# Patient Record
Sex: Female | Born: 1985 | Race: White | Hispanic: No | Marital: Single | Smoking: Current every day smoker
Health system: Southern US, Community
[De-identification: ages and names within clinical notes are randomized; demographics above are authoritative.]

## PROBLEM LIST (undated history)

## (undated) ENCOUNTER — Inpatient Hospital Stay (HOSPITAL_COMMUNITY): Payer: Self-pay

## (undated) DIAGNOSIS — M549 Dorsalgia, unspecified: Secondary | ICD-10-CM

## (undated) DIAGNOSIS — F419 Anxiety disorder, unspecified: Secondary | ICD-10-CM

## (undated) DIAGNOSIS — K2270 Barrett's esophagus without dysplasia: Secondary | ICD-10-CM

## (undated) DIAGNOSIS — E0500 Thyrotoxicosis with diffuse goiter without thyrotoxic crisis or storm: Secondary | ICD-10-CM

## (undated) DIAGNOSIS — F988 Other specified behavioral and emotional disorders with onset usually occurring in childhood and adolescence: Secondary | ICD-10-CM

## (undated) DIAGNOSIS — G43909 Migraine, unspecified, not intractable, without status migrainosus: Secondary | ICD-10-CM

## (undated) DIAGNOSIS — Z87442 Personal history of urinary calculi: Secondary | ICD-10-CM

## (undated) DIAGNOSIS — N12 Tubulo-interstitial nephritis, not specified as acute or chronic: Secondary | ICD-10-CM

## (undated) DIAGNOSIS — G8929 Other chronic pain: Secondary | ICD-10-CM

## (undated) DIAGNOSIS — D219 Benign neoplasm of connective and other soft tissue, unspecified: Secondary | ICD-10-CM

## (undated) DIAGNOSIS — F319 Bipolar disorder, unspecified: Secondary | ICD-10-CM

## (undated) DIAGNOSIS — K219 Gastro-esophageal reflux disease without esophagitis: Secondary | ICD-10-CM

## (undated) DIAGNOSIS — N3010 Interstitial cystitis (chronic) without hematuria: Secondary | ICD-10-CM

## (undated) HISTORY — PX: TONSILLECTOMY: SUR1361

## (undated) HISTORY — DX: Gastro-esophageal reflux disease without esophagitis: K21.9

## (undated) HISTORY — DX: Anxiety disorder, unspecified: F41.9

## (undated) HISTORY — DX: Personal history of urinary calculi: Z87.442

## (undated) HISTORY — PX: WISDOM TOOTH EXTRACTION: SHX21

## (undated) HISTORY — PX: TEAR DUCT PROBING: SHX793

## (undated) HISTORY — PX: ADENOIDECTOMY: SUR15

## (undated) HISTORY — DX: Thyrotoxicosis with diffuse goiter without thyrotoxic crisis or storm: E05.00

## (undated) HISTORY — DX: Bipolar disorder, unspecified: F31.9

## (undated) HISTORY — DX: Tubulo-interstitial nephritis, not specified as acute or chronic: N12

---
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
## 2005-04-13 ENCOUNTER — Other Ambulatory Visit: Admission: RE | Admit: 2005-04-13 | Discharge: 2005-04-13 | Payer: Self-pay | Admitting: Obstetrics and Gynecology

## 2005-07-09 ENCOUNTER — Ambulatory Visit (HOSPITAL_COMMUNITY): Admission: RE | Admit: 2005-07-09 | Discharge: 2005-07-09 | Payer: Self-pay | Admitting: *Deleted

## 2006-02-09 ENCOUNTER — Ambulatory Visit: Payer: Self-pay | Admitting: Family Medicine

## 2006-04-07 ENCOUNTER — Ambulatory Visit: Payer: Self-pay | Admitting: Family Medicine

## 2006-07-07 ENCOUNTER — Ambulatory Visit: Payer: Self-pay | Admitting: Family Medicine

## 2006-07-07 LAB — CONVERTED CEMR LAB
ALT: 17 units/L (ref 0–40)
AST: 18 units/L (ref 0–37)
Albumin: 3.7 g/dL (ref 3.5–5.2)
Alkaline Phosphatase: 40 units/L (ref 39–117)
BUN: 7 mg/dL (ref 6–23)
Basophils Absolute: 0 10*3/uL (ref 0.0–0.1)
Basophils Relative: 0.3 % (ref 0.0–1.0)
CO2: 29 meq/L (ref 19–32)
Calcium: 9.2 mg/dL (ref 8.4–10.5)
Chloride: 104 meq/L (ref 96–112)
Creatinine, Ser: 0.9 mg/dL (ref 0.4–1.2)
Eosinophils Absolute: 0.3 10*3/uL (ref 0.0–0.6)
Eosinophils Relative: 3.8 % (ref 0.0–5.0)
GFR calc Af Amer: 103 mL/min
GFR calc non Af Amer: 85 mL/min
Glucose, Bld: 87 mg/dL (ref 70–99)
HCT: 38.2 % (ref 36.0–46.0)
Hemoglobin: 12.8 g/dL (ref 12.0–15.0)
Lymphocytes Relative: 26.6 % (ref 12.0–46.0)
MCHC: 33.6 g/dL (ref 30.0–36.0)
MCV: 83.3 fL (ref 78.0–100.0)
Monocytes Absolute: 0.7 10*3/uL (ref 0.2–0.7)
Monocytes Relative: 8.3 % (ref 3.0–11.0)
Neutro Abs: 5 10*3/uL (ref 1.4–7.7)
Neutrophils Relative %: 61 % (ref 43.0–77.0)
Platelets: 182 10*3/uL (ref 150–400)
Potassium: 4.1 meq/L (ref 3.5–5.1)
RBC: 4.58 M/uL (ref 3.87–5.11)
RDW: 11.9 % (ref 11.5–14.6)
Sodium: 138 meq/L (ref 135–145)
Total Bilirubin: 0.5 mg/dL (ref 0.3–1.2)
Total Protein: 6.6 g/dL (ref 6.0–8.3)
WBC: 8.2 10*3/uL (ref 4.5–10.5)

## 2006-08-04 ENCOUNTER — Encounter: Admission: RE | Admit: 2006-08-04 | Discharge: 2006-08-04 | Payer: Self-pay | Admitting: Family Medicine

## 2006-11-30 ENCOUNTER — Emergency Department (HOSPITAL_COMMUNITY): Admission: EM | Admit: 2006-11-30 | Discharge: 2006-11-30 | Payer: Self-pay | Admitting: Emergency Medicine

## 2006-11-30 ENCOUNTER — Encounter: Payer: Self-pay | Admitting: Family Medicine

## 2006-12-06 ENCOUNTER — Ambulatory Visit: Payer: Self-pay | Admitting: Family Medicine

## 2006-12-06 DIAGNOSIS — H60509 Unspecified acute noninfective otitis externa, unspecified ear: Secondary | ICD-10-CM

## 2006-12-06 DIAGNOSIS — M549 Dorsalgia, unspecified: Secondary | ICD-10-CM | POA: Insufficient documentation

## 2006-12-06 DIAGNOSIS — K219 Gastro-esophageal reflux disease without esophagitis: Secondary | ICD-10-CM

## 2006-12-06 LAB — CONVERTED CEMR LAB
Blood in Urine, dipstick: NEGATIVE
Glucose, Urine, Semiquant: NEGATIVE
Ketones, urine, test strip: NEGATIVE
Nitrite: NEGATIVE
Protein, U semiquant: NEGATIVE
Specific Gravity, Urine: 1.02
WBC Urine, dipstick: NEGATIVE
pH: 8

## 2006-12-07 ENCOUNTER — Encounter: Payer: Self-pay | Admitting: Family Medicine

## 2006-12-13 ENCOUNTER — Encounter (INDEPENDENT_AMBULATORY_CARE_PROVIDER_SITE_OTHER): Payer: Self-pay | Admitting: *Deleted

## 2007-01-13 ENCOUNTER — Encounter: Payer: Self-pay | Admitting: Family Medicine

## 2007-02-11 ENCOUNTER — Ambulatory Visit: Payer: Self-pay | Admitting: Internal Medicine

## 2007-02-11 ENCOUNTER — Inpatient Hospital Stay (HOSPITAL_COMMUNITY): Admission: EM | Admit: 2007-02-11 | Discharge: 2007-02-12 | Payer: Self-pay | Admitting: Emergency Medicine

## 2007-02-14 ENCOUNTER — Telehealth (INDEPENDENT_AMBULATORY_CARE_PROVIDER_SITE_OTHER): Payer: Self-pay | Admitting: *Deleted

## 2007-02-17 ENCOUNTER — Ambulatory Visit: Payer: Self-pay | Admitting: Family Medicine

## 2007-02-17 DIAGNOSIS — R5383 Other fatigue: Secondary | ICD-10-CM

## 2007-02-17 DIAGNOSIS — N12 Tubulo-interstitial nephritis, not specified as acute or chronic: Secondary | ICD-10-CM | POA: Insufficient documentation

## 2007-02-17 DIAGNOSIS — R5381 Other malaise: Secondary | ICD-10-CM | POA: Insufficient documentation

## 2007-02-17 DIAGNOSIS — J069 Acute upper respiratory infection, unspecified: Secondary | ICD-10-CM | POA: Insufficient documentation

## 2007-02-21 LAB — CONVERTED CEMR LAB
Alkaline Phosphatase: 33 units/L — ABNORMAL LOW (ref 39–117)
Anti Nuclear Antibody(ANA): NEGATIVE
BUN: 6 mg/dL (ref 6–23)
Basophils Relative: 0.1 % (ref 0.0–1.0)
Bilirubin, Direct: 0.1 mg/dL (ref 0.0–0.3)
CO2: 28 meq/L (ref 19–32)
Creatinine, Ser: 0.8 mg/dL (ref 0.4–1.2)
GFR calc Af Amer: 118 mL/min
Glucose, Bld: 92 mg/dL (ref 70–99)
HCT: 38.4 % (ref 36.0–46.0)
Hemoglobin: 12.6 g/dL (ref 12.0–15.0)
Lymphocytes Relative: 26 % (ref 12.0–46.0)
Monocytes Absolute: 0.6 10*3/uL (ref 0.2–0.7)
Monocytes Relative: 7.8 % (ref 3.0–11.0)
Neutro Abs: 5 10*3/uL (ref 1.4–7.7)
Neutrophils Relative %: 62.3 % (ref 43.0–77.0)
Potassium: 3.7 meq/L (ref 3.5–5.1)
RDW: 11.7 % (ref 11.5–14.6)
Sodium: 137 meq/L (ref 135–145)
TSH: 0.27 microintl units/mL — ABNORMAL LOW (ref 0.35–5.50)
Total Bilirubin: 0.5 mg/dL (ref 0.3–1.2)
Total Protein: 6.7 g/dL (ref 6.0–8.3)

## 2007-02-23 ENCOUNTER — Ambulatory Visit: Payer: Self-pay | Admitting: Family Medicine

## 2007-02-24 ENCOUNTER — Encounter (INDEPENDENT_AMBULATORY_CARE_PROVIDER_SITE_OTHER): Payer: Self-pay | Admitting: *Deleted

## 2007-02-28 ENCOUNTER — Encounter: Payer: Self-pay | Admitting: Family Medicine

## 2007-03-06 ENCOUNTER — Ambulatory Visit: Payer: Self-pay | Admitting: Family Medicine

## 2007-03-06 DIAGNOSIS — E0500 Thyrotoxicosis with diffuse goiter without thyrotoxic crisis or storm: Secondary | ICD-10-CM | POA: Insufficient documentation

## 2007-03-07 ENCOUNTER — Encounter (INDEPENDENT_AMBULATORY_CARE_PROVIDER_SITE_OTHER): Payer: Self-pay | Admitting: *Deleted

## 2007-03-07 LAB — CONVERTED CEMR LAB: Free T4: 1.1 ng/dL (ref 0.6–1.6)

## 2007-03-08 ENCOUNTER — Ambulatory Visit: Payer: Self-pay | Admitting: Family Medicine

## 2007-03-08 ENCOUNTER — Ambulatory Visit (HOSPITAL_COMMUNITY): Admission: RE | Admit: 2007-03-08 | Discharge: 2007-03-08 | Payer: Self-pay | Admitting: Family Medicine

## 2007-03-08 ENCOUNTER — Telehealth (INDEPENDENT_AMBULATORY_CARE_PROVIDER_SITE_OTHER): Payer: Self-pay | Admitting: *Deleted

## 2007-03-08 DIAGNOSIS — R1011 Right upper quadrant pain: Secondary | ICD-10-CM | POA: Insufficient documentation

## 2007-03-08 DIAGNOSIS — R109 Unspecified abdominal pain: Secondary | ICD-10-CM | POA: Insufficient documentation

## 2007-03-08 LAB — CONVERTED CEMR LAB
Glucose, Urine, Semiquant: NEGATIVE
Protein, U semiquant: NEGATIVE
Urobilinogen, UA: NEGATIVE
pH: 6

## 2007-03-09 ENCOUNTER — Telehealth: Payer: Self-pay | Admitting: Family Medicine

## 2007-03-13 ENCOUNTER — Telehealth: Payer: Self-pay | Admitting: Family Medicine

## 2007-03-16 ENCOUNTER — Encounter: Payer: Self-pay | Admitting: Family Medicine

## 2007-03-21 ENCOUNTER — Ambulatory Visit (HOSPITAL_COMMUNITY): Admission: RE | Admit: 2007-03-21 | Discharge: 2007-03-21 | Payer: Self-pay | Admitting: Specialist

## 2007-03-27 ENCOUNTER — Telehealth (INDEPENDENT_AMBULATORY_CARE_PROVIDER_SITE_OTHER): Payer: Self-pay | Admitting: *Deleted

## 2007-03-29 ENCOUNTER — Encounter (INDEPENDENT_AMBULATORY_CARE_PROVIDER_SITE_OTHER): Payer: Self-pay | Admitting: Family Medicine

## 2007-04-04 ENCOUNTER — Encounter: Payer: Self-pay | Admitting: Family Medicine

## 2007-05-02 ENCOUNTER — Ambulatory Visit: Payer: Self-pay | Admitting: Family Medicine

## 2007-05-18 ENCOUNTER — Encounter
Admission: RE | Admit: 2007-05-18 | Discharge: 2007-05-18 | Payer: Self-pay | Admitting: Physical Medicine and Rehabilitation

## 2007-07-06 ENCOUNTER — Ambulatory Visit: Payer: Self-pay | Admitting: Internal Medicine

## 2007-07-07 ENCOUNTER — Ambulatory Visit: Payer: Self-pay | Admitting: Internal Medicine

## 2007-07-07 LAB — CONVERTED CEMR LAB
Basophils Absolute: 0 10*3/uL (ref 0.0–0.1)
Basophils Relative: 0.4 % (ref 0.0–1.0)
CO2: 29 meq/L (ref 19–32)
Creatinine, Ser: 1 mg/dL (ref 0.4–1.2)
GFR calc Af Amer: 90 mL/min
HCT: 40.2 % (ref 36.0–46.0)
Hemoglobin: 13.7 g/dL (ref 12.0–15.0)
Lymphocytes Relative: 37.9 % (ref 12.0–46.0)
MCHC: 34.1 g/dL (ref 30.0–36.0)
Monocytes Absolute: 1.1 10*3/uL — ABNORMAL HIGH (ref 0.2–0.7)
Neutro Abs: 4.5 10*3/uL (ref 1.4–7.7)
Neutrophils Relative %: 47.5 % (ref 43.0–77.0)
Potassium: 3.5 meq/L (ref 3.5–5.1)
RDW: 11.8 % (ref 11.5–14.6)
Sodium: 138 meq/L (ref 135–145)

## 2007-07-08 ENCOUNTER — Emergency Department (HOSPITAL_COMMUNITY): Admission: EM | Admit: 2007-07-08 | Discharge: 2007-07-09 | Payer: Self-pay | Admitting: Emergency Medicine

## 2007-07-10 ENCOUNTER — Encounter (INDEPENDENT_AMBULATORY_CARE_PROVIDER_SITE_OTHER): Payer: Self-pay | Admitting: *Deleted

## 2007-07-31 ENCOUNTER — Emergency Department (HOSPITAL_COMMUNITY): Admission: EM | Admit: 2007-07-31 | Discharge: 2007-07-31 | Payer: Self-pay | Admitting: Emergency Medicine

## 2007-08-18 ENCOUNTER — Emergency Department (HOSPITAL_COMMUNITY): Admission: EM | Admit: 2007-08-18 | Discharge: 2007-08-18 | Payer: Self-pay | Admitting: Family Medicine

## 2007-08-18 ENCOUNTER — Telehealth (INDEPENDENT_AMBULATORY_CARE_PROVIDER_SITE_OTHER): Payer: Self-pay | Admitting: *Deleted

## 2007-11-08 ENCOUNTER — Emergency Department (HOSPITAL_COMMUNITY): Admission: EM | Admit: 2007-11-08 | Discharge: 2007-11-09 | Payer: Self-pay | Admitting: Emergency Medicine

## 2007-11-09 ENCOUNTER — Telehealth (INDEPENDENT_AMBULATORY_CARE_PROVIDER_SITE_OTHER): Payer: Self-pay | Admitting: *Deleted

## 2007-12-26 ENCOUNTER — Encounter
Admission: RE | Admit: 2007-12-26 | Discharge: 2007-12-26 | Payer: Self-pay | Admitting: Physical Medicine and Rehabilitation

## 2008-01-07 ENCOUNTER — Emergency Department (HOSPITAL_COMMUNITY): Admission: EM | Admit: 2008-01-07 | Discharge: 2008-01-07 | Payer: Self-pay | Admitting: Emergency Medicine

## 2008-03-08 ENCOUNTER — Emergency Department (HOSPITAL_COMMUNITY): Admission: EM | Admit: 2008-03-08 | Discharge: 2008-03-08 | Payer: Self-pay | Admitting: Emergency Medicine

## 2008-03-19 ENCOUNTER — Encounter: Payer: Self-pay | Admitting: Gastroenterology

## 2008-03-24 ENCOUNTER — Emergency Department (HOSPITAL_COMMUNITY): Admission: EM | Admit: 2008-03-24 | Discharge: 2008-03-24 | Payer: Self-pay | Admitting: Emergency Medicine

## 2008-03-29 ENCOUNTER — Encounter: Payer: Self-pay | Admitting: Gastroenterology

## 2008-03-29 ENCOUNTER — Ambulatory Visit (HOSPITAL_COMMUNITY): Admission: RE | Admit: 2008-03-29 | Discharge: 2008-03-29 | Payer: Self-pay | Admitting: Gastroenterology

## 2008-03-29 ENCOUNTER — Encounter (INDEPENDENT_AMBULATORY_CARE_PROVIDER_SITE_OTHER): Payer: Self-pay | Admitting: Gastroenterology

## 2008-04-04 ENCOUNTER — Encounter: Payer: Self-pay | Admitting: Gastroenterology

## 2008-04-04 DIAGNOSIS — R933 Abnormal findings on diagnostic imaging of other parts of digestive tract: Secondary | ICD-10-CM | POA: Insufficient documentation

## 2008-04-05 ENCOUNTER — Ambulatory Visit (HOSPITAL_COMMUNITY): Admission: RE | Admit: 2008-04-05 | Discharge: 2008-04-05 | Payer: Self-pay | Admitting: Neurology

## 2008-05-02 ENCOUNTER — Ambulatory Visit: Payer: Self-pay | Admitting: Gastroenterology

## 2008-05-02 ENCOUNTER — Ambulatory Visit (HOSPITAL_COMMUNITY): Admission: RE | Admit: 2008-05-02 | Discharge: 2008-05-02 | Payer: Self-pay | Admitting: Gastroenterology

## 2008-05-28 ENCOUNTER — Ambulatory Visit: Payer: Self-pay | Admitting: Family Medicine

## 2008-05-28 ENCOUNTER — Telehealth (INDEPENDENT_AMBULATORY_CARE_PROVIDER_SITE_OTHER): Payer: Self-pay | Admitting: *Deleted

## 2008-05-28 ENCOUNTER — Ambulatory Visit (HOSPITAL_COMMUNITY): Admission: RE | Admit: 2008-05-28 | Discharge: 2008-05-28 | Payer: Self-pay | Admitting: Family Medicine

## 2008-05-28 DIAGNOSIS — Z87442 Personal history of urinary calculi: Secondary | ICD-10-CM | POA: Insufficient documentation

## 2008-05-28 DIAGNOSIS — R319 Hematuria, unspecified: Secondary | ICD-10-CM

## 2008-05-28 LAB — CONVERTED CEMR LAB: Bilirubin Urine: NEGATIVE

## 2008-05-31 ENCOUNTER — Telehealth (INDEPENDENT_AMBULATORY_CARE_PROVIDER_SITE_OTHER): Payer: Self-pay | Admitting: *Deleted

## 2008-05-31 ENCOUNTER — Ambulatory Visit: Payer: Self-pay | Admitting: Cardiology

## 2008-06-02 ENCOUNTER — Emergency Department (HOSPITAL_COMMUNITY): Admission: EM | Admit: 2008-06-02 | Discharge: 2008-06-02 | Payer: Self-pay | Admitting: Emergency Medicine

## 2008-06-02 ENCOUNTER — Telehealth: Payer: Self-pay | Admitting: Family Medicine

## 2008-06-03 ENCOUNTER — Telehealth: Payer: Self-pay | Admitting: Internal Medicine

## 2008-06-13 ENCOUNTER — Encounter: Payer: Self-pay | Admitting: Family Medicine

## 2008-06-13 ENCOUNTER — Encounter: Admission: RE | Admit: 2008-06-13 | Discharge: 2008-06-13 | Payer: Self-pay | Admitting: Gastroenterology

## 2008-06-17 ENCOUNTER — Emergency Department (HOSPITAL_COMMUNITY): Admission: EM | Admit: 2008-06-17 | Discharge: 2008-06-18 | Payer: Self-pay | Admitting: Emergency Medicine

## 2008-06-20 ENCOUNTER — Ambulatory Visit (HOSPITAL_COMMUNITY): Admission: RE | Admit: 2008-06-20 | Discharge: 2008-06-20 | Payer: Self-pay | Admitting: Gastroenterology

## 2008-06-30 ENCOUNTER — Emergency Department (HOSPITAL_COMMUNITY): Admission: EM | Admit: 2008-06-30 | Discharge: 2008-06-30 | Payer: Self-pay | Admitting: Emergency Medicine

## 2008-07-17 ENCOUNTER — Telehealth (INDEPENDENT_AMBULATORY_CARE_PROVIDER_SITE_OTHER): Payer: Self-pay | Admitting: *Deleted

## 2008-08-13 ENCOUNTER — Emergency Department (HOSPITAL_COMMUNITY): Admission: EM | Admit: 2008-08-13 | Discharge: 2008-08-13 | Payer: Self-pay | Admitting: Emergency Medicine

## 2008-08-19 ENCOUNTER — Emergency Department (HOSPITAL_COMMUNITY): Admission: EM | Admit: 2008-08-19 | Discharge: 2008-08-19 | Payer: Self-pay | Admitting: Emergency Medicine

## 2008-08-21 ENCOUNTER — Emergency Department (HOSPITAL_COMMUNITY): Admission: EM | Admit: 2008-08-21 | Discharge: 2008-08-21 | Payer: Self-pay | Admitting: Emergency Medicine

## 2008-09-21 ENCOUNTER — Emergency Department (HOSPITAL_COMMUNITY): Admission: EM | Admit: 2008-09-21 | Discharge: 2008-09-21 | Payer: Self-pay | Admitting: Emergency Medicine

## 2008-10-10 ENCOUNTER — Ambulatory Visit: Payer: Self-pay | Admitting: Family Medicine

## 2008-10-10 ENCOUNTER — Ambulatory Visit: Payer: Self-pay | Admitting: Internal Medicine

## 2008-10-10 ENCOUNTER — Telehealth (INDEPENDENT_AMBULATORY_CARE_PROVIDER_SITE_OTHER): Payer: Self-pay | Admitting: *Deleted

## 2008-10-10 DIAGNOSIS — R599 Enlarged lymph nodes, unspecified: Secondary | ICD-10-CM | POA: Insufficient documentation

## 2008-10-10 LAB — CONVERTED CEMR LAB
Basophils Relative: 0 % (ref 0.0–3.0)
Eosinophils Absolute: 0.6 10*3/uL (ref 0.0–0.7)
HCT: 37.8 % (ref 36.0–46.0)
Hemoglobin: 12.7 g/dL (ref 12.0–15.0)
Lymphs Abs: 2.5 10*3/uL (ref 0.7–4.0)
MCHC: 33.5 g/dL (ref 30.0–36.0)
MCV: 85.7 fL (ref 78.0–100.0)
Monocytes Absolute: 0.7 10*3/uL (ref 0.1–1.0)
Neutro Abs: 3.6 10*3/uL (ref 1.4–7.7)
RBC: 4.41 M/uL (ref 3.87–5.11)

## 2008-10-14 ENCOUNTER — Telehealth: Payer: Self-pay | Admitting: Family Medicine

## 2008-10-16 ENCOUNTER — Telehealth: Payer: Self-pay | Admitting: Family Medicine

## 2008-10-16 ENCOUNTER — Emergency Department (HOSPITAL_BASED_OUTPATIENT_CLINIC_OR_DEPARTMENT_OTHER): Admission: EM | Admit: 2008-10-16 | Discharge: 2008-10-16 | Payer: Self-pay | Admitting: Emergency Medicine

## 2008-10-16 ENCOUNTER — Ambulatory Visit: Payer: Self-pay | Admitting: Family Medicine

## 2008-10-16 DIAGNOSIS — R112 Nausea with vomiting, unspecified: Secondary | ICD-10-CM

## 2008-10-16 DIAGNOSIS — N390 Urinary tract infection, site not specified: Secondary | ICD-10-CM | POA: Insufficient documentation

## 2008-10-16 LAB — CONVERTED CEMR LAB
ALT: 12 units/L (ref 0–35)
Amylase: 88 units/L (ref 27–131)
BUN: 8 mg/dL (ref 6–23)
Basophils Relative: 0.1 % (ref 0.0–3.0)
Beta hcg, urine, semiquantitative: NEGATIVE
Bilirubin Urine: NEGATIVE
CO2: 31 meq/L (ref 19–32)
Chloride: 104 meq/L (ref 96–112)
Creatinine, Ser: 0.8 mg/dL (ref 0.4–1.2)
Eosinophils Absolute: 0.3 10*3/uL (ref 0.0–0.7)
Eosinophils Relative: 3.4 % (ref 0.0–5.0)
Glucose, Urine, Semiquant: NEGATIVE
HCT: 38.7 % (ref 36.0–46.0)
Ketones, urine, test strip: NEGATIVE
Lipase: 17 units/L (ref 11.0–59.0)
Lymphs Abs: 3 10*3/uL (ref 0.7–4.0)
MCHC: 33.5 g/dL (ref 30.0–36.0)
MCV: 85.6 fL (ref 78.0–100.0)
Monocytes Absolute: 0.6 10*3/uL (ref 0.1–1.0)
Nitrite: POSITIVE
Platelets: 239 10*3/uL (ref 150.0–400.0)
Potassium: 4.1 meq/L (ref 3.5–5.1)
Protein, U semiquant: 30
RBC: 4.52 M/uL (ref 3.87–5.11)
Specific Gravity, Urine: 1.02
TSH: 0.82 microintl units/mL (ref 0.35–5.50)
Total Protein: 7 g/dL (ref 6.0–8.3)
Urobilinogen, UA: NEGATIVE
WBC Urine, dipstick: NEGATIVE
WBC: 9.3 10*3/uL (ref 4.5–10.5)
pH: 6

## 2008-10-17 ENCOUNTER — Encounter: Payer: Self-pay | Admitting: Family Medicine

## 2008-10-29 ENCOUNTER — Ambulatory Visit: Payer: Self-pay | Admitting: Family Medicine

## 2008-10-29 LAB — CONVERTED CEMR LAB
Nitrite: NEGATIVE
Urobilinogen, UA: NEGATIVE
WBC Urine, dipstick: NEGATIVE

## 2008-10-31 ENCOUNTER — Encounter (INDEPENDENT_AMBULATORY_CARE_PROVIDER_SITE_OTHER): Payer: Self-pay | Admitting: *Deleted

## 2008-11-22 ENCOUNTER — Ambulatory Visit: Payer: Self-pay | Admitting: Family Medicine

## 2008-11-22 LAB — CONVERTED CEMR LAB
Bilirubin Urine: NEGATIVE
Nitrite: NEGATIVE
Urobilinogen, UA: NEGATIVE
WBC Urine, dipstick: NEGATIVE

## 2008-11-25 ENCOUNTER — Encounter: Payer: Self-pay | Admitting: Family Medicine

## 2008-11-28 ENCOUNTER — Encounter (INDEPENDENT_AMBULATORY_CARE_PROVIDER_SITE_OTHER): Payer: Self-pay | Admitting: *Deleted

## 2008-12-05 ENCOUNTER — Ambulatory Visit: Payer: Self-pay | Admitting: Family Medicine

## 2008-12-05 LAB — CONVERTED CEMR LAB
Glucose, Urine, Semiquant: NEGATIVE
Urobilinogen, UA: NEGATIVE
WBC Urine, dipstick: NEGATIVE
pH: 6.5

## 2008-12-20 ENCOUNTER — Emergency Department (HOSPITAL_COMMUNITY): Admission: EM | Admit: 2008-12-20 | Discharge: 2008-12-20 | Payer: Self-pay | Admitting: Emergency Medicine

## 2009-01-02 ENCOUNTER — Ambulatory Visit: Payer: Self-pay | Admitting: Family Medicine

## 2009-01-02 LAB — CONVERTED CEMR LAB
Ketones, urine, test strip: NEGATIVE
Nitrite: NEGATIVE
Specific Gravity, Urine: 1.02
pH: 6.5

## 2009-02-16 ENCOUNTER — Emergency Department (HOSPITAL_COMMUNITY): Admission: EM | Admit: 2009-02-16 | Discharge: 2009-02-16 | Payer: Self-pay | Admitting: Emergency Medicine

## 2009-02-21 ENCOUNTER — Ambulatory Visit: Payer: Self-pay | Admitting: Family Medicine

## 2009-02-21 DIAGNOSIS — M25569 Pain in unspecified knee: Secondary | ICD-10-CM

## 2009-02-21 DIAGNOSIS — M25579 Pain in unspecified ankle and joints of unspecified foot: Secondary | ICD-10-CM | POA: Insufficient documentation

## 2009-02-21 DIAGNOSIS — M79609 Pain in unspecified limb: Secondary | ICD-10-CM

## 2009-02-22 ENCOUNTER — Emergency Department (HOSPITAL_COMMUNITY): Admission: EM | Admit: 2009-02-22 | Discharge: 2009-02-22 | Payer: Self-pay | Admitting: Emergency Medicine

## 2009-02-25 ENCOUNTER — Telehealth: Payer: Self-pay | Admitting: Family Medicine

## 2009-03-11 ENCOUNTER — Telehealth (INDEPENDENT_AMBULATORY_CARE_PROVIDER_SITE_OTHER): Payer: Self-pay | Admitting: *Deleted

## 2009-03-24 ENCOUNTER — Ambulatory Visit: Payer: Self-pay | Admitting: Family Medicine

## 2009-03-24 LAB — CONVERTED CEMR LAB
Bilirubin Urine: NEGATIVE
Specific Gravity, Urine: 1.015
Urobilinogen, UA: 0.2

## 2009-03-25 LAB — CONVERTED CEMR LAB

## 2009-03-26 ENCOUNTER — Encounter: Payer: Self-pay | Admitting: Family Medicine

## 2009-03-26 LAB — CONVERTED CEMR LAB
Basophils Relative: 0 % (ref 0.0–3.0)
Calcium: 9.8 mg/dL (ref 8.4–10.5)
GFR calc non Af Amer: 94.4 mL/min (ref >60)
Hemoglobin: 13.7 g/dL (ref 12.0–15.0)
Lymphocytes Relative: 17.5 % (ref 12.0–46.0)
Monocytes Relative: 2.6 % — ABNORMAL LOW (ref 3.0–12.0)
Neutro Abs: 7 10*3/uL (ref 1.4–7.7)
RBC: 5.02 M/uL (ref 3.87–5.11)
Sodium: 140 meq/L (ref 135–145)

## 2009-06-17 ENCOUNTER — Emergency Department (HOSPITAL_COMMUNITY): Admission: EM | Admit: 2009-06-17 | Discharge: 2009-06-17 | Payer: Self-pay | Admitting: Emergency Medicine

## 2009-06-17 ENCOUNTER — Telehealth (INDEPENDENT_AMBULATORY_CARE_PROVIDER_SITE_OTHER): Payer: Self-pay | Admitting: *Deleted

## 2009-08-24 ENCOUNTER — Emergency Department (HOSPITAL_COMMUNITY): Admission: EM | Admit: 2009-08-24 | Discharge: 2009-08-24 | Payer: Self-pay | Admitting: Emergency Medicine

## 2009-09-23 ENCOUNTER — Ambulatory Visit: Payer: Self-pay | Admitting: Family Medicine

## 2009-09-23 DIAGNOSIS — F411 Generalized anxiety disorder: Secondary | ICD-10-CM | POA: Insufficient documentation

## 2009-09-29 ENCOUNTER — Telehealth: Payer: Self-pay | Admitting: Family Medicine

## 2009-10-02 ENCOUNTER — Ambulatory Visit: Payer: Self-pay | Admitting: Family Medicine

## 2009-10-02 DIAGNOSIS — M479 Spondylosis, unspecified: Secondary | ICD-10-CM | POA: Insufficient documentation

## 2009-10-07 ENCOUNTER — Ambulatory Visit (HOSPITAL_BASED_OUTPATIENT_CLINIC_OR_DEPARTMENT_OTHER): Admission: RE | Admit: 2009-10-07 | Discharge: 2009-10-07 | Payer: Self-pay | Admitting: Urology

## 2009-11-28 ENCOUNTER — Ambulatory Visit: Payer: Self-pay | Admitting: Family Medicine

## 2009-11-28 DIAGNOSIS — F39 Unspecified mood [affective] disorder: Secondary | ICD-10-CM

## 2009-12-04 ENCOUNTER — Encounter (INDEPENDENT_AMBULATORY_CARE_PROVIDER_SITE_OTHER): Payer: Self-pay | Admitting: *Deleted

## 2009-12-04 ENCOUNTER — Ambulatory Visit: Payer: Self-pay | Admitting: Family Medicine

## 2009-12-04 DIAGNOSIS — H5710 Ocular pain, unspecified eye: Secondary | ICD-10-CM | POA: Insufficient documentation

## 2009-12-04 LAB — CONVERTED CEMR LAB
Basophils Absolute: 0.1 10*3/uL (ref 0.0–0.1)
HCT: 37.3 % (ref 36.0–46.0)
Hemoglobin: 12.2 g/dL (ref 12.0–15.0)
Lymphs Abs: 2.8 10*3/uL (ref 0.7–4.0)
MCHC: 32.8 g/dL (ref 30.0–36.0)
Monocytes Relative: 6.4 % (ref 3.0–12.0)
Neutro Abs: 7 10*3/uL (ref 1.4–7.7)
RDW: 15.1 % — ABNORMAL HIGH (ref 11.5–14.6)

## 2010-01-05 ENCOUNTER — Ambulatory Visit: Payer: Self-pay | Admitting: Family Medicine

## 2010-02-10 ENCOUNTER — Ambulatory Visit: Payer: Self-pay | Admitting: Family Medicine

## 2010-02-10 ENCOUNTER — Telehealth (INDEPENDENT_AMBULATORY_CARE_PROVIDER_SITE_OTHER): Payer: Self-pay | Admitting: *Deleted

## 2010-02-10 DIAGNOSIS — S6720XA Crushing injury of unspecified hand, initial encounter: Secondary | ICD-10-CM | POA: Insufficient documentation

## 2010-02-11 ENCOUNTER — Encounter: Payer: Self-pay | Admitting: Family Medicine

## 2010-02-23 ENCOUNTER — Ambulatory Visit: Payer: Self-pay | Admitting: Family Medicine

## 2010-02-24 ENCOUNTER — Telehealth: Payer: Self-pay | Admitting: Family Medicine

## 2010-04-07 ENCOUNTER — Ambulatory Visit: Payer: Self-pay | Admitting: Diagnostic Radiology

## 2010-04-07 ENCOUNTER — Ambulatory Visit: Payer: Self-pay | Admitting: Family Medicine

## 2010-04-07 ENCOUNTER — Ambulatory Visit (HOSPITAL_BASED_OUTPATIENT_CLINIC_OR_DEPARTMENT_OTHER): Admission: RE | Admit: 2010-04-07 | Discharge: 2010-04-07 | Payer: Self-pay | Admitting: Family Medicine

## 2010-04-30 ENCOUNTER — Ambulatory Visit: Payer: Self-pay | Admitting: Family Medicine

## 2010-04-30 ENCOUNTER — Telehealth (INDEPENDENT_AMBULATORY_CARE_PROVIDER_SITE_OTHER): Payer: Self-pay | Admitting: *Deleted

## 2010-05-01 ENCOUNTER — Ambulatory Visit: Payer: Self-pay | Admitting: Family Medicine

## 2010-05-29 ENCOUNTER — Emergency Department (HOSPITAL_COMMUNITY)
Admission: EM | Admit: 2010-05-29 | Discharge: 2010-05-29 | Payer: Self-pay | Source: Home / Self Care | Admitting: Emergency Medicine

## 2010-05-30 ENCOUNTER — Emergency Department (HOSPITAL_COMMUNITY)
Admission: EM | Admit: 2010-05-30 | Discharge: 2010-05-30 | Payer: Self-pay | Source: Home / Self Care | Admitting: Emergency Medicine

## 2010-07-05 ENCOUNTER — Encounter: Payer: Self-pay | Admitting: Emergency Medicine

## 2010-07-05 ENCOUNTER — Encounter: Payer: Self-pay | Admitting: Physical Medicine and Rehabilitation

## 2010-07-05 ENCOUNTER — Encounter: Payer: Self-pay | Admitting: General Surgery

## 2010-07-14 NOTE — Assessment & Plan Note (Signed)
Summary: PINK EYE//KN   Vital Signs:  Patient profile:   25 year old female Weight:      124 pounds Pulse rate:   97 / minute BP sitting:   100 / 70  (left arm)  Vitals Entered By: Doristine Devoid (December 04, 2009 11:48 AM) CC: L eye redness, itchy, and burning    History of Present Illness: 25 yo woman here today for L eye issues.  painful to open eye and look at the light- 'burns and stings'.  reports eye was crusted shut.  + yellow drainage this AM.  had some redness last night, + swelling.  denies bite or sting.  denies working w/ wood or metal or other foreign body.  + pain w/ pressure on the globe.  also c/o increased fatigue.  Dr Laury Axon has increased meds (abilify) but pt hasn't started this.  reports difficulty waking up.  grandmother recently dx'd w/ lung cancer.  pt aware that this is not what she made the appt for but just thought she would 'mention it'.  Current Medications (verified): 1)  Lexapro 20 Mg Tabs (Escitalopram Oxalate) .Marland Kitchen.. 1 By Mouth Once Daily 2)  Alprazolam 0.25 Mg Tabs (Alprazolam) .Marland Kitchen.. 1-2  By Mouth Three Times A Day As Needed 3)  Klonopin 1 Mg Tabs (Clonazepam) .Marland Kitchen.. 1 By Mouth Three Times A Day 4)  Abilify 5 Mg Tabs (Aripiprazole) .Marland Kitchen.. 1 By Mouth Once Daily  Allergies (verified): No Known Drug Allergies  Past History:  Past Medical History: Last updated: 05/28/2008 Gastric Reflux anxiety basedow's disease pyelonephritis, nos Nephrolithiasis, hx of  Social History: works as a Hospital doctor for [employer]  Review of Systems      See HPI  Physical Exam  General:  pt very slow in speech.  seems altered. Head:  Normocephalic and atraumatic without obvious abnormalities. No apparent alopecia or balding. Eyes:  L eye swollen, + conjunctival inflammation and injxn, + pain w/ light pressure over the globe.  + PERRL.  + photophobia.  fluoroscein test (-) for abrasion.   Impression & Recommendations:  Problem # 1:  EYE PAIN, LEFT (ICD-379.91) Assessment  New appears consistent w/ conjunctivitis but given amount of pt's pain on exam will refer to ophtho for evaluation and tx.  will not give abx at this time to ensure that pt follows up w/ eye doctor.  pt in agreement w/ further evaluation. Orders: Ophthalmology Referral (Ophthalmology)  Problem # 2:  FATIGUE (ICD-780.79) Assessment: Unchanged suspect this is related to pt's ongoing depression and anxiety- especially w/ her grandmother's dx of lung cancer.  will check labs to r/o obvious causes of fatigue (anemia, thyroid) but if labs normal will need f/u w/ PCP.  Pt expresses understanding and is in agreement w/ this plan. Orders: Venipuncture ([address]) TLB-CBC Platelet - w/Differential (85025-CBCD) TLB-TSH (Thyroid Stimulating Hormone) (84443-TSH)  Complete Medication List: 1)  Lexapro 20 Mg Tabs (Escitalopram oxalate) .Marland Kitchen.. 1 by mouth once daily 2)  Alprazolam 0.25 Mg Tabs (Alprazolam) .Marland Kitchen.. 1-2  by mouth three times a day as needed 3)  Klonopin 1 Mg Tabs (Clonazepam) .Marland Kitchen.. 1 by mouth three times a day 4)  Abilify 5 Mg Tabs (Aripiprazole) .Marland Kitchen.. 1 by mouth once daily  Patient Instructions: 1)  Please go to the eye doctor today for your very painful eye- they will give you any medicine that you need 2)  We'll notify you of your lab results- if there's no obvious cause of fatigue you will need to f/u w/ Dr  Lowne 3)  Call with any questions or concerns 4)  Hang in there!

## 2010-07-14 NOTE — Assessment & Plan Note (Signed)
Summary: 1 month followup/kn   Vital Signs:  Patient profile:   25 year old female Height:      66 inches Weight:      124 pounds Temp:     98.1 degrees F oral Pulse rate:   82 / minute BP sitting:   100 / 82  (left arm)  Vitals Entered By: Jeremy Johann CMA (January 05, 2010 2:07 PM) CC: 1 month f/u med   History of Present Illness: Pt is here f/u anxiety and mood swings.  Pt is doing better but states she is still having mood swings.    Current Medications (verified): 1)  Lexapro 20 Mg Tabs (Escitalopram Oxalate) .Marland Kitchen.. 1 By Mouth Once Daily 2)  Klonopin 1 Mg Tabs (Clonazepam) .Marland Kitchen.. 1 By Mouth Three Times A Day 3)  Abilify 10 Mg Tabs (Aripiprazole) .Marland Kitchen.. 1 By Mouth Once Daily  Allergies (verified): No Known Drug Allergies  Past History:  Past medical, surgical, family and social histories (including risk factors) reviewed for relevance to current acute and chronic problems.  Past Medical History: Reviewed history from 05/28/2008 and no changes required. Gastric Reflux anxiety basedow's disease pyelonephritis, nos Nephrolithiasis, hx of  Past Surgical History: Reviewed history from 07/06/2007 and no changes required. Tonsillectomy Ear tubes irrigation to ears  Family History: Reviewed history from 12/01/2006 and no changes required. Family History Diabetes 1st degree relative Family History Hypertension Family History Lung cancer Family History of Cardiovascular disorder  Social History: Reviewed history from 12/04/2009 and no changes required. works as a Hospital doctor for [employer]  Review of Systems      See HPI  Physical Exam  General:  Well-developed,well-nourished,in no acute distress; alert,appropriate and cooperative throughout examination Psych:  Oriented X3, normally interactive, good eye contact, not anxious appearing, not depressed appearing, not agitated, and not suicidal.     Impression & Recommendations:  Problem # 1:  ANXIETY STATE,  UNSPECIFIED (ICD-300.00)  The following medications were removed from the medication list:    Alprazolam 0.25 Mg Tabs (Alprazolam) .Marland Kitchen... 1-2  by mouth three times a day as needed Her updated medication list for this problem includes:    Lexapro 20 Mg Tabs (Escitalopram oxalate) .Marland Kitchen... 1 by mouth once daily    Klonopin 1 Mg Tabs (Clonazepam) .Marland Kitchen... 1 by mouth three times a day  Discussed medication use and relaxation techniques.   Problem # 2:  MOOD SWINGS (ICD-296.99) abilify 10 mg once daily   Complete Medication List: 1)  Lexapro 20 Mg Tabs (Escitalopram oxalate) .Marland Kitchen.. 1 by mouth once daily 2)  Klonopin 1 Mg Tabs (Clonazepam) .Marland Kitchen.. 1 by mouth three times a day 3)  Abilify 10 Mg Tabs (Aripiprazole) .Marland Kitchen.. 1 by mouth once daily  Patient Instructions: 1)  keep appointment with Dr Helane Rima next month. 2)  rto as needed  Prescriptions: KLONOPIN 1 MG TABS (CLONAZEPAM) 1 by mouth three times a day  #90 x 0   Entered and Authorized by:   Loreen Freud DO   Signed by:   Loreen Freud DO on 01/05/2010   Method used:   Print then Give to Patient   RxID:   [id number] LEXAPRO 20 MG TABS (ESCITALOPRAM OXALATE) 1 by mouth once daily  #30 x 5   Entered and Authorized by:   Loreen Freud DO   Signed by:   Loreen Freud DO on 01/05/2010   Method used:   Print then Give to Patient   RxID:   [id number] ABILIFY 10 MG TABS (ARIPIPRAZOLE)  1 by mouth once daily  #30 x 2   Entered and Authorized by:   Loreen Freud DO   Signed by:   Loreen Freud DO on 01/05/2010   Method used:   [facility] to        [facility] [phone][facility] (retail)       [address]       [place], [place]  [address]       Ph: [phone]       Fax: [phone]   RxID:   [id number]

## 2010-07-14 NOTE — Assessment & Plan Note (Signed)
Summary: wants anti-inflamatory foot pain/cbs   Vital Signs:  Patient profile:   25 year old female Weight:      120 pounds Temp:     97.9 degrees F oral Pulse rate:   88 / minute Pulse rhythm:   regular BP sitting:   108 / 60  (right arm) Cuff size:   regular  Vitals Entered By: Almeta Monas CMA Duncan Dull) (May 01, 2010 11:48 AM) CC: c/o pain in the right foot   History of Present Illness: Pt here again f/u foot pain.  Pt states she flushed her pain meds last night because she got angry.  Ortho wanted her to only take antiinflammatory.   See ortho note---scanned in.  pt describes pain as burning pain and she can not sleep.  Current Medications (verified): 1)  Prozac 20 Mg Caps (Fluoxetine Hcl) .... By Mouth Once Daily 2)  Xanax .... Unsure of The Dose 3)  Abilify 10 Mg Tabs (Aripiprazole) .Marland Kitchen.. 1 By Mouth Once Daily 4)  Crutches .... As Directed 5)  Mobic 15 Mg Tabs (Meloxicam) .Marland Kitchen.. 1 By Mouth Once Daily 6)  Neurontin 300 Mg Caps (Gabapentin) .Marland Kitchen.. 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 Family History Diabetes 1st degree relative Family History Hypertension Family History Lung cancer Family History of Cardiovascular disorder  Social History: Reviewed history from 12/04/2009 and no changes required. works as a  Hospital doctor for [employer]  Review of Systems      See HPI  Physical Exam  General:  Well-developed,well-nourished,in no acute distress; alert,appropriate and cooperative throughout examination Skin:  R foot-- + 1st degree burn from heating pad  Psych:  Oriented X3 and normally interactive.     Impression & Recommendations:  Problem # 1:  FOOT PAIN, RIGHT (ICD-729.5) con't with boot per ortho neurontin 300mg  once daily -- three times a day  mobic daily as needed f/u ortho  Complete Medication List: 1)  Prozac 20 Mg Caps (Fluoxetine hcl) .... By mouth once daily 2)  Xanax  .... Unsure of the dose 3)  Abilify 10 Mg Tabs (Aripiprazole) .Marland Kitchen.. 1 by mouth once daily 4)  Crutches  .... As directed 5)  Mobic 15 Mg Tabs (Meloxicam) .Marland Kitchen.. 1 by mouth once daily 6)  Neurontin 300 Mg Caps (Gabapentin) .Marland Kitchen.. 1 by mouth at bedtime --may increase slowly to three times a day  Patient Instructions: 1)  take mobic as needed for pain----only 1 a day 2)  start neurontin 300 mg at bedtime ---can increase to two times a day after a few days if needed---- max three times a day  Prescriptions: NEURONTIN 300 MG CAPS (GABAPENTIN) 1 by mouth at bedtime --may increase slowly to three times a day  #60 x 0   Entered and Authorized by:   Loreen Freud DO   Signed by:   Loreen Freud DO on 05/01/2010   Method used:   [facility]ron[facility] to  [facility] [phone]* (retail)       [address]       [place], [place]  [address]       Ph: [phone]       Fax: [phone]   RxID:   [id number] MOBIC 15 MG TABS (MELOXICAM) 1 by mouth once daily  #30 x 0   Entered and Authorized by:   Loreen Freud DO   Signed by:   Loreen Freud DO on 05/01/2010   Method used:   Electronically to        [facility] [phone]* (retail)       [address]       [place], [place]  [address]       Ph: [phone]       Fax: [phone]   RxID:    [id number]    Orders Added: 1)  Est. Patient Level III [[address]]

## 2010-07-14 NOTE — Assessment & Plan Note (Signed)
Summary: FOOT PAIN/CBS   Vital Signs:  Patient profile:   25 year old female Weight:      120.4 pounds Temp:     98.3 degrees F oral Pulse rate:   80 / minute Pulse rhythm:   regular BP sitting:   112 / 72  (right arm) Cuff size:   regular  Vitals Entered By: Almeta Monas CMA Duncan Dull) (April 07, 2024 11:20 AM) CC: c/o right foot pain after injury playing football x1wk ago Pain Assessment Patient in pain? yes     Location: foot Intensity: 6 Type: aching Onset of pain  Chronic-- since injury   History of Present Illness:  Injury      This is a 25 year old woman who presents with An injury.  The symptoms began 1 week ago.  Pt was playing basketball and jumped the same time as her fiance and he landed on her foot.  She has been in pain since.  The patient also reports swelling and tenderness.  The patient denies redness, increased warmth deformity, blood loss, numbness, weakness, loss of sensation, coolness of extremity, and loss of consciousness.  The patient denies the following risk factors for significant bleeding: aspirin use, anticoagulant use, and history of bleeding disorder.  Screening for risk of abuse was negative.    Current Medications (verified): 1)  Prozac 20 Mg Caps (Fluoxetine Hcl) .... By Mouth Once Daily 2)  Xanax .... Unsure of The Dose 3)  Abilify 10 Mg Tabs (Aripiprazole) .Marland Kitchen.. 1 By Mouth Once Daily 4)  Vicodin Es 7.5-750 Mg Tabs (Hydrocodone-Acetaminophen) .Marland Kitchen.. 1 By Mouth Every 6 Hours As Needed  Allergies (verified): No Known Drug Allergies  Past History:  Past medical, surgical, family and social histories (including risk factors) reviewed for relevance to current acute and chronic problems.  Past Medical History: Reviewed history from 05/28/2008 and no changes required. Gastric Reflux anxiety basedow's disease pyelonephritis, nos Nephrolithiasis, hx of  Past Surgical History: Reviewed history from 07/06/2007 and no changes  required. Tonsillectomy Ear tubes irrigation to ears  Family History: Reviewed history from 12/01/2006 and no changes required. Family History Diabetes 1st degree relative Family History Hypertension Family History Lung cancer Family History of Cardiovascular disorder  Social History: Reviewed history from 12/04/2009 and no changes required. works as a Hospital doctor for [employer]  Review of Systems      See HPI  Physical Exam  General:  Well-developed,well-nourished,in no acute distress; alert,appropriate and cooperative throughout examination Msk:  + pain R foot laterally--with palpation and weight bearing ,  little swelling ,  no ecchymosis + pedal pulses,  + post tibial pulses Psych:  Oriented X3 and normally interactive.     Impression & Recommendations:  Problem # 1:  FOOT PAIN, RIGHT (ICD-729.5) ice , elevation, rest wear ace and post op boot,  use crutches vicodin for pain ortho if no better Orders: T-Foot Right (73630TC) EMR Misc Charge Code [facility])  Complete Medication List: 1)  Prozac 20 Mg Caps (Fluoxetine hcl) .... By mouth once daily 2)  Xanax  .... Unsure of the dose 3)  Abilify 10 Mg Tabs (Aripiprazole) .Marland Kitchen.. 1 by mouth once daily 4)  Vicodin Es 7.5-750 Mg Tabs (Hydrocodone-acetaminophen) .Marland Kitchen.. 1 by mouth every 6 hours as needed 5)  Crutches  .... As directed Prescriptions: CRUTCHES as directed  #1 x 0   Entered and Authorized by:   Loreen Freud DO   Signed by:   Loreen Freud DO on 04/07/2010   Method used:  Print then Give to Patient   RxID:   [id number] VICODIN ES 7.5-750 MG TABS (HYDROCODONE-ACETAMINOPHEN) 1 by mouth every 6 hours as needed  #30 x 0   Entered and Authorized by:   Loreen Freud DO   Signed by:   Loreen Freud DO on 04/07/2010   Method used:   Print then Give to Patient   RxID:   [id number] VICODIN ES 7.5-750 MG TABS (HYDROCODONE-ACETAMINOPHEN) 1 by mouth every 6 hours as needed  #30 x 0   Entered and Authorized by:    Loreen Freud DO   Signed by:   Loreen Freud DO on 04/07/2010   Method used:   Print then Give to Patient   RxID:   [id number]    Orders Added: 1)  T-Foot Right [73630TC] 2)  Est. Patient Level III [01027] 3)  EMR Misc Charge Code [EMRMisc]

## 2010-07-14 NOTE — Progress Notes (Signed)
Summary: Ortho Referral  Phone Note Outgoing Call Call back at [facility] Phone [phone]   Caller: Patient Call placed by: Harold Barban,  February 24, 2010 8:36 AM Summary of Call: Spoke with patient to inform her the hand xray was negative and she was still wondering why her knuckle still looked out of place and wanted to see about seeing an ortho doc. Please adivse.  Initial call taken by: Harold Barban,  February 24, 2010 8:36 AM  Follow-up for Phone Call        REFERRAL PUT IN Follow-up by: Loreen Freud DO,  February 24, 2010 9:55 AM

## 2010-07-14 NOTE — Letter (Signed)
Summary: Out of Work  [facility] [employer]  [address] [place], [place] [address]   Phone: [phone]  Fax: [phone]    October 02, 2009   Employee:  [name]    To Whom It May Concern:   For Medical reasons, please excuse the above named employee from work for the following dates:  Start:   October 02, 2009  End:   October 02, 2009  If you need additional information, please feel free to contact our office.         Sincerely,    Loreen Freud DO

## 2010-07-14 NOTE — Letter (Signed)
Summary: [facility]  [facility]   Imported By: Lanelle Bal 05/11/2010 12:31:57  _____________________________________________________________________  External Attachment:    Type:   Image     Comment:   External Document

## 2010-07-14 NOTE — Letter (Signed)
Summary: Out of Work  [facility]  [address] [place], [place] [address]   Phone: [phone]  Fax: [phone]    December 04, 2009   Employee:  [name]    To Whom It May Concern:   For Medical reasons, please excuse the above named employee from work for the following dates:  Start:   12/04/09  End:   12/04/09  If you need additional information, please feel free to contact our office.         Sincerely,    Doristine Devoid

## 2010-07-14 NOTE — Assessment & Plan Note (Signed)
Summary: pain foot no better//fd   Vital Signs:  Patient profile:   25 year old female Weight:      120 pounds Pulse rate:   88 / minute BP sitting:   112 / 66  (right arm)  Vitals Entered By: Doristine Devoid CMA (April 30, 2010 2:33 PM) CC: R foot pain no better hasn't been able to walk on it    History of Present Illness: pt here c/o foot pain no better.  see last visit  Current Medications (verified): 1)  Prozac 20 Mg Caps (Fluoxetine Hcl) .... By Mouth Once Daily 2)  Xanax .... Unsure of The Dose 3)  Abilify 10 Mg Tabs (Aripiprazole) .Marland Kitchen.. 1 By Mouth Once Daily 4)  Vicodin Es 7.5-750 Mg Tabs (Hydrocodone-Acetaminophen) .Marland Kitchen.. 1 By Mouth Every 6 Hours As Needed 5)  Crutches .... 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 Family History Diabetes 1st degree relative Family History Hypertension Family History Lung cancer Family History of Cardiovascular disorder  Social History: Reviewed history from 12/04/2009 and no changes required. works as a Hospital doctor for [employer]  Review of Systems      See HPI  Physical Exam  General:  Well-developed,well-nourished,in no acute distress; alert,appropriate and cooperative throughout examination Msk:  R foot---- red from heating pad no swelling  normal pulses pain with any weight bearing or movement Psych:  Cognition and  judgment appear intact. Alert and cooperative with normal attention span and concentration. No apparent delusions, illusions, hallucinations   Impression & Recommendations:  Problem # 1:  FOOT PAIN, RIGHT (ICD-729.5) pt to see ortho today  Complete Medication List: 1)  Prozac 20 Mg Caps (Fluoxetine hcl) .... By mouth once daily 2)  Xanax  .... Unsure of the dose 3)  Abilify 10 Mg Tabs (Aripiprazole) .Marland Kitchen.. 1 by mouth once daily 4)  Vicodin Es 7.5-750 Mg Tabs (Hydrocodone-acetaminophen) .Marland Kitchen.. 1 by mouth every 6 hours as needed 5)  Crutches  .... As directed   Orders Added: 1)  Est. Patient Level III [[address]]

## 2010-07-14 NOTE — Assessment & Plan Note (Signed)
Summary: panic attcks//lch   Vital Signs:  Patient profile:   25 year old female Weight:      126 pounds Pulse rate:   86 / minute Pulse rhythm:   regular BP sitting:   122 / 80  (left arm) Cuff size:   regular  Vitals Entered By: Army Fossa CMA (October 02, 2009 12:55 PM) CC: Pt here to discuss panic attacts. she states they are not getting any better. when increasing the dose of her medicaiton it kept her awake at night. She states her mom is setting up an appt for her with a psych.   History of Present Illness: Pt here c/o con't panic attacks ---She increased the lexapro to 20 mg and xanax to 0.5mg   with no relief.  Pt made appointment with her Psych but she is not sure if she is a MD/ DO  or PhD.    Allergies (verified): No Known Drug Allergies  Past History:  Past Medical History: Last updated: 05/28/2008 Gastric Reflux anxiety basedow's disease pyelonephritis, nos Nephrolithiasis, hx of  Past Surgical History: Last updated: 07/06/2007 Tonsillectomy Ear tubes irrigation to ears  Family History: Last updated: 12/01/2006 Family History Diabetes 1st degree relative Family History Hypertension Family History Lung cancer Family History of Cardiovascular disorder  Family History: Reviewed history from 12/01/2006 and no changes required. Family History Diabetes 1st degree relative Family History Hypertension Family History Lung cancer Family History of Cardiovascular disorder  Review of Systems      See HPI  Physical Exam  General:  Well-developed,well-nourished,in no acute distress; alert,appropriate and cooperative throughout examination Psych:  Oriented X3, normally interactive, and moderately anxious.     Impression & Recommendations:  Problem # 1:  ANXIETY STATE, UNSPECIFIED (ICD-300.00) Names and numbers of psych given to pt--- Her updated medication list for this problem includes:    Lexapro 20 Mg Tabs (Escitalopram oxalate) .Marland Kitchen... 1 by mouth  once daily    Alprazolam 0.25 Mg Tabs (Alprazolam) .Marland Kitchen... 1-2  by mouth three times a day as needed    Klonopin 0.5 Mg Tabs (Clonazepam) .Marland Kitchen... 1 by mouth two times a day  Discussed medication use and relaxation techniques.   Complete Medication List: 1)  Lexapro 20 Mg Tabs (Escitalopram oxalate) .Marland Kitchen.. 1 by mouth once daily 2)  Alprazolam 0.25 Mg Tabs (Alprazolam) .Marland Kitchen.. 1-2  by mouth three times a day as needed 3)  Klonopin 0.5 Mg Tabs (Clonazepam) .Marland Kitchen.. 1 by mouth two times a day 4)  Mobic 15 Mg Tabs (Meloxicam) .... 1/2 - 1 by mouth once daily as needed arthritis  Patient Instructions: 1)  RTO 1 month--unless she is able to be seen by psych  Prescriptions: MOBIC 15 MG TABS (MELOXICAM) 1/2 - 1 by mouth once daily as needed arthritis  #30 x 5   Entered and Authorized by:   Loreen Freud DO   Signed by:   Loreen Freud DO on 10/02/2009   Method used:   [facility] to        [facility] [phone]* (retail)       [address]       [place], [place]  [address]       Ph: [phone]       Fax: [phone]   RxID:   [id number] ALPRAZOLAM 0.25 MG TABS (ALPRAZOLAM) 1-2  by mouth three times a day as needed  #30 x 0   Entered and Authorized by:   Myrene Buddy  Lowne DO   Signed by:   Loreen Freud DO on 10/02/2009   Method used:   Print then Give to Patient   RxID:   [id number] KLONOPIN 0.5 MG TABS (CLONAZEPAM) 1 by mouth two times a day  #60 x 0   Entered and Authorized by:   Loreen Freud DO   Signed by:   Loreen Freud DO on 10/02/2009   Method used:   Print then Give to Patient   RxID:   [id number]

## 2010-07-14 NOTE — Assessment & Plan Note (Signed)
Summary: ANXIETY,PANIC ATTACKS/RH.....   Vital Signs:  Patient profile:   25 year old female Height:      66 inches Weight:      127 pounds BMI:     20.57 O2 Sat:      98 % on Room air Pulse rate:   82 / minute Pulse rhythm:   regular BP sitting:   122 / 80  (left arm) Cuff size:   regular  Vitals Entered By: Army Fossa CMA (September 23, 2009 1:20 PM)  O2 Flow:  Room air CC: panic attacks   History of Present Illness: Pt here c/o panic attacks for the past few months.  Pt states she gets them about 2x a week.   Pt states she feels chest pain --no radiation and she gets SOB--- episodes last  anywhere from several min- 1 hour.   Pt denies any increased stress.  --- but upon further questioning she admits to grandmother being dx with stage 3 lung Cancer.     Current Medications (verified): 1)  None  Allergies (verified): No Known Drug Allergies  Past History:  Past medical, surgical, family and social histories (including risk factors) reviewed for relevance to current acute and chronic problems.  Past Medical History: Reviewed history from 05/28/2008 and no changes required. Gastric Reflux anxiety basedow's disease pyelonephritis, nos Nephrolithiasis, hx of  Past Surgical History: Reviewed history from 07/06/2007 and no changes required. Tonsillectomy Ear tubes irrigation to ears  Family History: Reviewed history from 12/01/2006 and no changes required. Family History Diabetes 1st degree relative Family History Hypertension Family History Lung cancer Family History of Cardiovascular disorder  Social History: Reviewed history and no changes required.  Review of Systems      See HPI  Physical Exam  General:  Well-developed,well-nourished,in no acute distress; alert,appropriate and cooperative throughout examination Lungs:  Normal respiratory effort, chest expands symmetrically. Lungs are clear to auscultation, no crackles or wheezes. Heart:  normal rate  and no murmur.   Msk:  nodule R foot lat--NT  Neurologic:  alert & oriented X3.   Skin:  Intact without suspicious lesions or rashes Psych:  Oriented X3, normally interactive, and slightly anxious.     Impression & Recommendations:  Problem # 1:  ANXIETY STATE, UNSPECIFIED (ICD-300.00)  Her updated medication list for this problem includes:    Lexapro 10 Mg Tabs (Escitalopram oxalate) .Marland Kitchen... 1 by mouth once daily    Alprazolam 0.25 Mg Tabs (Alprazolam) .Marland Kitchen... 1 by mouth three times a day as needed  Discussed medication use and relaxation techniques.   Complete Medication List: 1)  Lexapro 10 Mg Tabs (Escitalopram oxalate) .Marland Kitchen.. 1 by mouth once daily 2)  Alprazolam 0.25 Mg Tabs (Alprazolam) .Marland Kitchen.. 1 by mouth three times a day as needed  Patient Instructions: 1)  rto 4-6 weeks or sooner as needed  Prescriptions: ALPRAZOLAM 0.25 MG TABS (ALPRAZOLAM) 1 by mouth three times a day as needed  #30 x 0   Entered and Authorized by:   Loreen Freud DO   Signed by:   Loreen Freud DO on 09/23/2009   Method used:   Print then Give to Patient   RxID:   [id number] LEXAPRO 10 MG TABS (ESCITALOPRAM OXALATE) 1 by mouth once daily  #30 x 2   Entered and Authorized by:   Loreen Freud DO   Signed by:   Loreen Freud DO on 09/23/2009   Method used:   Print then Give to Patient   RxID:   [id number]  EKG  Procedure date:  09/23/2009  Findings:      Normal sinus rhythm with rate of:  73

## 2010-07-14 NOTE — Assessment & Plan Note (Signed)
Summary: PANIC ATTACKS,TEARFUL/CDJ   Vital Signs:  Patient profile:   25 year old female Weight:      120 pounds Pulse rate:   94 / minute Pulse rhythm:   regular BP sitting:   126 / 80  (left arm) Cuff size:   regular  Vitals Entered By: Army Fossa CMA (November 28, 2009 9:59 AM) CC: Pt here for panic attacks, states they are getting worse. She has not called any psych's states she does not have time.   History of Present Illness: Pt is having more panic attacks and mood swings.  Pt has not made appointment with psych yet ---she said she lost the numbers again.  Pt is not suicidal.  Current Medications (verified): 1)  Lexapro 20 Mg Tabs (Escitalopram Oxalate) .Marland Kitchen.. 1 By Mouth Once Daily 2)  Alprazolam 0.25 Mg Tabs (Alprazolam) .Marland Kitchen.. 1-2  By Mouth Three Times A Day As Needed 3)  Klonopin 1 Mg Tabs (Clonazepam) .Marland Kitchen.. 1 By Mouth Three Times A Day 4)  Abilify 5 Mg Tabs (Aripiprazole) .Marland Kitchen.. 1 By Mouth Once Daily  Allergies (verified): No Known Drug Allergies  Past History:  Past medical, surgical, family and social histories (including risk factors) reviewed for relevance to current acute and chronic problems.  Past Medical History: Reviewed history from 05/28/2008 and no changes required. Gastric Reflux anxiety basedow's disease pyelonephritis, nos Nephrolithiasis, hx of  Past Surgical History: Reviewed history from 07/06/2007 and no changes required. Tonsillectomy Ear tubes irrigation to ears  Family History: Reviewed history from 12/01/2006 and no changes required. Family History Diabetes 1st degree relative Family History Hypertension Family History Lung cancer Family History of Cardiovascular disorder  Social History: Reviewed history and no changes required.  Review of Systems      See HPI  Physical Exam  General:  Well-developed,well-nourished,in no acute distress; alert,appropriate and cooperative throughout examination Psych:  Oriented X3, poor eye  contact, severely anxious, and easily distracted.     Impression & Recommendations:  Problem # 1:  ANXIETY STATE, UNSPECIFIED (ICD-300.00)  Her updated medication list for this problem includes:    Lexapro 20 Mg Tabs (Escitalopram oxalate) .Marland Kitchen... 1 by mouth once daily    Alprazolam 0.25 Mg Tabs (Alprazolam) .Marland Kitchen... 1-2  by mouth three times a day as needed    Klonopin 1 Mg Tabs (Clonazepam) .Marland Kitchen... 1 by mouth three times a day  Problem # 2:  ARTHRITIS, BACK (ICD-721.90) ? tx resistant depression vs bipolar abilify 2 mg for 1 week then 5 mg once daily   Complete Medication List: 1)  Lexapro 20 Mg Tabs (Escitalopram oxalate) .Marland Kitchen.. 1 by mouth once daily 2)  Alprazolam 0.25 Mg Tabs (Alprazolam) .Marland Kitchen.. 1-2  by mouth three times a day as needed 3)  Klonopin 1 Mg Tabs (Clonazepam) .Marland Kitchen.. 1 by mouth three times a day 4)  Abilify 5 Mg Tabs (Aripiprazole) .Marland Kitchen.. 1 by mouth once daily Prescriptions: LEXAPRO 20 MG TABS (ESCITALOPRAM OXALATE) 1 by mouth once daily  #30 x 2   Entered and Authorized by:   Loreen Freud DO   Signed by:   Loreen Freud DO on 11/28/2009   Method used:   Electronically to        [facility] [phone][facility] (retail)       [address]       [place], [place]  [address]       Ph: [phone]       Fax: [phone]  RxID:   [id number] ABILIFY 5 MG TABS (ARIPIPRAZOLE) 1 by mouth once daily  #30 x 2   Entered and Authorized by:   Loreen Freud DO   Signed by:   Loreen Freud DO on 11/28/2009   Method used:   Print then Give to Patient   RxID:   [id number] KLONOPIN 1 MG TABS (CLONAZEPAM) 1 by mouth three times a day  #90 x 0   Entered and Authorized by:   Loreen Freud DO   Signed by:   Loreen Freud DO on 11/28/2009   Method used:   Print then Give to Patient   RxID:   [id number]

## 2010-07-14 NOTE — Progress Notes (Signed)
Summary: --rt foot--still in lot of pain  Phone Note Call from Patient Call back at Home Phone [phone]   Caller: Patient Summary of Call: patient saw Dr Laury Axon for foot pain 04/07/2010---says right foot is really throbbing, still in a lot of pain, pain goes up leg--what is next step??  please call her at [phone] Initial call taken by: Jerolyn Shin,  April 30, 2010 9:59 AM  Follow-up for Phone Call        left message to call office. Per x-ray on 04-07-10 we will refer to ortho if no better............Marland KitchenFelecia Deloach CMA  April 30, 2010 11:03 AM   pt aware referral put in but requesting to be seen today. Pt has appt schedule for today............Marland KitchenFelecia Deloach CMA  April 30, 2010 11:17 AM

## 2010-07-14 NOTE — Assessment & Plan Note (Signed)
Summary: anxiety is worse/ok per nikki/kn   Vital Signs:  Patient profile:   25 year old female Weight:      128 pounds Temp:     98.3 degrees F oral Pulse rate:   80 / minute Pulse rhythm:   regular BP sitting:   108 / 64  (right arm)  Vitals Entered By: Almeta Monas CMA Duncan Dull) (February 10, 2010 12:46 PM) CC: ANXIETY WORST AND CHEST PAIN, ALSO JAMMED HER RIGHT HAND IN THE DOOR   CC:  ANXIETY WORST AND CHEST PAIN and ALSO JAMMED HER RIGHT HAND IN THE DOOR.  History of Present Illness: Pt here c/o increasing anxiety.  She had to reschedule psych appointment because work would not let her leave.   Pt also slammed R hand in car door and has a lot of pain in knuckles and numbness in hand, fingers and wrist.  Current Medications (verified): 1)  Lexapro 20 Mg Tabs (Escitalopram Oxalate) .Marland Kitchen.. 1 By Mouth Once Daily 2)  Klonopin 1 Mg Tabs (Clonazepam) .Marland Kitchen.. 1 By Mouth Three Times A Day 3)  Abilify 10 Mg Tabs (Aripiprazole) .Marland Kitchen.. 1 By Mouth Once Daily  Allergies (verified): No Known Drug Allergies  Past History:  Past medical, surgical, family and social histories (including risk factors) reviewed for relevance to current acute and chronic problems.  Past Medical History: Reviewed history from 05/28/2008 and no changes required. Gastric Reflux anxiety basedow's disease pyelonephritis, nos Nephrolithiasis, hx of  Past Surgical History: Reviewed history from 07/06/2007 and no changes required. Tonsillectomy Ear tubes irrigation to ears  Family History: Reviewed history from 12/01/2006 and no changes required. Family History Diabetes 1st degree relative Family History Hypertension Family History Lung cancer Family History of Cardiovascular disorder  Social History: Reviewed history from 12/04/2009 and no changes required. works as a Hospital doctor for [employer]  Review of Systems      See HPI  Physical Exam  General:  Well-developed,well-nourished,in no acute distress;  alert,appropriate and cooperative throughout examination Msk:  + swelling R 2,3 4 pip joints  tenderness with palpation --esp 3rd finger Psych:  Oriented X3, normally interactive, and moderately anxious.     Impression & Recommendations:  Problem # 1:  CRUSHING INJURY OF HAND (ICD-927.20)  Orders: T-Hand Right 3 views (73130TC) Ace Wraps 3-5 in/yard  (E4540)  Problem # 2:  ANXIETY STATE, UNSPECIFIED (ICD-300.00)  Her updated medication list for this problem includes:    Lexapro 20 Mg Tabs (Escitalopram oxalate) .Marland Kitchen... 1 1/2 tabs by mouth once daily    Klonopin 1 Mg Tabs (Clonazepam) .Marland Kitchen... 1 by mouth three times a day  Discussed medication use and relaxation techniques.   Complete Medication List: 1)  Lexapro 20 Mg Tabs (Escitalopram oxalate) .Marland Kitchen.. 1 1/2 tabs by mouth once daily 2)  Klonopin 1 Mg Tabs (Clonazepam) .Marland Kitchen.. 1 by mouth three times a day 3)  Abilify 10 Mg Tabs (Aripiprazole) .Marland Kitchen.. 1 by mouth once daily  Patient Instructions: 1)  increase lexapro to 20 mg  1 1/2 tab by mouth once daily  2)  keep appointment with psych Prescriptions: KLONOPIN 1 MG TABS (CLONAZEPAM) 1 by mouth three times a day  #90 x 0   Entered and Authorized by:   Loreen Freud DO   Signed by:   Loreen Freud DO on 02/10/2010   Method used:   Print then Give to Patient   RxID:   [id number] LEXAPRO 20 MG TABS (ESCITALOPRAM OXALATE) 1 1/2 tabs by mouth once daily  #45  x 2   Entered and Authorized by:   Loreen Freud DO   Signed by:   Loreen Freud DO on 02/10/2010   Method used:   [facility] to        [facility] [phone]* (retail)       [address]       [place], [place]  [address]       Ph: [phone]       Fax: [phone]   RxID:   [id number]

## 2010-07-14 NOTE — Progress Notes (Signed)
Summary: PRIOR AUTH approved lexapro [employer]  Phone Note Refill Request Call back at [phone] Message from:  Pharmacy on February 10, 2010 4:04 PM  Refills Requested: Medication #1:  LEXAPRO 20 MG TABS 1 1/2 tabs by mouth once daily   Dosage confirmed as above?Dosage Confirmed   Supply Requested: 1 month PRIOR AUTH : [phone]  Next Appointment Scheduled: NONE Initial call taken by: Lavell Islam,  February 10, 2010 4:05 PM Caller: Patient  Follow-up for Phone Call        awaiting fax case# [id number]................Marland KitchenFelecia Deloach CMA  February 11, 2010 8:27 AM  prior auth faxed back awaiting response.........Marland KitchenFelecia Deloach CMA  February 11, 2010 9:39 AM   Prior auth approved 01-20-10 until 02-11-11, pharmacy faxed, approval letter scan to chart................Marland KitchenFelecia Deloach CMA  February 12, 2010 11:07 AM

## 2010-07-14 NOTE — Progress Notes (Signed)
Summary: med not helping  Phone Note Call from Patient Call back at Home Phone [phone]   Caller: Patient Summary of Call: pt left VM that she would like for nurse to give her a call back. no further detail left. left message to call office..........Marland KitchenFelecia Deloach CMA  September 29, 2009 12:29 PM   Follow-up for Phone Call        pt still c/o pain attack even with med. Pt states that she has taken ALPRAZOLAM 0.25 MG 1 by mouth three times a day and it is still not helping. pt states that she started off with half tab then increase to 1 tab when it did not work. pls advise...................Marland KitchenFelecia Deloach CMA  September 29, 2009 3:45 PM   Additional Follow-up for Phone Call Additional follow up Details #1::        Increase Lexapro to 20 mg and can take 2 xanax at a time Additional Follow-up by: Loreen Freud DO,  September 29, 2009 4:17 PM    Additional Follow-up for Phone Call Additional follow up Details #2::    pt aware .................Marland KitchenFelecia Deloach CMA  September 29, 2009 4:31 PM   New/Updated Medications: LEXAPRO 20 MG TABS (ESCITALOPRAM OXALATE) 1 by mouth once daily ALPRAZOLAM 0.25 MG TABS (ALPRAZOLAM) 1-2  by mouth three times a day as needed

## 2010-07-14 NOTE — Progress Notes (Signed)
Summary:  pain left side, go to ED  Phone Note Call from Patient Call back at [phone]   Caller: Patient Summary of Call: pt c/o nausea and vomiting, pain and tenderness on left side, burning, frequency, and urgency with urination. pt also c/o a constant fullness but only able to expel small amounts. Pt denies any diarrhea, swelling or redness on side.Advise pt to ED, pt ok.....................Marland KitchenFelecia Deloach CMA  June 17, 2009 12:13 PM   Follow-up for Phone Call        please call later to see how she is.   Follow-up by: Loreen Freud DO,  June 17, 2009 1:05 PM  Additional Follow-up for Phone Call Additional follow up Details #1::        pt states that she is still in waiting room waiting to be seen. pt states that she is now unable to keep anything down....................Marland KitchenFelecia Deloach CMA  June 17, 2009 3:38 PM  left message to call  office...........Marland KitchenFelecia Deloach CMA  June 19, 2009 8:42 AM     Additional Follow-up for Phone Call Additional follow up Details #2::    check tomorrow to see if she was admitted.   Follow-up by: Loreen Freud DO,  June 17, 2009 4:22 PM  Additional Follow-up for Phone Call Additional follow up Details #3:: Details for Additional Follow-up Action Taken: left message to call  office............Marland KitchenFelecia Deloach CMA  June 18, 2009 8:32 AM  left message to call  office...................Marland KitchenFelecia Deloach CMA  June 18, 2009 5:01 PM   EChart notes printed and given to Dr.Lowne./[facility]  June 24, 2009 11:40 AM

## 2010-07-17 NOTE — Medication Information (Signed)
Summary: Prior Authorization & Approval for Lexapro/[employer]  Prior Authorization & Approval for Lexapro/[employer]   Imported By: Lanelle Bal 02/24/2010 10:06:21  _____________________________________________________________________  External Attachment:    Type:   Image     Comment:   External Document

## 2010-08-24 LAB — BASIC METABOLIC PANEL
BUN: 6 mg/dL (ref 6–23)
Calcium: 9.2 mg/dL (ref 8.4–10.5)
GFR calc non Af Amer: >60 mL/min (ref >60)
Potassium: 3.8 mEq/L (ref 3.5–5.1)
Sodium: 138 mEq/L (ref 135–145)

## 2010-08-24 LAB — DIFFERENTIAL
Basophils Absolute: 0 10*3/uL (ref 0.0–0.1)
Basophils Relative: 1 % (ref 0–1)
Lymphocytes Relative: 33 % (ref 12–46)
Monocytes Absolute: 0.6 10*3/uL (ref 0.1–1.0)
Neutro Abs: 3.8 10*3/uL (ref 1.7–7.7)
Neutrophils Relative %: 50 % (ref 43–77)

## 2010-08-24 LAB — WET PREP, GENITAL: Trich, Wet Prep: NONE SEEN

## 2010-08-24 LAB — URINALYSIS, ROUTINE W REFLEX MICROSCOPIC
Bilirubin Urine: NEGATIVE
Glucose, UA: NEGATIVE mg/dL
Ketones, ur: NEGATIVE mg/dL
Protein, ur: NEGATIVE mg/dL

## 2010-08-24 LAB — CBC
HCT: 39.4 % (ref 36.0–46.0)
MCHC: 33.5 g/dL (ref 30.0–36.0)
RDW: 13.6 % (ref 11.5–15.5)
WBC: 7.6 10*3/uL (ref 4.0–10.5)

## 2010-08-24 LAB — GC/CHLAMYDIA PROBE AMP, GENITAL: Chlamydia, DNA Probe: NEGATIVE

## 2010-08-25 ENCOUNTER — Ambulatory Visit
Admission: RE | Admit: 2010-08-25 | Discharge: 2010-08-25 | Disposition: A | Discharge status: Home / Self Care | Payer: Commercial Managed Care - PPO | Source: Ambulatory Visit | Attending: Urology | Admitting: Urology

## 2010-08-25 ENCOUNTER — Other Ambulatory Visit: Payer: Self-pay | Admitting: Urology

## 2010-08-25 DIAGNOSIS — N83201 Unspecified ovarian cyst, right side: Secondary | ICD-10-CM

## 2010-08-30 LAB — WET PREP, GENITAL
Trich, Wet Prep: NONE SEEN
WBC, Wet Prep HPF POC: NONE SEEN

## 2010-08-30 LAB — COMPREHENSIVE METABOLIC PANEL
ALT: 12 U/L (ref 0–35)
Alkaline Phosphatase: 53 U/L (ref 39–117)
BUN: 5 mg/dL — ABNORMAL LOW (ref 6–23)
CO2: 28 mEq/L (ref 19–32)
Chloride: 105 mEq/L (ref 96–112)
Glucose, Bld: 85 mg/dL (ref 70–99)
Potassium: 3.5 mEq/L (ref 3.5–5.1)
Sodium: 139 mEq/L (ref 135–145)
Total Bilirubin: 0.6 mg/dL (ref 0.3–1.2)
Total Protein: 6.9 g/dL (ref 6.0–8.3)

## 2010-08-30 LAB — URINALYSIS, ROUTINE W REFLEX MICROSCOPIC
Bilirubin Urine: NEGATIVE
Hgb urine dipstick: NEGATIVE
Nitrite: NEGATIVE
Protein, ur: NEGATIVE mg/dL
Specific Gravity, Urine: 1.01 (ref 1.005–1.030)
Urobilinogen, UA: 0.2 mg/dL (ref 0.0–1.0)

## 2010-08-30 LAB — DIFFERENTIAL
Basophils Absolute: 0 10*3/uL (ref 0.0–0.1)
Basophils Relative: 0 % (ref 0–1)
Eosinophils Absolute: 0.4 10*3/uL (ref 0.0–0.7)
Monocytes Relative: 6 % (ref 3–12)
Neutro Abs: 4.9 10*3/uL (ref 1.7–7.7)
Neutrophils Relative %: 57 % (ref 43–77)

## 2010-08-30 LAB — PREGNANCY, URINE: Preg Test, Ur: NEGATIVE

## 2010-08-30 LAB — URINE CULTURE

## 2010-08-30 LAB — CBC
HCT: 40.6 % (ref 36.0–46.0)
Hemoglobin: 13.1 g/dL (ref 12.0–15.0)
RBC: 4.65 MIL/uL (ref 3.87–5.11)
WBC: 8.5 10*3/uL (ref 4.0–10.5)

## 2010-09-06 LAB — URINE CULTURE: Colony Count: 90000

## 2010-09-06 LAB — POCT URINALYSIS DIP (DEVICE)
Bilirubin Urine: NEGATIVE
Ketones, ur: NEGATIVE mg/dL
Protein, ur: NEGATIVE mg/dL
pH: 5.5 (ref 5.0–8.0)

## 2010-09-16 ENCOUNTER — Emergency Department (HOSPITAL_COMMUNITY)
Admission: EM | Admit: 2010-09-16 | Discharge: 2010-09-16 | Disposition: A | Discharge status: Home / Self Care | Payer: 59 | Attending: Emergency Medicine | Admitting: Emergency Medicine

## 2010-09-16 ENCOUNTER — Emergency Department (HOSPITAL_COMMUNITY): Payer: 59

## 2010-09-16 ENCOUNTER — Emergency Department (HOSPITAL_COMMUNITY)
Admission: EM | Admit: 2010-09-16 | Discharge: 2010-09-17 | Disposition: A | Discharge status: Home / Self Care | Payer: 59 | Attending: Emergency Medicine | Admitting: Emergency Medicine

## 2010-09-16 DIAGNOSIS — S20229A Contusion of unspecified back wall of thorax, initial encounter: Secondary | ICD-10-CM | POA: Insufficient documentation

## 2010-09-16 DIAGNOSIS — S90129A Contusion of unspecified lesser toe(s) without damage to nail, initial encounter: Secondary | ICD-10-CM | POA: Insufficient documentation

## 2010-09-16 DIAGNOSIS — R110 Nausea: Secondary | ICD-10-CM | POA: Insufficient documentation

## 2010-09-16 DIAGNOSIS — S1093XA Contusion of unspecified part of neck, initial encounter: Secondary | ICD-10-CM | POA: Insufficient documentation

## 2010-09-16 DIAGNOSIS — M25569 Pain in unspecified knee: Secondary | ICD-10-CM | POA: Insufficient documentation

## 2010-09-16 DIAGNOSIS — M545 Low back pain, unspecified: Secondary | ICD-10-CM | POA: Insufficient documentation

## 2010-09-16 DIAGNOSIS — M546 Pain in thoracic spine: Secondary | ICD-10-CM | POA: Insufficient documentation

## 2010-09-16 DIAGNOSIS — Y9269 Other specified industrial and construction area as the place of occurrence of the external cause: Secondary | ICD-10-CM | POA: Insufficient documentation

## 2010-09-16 DIAGNOSIS — S0003XA Contusion of scalp, initial encounter: Secondary | ICD-10-CM | POA: Insufficient documentation

## 2010-09-16 DIAGNOSIS — S8000XA Contusion of unspecified knee, initial encounter: Secondary | ICD-10-CM | POA: Insufficient documentation

## 2010-09-16 DIAGNOSIS — W208XXA Other cause of strike by thrown, projected or falling object, initial encounter: Secondary | ICD-10-CM | POA: Insufficient documentation

## 2010-09-16 DIAGNOSIS — R51 Headache: Secondary | ICD-10-CM | POA: Insufficient documentation

## 2010-09-16 DIAGNOSIS — W108XXA Fall (on) (from) other stairs and steps, initial encounter: Secondary | ICD-10-CM | POA: Insufficient documentation

## 2010-09-17 ENCOUNTER — Emergency Department (HOSPITAL_COMMUNITY): Payer: 59

## 2010-09-17 ENCOUNTER — Emergency Department (HOSPITAL_COMMUNITY)
Admission: EM | Admit: 2010-09-17 | Discharge: 2010-09-17 | Disposition: A | Discharge status: Home / Self Care | Payer: 59 | Attending: Emergency Medicine | Admitting: Emergency Medicine

## 2010-09-17 DIAGNOSIS — M129 Arthropathy, unspecified: Secondary | ICD-10-CM | POA: Insufficient documentation

## 2010-09-17 DIAGNOSIS — R42 Dizziness and giddiness: Secondary | ICD-10-CM | POA: Insufficient documentation

## 2010-09-17 DIAGNOSIS — S0003XA Contusion of scalp, initial encounter: Secondary | ICD-10-CM | POA: Insufficient documentation

## 2010-09-17 DIAGNOSIS — S40029A Contusion of unspecified upper arm, initial encounter: Secondary | ICD-10-CM | POA: Insufficient documentation

## 2010-09-17 DIAGNOSIS — Y929 Unspecified place or not applicable: Secondary | ICD-10-CM | POA: Insufficient documentation

## 2010-09-17 DIAGNOSIS — R5383 Other fatigue: Secondary | ICD-10-CM | POA: Insufficient documentation

## 2010-09-17 DIAGNOSIS — F0781 Postconcussional syndrome: Secondary | ICD-10-CM | POA: Insufficient documentation

## 2010-09-17 DIAGNOSIS — S0510XA Contusion of eyeball and orbital tissues, unspecified eye, initial encounter: Secondary | ICD-10-CM | POA: Insufficient documentation

## 2010-09-17 DIAGNOSIS — S8010XA Contusion of unspecified lower leg, initial encounter: Secondary | ICD-10-CM | POA: Insufficient documentation

## 2010-09-17 DIAGNOSIS — R5381 Other malaise: Secondary | ICD-10-CM | POA: Insufficient documentation

## 2010-09-17 DIAGNOSIS — R51 Headache: Secondary | ICD-10-CM | POA: Insufficient documentation

## 2010-09-17 DIAGNOSIS — Z79899 Other long term (current) drug therapy: Secondary | ICD-10-CM | POA: Insufficient documentation

## 2010-09-17 DIAGNOSIS — W010XXA Fall on same level from slipping, tripping and stumbling without subsequent striking against object, initial encounter: Secondary | ICD-10-CM | POA: Insufficient documentation

## 2010-09-17 LAB — BASIC METABOLIC PANEL
BUN: 5 mg/dL — ABNORMAL LOW (ref 6–23)
Calcium: 9 mg/dL (ref 8.4–10.5)
Creatinine, Ser: 0.89 mg/dL (ref 0.4–1.2)
GFR calc non Af Amer: >60 mL/min (ref >60)
Glucose, Bld: 88 mg/dL (ref 70–99)
Potassium: 3.6 mEq/L (ref 3.5–5.1)

## 2010-09-17 LAB — ETHANOL: Alcohol, Ethyl (B): <5 mg/dL (ref 0–10)

## 2010-09-17 LAB — RAPID URINE DRUG SCREEN, HOSP PERFORMED: Tetrahydrocannabinol: POSITIVE — AB

## 2010-09-17 LAB — CBC
MCH: 28.4 pg (ref 26.0–34.0)
MCHC: 32.3 g/dL (ref 30.0–36.0)
MCV: 88 fL (ref 78.0–100.0)
Platelets: 203 10*3/uL (ref 150–400)
RBC: 4.82 MIL/uL (ref 3.87–5.11)
RDW: 13 % (ref 11.5–15.5)

## 2010-09-17 LAB — DIFFERENTIAL
Basophils Relative: 1 % (ref 0–1)
Eosinophils Absolute: 0.6 10*3/uL (ref 0.0–0.7)
Eosinophils Relative: 5 % (ref 0–5)
Monocytes Relative: 10 % (ref 3–12)
Neutrophils Relative %: 59 % (ref 43–77)

## 2010-09-17 LAB — POCT PREGNANCY, URINE: Preg Test, Ur: NEGATIVE

## 2010-09-20 LAB — CBC
HCT: 39.3 % (ref 36.0–46.0)
Hemoglobin: 12.6 g/dL (ref 12.0–15.0)
MCHC: 32 g/dL (ref 30.0–36.0)
Platelets: 142 10*3/uL — ABNORMAL LOW (ref 150–400)
RDW: 13.9 % (ref 11.5–15.5)

## 2010-09-20 LAB — URINE MICROSCOPIC-ADD ON

## 2010-09-20 LAB — BASIC METABOLIC PANEL
BUN: 4 mg/dL — ABNORMAL LOW (ref 6–23)
CO2: 28 mEq/L (ref 19–32)
Calcium: 9.1 mg/dL (ref 8.4–10.5)
GFR calc non Af Amer: >60 mL/min (ref >60)
Glucose, Bld: 95 mg/dL (ref 70–99)
Potassium: 3.9 mEq/L (ref 3.5–5.1)
Sodium: 137 mEq/L (ref 135–145)

## 2010-09-20 LAB — URINALYSIS, ROUTINE W REFLEX MICROSCOPIC
Glucose, UA: NEGATIVE mg/dL
Ketones, ur: 15 mg/dL — AB
Leukocytes, UA: NEGATIVE
Nitrite: NEGATIVE
Specific Gravity, Urine: 1.006 (ref 1.005–1.030)
pH: 7 (ref 5.0–8.0)

## 2010-09-20 LAB — DIFFERENTIAL
Basophils Absolute: 0 10*3/uL (ref 0.0–0.1)
Basophils Relative: 0 % (ref 0–1)
Eosinophils Relative: 0 % (ref 0–5)
Monocytes Absolute: 0.8 10*3/uL (ref 0.1–1.0)

## 2010-09-20 LAB — URINE CULTURE

## 2010-09-21 ENCOUNTER — Telehealth: Payer: Self-pay | Admitting: Family Medicine

## 2010-09-21 NOTE — Telephone Encounter (Signed)
Pt states that she fell from ladder and does not remember anything. Pt could not recall any of the treatment received at ED or give any details to the incident other then she fell off ladder and she hurts. Pt states that she is in so much pain. Pt advise if pain is that severe she needs to be seen in ED/UC. Pt  Ok will forward to physician for any further suggestions.

## 2010-09-21 NOTE — Telephone Encounter (Signed)
Er gave her pain meds---if she is in that much pain she probably needs to go to whatever ortho er recommended to her or go to er.  We can not order any pain meds without seeing her but all films in ER were negative.

## 2010-09-21 NOTE — Telephone Encounter (Signed)
Left message to call back     KP 

## 2010-09-21 NOTE — Telephone Encounter (Signed)
Patient says she fell 10 feet a few days ago, went to ER on Sat and Sun---stills feels nauseated and dizzy, still has a lot of pain--please call [phone]

## 2010-09-22 LAB — URINE CULTURE

## 2010-09-22 LAB — URINE MICROSCOPIC-ADD ON

## 2010-09-22 LAB — URINALYSIS, ROUTINE W REFLEX MICROSCOPIC
Glucose, UA: NEGATIVE mg/dL
Specific Gravity, Urine: 1.003 — ABNORMAL LOW (ref 1.005–1.030)
pH: 7.5 (ref 5.0–8.0)

## 2010-09-22 LAB — BASIC METABOLIC PANEL
BUN: 6 mg/dL (ref 6–23)
Calcium: 9.7 mg/dL (ref 8.4–10.5)
Chloride: 100 mEq/L (ref 96–112)
Creatinine, Ser: 0.8 mg/dL (ref 0.4–1.2)
GFR calc non Af Amer: >60 mL/min (ref >60)

## 2010-09-22 NOTE — Telephone Encounter (Signed)
Spoke with patient and she stated she would like to make an appt to F/U.     KP

## 2010-09-24 LAB — LIPASE, BLOOD: Lipase: 37 U/L (ref 11–59)

## 2010-09-24 LAB — CBC
MCV: 86 fL (ref 78.0–100.0)
RBC: 4.79 MIL/uL (ref 3.87–5.11)
WBC: 8.3 10*3/uL (ref 4.0–10.5)

## 2010-09-24 LAB — COMPREHENSIVE METABOLIC PANEL
ALT: 15 U/L (ref 0–35)
AST: 18 U/L (ref 0–37)
CO2: 31 mEq/L (ref 19–32)
Chloride: 104 mEq/L (ref 96–112)
GFR calc Af Amer: >60 mL/min (ref >60)
GFR calc non Af Amer: >60 mL/min (ref >60)
Sodium: 140 mEq/L (ref 135–145)
Total Bilirubin: 0.4 mg/dL (ref 0.3–1.2)

## 2010-09-24 LAB — URINALYSIS, ROUTINE W REFLEX MICROSCOPIC
Hgb urine dipstick: NEGATIVE
Nitrite: NEGATIVE
Specific Gravity, Urine: 1.013 (ref 1.005–1.030)
Urobilinogen, UA: 0.2 mg/dL (ref 0.0–1.0)
pH: 6.5 (ref 5.0–8.0)

## 2010-09-24 LAB — DIFFERENTIAL
Basophils Absolute: 0 10*3/uL (ref 0.0–0.1)
Basophils Relative: 1 % (ref 0–1)
Eosinophils Absolute: 0.3 10*3/uL (ref 0.0–0.7)
Eosinophils Relative: 4 % (ref 0–5)

## 2010-09-25 ENCOUNTER — Encounter: Payer: Self-pay | Admitting: Family Medicine

## 2010-09-28 ENCOUNTER — Encounter: Payer: Self-pay | Admitting: Family Medicine

## 2010-09-28 ENCOUNTER — Ambulatory Visit (INDEPENDENT_AMBULATORY_CARE_PROVIDER_SITE_OTHER): Payer: 59 | Admitting: Family Medicine

## 2010-09-28 DIAGNOSIS — S0990XA Unspecified injury of head, initial encounter: Secondary | ICD-10-CM

## 2010-09-28 LAB — COMPREHENSIVE METABOLIC PANEL
AST: 18 U/L (ref 0–37)
Albumin: 4.6 g/dL (ref 3.5–5.2)
BUN: 4 mg/dL — ABNORMAL LOW (ref 6–23)
CO2: 24 mEq/L (ref 19–32)
Calcium: 8.9 mg/dL (ref 8.4–10.5)
Calcium: 9.8 mg/dL (ref 8.4–10.5)
Chloride: 99 mEq/L (ref 96–112)
Creatinine, Ser: 0.68 mg/dL (ref 0.4–1.2)
GFR calc Af Amer: >60 mL/min (ref >60)
GFR calc non Af Amer: >60 mL/min (ref >60)
Glucose, Bld: 82 mg/dL (ref 70–99)
Total Protein: 6.6 g/dL (ref 6.0–8.3)
Total Protein: 7.2 g/dL (ref 6.0–8.3)

## 2010-09-28 LAB — CBC
HCT: 36.7 % (ref 36.0–46.0)
Hemoglobin: 11.9 g/dL — ABNORMAL LOW (ref 12.0–15.0)
MCHC: 32.3 g/dL (ref 30.0–36.0)
MCHC: 31.6 g/dL (ref 30.0–36.0)
MCV: 84.6 fL (ref 78.0–100.0)
MCV: 84.7 fL (ref 78.0–100.0)
Platelets: 151 10*3/uL (ref 150–400)
RBC: 4.34 MIL/uL (ref 3.87–5.11)
RDW: 13.1 % (ref 11.5–15.5)
RDW: 13.6 % (ref 11.5–15.5)
WBC: 9 10*3/uL (ref 4.0–10.5)

## 2010-09-28 LAB — DIFFERENTIAL
Basophils Absolute: 0.1 10*3/uL (ref 0.0–0.1)
Eosinophils Relative: 2 % (ref 0–5)
Eosinophils Relative: 6 % — ABNORMAL HIGH (ref 0–5)
Lymphocytes Relative: 31 % (ref 12–46)
Lymphocytes Relative: 44 % (ref 12–46)
Lymphs Abs: 4 10*3/uL (ref 0.7–4.0)
Monocytes Absolute: 0.5 10*3/uL (ref 0.1–1.0)
Monocytes Relative: 6 % (ref 3–12)
Neutro Abs: 6.3 10*3/uL (ref 1.7–7.7)

## 2010-09-28 LAB — URINALYSIS, ROUTINE W REFLEX MICROSCOPIC
Hgb urine dipstick: NEGATIVE
Hgb urine dipstick: NEGATIVE
Protein, ur: NEGATIVE mg/dL
Specific Gravity, Urine: 1.02 (ref 1.005–1.030)
Urobilinogen, UA: 0.2 mg/dL (ref 0.0–1.0)
pH: 7 (ref 5.0–8.0)

## 2010-09-28 LAB — LIPASE, BLOOD: Lipase: 21 U/L (ref 11–59)

## 2010-09-28 LAB — PREGNANCY, URINE: Preg Test, Ur: NEGATIVE

## 2010-09-28 NOTE — Progress Notes (Signed)
  Subjective:    Patient ID: [name], female    DOB: February 03, 1986, 25 y.o.   MRN: [id number]  HPI Pt here f/u 3 ER visits for fall / head injury.  Pt was on a roof with some friends and she snorted some substance she didn't know what it was and then got up got up a ladder and fell.  Pt states she had LOC for for few seconds but she can't remember what happened.  Her fiance called an ambulance and she went to [facility] ER.  The next day she went to [facility] ER ( though she denies that)  And 4/5 she went to [facility] ER because she was worried about the knot on her forehead.  She is still having L rib pain.  CT head done in ER --negative.     Review of Systems  All other systems reviewed and are negative.       Objective:   Physical Exam  Constitutional: She is oriented to person, place, and time. She appears well-developed and well-nourished.  HENT:  Head: Normocephalic.       + hematoma L side forehead Healing ecchymosis both eyes  Eyes: Conjunctivae and EOM are normal. Pupils are equal, round, and reactive to light.  Neck: Normal range of motion. Neck supple.  Cardiovascular: Normal rate and regular rhythm.   Pulmonary/Chest: Effort normal and breath sounds normal.  Abdominal: Soft. Bowel sounds are normal.  Musculoskeletal: Normal range of motion. She exhibits no edema and no tenderness.  Neurological: She is alert and oriented to person, place, and time. No cranial nerve deficit.  Psychiatric: She has a normal mood and affect.          Assessment & Plan:

## 2010-09-28 NOTE — Patient Instructions (Signed)
Head Injuries, Adult You have had a head injury which does not appear serious at this time. A concussion is a state of changed mental ability, usually from a blow to the head. You should take clear liquids for the rest of the day and then resume your regular diet.  After injuries such as yours, most problems occur within the first 24 hours.  THESE MINOR SYMPTOMS MAY BE SEEN AFTER DISCHARGE:  Memory difficulties  Dizziness   Headaches   Double vision  Hearing difficulties   Depression   Tiredness  Weakness   Difficulty with concentration   If you experience any of these problems, you should not be alarmed. A concussion requires a few days for recovery. Many patients with head injuries frequently experience such symptoms. Usually, these problems disappear without medical care. If symptoms last for more than one day, notify your caregiver. See your caregiver sooner if symptoms are becoming worse rather than better. HOME CARE INSTRUCTIONS  During the next 24 hours you must stay with someone who can watch you for the warning signs listed below.  Although it is unlikely that serious side effects will occur, you should be aware of signs and symptoms which may necessitate your return to this location. Side effects may occur up to 7 - 10 days following the injury. It is important for you to carefully monitor your condition and contact your caregiver or seek immediate medical attention if there is a change in your condition. 1. SEEK IMMEDIATE MEDICAL CARE IF:   There is confusion or drowsiness.   You can not awaken the injured person.   There is nausea (feeling sick to your stomach) or continued, forceful vomiting.   You notice dizziness or unsteadiness which is getting worse, or inability to walk.   You have convulsions or unconsciousness.   You experience severe, persistent headaches not relieved by over-the-counter or prescription medicines for pain. (Do not take aspirin as this impairs  clotting abilities). Take other pain medications only as directed.   You can not use arms or legs normally.   There is clear or bloody discharge from the nose or ears.  MAKE SURE YOU:   Understand these instructions.   Will watch your condition.   Will get help right away if you are not doing well or get worse.  Document Released: 05/31/2005 Document Re-Released: 05/19/2009 [facility] Patient Information 2011 [place], [place].

## 2010-09-28 NOTE — Assessment & Plan Note (Signed)
con't heat on hematoma Tylenol for pain rto prn  ER records---all 3 visits reviewed

## 2010-10-27 NOTE — Op Note (Signed)
NAMEJEZABELLA, [name]            ACCOUNT NO.:  [id number]   MEDICAL RECORD NO.:  [id number]          PATIENT TYPE:  AMB   LOCATION:  ENDO                         FACILITY[facility]  [facility]   PHYSICIAN:  Shirley Friar, MDDATE OF BIRTH:  09-Mar-1986   DATE OF PROCEDURE:  03/29/2008  DATE OF DISCHARGE:                               OPERATIVE REPORT   PROBLEM:  Persistent vomiting, abdominal pain and diarrhea.   PROCEDURE:  Colonoscopy.   MEDICATIONS:  Fentanyl 125 mcg IV, Versed 5.5 mg IV.   FINDINGS:  Rectal exam was normal.  A pediatric Pentax colonoscope was  inserted into a fair prepped colon and advanced to the cecum where  ileocecal valve and appendiceal orifice were identified.  The terminal  ileum was intubated and of a focal area of nodular mucosa in the distal  part of the terminal ileum.  Biopsies were taken of this area.  Biopsies  were also taken proximal to this area with normal appearing ileal  mucosa.  On careful withdrawal of the colonoscope no other mucosal  abnormalities were seen.  Retroflexion was unremarkable.   ASSESSMENT:  Focal area of nodularity in terminal ileum status post  biopsy.   PLAN:  1. Follow up on pathology.  2. Proceed with upper endoscopy.      Shirley Friar, MD  Electronically Signed     VCS/MEDQ  D:  03/29/2008  T:  03/29/2008  Job:  [id number]

## 2010-10-27 NOTE — Discharge Summary (Signed)
[name], [name]            ACCOUNT NO.:  [id number]   MEDICAL RECORD NO.:  [id number]          PATIENT TYPE:  INP   LOCATION:  1609                         FACILITY:  [facility]   PHYSICIAN:  Rosalyn Gess. Norins, MD  DATE OF BIRTH:  1986-01-06   DATE OF ADMISSION:  02/10/2007  DATE OF DISCHARGE:  02/12/2007                               DISCHARGE SUMMARY   ADMISSION DIAGNOSIS:  Refractory abdominal pain   DISCHARGE DIAGNOSES:  Refractory abdominal pain, with pain controlled.   CONSULTANTS:  None.   PROCEDURE:  CT of the abdomen and pelvis which were unremarkable except  for a 3.9 cm cystic lesion at the EG junction which is stable.   HISTORY OF PRESENT ILLNESS:  [name] is a 25 year old single white  female with history of back and flank pain since 2006 when she had an  episode of pyelonephritis.  She has intermittent episodes of pain that  comes in cycles.  She has had a full GYN, urology and gastroenterology  evaluation.  The patient had multiple imaging studies all negative.  At  admission she had pain which was uncontrollable.   HOSPITAL COURSE:  The patient was admitted to a regular bed.  She was  started on scheduled oxycodone 5 mg 1 or 2 tablets q.4.  On this regimen  she did well.  By her report in the left 12 hours, she has only had two  doses of oxycodone.  At this point with the patient's pain being  controlled, she is  taking a diet.  She is felt to be stable and ready  for discharge.   DISCHARGE EXAMINATION:  Temperature of 98.2, blood pressure 94/56, heart  rate 77, respirations were 18.  GENERAL APPEARANCE:  A pleasant young woman in no acute distress.  CARDIOVASCULAR EXAM revealed a regular rate and rhythm.  ABDOMEN: Positive bowel sounds were noted.  No guarding or rebound was  appreciated, no tenderness to deep palpation.   DISPOSITION:  The patient is discharged home.  She will continue with  oxycodone 5 mg every 6 hours as needed for pain, limited  prescription is  provided to carry her until she can see Dr. Laury Axon.  Dr. Laury Axon and the  patient will determine the pain management regimen as well as any need  for further diagnostic studies.   CONDITION AT TIME OF DISCHARGE:  Stable.      Rosalyn Gess Norins, MD  Electronically Signed     MEN/MEDQ  D:  02/12/2007  T:  02/12/2007  Job:  [id number]   cc:   Lelon Perla, DO  [address] [place], [place] [address]

## 2010-10-27 NOTE — Op Note (Signed)
NAMEARES, TEGTMEYER            ACCOUNT NO.:  [id number]   MEDICAL RECORD NO.:  [id number]          PATIENT TYPE:  AMB   LOCATION:  ENDO                         FACILITY:  [facility]   PHYSICIAN:  Shirley Friar, MDDATE OF BIRTH:  1986/05/03   DATE OF PROCEDURE:  DATE OF DISCHARGE:                               OPERATIVE REPORT   Upper endoscopy.   INDICATIONS:  Vomiting, abdominal pain, diarrhea.   MEDICINES:  Versed 1 mg IV, Cetacaine spray, additional medicine given  for preceding colonoscopy.   FINDINGS:  Endoscope was inserted into the oropharynx and esophagus was  intubated.  Upon insertion into the esophagus and passage to the distal  portion of the esophagus there was a area of fundic mucosa that was  prolapsing up into the distal esophagus.  The endoscope was advanced  down into the stomach and retroflexion was done which revealed a large  area of erythematous mucosa in the fundus consistent with irritation  from retching.  A small hiatal hernia was also noted.  Endoscope was  straightened and advanced down to the distal stomach which was normal in  appearance.  Endoscope was then advanced down to the duodenal bulb and  second portion of duodenum which was normal in appearance.  Biopsies  were taken of the second portion of the duodenum for histology.  Endoscope was withdrawn back into the stomach and retroflexion was again  done and this erythematous mucosa was again visualized.  A biopsy was  taken of this area for histology.  Endoscope was withdrawn to confirm  the above findings.   ASSESSMENT:  1. Moderate erythematous mucosa and fundus looked likely consistent      with gastritis (probably from excessive retching).  2. Small hiatal hernia.  3. Status post duodenal biopsies to check for celiac sprue.   PLAN:  1. Follow-up on path.  2. Continue antiemetics as needed.      Shirley Friar, MD  Electronically Signed     VCS/MEDQ  D:  03/29/2008  T:   03/29/2008  Job:  [id number]

## 2010-10-27 NOTE — H&P (Signed)
[name], [name]            ACCOUNT NO.:  [id number]   MEDICAL RECORD NO.:  [id number]          PATIENT TYPE:  INP   LOCATION:  1609                         FACILITY:  [facility]   PHYSICIAN:  Rosalyn Gess. Norins, MD  DATE OF BIRTH:  05-Apr-1986   DATE OF ADMISSION:  02/10/2007  DATE OF DISCHARGE:                              HISTORY & PHYSICAL   CHIEF COMPLAINT:  Flank pain.   HISTORY OF PRESENT ILLNESS:  [name] is a 25 year old single white  female who had a upper respiratory infection/pyelonephritis in 2006.  Since that time she has had intermittent episodes of severe flank pain  on the right. These episodes will come and be very painful for a week or  two and then dissipate and she will go four to six weeks without pain  and then she will have a recurrent bout of discomfort. The patient has  had extensive evaluation including GYN evaluation with Dr. Senaida Ores,  GU evaluation with Dr. __________ in [place], Dr. Vernie Ammons in  [place] and GI evaluation which is just getting underway. The patient  has also been followed by Dr. Laury Axon on a regular basis.   The patient has had multiple imaging studies including ultrasound of the  gallbladder in June of 08 which was negative, ultrasound of the pelvis  which was negative, transvaginal ultrasound that was negative, CT of the  abdomen and pelvis which was unremarkable except for a small cyst at the  GE junction. The patient in August had repeat ultrasounds and  transvaginal ultrasounds which were negative and she had a CT scan of  the abdomen and pelvis on this night of admission that was again  negative.   The patient evidently did have positive urine cultures or at least a  diagnosis of a chronic pyelonephritis because she has been taking  Levaquin 250 mg daily and was told she should take this for six weeks.   On talking with the patient, she gets very little relief with Toradol  with her pain. She reports that Percocet does  give her adequate relief.  She does not recall what other pain medication she may have tried.   PAST MEDICAL HISTORY:   SURGICAL:  1. Tonsillectomy, remote.  2. Tympanostomy tubes, remote.  3. Release of tear duct.   MEDICAL ILLNESSES:  1. Usual childhood diseases.  2. GERD.  3. History of pyelonephritis in 2006 with resulting chronic flank      pain.   CURRENT MEDICATIONS:  1. Yaz (OTC).  2. __________ 10 mg p.o. q.8h. p.r.n.  3. Levaquin 250 mg daily.   HABITS:  Tobacco, none. Alcohol, none.   ALLERGIES:  Patient has no known drug allergies.   FAMILY HISTORY:  Diabetes in first degree relative. Positive for  hypertension. Positive for lung cancer. Positive for cardiovascular  disease.   SOCIAL HISTORY:  The patient is a high Garment/textile technologist. Patient would  like to be in training in an EMT program. I believe she lives at home  with her parents.   PHYSICAL EXAMINATION:  VITAL SIGNS: Temperature was 97.6. Blood pressure  was 93/62. Pulse 54.  Respirations 16. O2 sat was 95 percent on room air.  GENERAL APPEARANCE: This is a well nourished, well groomed woman in no  acute distress but she is heavily sedated although able to speak with  the examiner.  HEENT: Grossly nonfocal.  NECK: Supple.  CHEST: With CVA tenderness.  LUNGS: Clear to auscultation and percussion.  CARDIOVASCULAR: 2+ radial pulses. Recording was monitored. She had a  regular rate and rhythm.  BREAST EXAM: Deferred.  ABDOMEN: The patient had positive bowel sounds. She had no guarding or  rebound. She had minimal tenderness to deep palpation at the right mid  gut area. No masses were appreciated.  PELVIC AND RECTAL: Deferred.  EXTREMITIES: Without clubbing, cyanosis, edema, deformity.  NEUROLOGIC: Patient is somnolent after having had pain medication.   DATA BASE:  CT scan of the abdomen and pelvis was unremarkable except  for a 3.9 cm cystic structure at the GE junction. The patient has had no  lab  work.   ASSESSMENT AND PLAN:  1. Flank pain. Patient with a long history over the last two years of      flank pain. It has not been diagnosed despite multiple consultants      and testing. Plan: The patient is to continue her outpatient      evaluation. She does have a follow up appointment with Dr. Kinnie Scales      and I would refer her to see Dr. Vernie Ammons.  2. Pain management. At this point the patient is admitted for pain      control. Will continue her Prilosec from home. Will also have her      take Percocet 5/325 one to two every six hours as needed for      discomfort and pain. Will continue IV fluids. Patient may benefit      if all the medical evaluations are negative from psychiatric      evaluation.      Rosalyn Gess Norins, MD  Electronically Signed     MEN/MEDQ  D:  02/11/2007  T:  02/12/2007  Job:  [id number]   cc:   Lelon Perla, DO  [address] [place], [place] [address]

## 2011-01-21 ENCOUNTER — Emergency Department (HOSPITAL_COMMUNITY)
Admission: EM | Admit: 2011-01-21 | Discharge: 2011-01-21 | Disposition: A | Discharge status: Home / Self Care | Payer: 59 | Attending: Emergency Medicine | Admitting: Emergency Medicine

## 2011-01-21 DIAGNOSIS — R300 Dysuria: Secondary | ICD-10-CM | POA: Insufficient documentation

## 2011-01-21 DIAGNOSIS — N3010 Interstitial cystitis (chronic) without hematuria: Secondary | ICD-10-CM | POA: Insufficient documentation

## 2011-01-21 DIAGNOSIS — F411 Generalized anxiety disorder: Secondary | ICD-10-CM | POA: Insufficient documentation

## 2011-01-21 DIAGNOSIS — IMO0002 Reserved for concepts with insufficient information to code with codable children: Secondary | ICD-10-CM | POA: Insufficient documentation

## 2011-01-21 LAB — COMPREHENSIVE METABOLIC PANEL
ALT: 9 U/L (ref 0–35)
AST: 14 U/L (ref 0–37)
CO2: 28 mEq/L (ref 19–32)
Calcium: 9.6 mg/dL (ref 8.4–10.5)
Chloride: 99 mEq/L (ref 96–112)
GFR calc Af Amer: >60 mL/min (ref >60)
GFR calc non Af Amer: >60 mL/min (ref >60)
Glucose, Bld: 89 mg/dL (ref 70–99)
Sodium: 136 mEq/L (ref 135–145)
Total Bilirubin: 0.3 mg/dL (ref 0.3–1.2)

## 2011-01-21 LAB — RAPID URINE DRUG SCREEN, HOSP PERFORMED
Barbiturates: NOT DETECTED
Benzodiazepines: POSITIVE — AB
Cocaine: POSITIVE — AB
Opiates: NOT DETECTED

## 2011-01-21 LAB — URINALYSIS, ROUTINE W REFLEX MICROSCOPIC
Glucose, UA: NEGATIVE mg/dL
Ketones, ur: NEGATIVE mg/dL
Protein, ur: NEGATIVE mg/dL
pH: 5.5 (ref 5.0–8.0)

## 2011-01-21 LAB — CBC
Hemoglobin: 13.2 g/dL (ref 12.0–15.0)
MCH: 28.4 pg (ref 26.0–34.0)
MCV: 88.6 fL (ref 78.0–100.0)
Platelets: 206 10*3/uL (ref 150–400)
RBC: 4.64 MIL/uL (ref 3.87–5.11)
WBC: 7.3 10*3/uL (ref 4.0–10.5)

## 2011-01-21 LAB — DIFFERENTIAL
Basophils Relative: 1 % (ref 0–1)
Lymphs Abs: 2.3 10*3/uL (ref 0.7–4.0)
Monocytes Relative: 9 % (ref 3–12)
Neutro Abs: 4.1 10*3/uL (ref 1.7–7.7)
Neutrophils Relative %: 55 % (ref 43–77)

## 2011-01-21 LAB — URINE MICROSCOPIC-ADD ON

## 2011-01-22 LAB — URINE MICROSCOPIC-ADD ON

## 2011-01-22 LAB — URINALYSIS, ROUTINE W REFLEX MICROSCOPIC
Glucose, UA: NEGATIVE mg/dL
Nitrite: NEGATIVE
Specific Gravity, Urine: 1.039 — ABNORMAL HIGH (ref 1.005–1.030)
pH: 5.5 (ref 5.0–8.0)

## 2011-02-14 ENCOUNTER — Emergency Department (HOSPITAL_COMMUNITY)
Admission: EM | Admit: 2011-02-14 | Discharge: 2011-02-15 | Disposition: A | Discharge status: Home / Self Care | Payer: 59 | Attending: Emergency Medicine | Admitting: Emergency Medicine

## 2011-02-14 ENCOUNTER — Emergency Department (HOSPITAL_COMMUNITY): Payer: 59

## 2011-02-14 DIAGNOSIS — M129 Arthropathy, unspecified: Secondary | ICD-10-CM | POA: Insufficient documentation

## 2011-02-14 DIAGNOSIS — Z79899 Other long term (current) drug therapy: Secondary | ICD-10-CM | POA: Insufficient documentation

## 2011-02-14 DIAGNOSIS — R350 Frequency of micturition: Secondary | ICD-10-CM | POA: Insufficient documentation

## 2011-02-14 DIAGNOSIS — R112 Nausea with vomiting, unspecified: Secondary | ICD-10-CM | POA: Insufficient documentation

## 2011-02-14 DIAGNOSIS — R109 Unspecified abdominal pain: Secondary | ICD-10-CM | POA: Insufficient documentation

## 2011-02-14 DIAGNOSIS — R300 Dysuria: Secondary | ICD-10-CM | POA: Insufficient documentation

## 2011-02-14 DIAGNOSIS — N949 Unspecified condition associated with female genital organs and menstrual cycle: Secondary | ICD-10-CM | POA: Insufficient documentation

## 2011-02-14 LAB — URINALYSIS, ROUTINE W REFLEX MICROSCOPIC
Bilirubin Urine: NEGATIVE
Glucose, UA: NEGATIVE mg/dL
Ketones, ur: NEGATIVE mg/dL
Nitrite: NEGATIVE
Protein, ur: NEGATIVE mg/dL
pH: 7.5 (ref 5.0–8.0)

## 2011-02-14 LAB — POCT PREGNANCY, URINE: Preg Test, Ur: NEGATIVE

## 2011-02-15 LAB — COMPREHENSIVE METABOLIC PANEL
ALT: 11 U/L (ref 0–35)
AST: 14 U/L (ref 0–37)
Albumin: 3.4 g/dL — ABNORMAL LOW (ref 3.5–5.2)
Alkaline Phosphatase: 46 U/L (ref 39–117)
GFR calc Af Amer: >60 mL/min (ref >60)
Glucose, Bld: 97 mg/dL (ref 70–99)
Potassium: 3.3 mEq/L — ABNORMAL LOW (ref 3.5–5.1)
Sodium: 138 mEq/L (ref 135–145)
Total Protein: 5.8 g/dL — ABNORMAL LOW (ref 6.0–8.3)

## 2011-02-15 LAB — DIFFERENTIAL
Basophils Absolute: 0 10*3/uL (ref 0.0–0.1)
Basophils Relative: 0 % (ref 0–1)
Eosinophils Absolute: 0.4 10*3/uL (ref 0.0–0.7)
Monocytes Absolute: 0.7 10*3/uL (ref 0.1–1.0)
Neutro Abs: 5.7 10*3/uL (ref 1.7–7.7)
Neutrophils Relative %: 60 % (ref 43–77)

## 2011-02-15 LAB — CBC
Hemoglobin: 11.9 g/dL — ABNORMAL LOW (ref 12.0–15.0)
MCHC: 32.8 g/dL (ref 30.0–36.0)
Platelets: 158 10*3/uL (ref 150–400)

## 2011-02-16 ENCOUNTER — Emergency Department (HOSPITAL_COMMUNITY): Payer: 59

## 2011-02-16 ENCOUNTER — Emergency Department (HOSPITAL_COMMUNITY)
Admission: EM | Admit: 2011-02-16 | Discharge: 2011-02-16 | Disposition: A | Discharge status: Home / Self Care | Payer: 59 | Attending: Emergency Medicine | Admitting: Emergency Medicine

## 2011-02-16 ENCOUNTER — Encounter (HOSPITAL_COMMUNITY): Payer: Self-pay | Admitting: Emergency Medicine

## 2011-02-16 DIAGNOSIS — W108XXA Fall (on) (from) other stairs and steps, initial encounter: Secondary | ICD-10-CM | POA: Insufficient documentation

## 2011-02-16 DIAGNOSIS — R51 Headache: Secondary | ICD-10-CM | POA: Insufficient documentation

## 2011-02-16 DIAGNOSIS — M545 Low back pain, unspecified: Secondary | ICD-10-CM | POA: Insufficient documentation

## 2011-02-16 DIAGNOSIS — S0990XA Unspecified injury of head, initial encounter: Secondary | ICD-10-CM | POA: Insufficient documentation

## 2011-02-16 DIAGNOSIS — Z87442 Personal history of urinary calculi: Secondary | ICD-10-CM | POA: Insufficient documentation

## 2011-02-16 DIAGNOSIS — F1010 Alcohol abuse, uncomplicated: Secondary | ICD-10-CM | POA: Insufficient documentation

## 2011-02-16 DIAGNOSIS — M542 Cervicalgia: Secondary | ICD-10-CM | POA: Insufficient documentation

## 2011-02-16 DIAGNOSIS — F10929 Alcohol use, unspecified with intoxication, unspecified: Secondary | ICD-10-CM

## 2011-02-16 DIAGNOSIS — F411 Generalized anxiety disorder: Secondary | ICD-10-CM | POA: Insufficient documentation

## 2011-02-16 DIAGNOSIS — Z9181 History of falling: Secondary | ICD-10-CM

## 2011-02-16 DIAGNOSIS — R5381 Other malaise: Secondary | ICD-10-CM | POA: Insufficient documentation

## 2011-02-16 DIAGNOSIS — R55 Syncope and collapse: Secondary | ICD-10-CM | POA: Insufficient documentation

## 2011-02-16 DIAGNOSIS — F17200 Nicotine dependence, unspecified, uncomplicated: Secondary | ICD-10-CM | POA: Insufficient documentation

## 2011-02-16 HISTORY — DX: Anxiety disorder, unspecified: F41.9

## 2011-02-16 LAB — DIFFERENTIAL
Basophils Absolute: 0 10*3/uL (ref 0.0–0.1)
Basophils Relative: 1 % (ref 0–1)
Eosinophils Relative: 3 % (ref 0–5)
Monocytes Absolute: 0.4 10*3/uL (ref 0.1–1.0)
Monocytes Relative: 6 % (ref 3–12)

## 2011-02-16 LAB — BASIC METABOLIC PANEL
BUN: 3 mg/dL — ABNORMAL LOW (ref 6–23)
Calcium: 8.7 mg/dL (ref 8.4–10.5)
Creatinine, Ser: 0.64 mg/dL (ref 0.50–1.10)
GFR calc Af Amer: >60 mL/min (ref >60)

## 2011-02-16 LAB — CBC
HCT: 40 % (ref 36.0–46.0)
Hemoglobin: 13.1 g/dL (ref 12.0–15.0)
MCH: 28.5 pg (ref 26.0–34.0)
MCHC: 32.8 g/dL (ref 30.0–36.0)
MCV: 87.1 fL (ref 78.0–100.0)
RDW: 13.1 % (ref 11.5–15.5)

## 2011-02-16 MED ORDER — OXYCODONE-ACETAMINOPHEN 5-325 MG PO TABS
1.0000 | ORAL_TABLET | Freq: Once | ORAL | Status: AC
  Administered 2011-02-16: 1 via ORAL
  Filled 2011-02-16: qty 1

## 2011-02-16 MED ORDER — SODIUM CHLORIDE 0.9 % IV BOLUS (SEPSIS)
2000.0000 mL | Freq: Once | INTRAVENOUS | Status: AC
Start: 2011-02-16 — End: 2011-02-17
  Administered 2011-02-16: 2000 mL via INTRAVENOUS

## 2011-02-16 NOTE — ED Notes (Signed)
Pt getting belligerent stating " my mom is your boss she is the head of [employer], you need to listen to me", pt reminded she cant get pain medications until the doctor orders it.  Charge nurse notified of pt's belligerence.

## 2011-02-16 NOTE — ED Notes (Signed)
Pt again reminded to ly flat in bed pt sitting up in bed walking around the room. Pt screaming she had to peee and refusing a bed pan, bed side commode brought in. Pt repeating " trust me my mom is a nurse i know".

## 2011-02-16 NOTE — ED Notes (Signed)
Pt continues to be verbally abusive to staff and her mother, pt reminded multiple time that she needs to lower her voice security called to bedside. Pt threatening to pull out her iv. RN removed iv.

## 2011-02-16 NOTE — ED Provider Notes (Signed)
History  Scribed for Dr. Patria Mane, the patient was seen in room 3. The chart was scribed by Gilman Schmidt. The patients care was started at 2124.  CSN: [id number] Arrival date & time: 02/16/2011  8:12 PM  No chief complaint on file.  The history is provided by the patient.   [name] is a 25 y.o. female who presents to the Emergency Department complaining of head and back pain after falling down a flight of stairs while drinking. States she may have lost consciousness. Additionally, patient reports that she feels weak on left side. Denies any nausea, vomiting or pain in legs. No abdominal pain. Pt drinking ETOH tonight. Reports she tripped. Denies numbness or tingling  HPI ELEMENTS:  Location: neck and back Duration: consistent since onset Timing: constant  Context: as above  Associated symptoms: Denies any nausea, vomiting of pain in legs.    PAST MEDICAL HISTORY:  Past Medical History  Diagnosis Date  . Gastric reflux   . Anxiety   . Basedow's disease   . Pyelonephritis   . History of nephrolithiasis   . ADD (attention deficit hyperactivity disorder, inattentive type)   . Anxiety disorder      PAST SURGICAL HISTORY:  Past Surgical History  Procedure Date  . Tonsillectomy      MEDICATIONS:  Previous Medications   ALPRAZOLAM (XANAX PO)    Take by mouth.    ALPRAZOLAM (XANAX) 1 MG TABLET    Take 1 mg by mouth 4 (four) times daily as needed. For anxiety    ARIPIPRAZOLE (ABILIFY) 10 MG TABLET    Take 10 mg by mouth daily.     FLUOXETINE (PROZAC) 20 MG CAPSULE    Take 20 mg by mouth daily.     GABAPENTIN (NEURONTIN) 300 MG CAPSULE    Take 300 mg by mouth. 1 at bedtime may increase slowly to three times a day    HYDROCODONE-ACETAMINOPHEN PO    Take 1 tablet by mouth every 4 (four) hours as needed. Every 4 to 6 hours as needed.    MELOXICAM (MOBIC) 15 MG TABLET    Take 15 mg by mouth daily.     MISC. DEVICES (CRUTCH) MISC    as directed.     TROSPIUM CHLORIDE (SANCTURA XR)  60 MG CP24    Take 1 capsule by mouth daily.       ALLERGIES:  Allergies as of 02/16/2011  . (No Known Allergies)     FAMILY HISTORY:  Family History  Problem Relation Age of Onset  . Diabetes    . Hypertension    . Lung cancer       SOCIAL HISTORY: History  Substance Use Topics  . Smoking status: Current Everyday Smoker -- 1.0 packs/day  . Smokeless tobacco: Never Used  . Alcohol Use: Yes      Review of Systems  Gastrointestinal: Negative for nausea and vomiting.  Musculoskeletal: Positive for back pain.  Neurological: Positive for syncope, weakness and headaches.  All other systems reviewed and are negative.    Physical Exam  BP 104/81  Pulse 108  Temp(Src) 98.1 F (36.7 C) (Oral)  Resp 16  Ht 5\' 6"  (1.676 m)  Wt 120 lb (54.432 kg)  BMI 19.37 kg/m2  SpO2 100%  Physical Exam  Nursing note and vitals reviewed. Constitutional: She is oriented to person, place, and time. She appears well-developed and well-nourished.       Slurred speech  HENT:  Head: Normocephalic and atraumatic.  Right  Ear: External ear normal.  Left Ear: External ear normal.  Eyes: Conjunctivae and EOM are normal. Pupils are equal, round, and reactive to light.  Neck: Phonation normal. Neck supple.       imobolized in C collar. C spine and paraspinal cervical tenderness  Cardiovascular: Normal rate, regular rhythm, normal heart sounds and intact distal pulses.   Pulmonary/Chest: Effort normal and breath sounds normal. She exhibits no bony tenderness.  Abdominal: Soft. Normal appearance. There is no tenderness. There is no rebound and no guarding.  Musculoskeletal: She exhibits no tenderness.       Cervical back: She exhibits tenderness and bony tenderness. She exhibits no deformity and no spasm.       Thoracic back: She exhibits no tenderness.       Lumbar back: Tenderness: upper lumbar.       Mild C-spine Tenderness No Step Off ROM full in lower extremities  Neurological: She is  alert and oriented to person, place, and time. She has normal strength. No sensory deficit.  Skin: Skin is warm, dry and intact.  Psychiatric: She has a normal mood and affect.   OTHER DATA REVIEWED: Nursing notes, vital signs, and past medical records reviewed.   DIAGNOSTIC STUDIES: Oxygen Saturation is 100% on room air, normal by my interpretation.    LABS:  Results for orders placed during the hospital encounter of 02/16/11  CBC      Component Value Range   WBC 7.8  4.0 - 10.5 (K/uL)   RBC 4.59  3.87 - 5.11 (MIL/uL)   Hemoglobin 13.1  12.0 - 15.0 (g/dL)   HCT 10.9  60.4 - 54.0 (%)   MCV 87.1  78.0 - 100.0 (fL)   MCH 28.5  26.0 - 34.0 (pg)   MCHC 32.8  30.0 - 36.0 (g/dL)   RDW 98.1  19.1 - 47.8 (%)   Platelets 222  150 - 400 (K/uL)  DIFFERENTIAL      Component Value Range   Neutrophils Relative 47  43 - 77 (%)   Neutro Abs 3.7  1.7 - 7.7 (K/uL)   Lymphocytes Relative 44  12 - 46 (%)   Lymphs Abs 3.4  0.7 - 4.0 (K/uL)   Monocytes Relative 6  3 - 12 (%)   Monocytes Absolute 0.4  0.1 - 1.0 (K/uL)   Eosinophils Relative 3  0 - 5 (%)   Eosinophils Absolute 0.2  0.0 - 0.7 (K/uL)   Basophils Relative 1  0 - 1 (%)   Basophils Absolute 0.0  0.0 - 0.1 (K/uL)  BASIC METABOLIC PANEL      Component Value Range   Sodium 139  135 - 145 (mEq/L)   Potassium 4.0  3.5 - 5.1 (mEq/L)   Chloride 102  96 - 112 (mEq/L)   CO2 29  19 - 32 (mEq/L)   Glucose, Bld 97  70 - 99 (mg/dL)   BUN 3 (*) 6 - 23 (mg/dL)   Creatinine, Ser 2.95  0.50 - 1.10 (mg/dL)   Calcium 8.7  8.4 - 62.1 (mg/dL)   GFR calc non Af Amer >60  >60 (mL/min)   GFR calc Af Amer >60  >60 (mL/min)      RADIOLOGY:  Dg Lumbar Spine Complete  02/16/2011  *RADIOLOGY REPORT*  Clinical Data: Low back pain after falling down stairs  LUMBAR SPINE - COMPLETE 4+ VIEW  Comparison: 09/17/2010  Findings: Five lumbar type vertebra.  Mild lumbar scoliosis convex towards the left.  Otherwise normal alignment  of the lumbar spine. No abnormal  anterior subluxation.  No vertebral compression deformities.  Intervertebral disc space heights are preserved. Normal alignment of the facet joints.  No significant change since previous study.  IMPRESSION: No displaced fractures identified.  Original Report Authenticated By: Marlon Pel, M.D.   Ct Head Wo Contrast  02/16/2011  *RADIOLOGY REPORT*  Clinical Data:  Low back pain and posterior headache after fall.  CT HEAD WITHOUT CONTRAST CT CERVICAL SPINE WITHOUT CONTRAST  Technique:  Multidetector CT imaging of the head and cervical spine was performed following the standard protocol without intravenous contrast.  Multiplanar CT image reconstructions of the cervical spine were also generated.  Comparison:  CT head 09/17/2010, CT head and neck 10/10/2008  CT HEAD  Findings: Ventricles and sulci appear symmetrical.  No mass effect or midline shift.  No abnormal extra-axial fluid collections.  Wallace Cullens- white matter junctions are distinct.  Basal cisterns are not effaced.  No evidence of acute intracranial hemorrhage.  Ventricles are not dilated.  Visualized paranasal sinuses are not opacified. No depressed skull fractures.  IMPRESSION: No acute intracranial abnormalities demonstrated.  CT CERVICAL SPINE  Findings: There is straightening of the usual cervical lordosis which probably relates to patient positioning but ligamentous injury or muscle spasm can have this appearance.  No abnormal anterior subluxation.  No prevertebral soft tissue swelling.  No vertebral compression deformities.  Intervertebral disc space heights are preserved.  Posterior elements and facet joints demonstrate normal alignment.  Lateral masses of C1 are symmetrical.  The odontoid process appears intact.  No significant infiltration into the paraspinal soft tissues. Bone cortex and trabecular architecture appear intact.  IMPRESSION: Straightening of the usual cervical lordosis which might be due to patient positioning although ligamentous  injury or muscle spasm can have this appearance.  No displaced fractures identified.  Original Report Authenticated By: Marlon Pel, M.D.   Ct Cervical Spine Wo Contrast  02/16/2011  *RADIOLOGY REPORT*  Clinical Data:  Low back pain and posterior headache after fall.  CT HEAD WITHOUT CONTRAST CT CERVICAL SPINE WITHOUT CONTRAST  Technique:  Multidetector CT imaging of the head and cervical spine was performed following the standard protocol without intravenous contrast.  Multiplanar CT image reconstructions of the cervical spine were also generated.  Comparison:  CT head 09/17/2010, CT head and neck 10/10/2008  CT HEAD  Findings: Ventricles and sulci appear symmetrical.  No mass effect or midline shift.  No abnormal extra-axial fluid collections.  Wallace Cullens- white matter junctions are distinct.  Basal cisterns are not effaced.  No evidence of acute intracranial hemorrhage.  Ventricles are not dilated.  Visualized paranasal sinuses are not opacified. No depressed skull fractures.  IMPRESSION: No acute intracranial abnormalities demonstrated.  CT CERVICAL SPINE  Findings: There is straightening of the usual cervical lordosis which probably relates to patient positioning but ligamentous injury or muscle spasm can have this appearance.  No abnormal anterior subluxation.  No prevertebral soft tissue swelling.  No vertebral compression deformities.  Intervertebral disc space heights are preserved.  Posterior elements and facet joints demonstrate normal alignment.  Lateral masses of C1 are symmetrical.  The odontoid process appears intact.  No significant infiltration into the paraspinal soft tissues. Bone cortex and trabecular architecture appear intact.  IMPRESSION: Straightening of the usual cervical lordosis which might be due to patient positioning although ligamentous injury or muscle spasm can have this appearance.  No displaced fractures identified.  Original Report Authenticated By: Marlon Pel, M.D.  MDM:  Mechanical fall, likely from intoxication. Pain treated in ER. CT and plain films negative. Will dc home with antiinflammatory meds  IMPRESSION: Diagnoses that have been ruled out:  Diagnoses that are still under consideration:  Final diagnoses:  History of fall  Neck pain  Low back pain  Alcohol intoxication  Minor head injury   ED Course: 2124: Patient evaluated by ED physician, UA, Drug Screen, labs, Percocet ordered 2249: Recheck by ED physician. Treatment discussed with patient and mothers. Pt request pain meds   PLAN:  Home Nonsteroidals The patient is to return the emergency department if there is any worsening of symptoms. I have reviewed the discharge instructions with the patient and parent  CONDITION ON DISCHARGE: Good  MEDICATIONS GIVEN IN THE E.D.  Medications  sodium chloride 0.9 % bolus 2,000 mL (2000 mL Intravenous Given 02/16/11 2241)  ALPRAZolam (XANAX) 1 MG tablet (not administered)  HYDROCODONE-ACETAMINOPHEN PO (not administered)  Trospium Chloride (SANCTURA XR) 60 MG CP24 (not administered)  oxyCODONE-acetaminophen (PERCOCET) 5-325 MG per tablet 1 tablet (1 tablet Oral Given 02/16/11 2122)    DISCHARGE MEDICATIONS: New Prescriptions   No medications on file    SCRIBE ATTESTATION: I personally performed the services described in this documentation, which was scribed in my presence. The recorded information has been reviewed and considered. Lyanne Co, MD    Procedures        Lyanne Co, MD 02/16/11 [phone]

## 2011-02-16 NOTE — ED Notes (Signed)
Pt agreed to leave and go home with her mother. Pt self ambulated out wih=th a steady gait

## 2011-02-16 NOTE — ED Notes (Signed)
Assist md with taking pt off the spine board no neurologic deficits noted prior to or after movement.

## 2011-02-16 NOTE — ED Notes (Signed)
Pt very verbal stating she wants her mom. Pt advocate did contact her mother and she is coming in. Pt refusing to lay flat despite multiple statements to her about c-spine percautions.

## 2011-03-04 LAB — URINE MICROSCOPIC-ADD ON

## 2011-03-04 LAB — URINE CULTURE: Culture: NO GROWTH

## 2011-03-04 LAB — URINALYSIS, ROUTINE W REFLEX MICROSCOPIC
Bilirubin Urine: NEGATIVE
Glucose, UA: NEGATIVE
Ketones, ur: NEGATIVE
Nitrite: NEGATIVE
Protein, ur: NEGATIVE
pH: 6.5

## 2011-03-04 LAB — POCT PREGNANCY, URINE
Operator id: 29459
Preg Test, Ur: NEGATIVE

## 2011-03-08 LAB — COMPREHENSIVE METABOLIC PANEL
Albumin: 3.8
Alkaline Phosphatase: 39
BUN: 4 — ABNORMAL LOW
CO2: 25
Chloride: 101
Creatinine, Ser: 0.79
GFR calc non Af Amer: >60
Glucose, Bld: 83
Potassium: 3.7
Total Bilirubin: 0.7

## 2011-03-08 LAB — URINALYSIS, ROUTINE W REFLEX MICROSCOPIC
Nitrite: NEGATIVE
Protein, ur: NEGATIVE
Urobilinogen, UA: 1

## 2011-03-08 LAB — WET PREP, GENITAL
Trich, Wet Prep: NONE SEEN
Yeast Wet Prep HPF POC: NONE SEEN

## 2011-03-08 LAB — POCT URINALYSIS DIP (DEVICE)
Ketones, ur: 40 — AB
Operator id: 116391
Specific Gravity, Urine: >=1.03

## 2011-03-08 LAB — LIPASE, BLOOD: Lipase: 17

## 2011-03-08 LAB — DIFFERENTIAL
Basophils Absolute: 0.1
Basophils Relative: 1
Monocytes Absolute: 1.3 — ABNORMAL HIGH
Neutro Abs: 9 — ABNORMAL HIGH
Neutrophils Relative %: 71

## 2011-03-08 LAB — CBC
HCT: 37.9
Hemoglobin: 12.7
MCV: 84.3
RBC: 4.49
WBC: 12.6 — ABNORMAL HIGH

## 2011-03-08 LAB — POCT PREGNANCY, URINE: Operator id: 116391

## 2011-03-08 LAB — URINE CULTURE

## 2011-03-08 LAB — URINE MICROSCOPIC-ADD ON

## 2011-03-10 LAB — CBC
RBC: 4.33
WBC: 11.8 — ABNORMAL HIGH

## 2011-03-10 LAB — BASIC METABOLIC PANEL
Calcium: 8.9
Creatinine, Ser: 0.77
GFR calc Af Amer: >60

## 2011-03-10 LAB — OCCULT BLOOD X 1 CARD TO LAB, STOOL: Fecal Occult Bld: NEGATIVE

## 2011-03-10 LAB — DIFFERENTIAL
Basophils Relative: 1
Lymphs Abs: 2.3
Monocytes Relative: 10
Neutro Abs: 8.1 — ABNORMAL HIGH
Neutrophils Relative %: 68

## 2011-03-12 LAB — URINALYSIS, ROUTINE W REFLEX MICROSCOPIC
Glucose, UA: NEGATIVE
Nitrite: NEGATIVE
Protein, ur: NEGATIVE
pH: 7

## 2011-03-12 LAB — URINE MICROSCOPIC-ADD ON

## 2011-03-12 LAB — POCT PREGNANCY, URINE: Operator id: 29452

## 2011-03-12 LAB — URINE CULTURE

## 2011-03-15 LAB — URINALYSIS, ROUTINE W REFLEX MICROSCOPIC
Glucose, UA: NEGATIVE
Leukocytes, UA: NEGATIVE
Protein, ur: NEGATIVE
Specific Gravity, Urine: 1.017

## 2011-03-15 LAB — BASIC METABOLIC PANEL
CO2: 24
Calcium: 9.4
Creatinine, Ser: 0.79
GFR calc Af Amer: >60
GFR calc non Af Amer: >60
Sodium: 143

## 2011-03-15 LAB — URINE MICROSCOPIC-ADD ON: RBC / HPF: NONE SEEN

## 2011-03-15 LAB — DIFFERENTIAL
Lymphocytes Relative: 20
Lymphs Abs: 1.9
Monocytes Absolute: 0.6
Monocytes Relative: 7
Neutro Abs: 6.7
Neutrophils Relative %: 71

## 2011-03-15 LAB — CBC
Hemoglobin: 12.8
RBC: 4.71
WBC: 9.4

## 2011-03-17 ENCOUNTER — Emergency Department (HOSPITAL_COMMUNITY)
Admission: EM | Admit: 2011-03-17 | Discharge: 2011-03-18 | Disposition: A | Discharge status: Other Acute Inpatient Hospital | Payer: 59 | Attending: Emergency Medicine | Admitting: Emergency Medicine

## 2011-03-17 DIAGNOSIS — F3289 Other specified depressive episodes: Secondary | ICD-10-CM | POA: Insufficient documentation

## 2011-03-17 DIAGNOSIS — IMO0002 Reserved for concepts with insufficient information to code with codable children: Secondary | ICD-10-CM | POA: Insufficient documentation

## 2011-03-17 DIAGNOSIS — R109 Unspecified abdominal pain: Secondary | ICD-10-CM | POA: Insufficient documentation

## 2011-03-17 DIAGNOSIS — N390 Urinary tract infection, site not specified: Secondary | ICD-10-CM | POA: Insufficient documentation

## 2011-03-17 DIAGNOSIS — F329 Major depressive disorder, single episode, unspecified: Secondary | ICD-10-CM | POA: Insufficient documentation

## 2011-03-17 LAB — COMPREHENSIVE METABOLIC PANEL
Alkaline Phosphatase: 46 U/L (ref 39–117)
BUN: 9 mg/dL (ref 6–23)
Chloride: 101 mEq/L (ref 96–112)
GFR calc Af Amer: >90 mL/min (ref >90)
Glucose, Bld: 83 mg/dL (ref 70–99)
Potassium: 3.3 mEq/L — ABNORMAL LOW (ref 3.5–5.1)
Total Bilirubin: 0.4 mg/dL (ref 0.3–1.2)
Total Protein: 7 g/dL (ref 6.0–8.3)

## 2011-03-17 LAB — URINALYSIS, ROUTINE W REFLEX MICROSCOPIC
Glucose, UA: NEGATIVE mg/dL
Ketones, ur: 15 mg/dL — AB
Protein, ur: 100 mg/dL — AB
Urobilinogen, UA: 0.2 mg/dL (ref 0.0–1.0)

## 2011-03-17 LAB — DIFFERENTIAL
Basophils Relative: 0 % (ref 0–1)
Eosinophils Absolute: 0.6 10*3/uL (ref 0.0–0.7)
Eosinophils Relative: 7 % — ABNORMAL HIGH (ref 0–5)
Lymphs Abs: 2.8 10*3/uL (ref 0.7–4.0)
Monocytes Relative: 10 % (ref 3–12)
Neutrophils Relative %: 49 % (ref 43–77)

## 2011-03-17 LAB — CBC
MCH: 28.7 pg (ref 26.0–34.0)
MCHC: 32.7 g/dL (ref 30.0–36.0)
MCV: 87.7 fL (ref 78.0–100.0)
Platelets: 205 10*3/uL (ref 150–400)
RDW: 13.3 % (ref 11.5–15.5)

## 2011-03-17 LAB — URINE MICROSCOPIC-ADD ON

## 2011-03-17 LAB — RAPID URINE DRUG SCREEN, HOSP PERFORMED
Amphetamines: POSITIVE — AB
Benzodiazepines: POSITIVE — AB
Cocaine: NOT DETECTED

## 2011-03-17 LAB — ETHANOL: Alcohol, Ethyl (B): <11 mg/dL (ref 0–11)

## 2011-03-18 ENCOUNTER — Inpatient Hospital Stay (HOSPITAL_COMMUNITY)
Admission: AD | Admit: 2011-03-18 | Discharge: 2011-03-22 | DRG: 885 | Disposition: A | Discharge status: Home / Self Care | Payer: 59 | Attending: Psychiatry | Admitting: Psychiatry

## 2011-03-18 ENCOUNTER — Emergency Department (HOSPITAL_COMMUNITY): Payer: 59

## 2011-03-18 DIAGNOSIS — R45851 Suicidal ideations: Secondary | ICD-10-CM

## 2011-03-18 DIAGNOSIS — Z560 Unemployment, unspecified: Secondary | ICD-10-CM

## 2011-03-18 DIAGNOSIS — N3010 Interstitial cystitis (chronic) without hematuria: Secondary | ICD-10-CM

## 2011-03-18 DIAGNOSIS — F339 Major depressive disorder, recurrent, unspecified: Principal | ICD-10-CM

## 2011-03-18 DIAGNOSIS — Z79899 Other long term (current) drug therapy: Secondary | ICD-10-CM

## 2011-03-18 DIAGNOSIS — Z880 Allergy status to penicillin: Secondary | ICD-10-CM

## 2011-03-18 DIAGNOSIS — IMO0002 Reserved for concepts with insufficient information to code with codable children: Secondary | ICD-10-CM

## 2011-03-19 DIAGNOSIS — F329 Major depressive disorder, single episode, unspecified: Secondary | ICD-10-CM

## 2011-03-19 LAB — URINALYSIS, ROUTINE W REFLEX MICROSCOPIC
Ketones, ur: NEGATIVE mg/dL
Nitrite: NEGATIVE
Protein, ur: NEGATIVE mg/dL
Urobilinogen, UA: 0.2 mg/dL (ref 0.0–1.0)
pH: 6.5 (ref 5.0–8.0)

## 2011-03-19 LAB — COMPREHENSIVE METABOLIC PANEL
Alkaline Phosphatase: 31 U/L — ABNORMAL LOW (ref 39–117)
BUN: 2 mg/dL — ABNORMAL LOW (ref 6–23)
Chloride: 105 mEq/L (ref 96–112)
Glucose, Bld: 86 mg/dL (ref 70–99)
Potassium: 3.7 mEq/L (ref 3.5–5.1)
Total Bilirubin: 0.5 mg/dL (ref 0.3–1.2)

## 2011-03-19 LAB — DIFFERENTIAL
Basophils Absolute: 0 10*3/uL (ref 0.0–0.1)
Basophils Relative: 1 % (ref 0–1)
Eosinophils Absolute: 0.4 10*3/uL (ref 0.0–0.7)
Monocytes Absolute: 0.5 10*3/uL (ref 0.1–1.0)
Neutro Abs: 3 10*3/uL (ref 1.7–7.7)
Neutrophils Relative %: 45 % (ref 43–77)

## 2011-03-19 LAB — POCT I-STAT, CHEM 8
Calcium, Ion: 1.16 mmol/L (ref 1.12–1.32)
Chloride: 104 mEq/L (ref 96–112)
HCT: 40 % (ref 36.0–46.0)
Hemoglobin: 13.6 g/dL (ref 12.0–15.0)
Potassium: 3.7 mEq/L (ref 3.5–5.1)

## 2011-03-19 LAB — CBC
HCT: 38.7 % (ref 36.0–46.0)
Hemoglobin: 12.4 g/dL (ref 12.0–15.0)
RBC: 4.54 MIL/uL (ref 3.87–5.11)
WBC: 6.8 10*3/uL (ref 4.0–10.5)

## 2011-03-19 LAB — LIPASE, BLOOD: Lipase: 22 U/L (ref 11–59)

## 2011-03-21 LAB — RPR: RPR Ser Ql: NONREACTIVE

## 2011-03-23 ENCOUNTER — Ambulatory Visit (INDEPENDENT_AMBULATORY_CARE_PROVIDER_SITE_OTHER): Payer: 59 | Admitting: Physician Assistant

## 2011-03-23 DIAGNOSIS — F1910 Other psychoactive substance abuse, uncomplicated: Secondary | ICD-10-CM
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
## 2011-03-26 LAB — URINALYSIS, ROUTINE W REFLEX MICROSCOPIC
Bilirubin Urine: NEGATIVE
Glucose, UA: NEGATIVE
Ketones, ur: 15 — AB
Leukocytes, UA: NEGATIVE
Nitrite: NEGATIVE
Protein, ur: NEGATIVE
Specific Gravity, Urine: 1.022
Urobilinogen, UA: 0.2
pH: 6.5

## 2011-03-26 LAB — BASIC METABOLIC PANEL
BUN: 4 — ABNORMAL LOW
CO2: 22
Calcium: 7.9 — ABNORMAL LOW
GFR calc non Af Amer: >60
Glucose, Bld: 95

## 2011-03-26 LAB — URINE MICROSCOPIC-ADD ON

## 2011-03-26 LAB — PREGNANCY, URINE: Preg Test, Ur: NEGATIVE

## 2011-03-28 ENCOUNTER — Inpatient Hospital Stay (INDEPENDENT_AMBULATORY_CARE_PROVIDER_SITE_OTHER)
Admission: RE | Admit: 2011-03-28 | Discharge: 2011-03-28 | Disposition: A | Discharge status: Home / Self Care | Payer: 59 | Source: Ambulatory Visit | Attending: Family Medicine | Admitting: Family Medicine

## 2011-03-28 ENCOUNTER — Encounter: Payer: Self-pay | Admitting: Family Medicine

## 2011-03-28 DIAGNOSIS — J029 Acute pharyngitis, unspecified: Secondary | ICD-10-CM

## 2011-03-28 DIAGNOSIS — N3000 Acute cystitis without hematuria: Secondary | ICD-10-CM

## 2011-03-28 DIAGNOSIS — H60339 Swimmer's ear, unspecified ear: Secondary | ICD-10-CM

## 2011-03-28 DIAGNOSIS — R300 Dysuria: Secondary | ICD-10-CM

## 2011-03-28 DIAGNOSIS — M26629 Arthralgia of temporomandibular joint, unspecified side: Secondary | ICD-10-CM

## 2011-03-28 LAB — CONVERTED CEMR LAB
Glucose, Urine, Semiquant: NEGATIVE
Ketones, urine, test strip: NEGATIVE
Nitrite: POSITIVE
Rapid Strep: NEGATIVE
Urobilinogen, UA: 1
pH: 5.5
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
## 2011-03-31 LAB — COMPREHENSIVE METABOLIC PANEL
AST: 13
BUN: 3 — ABNORMAL LOW
CO2: 27
Calcium: 9.5
Chloride: 103
Creatinine, Ser: 0.68
GFR calc Af Amer: >60
GFR calc non Af Amer: >60
Total Bilirubin: 0.4

## 2011-03-31 LAB — URINALYSIS, ROUTINE W REFLEX MICROSCOPIC
Bilirubin Urine: NEGATIVE
Glucose, UA: NEGATIVE
Hgb urine dipstick: NEGATIVE
Protein, ur: NEGATIVE
Urobilinogen, UA: 0.2

## 2011-03-31 LAB — DIFFERENTIAL
Basophils Absolute: 0
Lymphocytes Relative: 24
Lymphs Abs: 2.6
Neutro Abs: 7.5

## 2011-03-31 LAB — CBC
HCT: 40.2
MCHC: 32.7
MCV: 83.3
Platelets: 194
RBC: 4.83
WBC: 10.9 — ABNORMAL HIGH

## 2011-03-31 LAB — LIPASE, BLOOD: Lipase: 19

## 2011-04-05 ENCOUNTER — Telehealth (INDEPENDENT_AMBULATORY_CARE_PROVIDER_SITE_OTHER): Payer: Self-pay | Admitting: Emergency Medicine

## 2011-04-08 ENCOUNTER — Ambulatory Visit (HOSPITAL_COMMUNITY): Payer: 59 | Admitting: Psychology

## 2011-04-12 ENCOUNTER — Encounter (HOSPITAL_COMMUNITY): Payer: 59 | Admitting: Physician Assistant

## 2011-04-19 ENCOUNTER — Encounter (HOSPITAL_COMMUNITY): Payer: 59 | Admitting: Physician Assistant

## 2011-05-17 NOTE — Progress Notes (Signed)
Summary: EARACHE/?UTI (room 5)   Vital Signs:  Patient Profile:   25 Years Old Female CC:      left ear pain and dysuria Height:     66 inches Weight:      120 pounds O2 Sat:      99 % O2 treatment:    Room Air Temp:     98 degrees F oral Pulse rate:   88 / minute Resp:     16 per minute BP sitting:   103 / 67  (left arm) Cuff size:   regular  Pt. in pain?   yes    Location:   left ear  Vitals Entered By: Lavell Islam RN (March 28, 2011 1:13 PM)                   Updated Prior Medication List: PROZAC 20 MG CAPS (FLUOXETINE HCL) by mouth once daily ABILIFY 10 MG TABS (ARIPIPRAZOLE) 1 by mouth once daily * CLONAZEPAM 1 MG daily  Current Allergies: No known allergies History of Present Illness Chief Complaint: left ear pain and dysuria History of Present Illness:  Subjective: Patient complains of sore throat and left earache that started yesterday.  She also complains of dysuria, frequency, and hesitancy; she just finished Levaquin 4 days ago for UTI. No cough No pleuritic pain No wheezing No nasal congestion No post-nasal drainage No sinus pain/pressure No itchy/red eyes No hemoptysis No SOB No fever/chills No nausea No vomiting No abdominal pain No diarrhea No skin rashes + fatigue No myalgias No headache Used OTC meds without relief   REVIEW OF SYSTEMS Constitutional Symptoms      Denies fever, chills, night sweats, weight loss, weight gain, and fatigue.  Eyes       Denies change in vision, eye pain, eye discharge, glasses, contact lenses, and eye surgery. Ear/Nose/Throat/Mouth       Complains of ear pain and sore throat.      Denies hearing loss/aids, change in hearing, ear discharge, dizziness, frequent runny nose, frequent nose bleeds, sinus problems, hoarseness, and tooth pain or bleeding.      Comments: left (hx tubes) Respiratory       Denies dry cough, productive cough, wheezing, shortness of breath, asthma, bronchitis, and  emphysema/COPD.  Cardiovascular       Denies murmurs, chest pain, and tires easily with exhertion.    Gastrointestinal       Denies stomach pain, nausea/vomiting, diarrhea, constipation, blood in bowel movements, and indigestion. Genitourniary       Complains of painful urination.      Denies kidney stones and loss of urinary control. Neurological       Denies paralysis, seizures, and fainting/blackouts. Musculoskeletal       Denies muscle pain, joint pain, joint stiffness, decreased range of motion, redness, swelling, muscle weakness, and gout.  Skin       Denies bruising, unusual mles/lumps or sores, and hair/skin or nail changes.  Psych       Denies mood changes, temper/anger issues, anxiety/stress, speech problems, depression, and sleep problems. Other Comments: left ear pain, dysuria, sore throat   Past History:  Past Medical History: Reviewed history from 05/28/2008 and no changes required. Gastric Reflux anxiety basedow's disease pyelonephritis, nos Nephrolithiasis, hx of  Past Surgical History: Reviewed history from 07/06/2007 and no changes required. Tonsillectomy Ear tubes irrigation to ears  Family History: Reviewed history from 12/01/2006 and no changes required. Family History Diabetes 1st degree relative Family History Hypertension Family  History Lung cancer Family History of Cardiovascular disorder  Social History: Reviewed history from 12/04/2009 and no changes required. works as a Hospital doctor for [employer]   Objective:  Appearance:  Patient appears healthy, stated age, and in no acute distress  Eyes:  Pupils are equal, round, and reactive to light and accomodation.  Extraocular movement is intact.  Conjunctivae are not inflamed.  Ears:  Left canal slightly erythematous.  Tympanic membranes normal.  Distinct tenderness over left temporomandibular joint  Nose:  Mildly congested turbinates.  No sinus tenderness  Pharynx:  Minimal erythema Neck:   Supple.  Slightly tender shotty posterior nodes are palpated bilaterally.  Lungs:  Clear to auscultation.  Breath sounds are equal.  Heart:  Regular rate and rhythm without murmurs, rubs, or gallops.  Abdomen:  Nontender without masses or hepatosplenomegaly.  Bowel sounds are present.  No CVA or flank tenderness.  urinalysis (dipstick):  trace leuks; + nit, trace blood Rapid strep test negative  Assessment New Problems: ACUTE CYSTITIS (ICD-595.0) ACUTE PHARYNGITIS (ICD-462) OTITIS EXTERNA, ACUTE, LEFT (ICD-380.12) TMJ PAIN (ICD-524.62) DYSURIA (ICD-788.1)  ? OTITIS EXTERNA LEFT, VS LEFT TMJ PAIN  Plan New Medications/Changes: PYRIDIUM 200 MG TABS (PHENAZOPYRIDINE HCL) 1 by mouth three times a day pc  #6 x 0, 03/28/2011, Donna Christen MD LEVAQUIN 500 MG TABS (LEVOFLOXACIN) One by mouth once every 24 hours for 7 days  #7 x 0, 03/28/2011, Donna Christen MD CORTISPORIN 3.5-10000-1 SOLN [facility]) Place 4 gtts in affected ear three times a day to qid  #10cc x 0, 03/28/2011, Donna Christen MD  New Orders: Rapid Strep [16109] T-Culture, Throat [[id number]] Urinalysis [CPT-81003] T-Culture, Urine [[id number]] New Patient Level IV [99204] Services provided After hours-Weekends-Holidays [99051] Planning Comments:   Urine culture and throat culture pending. Begin Levaquin and Pyridium.   Increase fluid intake.  Begin Cortisporin otic susp. Follow-up with PCP if not improving.    The patient and/or caregiver has been counseled thoroughly with regard to medications prescribed including dosage, schedule, interactions, rationale for use, and possible side effects and they verbalize understanding.  Diagnoses and expected course of recovery discussed and will return if not improved as expected or if the condition worsens. Patient and/or caregiver verbalized understanding.  Prescriptions: PYRIDIUM 200 MG TABS (PHENAZOPYRIDINE HCL) 1 by mouth three times a day pc  #6 x 0   Entered  and Authorized by:   Donna Christen MD   Signed by:   Donna Christen MD on 03/28/2011   Method used:   Print then Give to Patient   RxID:   [id number] LEVAQUIN 500 MG TABS (LEVOFLOXACIN) One by mouth once every 24 hours for 7 days  #7 x 0   Entered and Authorized by:   Donna Christen MD   Signed by:   Donna Christen MD on 03/28/2011   Method used:   Print then Give to Patient   RxID:   [id number] CORTISPORIN 3.5-10000-1 SOLN [place]) Place 4 gtts in affected ear three times a day to qid  #10cc x 0   Entered and Authorized by:   Donna Christen MD   Signed by:   Donna Christen MD on 03/28/2011   Method used:   Print then Give to Patient   RxID:   [id number]   Orders Added: 1)  Rapid Strep [56433] 2)  T-Culture, Throat [[id number]] 3)  Urinalysis [CPT-81003] 4)  T-Culture, Urine [[id number]] 5)  New Patient Level IV [32355] 6)  Services provided After hours-Weekends-Holidays [99051]    Laboratory Results  Urine Tests  Date/Time Received: March 28, 2011 4:36 PM  Date/Time Reported: March 28, 2011 4:36 PM   Routine Urinalysis   Color: yellow Appearance: Cloudy Glucose: negative   (Normal Range: Negative) Bilirubin: negative   (Normal Range: Negative) Ketone: negative   (Normal Range: Negative) Spec. Gravity: 1.020   (Normal Range: 1.003-1.035) Blood: trace-intact   (Normal Range: Negative) pH: 5.5   (Normal Range: 5.0-8.0) Protein: negative   (Normal Range: Negative) Urobilinogen: 1.0   (Normal Range: 0-1) Nitrite: positive   (Normal Range: Negative) Leukocyte Esterace: trace   (Normal Range: Negative)    Date/Time Received: March 28, 2011 4:40 PM  Date/Time Reported: March 28, 2011 4:40 PM   Other Tests  Rapid Strep: negative  Kit Test Internal QC: Negative   (Normal Range: Negative)

## 2011-05-17 NOTE — Telephone Encounter (Signed)
  Phone Note Outgoing Call Call back at [facility] Phone [phone]   Call placed by: Emilio Math,  April 05, 2011 5:52 PM Call placed to: Patient Summary of Call: Left msg hope she is feeling better advised her to finish all meds. Call with any questions or concerns

## 2011-05-25 ENCOUNTER — Ambulatory Visit (INDEPENDENT_AMBULATORY_CARE_PROVIDER_SITE_OTHER): Payer: 59 | Admitting: Psychology

## 2011-05-25 DIAGNOSIS — F411 Generalized anxiety disorder: Secondary | ICD-10-CM

## 2011-05-25 DIAGNOSIS — F419 Anxiety disorder, unspecified: Secondary | ICD-10-CM

## 2011-05-25 NOTE — Progress Notes (Signed)
   THERAPIST PROGRESS NOTE  Session Time: 0930 - 1030  Participation Level: Active  Behavioral Response: Well GroomedAlertAnxious  Type of Therapy: Individual Therapy  Treatment Goals addressed: Coping and Diagnosis: Anxiety and it's treatment  Interventions: Supportive, Reframing and Other: Meridian Tapping  Summary: Monica BARLOWE is a 25 y.o. female who presents with "having a good day", but bothered by constant fine tremor and wonders if it is the medication or her underlying anxiety. We reviewed and updated her medication list.  She has stopped all the medications with potential for addiction as well as stopping use of alcohol and has "mostly" given up partying.  She lives with her grandmother, a great aunt and uncle and "I love living there".  She is working full-time, at a [employer] in the [employer] and "love(s) the people I work with. It is fun."  One of her goals is to be able to keep a full time job, so she is one month toward that goal.  Brady has varied affect, fine tremors of her hands that she tries to hide, and good eye contact.  She is also concerned about ADD, something she had been treated for in the past.  She finds that she has to concentrate very hard to remain attentive to our conversation and she has a hard time sitting still.  She responded well to the initial rounds of Meridian tapping, with her anxiety level moving from 5 down to 3 and she stated she felt it was very effective.  Suicidal/Homicidal: Nowithout intent/plan  Therapist Response:  Updated medication list with her and verified dosage changes.  She reports she is medication compliant.  I reviewed her history, assessed her current needs and decided to teach her how to use Meridian Tapping for her anxiety.  She accepted the teaching, followed me through several rounds of tapping with good results and reported she was pleased to learn something she could do other than have another pill.  I gave her  written instructions as well as web site information and cautioned her not to pay for anything, as the method information is all available free of charge.  I suggested she try to practice this 5-10 min once or twice a day and we talked about a variety of problems including inattention that she might use this for.    She is asking for a psychiatrist in our office as her mother would prefer she stay within the [facility] system.  There is a considerable waiting period for new patient appointments, but we will go ahead and get her one.  In the mean time, I will request that her PCP continue her medications as she is taking them now.  Plan: Return again in 4-6 weeks.  Continue with Meridian tapping for her treatment  Diagnosis: Axis I: Anxiety Disorder NOS    Axis II: Deferred    Pinchos Topel, RN 05/25/2011

## 2011-06-01 ENCOUNTER — Emergency Department (HOSPITAL_COMMUNITY)
Admission: EM | Admit: 2011-06-01 | Discharge: 2011-06-01 | Disposition: A | Discharge status: Home / Self Care | Payer: 59 | Attending: Emergency Medicine | Admitting: Emergency Medicine

## 2011-06-01 ENCOUNTER — Emergency Department (HOSPITAL_COMMUNITY): Payer: 59

## 2011-06-01 ENCOUNTER — Encounter (HOSPITAL_COMMUNITY): Payer: Self-pay | Admitting: Emergency Medicine

## 2011-06-01 DIAGNOSIS — K219 Gastro-esophageal reflux disease without esophagitis: Secondary | ICD-10-CM | POA: Insufficient documentation

## 2011-06-01 DIAGNOSIS — F17200 Nicotine dependence, unspecified, uncomplicated: Secondary | ICD-10-CM | POA: Insufficient documentation

## 2011-06-01 DIAGNOSIS — F988 Other specified behavioral and emotional disorders with onset usually occurring in childhood and adolescence: Secondary | ICD-10-CM | POA: Insufficient documentation

## 2011-06-01 DIAGNOSIS — B349 Viral infection, unspecified: Secondary | ICD-10-CM

## 2011-06-01 DIAGNOSIS — R109 Unspecified abdominal pain: Secondary | ICD-10-CM | POA: Insufficient documentation

## 2011-06-01 DIAGNOSIS — R112 Nausea with vomiting, unspecified: Secondary | ICD-10-CM | POA: Insufficient documentation

## 2011-06-01 DIAGNOSIS — Z79899 Other long term (current) drug therapy: Secondary | ICD-10-CM | POA: Insufficient documentation

## 2011-06-01 DIAGNOSIS — B9789 Other viral agents as the cause of diseases classified elsewhere: Secondary | ICD-10-CM | POA: Insufficient documentation

## 2011-06-01 LAB — URINALYSIS, ROUTINE W REFLEX MICROSCOPIC
Glucose, UA: NEGATIVE mg/dL
Ketones, ur: NEGATIVE mg/dL
Protein, ur: NEGATIVE mg/dL
Urobilinogen, UA: 0.2 mg/dL (ref 0.0–1.0)

## 2011-06-01 LAB — URINE MICROSCOPIC-ADD ON

## 2011-06-01 MED ORDER — SODIUM CHLORIDE 0.9 % IV BOLUS (SEPSIS)
1000.0000 mL | Freq: Once | INTRAVENOUS | Status: AC
  Administered 2011-06-01: 1000 mL via INTRAVENOUS

## 2011-06-01 MED ORDER — ONDANSETRON HCL 4 MG/2ML IJ SOLN
4.0000 mg | Freq: Once | INTRAMUSCULAR | Status: AC
  Administered 2011-06-01: 4 mg via INTRAVENOUS
  Filled 2011-06-01: qty 2

## 2011-06-01 MED ORDER — PROMETHAZINE HCL 25 MG PO TABS: 25.0000 mg | ORAL_TABLET | Freq: Four times a day (QID) | ORAL | Status: DC | PRN

## 2011-06-01 MED ORDER — KETOROLAC TROMETHAMINE 30 MG/ML IJ SOLN
30.0000 mg | Freq: Once | INTRAMUSCULAR | Status: AC
  Administered 2011-06-01: 30 mg via INTRAVENOUS
  Filled 2011-06-01 (×2): qty 1

## 2011-06-01 NOTE — ED Notes (Signed)
Patient complaining of severe right abdominal pain starting yesterday. States she is vomiting and unable to keep anything down today.

## 2011-06-01 NOTE — ED Provider Notes (Signed)
History   Scribed for [employer]. Colon Branch, MD, the patient was seen in room APA04/APA04 . This chart was scribed by Lewanda Rife.   CSN: [id number] Arrival date & time: 06/01/2011  8:42 PM   First MD Initiated Contact with Patient 06/01/11 2047      Chief Complaint  Patient presents with  . Abdominal Pain  . Emesis    (Consider location/radiation/quality/duration/timing/severity/associated sxs/prior treatment) HPI [name] is a 25 y.o. female who presents to the Emergency Department complaining of constant upper abdominal pain since yesterday. Pt reports nausea, vomiting and has been unable to keep anything down today. Pt describes the abdominal pain as if she feels like her stomach is  "exploding," but denies associated diarrhea. Has taken no medicines. Reports smoking   PCP  Dr. Laury Axon    Past Medical History  Diagnosis Date  . Gastric reflux   . Anxiety   . Basedow's disease   . Pyelonephritis   . History of nephrolithiasis   . ADD (attention deficit hyperactivity disorder, inattentive type)   . Anxiety disorder     Past Surgical History  Procedure Date  . Tonsillectomy     Family History  Problem Relation Age of Onset  . Diabetes    . Hypertension    . Lung cancer      History  Substance Use Topics  . Smoking status: Current Everyday Smoker -- 1.0 packs/day  . Smokeless tobacco: Never Used  . Alcohol Use: Yes    OB History    Grav Para Term Preterm Abortions TAB SAB Ect Mult Living                  Review of Systems 10 Systems reviewed and are negative for acute change except as noted in the HPI. Allergies  Penicillins  Home Medications   Current Outpatient Rx  Name Route Sig Dispense Refill  . ARIPIPRAZOLE 5 MG PO TABS Oral Take 5 mg by mouth daily. HS     . FLUOXETINE HCL 40 MG PO CAPS Oral Take 40 mg by mouth daily.      . IBUPROFEN 600 MG PO TABS Oral Take 600 mg by mouth every 6 (six) hours as needed.      Marland Kitchen PHENAZOPYRIDINE  HCL 95 MG PO TABS Oral Take 95 mg by mouth 3 (three) times daily as needed.      . TROSPIUM CHLORIDE ER 60 MG PO CP24 Oral Take 1 capsule by mouth daily.       Triage VS BP 112/66  Pulse 98  Temp(Src) 98.3 F (36.8 C) (Oral)  Resp 16  Ht 5\' 6"  (1.676 m)  Wt 125 lb (56.7 kg)  BMI 20.18 kg/m2  SpO2 100%  LMP 05/11/2011  Physical Exam  Constitutional: She is oriented to person, place, and time. She appears well-developed and well-nourished.  HENT:  Head: Normocephalic and atraumatic.  Mouth/Throat: Oropharynx is clear and moist.  Eyes: EOM are normal.  Cardiovascular: Exam reveals no gallop and no friction rub.   No murmur heard. Pulmonary/Chest: Effort normal and breath sounds normal.       Lungs are clear  Abdominal: Soft. Bowel sounds are normal. There is tenderness (epigastric tenderness).  Neurological: She is alert and oriented to person, place, and time.  Skin: Skin is warm and dry.    ED Course  Procedures (including critical care time) Oxygen saturation is 98% on room air, normal by my interpretation.   Results for orders placed during the  hospital encounter of 06/01/11  URINALYSIS, ROUTINE W REFLEX MICROSCOPIC      Component Value Range   Color, Urine STRAW (*) YELLOW    APPearance CLEAR  CLEAR    Specific Gravity, Urine <1.005 (*) 1.005 - 1.030    pH 6.0  5.0 - 8.0    Glucose, UA NEGATIVE  NEGATIVE (mg/dL)   Hgb urine dipstick SMALL (*) NEGATIVE    Bilirubin Urine NEGATIVE  NEGATIVE    Ketones, ur NEGATIVE  NEGATIVE (mg/dL)   Protein, ur NEGATIVE  NEGATIVE (mg/dL)   Urobilinogen, UA 0.2  0.0 - 1.0 (mg/dL)   Nitrite NEGATIVE  NEGATIVE    Leukocytes, UA NEGATIVE  NEGATIVE   POCT PREGNANCY, URINE      Component Value Range   Preg Test, Ur NEGATIVE    URINE MICROSCOPIC-ADD ON      Component Value Range   WBC, UA 0-2  <3 (WBC/hpf)   RBC / HPF 0-2  <3 (RBC/hpf)   Bacteria, UA RARE  RARE    No results found.     MDM  Patient with nausea and vomiting x  2 days. Urine unremarkable. Given IVF, antiemetic and antiinflammatory with improvement.  She has taken PO fluids. Pt feels improved after observation and/or treatment in ED.Pt stable in ED with no significant deterioration in condition.The patient appears reasonably screened and/or stabilized for discharge and I doubt any other medical condition or other [facility] requiring further screening, evaluation, or treatment in the ED at this time prior to discharge.  I personally performed the services described in this documentation, which was scribed in my presence. The recorded information has been reviewed and considered.   MDM Interpretation: labs     Nicoletta Dress. Colon Branch, MD 06/01/11 2302

## 2011-06-16 ENCOUNTER — Emergency Department (HOSPITAL_BASED_OUTPATIENT_CLINIC_OR_DEPARTMENT_OTHER)
Admission: EM | Admit: 2011-06-16 | Discharge: 2011-06-17 | Disposition: A | Discharge status: Home / Self Care | Payer: 59 | Attending: Emergency Medicine | Admitting: Emergency Medicine

## 2011-06-16 ENCOUNTER — Encounter (HOSPITAL_BASED_OUTPATIENT_CLINIC_OR_DEPARTMENT_OTHER): Payer: Self-pay | Admitting: Emergency Medicine

## 2011-06-16 ENCOUNTER — Emergency Department (INDEPENDENT_AMBULATORY_CARE_PROVIDER_SITE_OTHER): Payer: 59

## 2011-06-16 DIAGNOSIS — F3289 Other specified depressive episodes: Secondary | ICD-10-CM | POA: Insufficient documentation

## 2011-06-16 DIAGNOSIS — F17200 Nicotine dependence, unspecified, uncomplicated: Secondary | ICD-10-CM | POA: Insufficient documentation

## 2011-06-16 DIAGNOSIS — M79609 Pain in unspecified limb: Secondary | ICD-10-CM

## 2011-06-16 DIAGNOSIS — R1011 Right upper quadrant pain: Secondary | ICD-10-CM | POA: Insufficient documentation

## 2011-06-16 DIAGNOSIS — F10929 Alcohol use, unspecified with intoxication, unspecified: Secondary | ICD-10-CM

## 2011-06-16 DIAGNOSIS — X58XXXA Exposure to other specified factors, initial encounter: Secondary | ICD-10-CM

## 2011-06-16 DIAGNOSIS — F411 Generalized anxiety disorder: Secondary | ICD-10-CM | POA: Insufficient documentation

## 2011-06-16 DIAGNOSIS — F329 Major depressive disorder, single episode, unspecified: Secondary | ICD-10-CM | POA: Insufficient documentation

## 2011-06-16 DIAGNOSIS — E0500 Thyrotoxicosis with diffuse goiter without thyrotoxic crisis or storm: Secondary | ICD-10-CM | POA: Insufficient documentation

## 2011-06-16 DIAGNOSIS — K219 Gastro-esophageal reflux disease without esophagitis: Secondary | ICD-10-CM | POA: Insufficient documentation

## 2011-06-16 DIAGNOSIS — F1010 Alcohol abuse, uncomplicated: Secondary | ICD-10-CM | POA: Insufficient documentation

## 2011-06-16 DIAGNOSIS — S6990XA Unspecified injury of unspecified wrist, hand and finger(s), initial encounter: Secondary | ICD-10-CM

## 2011-06-16 DIAGNOSIS — S60229A Contusion of unspecified hand, initial encounter: Secondary | ICD-10-CM | POA: Insufficient documentation

## 2011-06-16 DIAGNOSIS — S60222A Contusion of left hand, initial encounter: Secondary | ICD-10-CM

## 2011-06-16 LAB — DIFFERENTIAL
Eosinophils Relative: 3 % (ref 0–5)
Lymphocytes Relative: 35 % (ref 12–46)
Lymphs Abs: 2.9 10*3/uL (ref 0.7–4.0)
Neutrophils Relative %: 51 % (ref 43–77)

## 2011-06-16 LAB — URINALYSIS, ROUTINE W REFLEX MICROSCOPIC
Glucose, UA: NEGATIVE mg/dL
Specific Gravity, Urine: 1.01 (ref 1.005–1.030)
pH: 6.5 (ref 5.0–8.0)

## 2011-06-16 LAB — RAPID URINE DRUG SCREEN, HOSP PERFORMED
Amphetamines: NOT DETECTED
Benzodiazepines: NOT DETECTED
Opiates: POSITIVE — AB

## 2011-06-16 LAB — BASIC METABOLIC PANEL
CO2: 29 mEq/L (ref 19–32)
Glucose, Bld: 93 mg/dL (ref 70–99)
Potassium: 3.7 mEq/L (ref 3.5–5.1)
Sodium: 145 mEq/L (ref 135–145)

## 2011-06-16 LAB — CBC
MCV: 85.7 fL (ref 78.0–100.0)
Platelets: 190 10*3/uL (ref 150–400)
RBC: 4.48 MIL/uL (ref 3.87–5.11)
WBC: 8.2 10*3/uL (ref 4.0–10.5)

## 2011-06-16 MED ORDER — ZOLPIDEM TARTRATE 5 MG PO TABS
5.0000 mg | ORAL_TABLET | Freq: Every evening | ORAL | Status: DC | PRN
  Filled 2011-06-16: qty 1

## 2011-06-16 MED ORDER — ONDANSETRON HCL 4 MG PO TABS
4.0000 mg | ORAL_TABLET | Freq: Three times a day (TID) | ORAL | Status: DC | PRN
  Administered 2011-06-17: 4 mg via ORAL
  Filled 2011-06-16: qty 1

## 2011-06-16 MED ORDER — LORAZEPAM 1 MG PO TABS
1.0000 mg | ORAL_TABLET | Freq: Three times a day (TID) | ORAL | Status: DC | PRN
  Administered 2011-06-17 (×2): 1 mg via ORAL
  Filled 2011-06-16 (×2): qty 1

## 2011-06-16 MED ORDER — NICOTINE 21 MG/24HR TD PT24
21.0000 mg | MEDICATED_PATCH | Freq: Every day | TRANSDERMAL | Status: DC
  Filled 2011-06-16: qty 1

## 2011-06-16 MED ORDER — ALUM & MAG HYDROXIDE-SIMETH 200-200-20 MG/5ML PO SUSP: 30.0000 mL | ORAL | Status: DC | PRN

## 2011-06-16 MED ORDER — ACETAMINOPHEN 325 MG PO TABS: 650.0000 mg | ORAL_TABLET | ORAL | Status: DC | PRN

## 2011-06-16 MED ORDER — IBUPROFEN 600 MG PO TABS: 600.0000 mg | ORAL_TABLET | Freq: Three times a day (TID) | ORAL | Status: DC | PRN

## 2011-06-16 NOTE — ED Notes (Signed)
Pt being driven to [facility] ED by mother.
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
## 2011-06-17 ENCOUNTER — Other Ambulatory Visit: Payer: Self-pay

## 2011-06-17 MED ORDER — ARIPIPRAZOLE 5 MG PO TABS: 5.0000 mg | ORAL_TABLET | Freq: Every day | ORAL | Status: DC

## 2011-06-17 MED ORDER — CLONAZEPAM 1 MG PO TABS: 1.0000 mg | ORAL_TABLET | Freq: Two times a day (BID) | ORAL | Status: DC | PRN

## 2011-06-17 MED ORDER — FLUOXETINE HCL 40 MG PO CAPS: 40.0000 mg | ORAL_CAPSULE | Freq: Every day | ORAL | Status: DC

## 2011-06-17 NOTE — ED Notes (Addendum)
Pt BIB mother from [facility] ED requesting "help", pt states she has been off her meds x 1 mo and does not have an appointment to see another MD. Pt reports anger issues and was dropped by her psychiatrist for that reason. Pt drank 1 pt canadian mist liquor tonight with some friends. Pt reports feeling anxious and not wanting to be alone. Pt also reports feeling depressed however vague about why she is feeling depressed, just states I have a lot of things going on in my head. Pt denies SI/HI/AHVH just states "I want help with how I am feeling and to get back on my meds".
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
## 2011-06-17 NOTE — ED Notes (Signed)
Patient up at nurses station calling her employer. Asked patient if she would like a shower. Patient stated "I want one but I want to lay back down and sleep some more." Told patient to let me know when she would like to take her shower.

## 2011-06-17 NOTE — ED Notes (Signed)
Telespsych consult requested and ordered

## 2011-06-17 NOTE — ED Notes (Signed)
Telepsych recommends discharge from the ED.  EDP notified

## 2011-06-17 NOTE — ED Notes (Signed)
Act in to speak with pt

## 2011-06-17 NOTE — ED Provider Notes (Signed)
Medical screening examination/treatment/procedure(s) were performed by non-physician practitioner and as supervising physician I was immediately available for consultation/collaboration.  Lavelle Berland K Mulan Adan-Rasch, MD 06/17/11 0227 

## 2011-06-17 NOTE — ED Provider Notes (Signed)
She was seen by the psychiatrist via telemetry. I felt that she could go home on the same medicines that she is on plus Klonopin to use when necessary anxiety. [facility]. 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  Flint Melter, MD 06/17/11 1901

## 2011-06-17 NOTE — ED Provider Notes (Signed)
Pt received in transfer from [facility], with request for voluntary admission to [facility]. Please see Dr. Reynolds Bowl note. I was asked to see the pt and medically screen as she was briefly upset and belligerent toward the staff in [facility] triage. States she has had depression for some time, and has not been taking medication for a month. Denies SI, vague HI.  She is cooperative and calm, stating "I just want to go to sleep now."  She was given a wrist splint at [facility] which is helping with her pain. Otherwise denies pain or other acute medical issues. She is generally healthy.  Physical Exam  BP 112/68  Pulse 94  Temp(Src) 97.9 F (36.6 C) (Oral)  Resp 18  SpO2 100%  LMP 06/01/2011  Physical Exam  Nursing note and vitals reviewed. Constitutional: She is oriented to person, place, and time. She appears well-developed and well-nourished. No distress.  HENT:  Head: Normocephalic and atraumatic.  Eyes: Conjunctivae and EOM are normal. Pupils are equal, round, and reactive to light.  Cardiovascular: Normal rate and regular rhythm.   Pulmonary/Chest: Effort normal and breath sounds normal.  Abdominal: Soft. There is no tenderness.  Musculoskeletal:       Cockup wrist splint in place to L wrist; neurovasc intact distally with sensory intact to lt touch; cap refill <3.  Neurological: She is alert and oriented to person, place, and time.  Skin: Skin is warm and dry. She is not diaphoretic.  Psychiatric: Her speech is normal and behavior is normal. She exhibits a depressed mood. She expresses no homicidal and no suicidal ideation.       Is cooperative with my exam, mentating appropriately.    ED Course  Procedures  Pt is cooperative and calm at this time; medically clear to move to [facility] Psych ED. Discussed with ACT, will consult.      Grant Fontana, [place] 06/17/11 [phone]

## 2011-06-17 NOTE — ED Notes (Signed)
Pt complains of pain in mid chest x nine hrs.  Pain is described as sharp, non radiating, continuous.  Complained of nausea earlier that was relieved by by zofran.  Did not complain of pain earlier in the day.  Pt denies any cough.  Ate 100% of lunch.  Rates pain 7/10.

## 2011-06-17 NOTE — ED Notes (Signed)
Belongings moved to locker 43 in [place].

## 2011-07-01 ENCOUNTER — Ambulatory Visit (HOSPITAL_COMMUNITY): Payer: 59 | Admitting: Psychology

## 2011-07-01 DIAGNOSIS — F411 Generalized anxiety disorder: Secondary | ICD-10-CM

## 2011-07-06 ENCOUNTER — Ambulatory Visit (INDEPENDENT_AMBULATORY_CARE_PROVIDER_SITE_OTHER): Payer: 59 | Admitting: Psychology

## 2011-07-06 DIAGNOSIS — F411 Generalized anxiety disorder: Secondary | ICD-10-CM
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
## 2011-07-12 ENCOUNTER — Other Ambulatory Visit (HOSPITAL_COMMUNITY): Payer: Self-pay

## 2011-07-13 ENCOUNTER — Other Ambulatory Visit (HOSPITAL_COMMUNITY): Payer: Self-pay

## 2011-07-13 ENCOUNTER — Other Ambulatory Visit (HOSPITAL_COMMUNITY): Payer: Self-pay | Admitting: Psychology

## 2011-07-13 DIAGNOSIS — F411 Generalized anxiety disorder: Secondary | ICD-10-CM

## 2011-07-13 MED ORDER — CLONAZEPAM 1 MG PO TABS: 1.0000 mg | ORAL_TABLET | Freq: Two times a day (BID) | ORAL | Status: DC | PRN

## 2011-07-13 MED ORDER — FLUOXETINE HCL 40 MG PO CAPS: 40.0000 mg | ORAL_CAPSULE | Freq: Every day | ORAL | Status: DC

## 2011-07-13 MED ORDER — ARIPIPRAZOLE 5 MG PO TABS: 5.0000 mg | ORAL_TABLET | Freq: Every day | ORAL | Status: DC

## 2011-07-14 ENCOUNTER — Emergency Department (HOSPITAL_COMMUNITY): Payer: 59

## 2011-07-14 ENCOUNTER — Emergency Department (HOSPITAL_COMMUNITY)
Admission: EM | Admit: 2011-07-14 | Discharge: 2011-07-15 | Disposition: A | Discharge status: Home / Self Care | Payer: 59 | Attending: Emergency Medicine | Admitting: Emergency Medicine

## 2011-07-14 ENCOUNTER — Encounter (HOSPITAL_COMMUNITY): Payer: Self-pay | Admitting: Emergency Medicine

## 2011-07-14 DIAGNOSIS — R109 Unspecified abdominal pain: Secondary | ICD-10-CM | POA: Insufficient documentation

## 2011-07-14 DIAGNOSIS — N390 Urinary tract infection, site not specified: Secondary | ICD-10-CM | POA: Insufficient documentation

## 2011-07-14 DIAGNOSIS — F988 Other specified behavioral and emotional disorders with onset usually occurring in childhood and adolescence: Secondary | ICD-10-CM | POA: Insufficient documentation

## 2011-07-14 DIAGNOSIS — F411 Generalized anxiety disorder: Secondary | ICD-10-CM | POA: Insufficient documentation

## 2011-07-14 DIAGNOSIS — F17200 Nicotine dependence, unspecified, uncomplicated: Secondary | ICD-10-CM | POA: Insufficient documentation

## 2011-07-14 DIAGNOSIS — R10813 Right lower quadrant abdominal tenderness: Secondary | ICD-10-CM | POA: Insufficient documentation

## 2011-07-14 DIAGNOSIS — Z79899 Other long term (current) drug therapy: Secondary | ICD-10-CM | POA: Insufficient documentation

## 2011-07-14 DIAGNOSIS — K219 Gastro-esophageal reflux disease without esophagitis: Secondary | ICD-10-CM | POA: Insufficient documentation

## 2011-07-14 LAB — DIFFERENTIAL
Basophils Absolute: 0 10*3/uL (ref 0.0–0.1)
Lymphocytes Relative: 26 % (ref 12–46)
Monocytes Absolute: 0.8 10*3/uL (ref 0.1–1.0)
Monocytes Relative: 8 % (ref 3–12)
Neutro Abs: 7.1 10*3/uL (ref 1.7–7.7)

## 2011-07-14 LAB — URINALYSIS, ROUTINE W REFLEX MICROSCOPIC
Glucose, UA: NEGATIVE mg/dL
Specific Gravity, Urine: 1.012 (ref 1.005–1.030)
pH: 5.5 (ref 5.0–8.0)

## 2011-07-14 LAB — CBC
HCT: 41.4 % (ref 36.0–46.0)
Hemoglobin: 13.8 g/dL (ref 12.0–15.0)
MCH: 28.5 pg (ref 26.0–34.0)
MCHC: 33.3 g/dL (ref 30.0–36.0)
MCV: 85.4 fL (ref 78.0–100.0)
Platelets: 211 10*3/uL (ref 150–400)
RBC: 4.85 MIL/uL (ref 3.87–5.11)
RDW: 13.2 % (ref 11.5–15.5)
WBC: 11 10*3/uL — ABNORMAL HIGH (ref 4.0–10.5)

## 2011-07-14 LAB — URINE MICROSCOPIC-ADD ON

## 2011-07-14 LAB — BASIC METABOLIC PANEL
BUN: 7 mg/dL (ref 6–23)
CO2: 24 mEq/L (ref 19–32)
Chloride: 99 mEq/L (ref 96–112)
Creatinine, Ser: 0.85 mg/dL (ref 0.50–1.10)

## 2011-07-14 LAB — PREGNANCY, URINE: Preg Test, Ur: NEGATIVE

## 2011-07-14 MED ORDER — PROMETHAZINE HCL 25 MG/ML IJ SOLN
12.5000 mg | Freq: Once | INTRAMUSCULAR | Status: AC
  Administered 2011-07-14: 12.5 mg via INTRAVENOUS
  Filled 2011-07-14: qty 1

## 2011-07-14 MED ORDER — SODIUM CHLORIDE 0.9 % IV SOLN
Freq: Once | INTRAVENOUS | Status: AC
  Administered 2011-07-14: 23:00:00 via INTRAVENOUS

## 2011-07-14 MED ORDER — MORPHINE SULFATE 4 MG/ML IJ SOLN
4.0000 mg | Freq: Once | INTRAMUSCULAR | Status: AC
  Administered 2011-07-14: 4 mg via INTRAVENOUS
  Filled 2011-07-14: qty 1

## 2011-07-14 MED ORDER — ONDANSETRON HCL 4 MG/2ML IJ SOLN
4.0000 mg | Freq: Once | INTRAMUSCULAR | Status: AC
  Administered 2011-07-14: 4 mg via INTRAVENOUS
  Filled 2011-07-14: qty 2

## 2011-07-14 MED ORDER — KETOROLAC TROMETHAMINE 30 MG/ML IJ SOLN
30.0000 mg | Freq: Once | INTRAMUSCULAR | Status: AC
  Administered 2011-07-14: 30 mg via INTRAVENOUS
  Filled 2011-07-14: qty 1

## 2011-07-14 NOTE — ED Notes (Signed)
Pt alert, nad, c/o rlq abd pain, onset one day ago, denies bowel or bladder habits, c/o nausea, no emesis, skin pwd, resp even unlabored

## 2011-07-14 NOTE — ED Provider Notes (Signed)
History     CSN: [id number]  Arrival date & time 07/14/11  2121   First MD Initiated Contact with Patient 07/14/11 2223      Chief Complaint  Patient presents with  . Abdominal Pain    (Consider location/radiation/quality/duration/timing/severity/associated sxs/prior treatment) Patient is a 26 y.o. female presenting with abdominal pain. The history is provided by the patient.  Abdominal Pain The primary symptoms of the illness include abdominal pain. The primary symptoms of the illness do not include fever, fatigue, nausea, vomiting, diarrhea, dysuria or vaginal discharge. The current episode started yesterday. The problem has been gradually worsening.  The patient states that she believes she is currently not pregnant. The patient has not had a change in bowel habit. Symptoms associated with the illness do not include chills, anorexia or hematuria.    Past Medical History  Diagnosis Date  . Gastric reflux   . Anxiety   . Basedow's disease   . Pyelonephritis   . History of nephrolithiasis   . ADD (attention deficit hyperactivity disorder, inattentive type)   . Anxiety disorder     Past Surgical History  Procedure Date  . Tonsillectomy     Family History  Problem Relation Age of Onset  . Diabetes    . Hypertension    . Lung cancer      History  Substance Use Topics  . Smoking status: Current Everyday Smoker -- 1.0 packs/day  . Smokeless tobacco: Never Used  . Alcohol Use: Yes    OB History    Grav Para Term Preterm Abortions TAB SAB Ect Mult Living                  Review of Systems  Constitutional: Negative for fever, chills and fatigue.  Gastrointestinal: Positive for abdominal pain. Negative for nausea, vomiting, diarrhea and anorexia.  Genitourinary: Negative for dysuria, hematuria and vaginal discharge.  All other systems reviewed and are negative.    Allergies  Darvocet; Neurontin; and Penicillins  Home Medications   Current Outpatient Rx    Name Route Sig Dispense Refill  . ARIPIPRAZOLE 5 MG PO TABS Oral Take 1 tablet (5 mg total) by mouth daily. 30 tablet 0  . CLONAZEPAM 1 MG PO TABS Oral Take 1 tablet (1 mg total) by mouth 2 (two) times daily as needed for anxiety. 60 tablet 0  . FLUOXETINE HCL 40 MG PO CAPS Oral Take 1 capsule (40 mg total) by mouth daily. 30 capsule 0  . PHENAZOPYRIDINE HCL 95 MG PO TABS Oral Take 95 mg by mouth 3 (three) times daily as needed. Urinary tract relief      BP 112/75  Pulse 115  Temp(Src) 98.1 F (36.7 C) (Oral)  Resp 20  Wt 130 lb (58.968 kg)  SpO2 95%  LMP 06/16/2011  Physical Exam  Nursing note and vitals reviewed. Constitutional: She is oriented to person, place, and time. She appears well-developed and well-nourished. No distress.  HENT:  Head: Normocephalic and atraumatic.  Neck: Normal range of motion. Neck supple.  Cardiovascular: Normal rate and regular rhythm.  Exam reveals no gallop and no friction rub.   No murmur heard. Pulmonary/Chest: Effort normal and breath sounds normal. No respiratory distress. She has no wheezes.  Abdominal: Soft. Bowel sounds are normal. She exhibits no distension.       Ttp in the rlq radiating in to the right flank.  Musculoskeletal: Normal range of motion.  Neurological: She is alert and oriented to person, place, and time.  Skin: Skin is warm and dry. She is not diaphoretic.    ED Course  Procedures (including critical care time)   Labs Reviewed  PREGNANCY, URINE  URINALYSIS, ROUTINE W REFLEX MICROSCOPIC   No results found.   No diagnosis found.    MDM  The Ct looks okay.  Urine shows uti.  Will discharge with antibiotics and pain meds.  To return prn.        Geoffery Lyons, MD 07/15/11 0002

## 2011-07-15 ENCOUNTER — Encounter (HOSPITAL_COMMUNITY): Payer: Self-pay | Admitting: General Practice

## 2011-07-15 ENCOUNTER — Emergency Department (HOSPITAL_COMMUNITY)
Admission: EM | Admit: 2011-07-15 | Discharge: 2011-07-15 | Disposition: A | Discharge status: Home / Self Care | Payer: 59 | Attending: Emergency Medicine | Admitting: Emergency Medicine

## 2011-07-15 DIAGNOSIS — F17200 Nicotine dependence, unspecified, uncomplicated: Secondary | ICD-10-CM | POA: Insufficient documentation

## 2011-07-15 DIAGNOSIS — R454 Irritability and anger: Secondary | ICD-10-CM

## 2011-07-15 DIAGNOSIS — F411 Generalized anxiety disorder: Secondary | ICD-10-CM | POA: Insufficient documentation

## 2011-07-15 DIAGNOSIS — Z79899 Other long term (current) drug therapy: Secondary | ICD-10-CM | POA: Insufficient documentation

## 2011-07-15 DIAGNOSIS — F911 Conduct disorder, childhood-onset type: Secondary | ICD-10-CM | POA: Insufficient documentation

## 2011-07-15 LAB — COMPREHENSIVE METABOLIC PANEL
ALT: 11 U/L (ref 0–35)
AST: 18 U/L (ref 0–37)
Albumin: 4.2 g/dL (ref 3.5–5.2)
Alkaline Phosphatase: 58 U/L (ref 39–117)
Glucose, Bld: 71 mg/dL (ref 70–99)
Potassium: 4.6 mEq/L (ref 3.5–5.1)
Sodium: 138 mEq/L (ref 135–145)
Total Protein: 7.5 g/dL (ref 6.0–8.3)

## 2011-07-15 LAB — RAPID URINE DRUG SCREEN, HOSP PERFORMED
Amphetamines: NOT DETECTED
Barbiturates: NOT DETECTED
Benzodiazepines: POSITIVE — AB
Cocaine: NOT DETECTED
Tetrahydrocannabinol: NOT DETECTED

## 2011-07-15 LAB — CBC
Hemoglobin: 13.1 g/dL (ref 12.0–15.0)
MCHC: 32.8 g/dL (ref 30.0–36.0)
Platelets: 213 10*3/uL (ref 150–400)
RDW: 13.3 % (ref 11.5–15.5)

## 2011-07-15 LAB — POCT PREGNANCY, URINE: Preg Test, Ur: NEGATIVE

## 2011-07-15 MED ORDER — HYDROCODONE-ACETAMINOPHEN 5-500 MG PO TABS: 1.0000 | ORAL_TABLET | Freq: Four times a day (QID) | ORAL | Status: AC | PRN

## 2011-07-15 MED ORDER — SULFAMETHOXAZOLE-TRIMETHOPRIM 800-160 MG PO TABS: 1.0000 | ORAL_TABLET | Freq: Two times a day (BID) | ORAL | Status: AC

## 2011-07-15 MED ORDER — SULFAMETHOXAZOLE-TRIMETHOPRIM 800-160 MG PO TABS: 1.0000 | ORAL_TABLET | Freq: Two times a day (BID) | ORAL | Status: DC

## 2011-07-15 MED ORDER — PROMETHAZINE HCL 25 MG PO TABS: 25.0000 mg | ORAL_TABLET | Freq: Four times a day (QID) | ORAL | Status: DC | PRN

## 2011-07-15 MED ORDER — LORAZEPAM 1 MG PO TABS
1.0000 mg | ORAL_TABLET | Freq: Once | ORAL | Status: AC
  Administered 2011-07-15: 1 mg via ORAL
  Filled 2011-07-15: qty 1

## 2011-07-15 MED ORDER — ARIPIPRAZOLE 10 MG PO TABS: 10.0000 mg | ORAL_TABLET | Freq: Every day | ORAL | Status: DC

## 2011-07-15 MED ORDER — NICOTINE 21 MG/24HR TD PT24
21.0000 mg | MEDICATED_PATCH | Freq: Once | TRANSDERMAL | Status: DC
  Administered 2011-07-15: 21 mg via TRANSDERMAL
  Filled 2011-07-15: qty 1

## 2011-07-15 NOTE — ED Notes (Signed)
Pt. Asleep and breathing normal. 

## 2011-07-15 NOTE — ED Notes (Signed)
Pt changed into paper scrubs. Belongings placed in 2 pt belongings bags. One shirt, one pair of pants, bra, underwear, tennis shoes, pack of cigarettes, pair of socks. Security has searched belongings and wanded pt. Pt belongings behind triage nursing station.

## 2011-07-15 NOTE — Discharge Planning (Signed)
Patient seen by telepsych who recommends discharge. Discussed with ED PA who agrees with disposition. Patient drowsy due to Ativan intake and will call family member for ride home.  Patient's nurse notified.  Ileene Hutchinson , MSW, LCSWA 07/15/2011 2:50 PM [phone]

## 2011-07-15 NOTE — ED Notes (Signed)
Pt. Asleep, breathing WNL 

## 2011-07-15 NOTE — ED Notes (Signed)
Pt stated she felt like she was about to "lost her temper". When asked why sts "just everything. I want to be in a room with a bed." Explained process of medical clearance to pt and that she will be moved after EDP sees her. Verbalizes understanding. Triage chair laid flat so pt could lie down. Lights turned off to provide calmer environment.

## 2011-07-15 NOTE — ED Notes (Signed)
Pt.'s mother called, states that she will be here in 10 min. To pick up her daughter.

## 2011-07-15 NOTE — ED Notes (Signed)
Per pt. Request, given sandwich/drink.

## 2011-07-15 NOTE — ED Notes (Signed)
Report called to Kelby Aline ED RN. Awaiting holding orders to be placed in computer and then will move pt to psych ed.

## 2011-07-15 NOTE — ED Provider Notes (Signed)
Medical screening examination/treatment/procedure(s) were performed by non-physician practitioner and as supervising physician I was immediately available for consultation/collaboration. Devoria Albe, MD, [facility]   Ward Givens, MD 07/15/11 1515

## 2011-07-15 NOTE — ED Notes (Signed)
Oriented pt. To psych ed.  2 bags of belongings placed in psych ed locker 43.

## 2011-07-15 NOTE — BH Assessment (Signed)
Assessment Note   [name] is an 26 y.o. female. Patient presented to the Emergency Department complaining of increasing anger. Patient reports being in an argument with mother and wanting to throw something at her, but it not being done. CSW discussed with patient following up with outpatient psych for behavioral complaints, and patient reported that she feels she is in need of inpatient psych. Patient also reported she needs help for her alcohol dependence, however, was in consistent when discussing last use.  Patient was very drowsy during the assessment. CSW requests telepsych to determine appropriate disposition of the patient.  Axis I: Mood Disorder NOS Axis II: Deferred Axis III:  Past Medical History  Diagnosis Date  . Gastric reflux   . Anxiety   . Basedow's disease   . Pyelonephritis   . History of nephrolithiasis   . ADD (attention deficit hyperactivity disorder, inattentive type)   . Anxiety disorder    Axis IV: other psychosocial or environmental problems and problems with primary support group Axis V: 51-60 moderate symptoms  Past Medical History:  Past Medical History  Diagnosis Date  . Gastric reflux   . Anxiety   . Basedow's disease   . Pyelonephritis   . History of nephrolithiasis   . ADD (attention deficit hyperactivity disorder, inattentive type)   . Anxiety disorder     Past Surgical History  Procedure Date  . Tonsillectomy     Family History:  Family History  Problem Relation Age of Onset  . Diabetes    . Hypertension    . Lung cancer      Social History:  reports that she has been smoking.  She has never used smokeless tobacco. She reports that she drinks alcohol. She reports that she does not use illicit drugs.  Additional Social History:    Allergies:  Allergies  Allergen Reactions  . Darvocet (Propoxyphene N-Acetaminophen)   . Neurontin (Gabapentin)   . Penicillins Hives    Home Medications:  Medications Prior to Admission    Medication Dose Route Frequency Provider Last Rate Last Dose  . 0.9 %  sodium chloride infusion   Intravenous Once Geoffery Lyons, MD 1,000 mL/hr at 07/14/11 2238    . ketorolac (TORADOL) 30 MG/ML injection 30 mg  30 mg Intravenous Once Geoffery Lyons, MD   30 mg at 07/14/11 2240  . LORazepam (ATIVAN) tablet 1 mg  1 mg Oral Once Rodena Medin, PA-C   1 mg at 07/15/11 1034  . morphine 4 MG/ML injection 4 mg  4 mg Intravenous Once Geoffery Lyons, MD   4 mg at 07/14/11 2331  . nicotine (NICODERM CQ - dosed in mg/24 hours) patch 21 mg  21 mg Transdermal Once Rodena Medin, PA-C   21 mg at 07/15/11 1035  . ondansetron (ZOFRAN) injection 4 mg  4 mg Intravenous Once Geoffery Lyons, MD   4 mg at 07/14/11 2239  . promethazine (PHENERGAN) injection 12.5 mg  12.5 mg Intravenous Once Geoffery Lyons, MD   12.5 mg at 07/14/11 2333   Medications Prior to Admission  Medication Sig Dispense Refill  . ARIPiprazole (ABILIFY) 5 MG tablet Take 1 tablet (5 mg total) by mouth daily.  30 tablet  0  . clonazePAM (KLONOPIN) 1 MG tablet Take 1 tablet (1 mg total) by mouth 2 (two) times daily as needed for anxiety.  60 tablet  0  . FLUoxetine (PROZAC) 40 MG capsule Take 1 capsule (40 mg total) by mouth daily.  30  capsule  0  . phenazopyridine (PYRIDIUM) 95 MG tablet Take 95 mg by mouth 3 (three) times daily as needed. Urinary tract relief      . promethazine (PHENERGAN) 25 MG tablet Take 1 tablet (25 mg total) by mouth every 6 (six) hours as needed for nausea.  10 tablet  0  . HYDROcodone-acetaminophen (VICODIN) 5-500 MG per tablet Take 1-2 tablets by mouth every 6 (six) hours as needed for pain.  12 tablet  0  . sulfamethoxazole-trimethoprim (SEPTRA DS) 800-160 MG per tablet Take 1 tablet by mouth every 12 (twelve) hours.  10 tablet  0    OB/GYN Status:  Patient's last menstrual period was 06/16/2011.  General Assessment Data Location of Assessment: [facility] ED ACT Assessment: Yes Living Arrangements: Parent Can pt return to  current living arrangement?: Yes Admission Status: Voluntary Is patient capable of signing voluntary admission?: Yes Transfer from: Home Referral Source: Self/Family/Friend     Risk to self Suicidal Ideation: No Suicidal Intent: No Is patient at risk for suicide?: No Suicidal Plan?: No Access to Means: No Previous Attempts/Gestures: Yes How many times?: 1  Other Self Harm Risks: None Triggers for Past Attempts: Unknown Intentional Self Injurious Behavior: None Family Suicide History: No Recent stressful life event(s): Conflict (Comment) (Arguing with mother) Persecutory voices/beliefs?: No Depression: Yes Depression Symptoms: Feeling angry/irritable Substance abuse history and/or treatment for substance abuse?: No Suicide prevention information given to non-admitted patients: Not applicable  Risk to Others Homicidal Ideation: No Thoughts of Harm to Others: No Current Homicidal Intent: No Current Homicidal Plan: No Access to Homicidal Means: No History of harm to others?: Yes (Reports throwing items at mother when mad) Assessment of Violence: In past 6-12 months Violent Behavior Description: Throwing things at mother when angry Does patient have access to weapons?: No Criminal Charges Pending?: No Does patient have a court date: No  Psychosis Hallucinations: None noted Delusions: None noted  Mental Status Report Appear/Hygiene: Disheveled Eye Contact: Poor Motor Activity: Freedom of movement Speech: Slurred;Logical/coherent Level of Consciousness: Drowsy Mood: Depressed Affect: Anxious;Appropriate to circumstance Anxiety Level: None Thought Processes: Coherent;Relevant Judgement: Unimpaired Orientation: Person;Place;Time;Situation Obsessive Compulsive Thoughts/Behaviors: None  Cognitive Functioning Concentration: Normal Memory: Remote Intact;Recent Intact IQ: Average Insight: Fair Impulse Control: Fair Appetite: Good Sleep: No Change Vegetative  Symptoms: None  Prior Inpatient Therapy Prior Inpatient Therapy: Yes Prior Therapy Dates: 10/12 Prior Therapy Facilty/Provider([facility]): [facility] Reason for Treatment: anxiety/depression   Prior Outpatient Therapy Prior Outpatient Therapy: No Prior Therapy Dates: na Prior Therapy Facilty/Provider(s): na Reason for Treatment: na            Values / Beliefs Cultural Requests During Hospitalization: None Spiritual Requests During Hospitalization: None        Additional Information 1:1 In Past 12 Months?: No CIRT Risk: No Elopement Risk: No Does patient have medical clearance?: Yes     Disposition:  Disposition Disposition of Patient: Referred to  On Site Evaluation by:   Reviewed with Physician:     Ileene Hutchinson 07/15/2011 2:21 PM

## 2011-07-15 NOTE — ED Notes (Signed)
Melvenia Beam, EDPA at bedside.

## 2011-07-15 NOTE — ED Notes (Signed)
Lab at bedside

## 2011-07-15 NOTE — ED Notes (Signed)
Patient has two bags of belongings in locker 43.

## 2011-07-15 NOTE — ED Notes (Signed)
Patient states that when she awoke this morning she felt very angry. Patient lives with her mother and had an argument with her this morning. Patient reports she was throwing things and felt like she wanted to hurt someone so she called the police to have her admitted "for help."

## 2011-07-15 NOTE — ED Provider Notes (Signed)
History     CSN: [id number]  Arrival date & time 07/15/11  0915   First MD Initiated Contact with Patient 07/15/11 1027      Chief Complaint  Patient presents with  . Medical Clearance    (Consider location/radiation/quality/duration/timing/severity/associated sxs/prior treatment) Patient is a 26 y.o. female presenting with mental health disorder. The history is provided by the patient.  Mental Health Problem  The degree of incapacity that she is experiencing as a consequence of her illness is mild. Associated symptoms comments: She woke this morning, after being evaluated yesterday, feeling aggressive and angry. There was an argument with her mother and she admits to throwing things and felt like hitting someone, but didn't. She feels her anger is out of control and requests admission to Behavior Health. She denies SI/HI, hallucinations or substance abuse issues.. She does not contemplate harming herself.    Past Medical History  Diagnosis Date  . Gastric reflux   . Anxiety   . Basedow's disease   . Pyelonephritis   . History of nephrolithiasis   . ADD (attention deficit hyperactivity disorder, inattentive type)   . Anxiety disorder     Past Surgical History  Procedure Date  . Tonsillectomy     Family History  Problem Relation Age of Onset  . Diabetes    . Hypertension    . Lung cancer      History  Substance Use Topics  . Smoking status: Current Everyday Smoker -- 1.0 packs/day  . Smokeless tobacco: Never Used  . Alcohol Use: Yes    OB History    Grav Para Term Preterm Abortions TAB SAB Ect Mult Living                  Review of Systems  Constitutional: Negative for fever and chills.  HENT: Negative.   Respiratory: Negative.   Cardiovascular: Negative.   Gastrointestinal: Negative.   Musculoskeletal: Negative.   Skin: Negative.   Neurological: Negative.     Allergies  Darvocet; Neurontin; and Penicillins  Home Medications   Current Outpatient  Rx  Name Route Sig Dispense Refill  . ARIPIPRAZOLE 5 MG PO TABS Oral Take 1 tablet (5 mg total) by mouth daily. 30 tablet 0  . CLONAZEPAM 1 MG PO TABS Oral Take 1 tablet (1 mg total) by mouth 2 (two) times daily as needed for anxiety. 60 tablet 0  . FLUOXETINE HCL 40 MG PO CAPS Oral Take 1 capsule (40 mg total) by mouth daily. 30 capsule 0  . PHENAZOPYRIDINE HCL 95 MG PO TABS Oral Take 95 mg by mouth 3 (three) times daily as needed. Urinary tract relief    . PROMETHAZINE HCL 25 MG PO TABS Oral Take 1 tablet (25 mg total) by mouth every 6 (six) hours as needed for nausea. 10 tablet 0  . HYDROCODONE-ACETAMINOPHEN 5-500 MG PO TABS Oral Take 1-2 tablets by mouth every 6 (six) hours as needed for pain. 12 tablet 0  . SULFAMETHOXAZOLE-TRIMETHOPRIM 800-160 MG PO TABS Oral Take 1 tablet by mouth every 12 (twelve) hours. 10 tablet 0    BP 105/75  Pulse 82  Temp(Src) 97.7 F (36.5 C) (Oral)  Resp 16  SpO2 100%  LMP 06/16/2011  Physical Exam  Constitutional: She appears well-developed and well-nourished.  HENT:  Head: Normocephalic.  Neck: Normal range of motion. Neck supple.  Cardiovascular: Normal rate and regular rhythm.   Pulmonary/Chest: Effort normal and breath sounds normal.  Abdominal: Soft. Bowel sounds are normal. There is  no tenderness. There is no rebound and no guarding.  Musculoskeletal: Normal range of motion.  Neurological: She is alert. No cranial nerve deficit.  Skin: Skin is warm and dry. No rash noted.  Psychiatric: She has a normal mood and affect.    ED Course  Procedures (including critical care time)  Labs Reviewed  URINE RAPID DRUG SCREEN (HOSP PERFORMED) - Abnormal; Notable for the following:    Opiates POSITIVE (*)    Benzodiazepines POSITIVE (*)    All other components within normal limits  CBC  COMPREHENSIVE METABOLIC PANEL  ETHANOL  POCT PREGNANCY, URINE   Ct Abdomen Pelvis Wo Contrast  07/14/2011  *RADIOLOGY REPORT*  Clinical Data:  Left lower  quadrant abdominal pain, question kidney stone  CT ABDOMEN AND PELVIS WITHOUT CONTRAST  Technique:  Multidetector CT imaging of the abdomen and pelvis was performed following the standard protocol without intravenous contrast.  Sagittal and coronal MPR images reconstructed from axial data set.  Comparison: 02/14/2011  Findings: Minimal dependent atelectasis right lower lobe. No urinary tract calcification, hydronephrosis, or ureteral dilatation. Within limits of a nonenhanced exam, no focal abnormalities of the liver, spleen, pancreas, kidneys, or adrenal glands. Bladder and ureters unremarkable. Normal appearance of uterus and adnexae.  Normal appendix. Stomach and bowel loops grossly unremarkable for technique. No mass, adenopathy, free fluid, or inflammatory process. Scoliosis. No hernia or acute osseous findings.  IMPRESSION: No acute intra-abdominal or intrapelvic abnormalities.  Original Report Authenticated By: Lollie Marrow, M.D.     No diagnosis found.    MDM          Rodena Medin, PA-C 07/15/11 1157

## 2011-07-16 ENCOUNTER — Other Ambulatory Visit (HOSPITAL_COMMUNITY): Payer: Self-pay | Admitting: Psychiatry

## 2011-07-22 ENCOUNTER — Ambulatory Visit (INDEPENDENT_AMBULATORY_CARE_PROVIDER_SITE_OTHER): Payer: 59 | Admitting: Psychology

## 2011-07-22 DIAGNOSIS — F411 Generalized anxiety disorder: Secondary | ICD-10-CM

## 2011-07-22 NOTE — Progress Notes (Signed)
   THERAPIST PROGRESS NOTE  Session Time: 0930 - 1020  Participation Level: Active  Behavioral Response: NeatConfusedAnxious  Hard to attend due to racing thoughts.  Type of Therapy: Family Therapy  Treatment Goals addressed: Coping  Interventions: Supportive and Other: problem solving regarding long-term treatment options  Summary: [name] is a 26 y.o. female who presents with "racing thoughts" and daily decompensation over past several weeks with a number of visits to ED.  She states her goal was to be readmitted to [facility] inpatient unit, but this did not happen.  Instead, her mother has found several faith based programs for young women who have been using substances.    I asked her mother to join [place], and together, we talked about the options and whether [name] would be able to remain on medications while attending the programs.  I also brought her a brochure about [facility] as another organization to check into for long term treatment with a goal of successful recovery.  [name] states that her Abilify was increased to 10 mg. Daily by the recent ED Dr., but that she is seeing no difference.  In addition to racing thoughts, she cites primary insomnia and a relapse to alcohol with subsequest blackouts.  Mother reports she has been having daily anxiety attacks with tears, anger, asking for someone to help her.  She is interested in any option that may help.  Together, we made a plan for her to take [name] to [facility], a crisis center in [place], [place] on Sunday, Feb. 10.  From there, they will work on finding long term placement.  She will keep her appointment here at this office for medication monitoring.  Suicidal/Homicidal: No,without intent/plan  Therapist Response: see above Plan: Return again in  PRN, depending on how things play out over the next few weeks.  Diagnosis: Axis I: Generalized Anxiety Disorder    Axis II: Deferred    Monica Rodda,  RN 07/22/2011

## 2011-07-30 ENCOUNTER — Ambulatory Visit (INDEPENDENT_AMBULATORY_CARE_PROVIDER_SITE_OTHER): Payer: 59 | Admitting: Psychiatry

## 2011-07-30 ENCOUNTER — Encounter (HOSPITAL_COMMUNITY): Payer: Self-pay | Admitting: Psychiatry

## 2011-07-30 VITALS — BP 110/70 | Wt 151.0 lb

## 2011-07-30 DIAGNOSIS — F319 Bipolar disorder, unspecified: Secondary | ICD-10-CM

## 2011-07-30 MED ORDER — ARIPIPRAZOLE 10 MG PO TABS: 10.0000 mg | ORAL_TABLET | Freq: Every day | ORAL | Status: DC

## 2011-07-30 NOTE — Progress Notes (Signed)
Chief complaint I have a temper problem and any help  History of presenting illness Patient is 26 year old Caucasian unemployed single female who came for her appointment. Patient has significant history of mood swings anger, agitation and depression. She has seen by PA and therapist in this office since October 2012. [facility] was [facility] after she tried to jump out from the vehicle due to suicidal thinking. Patient mother talk her down and patient was admitted to psychiatry unit. At that time patient was also intoxicated with alcohol. Patient told she was very upset and having a lot of things going on in her life. Patient told she is started to notice more depressed and having anger issue since her parents get divorced and her grandmother found cancer in her lung. Patient is very close to with her aunt and grandmother. Patient told recently her mother put her in religion-based program which she do not like it. Patient endorse the program is very structure with a strict rules and regulation. She told she is not happy and wants to come home. Patient also told she is not taking Prozac and Klonopin which is not authorized by program. She is taking Abilify which was recently increased to 10 mg when she had a emergency room visit on January 31. Patient told she went to the ER as she having a lot of anger issues. At that time she was drinking and using marijuana. Her alcohol level was 229. She was also positive for opiates however she told she has not used pain medication or drinking alcohol since that. Patient told since she is taking Abilify her behavior is much control and her anger is less intense. She continues to have insomnia poor attention poor concentration and racing thoughts he she admitted her violent episode has been less intense and less frequent. She reported no side effects of medication. She is seeing therapist in this office on a regular basis.  Past psychiatric  history Patient admitted history of depression, mood swings, anger and poor attention and concentration very early in her life. She remembered taking Paxil at fourth grade given by psychiatrist at [facility]. In her life she has taken Xanax Prozac Adderall Lexapro given by her psychiatrist, primary care physician and Dr. Evelene Croon. She admitted multiple ER visits for her temper problem. She also admitted history of overdose on Xanax in the past. She has at least one psychiatric admission which was October 2012. She admitted history of abusing Xanax and pain medication.  Medical history Patient has history of GERD, history of nephrolithiasis and UTI. Her primary care physician is Dr Laury Axon  Alcohol and substance use history Patient admitted history of drinking alcohol, marijuana use, abusing pain medication and at least one time cocaine. Patient admitted binge drinking with intoxication. Patient told her last use of alcohol and marijuana was 4 weeks ago.  Psychosocial history Patient was born and raised in [place]. Her parent divorced 2 years ago. Patient was living with her grandmother, aunt and uncle however recently she is living with church based program. Her mother is trying to rent apartment close to her grandmother house. Patient mother worked in a hospital as an Charity fundraiser.  Education and work history Patient has some college education. She wants to become nervous however recently she dropped out from school. Patient wants to go back into school. She is not working.  Family history Patient has one sister who has been treated for alcohol use at [facility].  Mental  status examination Patient is casually dressed and fairly groomed. She appears anxious and tired. At times she is guarded and minimizes her alcohol intake and substance use. Overall she is pleasant. She denies any auditory or visual hallucination. He denies any active or passive suicidal thoughts or homicidal thoughts. Her  attention and concentration is fair. Her speech is monotonous. She described her mood as depressed and her affect is labile. She is not happy way she is living currently. There were no tremors or shakes present at this time. Her thought process is slow but logical. She's alert and oriented x3. Her insight and judgment is fair. Her impulse control is okay.  Assessment Axis I mood disorder NOS rule out bipolar disorder type I, polysubstance dependence, alcohol abuse Axis II deferred Axis III see medical history Axis IV but moderate Axis V 60-65  Plan I talked to the patient about her symptom and illness. Patient admitted since taking Abilify her mood and anger has been much control. I also talk in length about continued use of pain medication, benzodiazepine and drinking that may cause interaction with her psychotropic medication and worsening of the symptoms. Patient agreed and reported she is trying to remain sober from drinking and illegal substances. I also explained risks and benefits of medication. I encourage her to see counselor regularly for increase coping and social skills. I also thought that she does not need Klonopin at this time. We will consider restarting small dose of Prozac if patient is started to notice more depressed. I will continue Abilify 10 mg daily. I will see her again in 2 weeks. I recommended to call us if she has any question or concern about the medication. Or if she feels worsening of her symptoms or any time if she having suicidal thinking and homicidal thinking then she need to call 911 or go to local ER.     manic like symptoms when she feels burst of energy with decreased sleep, increased talking, flight of ideas, and racing thoughts. Patient also reported she uses drinking and and smoking marijuana to calm her down.

## 2011-08-03 ENCOUNTER — Other Ambulatory Visit (HOSPITAL_COMMUNITY): Payer: Self-pay | Admitting: Psychiatry

## 2011-08-10 ENCOUNTER — Ambulatory Visit (INDEPENDENT_AMBULATORY_CARE_PROVIDER_SITE_OTHER): Payer: 59 | Admitting: Psychology

## 2011-08-10 DIAGNOSIS — F3161 Bipolar disorder, current episode mixed, mild: Secondary | ICD-10-CM

## 2011-08-10 NOTE — Progress Notes (Signed)
   THERAPIST PROGRESS NOTE  Session Time: 1400 - 1445  Participation Level: Active  Behavioral Response: Casual and NeatAlertEuthymic  Type of Therapy: Individual Therapy  Treatment Goals addressed: Coping  Interventions: CBT, Supportive and Reframing  Summary: [name] is a 26 y.o. female who presents with 3 weeks of sobriety after spending 5 days in crisis center.  She is back at home, living with her grandmother and her cats and dog, but finding herself with too much time on her hands.  She is applying for work and has reinstated her financial aid for school.  Her repeated complaint is that "my mind is going all the time and it drives me crazy".  She reports that the Abilify she is taking is helping a lot with her tendency to get angry, and probably helping her get sleep, but has not slowed down her thinking at all.  She does find she can enjoy watching hockey (she used to love playing both ice and roller hockey), relaxing in a bath, or listening to loud music, but other wise, she finds herself distractible, unfocused and irritable.  Despite these symptoms, she said she feels good, is not depressed and has no suicidal or homicidal thoughts.  She does complain about the continued tremors in her hands, even now that she is off Prozac that she was thinking was the cause.  We talked about her stressors that started in 2008 with the divorce of her parents, death of her grandfather, and diagnosis of her grandmother with lung cancer (this is the same grandmother she lives with).  About the same time, she and her younger sister had conflict over boyfriends and they have never regained a good relationship since that time.  She is worried about being successful in the future, afraid that she will have trouble working and going to school.  But for now, she is focused on working to pay her own bills with plans to start school again in the Fall.  Suicidal/Homicidal: Nowithout  intent/plan  Therapist Response: Gave positive feedback for sobriety and the goals she has set for herself.  Offered other possibilities for helping with the mind racing: headphones for music and guided relaxation CDs, returning to some form of physical activity, and seeking work in Vet clinics since she once aspired to be a International aid/development worker.  Plan: Return again in 3 weeks.  Will work on Printmaker, self-confidence, making healthy choices with CBT and solution focused approaches.  Diagnosis: Axis I: Bipolar,  Vs. ADHD    Axis II: Deferred    Ryoma Nofziger, RN 08/10/2011

## 2011-08-18 ENCOUNTER — Encounter (HOSPITAL_COMMUNITY): Payer: Self-pay | Admitting: Psychiatry

## 2011-08-18 ENCOUNTER — Ambulatory Visit (INDEPENDENT_AMBULATORY_CARE_PROVIDER_SITE_OTHER): Payer: 59 | Admitting: Psychiatry

## 2011-08-18 VITALS — BP 123/77 | HR 109 | Wt 151.2 lb

## 2011-08-18 DIAGNOSIS — F319 Bipolar disorder, unspecified: Secondary | ICD-10-CM

## 2011-08-18 MED ORDER — ARIPIPRAZOLE 10 MG PO TABS: 10.0000 mg | ORAL_TABLET | Freq: Every day | ORAL | Status: DC

## 2011-08-18 MED ORDER — BENZTROPINE MESYLATE 1 MG PO TABS: 1.0000 mg | ORAL_TABLET | Freq: Every day | ORAL | Status: DC

## 2011-08-18 NOTE — Progress Notes (Signed)
Chief complaint I am doing better   History of presenting illness Patient is 26 year old Caucasian unemployed single female who came for her followup appointment. Patient now taking Abilify 10 mg daily. She reported compliant with the medication and feeling much better to control her anger and mood swings. She is sleeping 6-7 hours without any problem. She hadn't stop using Martorana however she continued to endorse drinking on occasion. Patient had cut down her drinking a lot from the past. She is trying very hard to stop drinking. She admitted Abilify has make a huge improvement in her life. She complained some shakes and tremors however she reported these symptoms even before starting the Abilify. She's excited as she back home. Her relationship with her mother is much improved. She denies any agitation anger or mood swings in past 2 weeks. She denies any crying spells or feeling of hopelessness or helplessness. Sometimes she feel that her mind races a lot but also helping it may get better over time.  She is seeing therapist in this office on a regular basis.  Current psychiatric medication Abilify 10 mg daily  Past psychiatric history Patient admitted history of depression, mood swings, anger and poor attention and concentration very early in her life. She remembered taking Paxil at fourth grade given by psychiatrist at [facility]. In her life she has taken Xanax Prozac Adderall Lexapro given by her psychiatrist, primary care physician and Dr. Evelene Croon. She admitted multiple ER visits for her temper problem. She also admitted history of overdose on Xanax in the past. She has at least one psychiatric admission which was October 2012. She admitted history of abusing Xanax and pain medication.  Medical history Patient has history of GERD, history of nephrolithiasis and UTI. Her primary care physician is Dr Laury Axon She is taking Levaquin for her kidney infection.  Alcohol and substance use  history Patient admitted history of drinking alcohol, marijuana use, abusing pain medication and at least one time cocaine. Patient admitted binge drinking with intoxication.   Psychosocial history Patient was born and raised in [place]. Her parent divorced 2 years ago. Patient was living with her grandmother, aunt and uncle however recently she is living with church based program. Her mother is trying to rent apartment close to her grandmother house. Patient mother worked in a hospital as an Charity fundraiser.  Education and work history Patient has some college education. She wants to become nervous however recently she dropped out from school. Patient wants to go back into school. She is not working.  Family history Patient has one sister who has been treated for alcohol use at [facility].  Mental status examination Patient is casually dressed and fairly groomed. She appears anxious but pleasant. She is more cooperative and maintained good eye contact. Her speech is slow but clear and coherent. Her thought process is also slow but logical. She denies any active or passive suicidal thinking and homicidal thinking. She denies any auditory or visual hallucination. Her attention and concentration is is still impaired. She has some tremors. Her speech is monotonous. She described her mood fine and her affect is mood congruent. She's alert and oriented x3. Her insight judgment and impulse control is fair.   Assessment Axis I mood disorder NOS rule out bipolar disorder type I, polysubstance dependence, alcohol abuse Axis II deferred Axis III see medical history Axis IV but moderate Axis V 60-65  Plan I reviewed her medication, last progress note and history. Patient has significant history of alcohol  and behavior problem. At this time I will like to continue Abilify 10 mg which is helping her. I will add Cogentin 1 mg at bedtime to target those tremors if it is causing by Abilify. She will  continue to see therapist regularly. I also talk in length about continued drinking that may cause interaction with her psychotropic medication and worsening of the symptoms. Patient agreed and reported she is trying to remain sober from drinking and illegal substances. I also explained risks and benefits of medication.  Safety plan discuss that if she feels worsening of her symptoms or any time if she having suicidal thinking and homicidal thinking then she need to call 911 or go to local ER. I will see her again in 4 weeks. Time spent 30 minutes

## 2011-09-03 ENCOUNTER — Emergency Department (HOSPITAL_COMMUNITY)
Admission: EM | Admit: 2011-09-03 | Discharge: 2011-09-03 | Disposition: A | Discharge status: Home / Self Care | Payer: 59 | Attending: Emergency Medicine | Admitting: Emergency Medicine

## 2011-09-03 ENCOUNTER — Emergency Department (HOSPITAL_COMMUNITY): Payer: 59

## 2011-09-03 ENCOUNTER — Encounter (HOSPITAL_COMMUNITY): Payer: Self-pay | Admitting: Emergency Medicine

## 2011-09-03 ENCOUNTER — Other Ambulatory Visit: Payer: Self-pay

## 2011-09-03 DIAGNOSIS — R079 Chest pain, unspecified: Secondary | ICD-10-CM | POA: Insufficient documentation

## 2011-09-03 DIAGNOSIS — F17200 Nicotine dependence, unspecified, uncomplicated: Secondary | ICD-10-CM | POA: Insufficient documentation

## 2011-09-03 DIAGNOSIS — K219 Gastro-esophageal reflux disease without esophagitis: Secondary | ICD-10-CM | POA: Insufficient documentation

## 2011-09-03 DIAGNOSIS — R0602 Shortness of breath: Secondary | ICD-10-CM | POA: Insufficient documentation

## 2011-09-03 DIAGNOSIS — F411 Generalized anxiety disorder: Secondary | ICD-10-CM | POA: Insufficient documentation

## 2011-09-03 DIAGNOSIS — F988 Other specified behavioral and emotional disorders with onset usually occurring in childhood and adolescence: Secondary | ICD-10-CM | POA: Insufficient documentation

## 2011-09-03 DIAGNOSIS — M940 Chondrocostal junction syndrome [Tietze]: Secondary | ICD-10-CM | POA: Insufficient documentation

## 2011-09-03 LAB — POCT I-STAT TROPONIN I: Troponin i, poc: 0 ng/mL (ref 0.00–0.08)

## 2011-09-03 LAB — BASIC METABOLIC PANEL
Calcium: 9.4 mg/dL (ref 8.4–10.5)
GFR calc Af Amer: >90 mL/min (ref >90)
GFR calc non Af Amer: >90 mL/min (ref >90)
Potassium: 3.9 mEq/L (ref 3.5–5.1)
Sodium: 137 mEq/L (ref 135–145)

## 2011-09-03 LAB — CBC
MCH: 27.3 pg (ref 26.0–34.0)
MCHC: 32 g/dL (ref 30.0–36.0)
Platelets: 271 10*3/uL (ref 150–400)

## 2011-09-03 LAB — POCT PREGNANCY, URINE: Preg Test, Ur: NEGATIVE

## 2011-09-03 MED ORDER — NAPROXEN 500 MG PO TABS
500.0000 mg | ORAL_TABLET | Freq: Once | ORAL | Status: AC
  Administered 2011-09-03: 500 mg via ORAL
  Filled 2011-09-03: qty 1

## 2011-09-03 MED ORDER — ASPIRIN 325 MG PO TABS
325.0000 mg | ORAL_TABLET | ORAL | Status: AC
  Administered 2011-09-03: 325 mg via ORAL
  Filled 2011-09-03: qty 1

## 2011-09-03 MED ORDER — NAPROXEN 500 MG PO TABS: 500.0000 mg | ORAL_TABLET | Freq: Two times a day (BID) | ORAL | Status: DC

## 2011-09-03 NOTE — ED Provider Notes (Signed)
History     CSN: [id number]  Arrival date & time 09/03/11  1552   First MD Initiated Contact with Patient 09/03/11 1813      Chief Complaint  Patient presents with  . Chest Pain    (Consider location/radiation/quality/duration/timing/severity/associated sxs/prior treatment) HPI Comments: Patient presents with sharp, intermittent, midsternal chest pain that started yesterday. Patient states that the pain is worse with palpation and deep breathing. She denies shortness of breath, radiation of pain, fever, nausea or vomiting. Patient states she was lifting boxes yesterday however she does not note injury during this. Patient states she has taken ibuprofen with some relief. She admits that she is worried about this pain and wanted it "checked out". No history of heart problems or lung problems. Patient denies risk factors for pulmonary embolism including history of blood clot, history of clotting disorder, use of estrogens, recent immobilization, recent surgery, swelling in lower extremities.  Patient is a 26 y.o. female presenting with chest pain. The history is provided by the patient.  Chest Pain The chest pain began yesterday. Chest pain occurs constantly. The chest pain is unchanged. The quality of the pain is described as aching. The pain does not radiate. Chest pain is worsened by certain positions and deep breathing. Pertinent negatives for primary symptoms include no fever, no syncope, no shortness of breath, no cough, no palpitations, no abdominal pain, no nausea and no vomiting.  Pertinent negatives for associated symptoms include no diaphoresis.     Past Medical History  Diagnosis Date  . Gastric reflux   . Anxiety   . Basedow's disease   . Pyelonephritis   . History of nephrolithiasis   . ADD (attention deficit hyperactivity disorder, inattentive type)   . Anxiety disorder     Past Surgical History  Procedure Date  . Tonsillectomy     Family History  Problem Relation  Age of Onset  . Diabetes    . Hypertension    . Lung cancer    . Bipolar disorder Sister     History  Substance Use Topics  . Smoking status: Current Everyday Smoker -- 1.0 packs/day for 8 years    Types: Cigarettes  . Smokeless tobacco: Never Used  . Alcohol Use: Yes    OB History    Grav Para Term Preterm Abortions TAB SAB Ect Mult Living                  Review of Systems  Constitutional: Negative for fever and diaphoresis.  HENT: Negative for neck pain.   Eyes: Negative for redness.  Respiratory: Negative for cough and shortness of breath.   Cardiovascular: Positive for chest pain. Negative for palpitations, leg swelling and syncope.  Gastrointestinal: Negative for nausea, vomiting and abdominal pain.  Genitourinary: Negative for dysuria.  Musculoskeletal: Negative for back pain.  Skin: Negative for rash.  Neurological: Negative for syncope and light-headedness.    Allergies  Darvocet and Neurontin  Home Medications   Current Outpatient Rx  Name Route Sig Dispense Refill  . ARIPIPRAZOLE 10 MG PO TABS Oral Take 1 tablet (10 mg total) by mouth daily. 30 tablet 0  . BENZTROPINE MESYLATE 1 MG PO TABS Oral Take 1 tablet (1 mg total) by mouth daily. 30 tablet 0  . IBUPROFEN 800 MG PO TABS Oral Take 800 mg by mouth every 8 (eight) hours as needed. Pain.      BP 114/79  Pulse 93  Temp(Src) 98.7 F (37.1 C) (Oral)  Resp 14  SpO2 100%  LMP 08/19/2011  Physical Exam  Nursing note and vitals reviewed. Constitutional: She is oriented to person, place, and time. She appears well-developed and well-nourished.  HENT:  Head: Normocephalic and atraumatic.  Mouth/Throat: Mucous membranes are normal. Mucous membranes are not dry.  Eyes: Conjunctivae are normal.  Neck: Trachea normal and normal range of motion. Neck supple. Normal carotid pulses and no JVD present. No muscular tenderness present. Carotid bruit is not present. No tracheal deviation present.    Cardiovascular: Normal rate, regular rhythm, S1 normal, S2 normal, normal heart sounds and intact distal pulses.  Exam reveals no decreased pulses.   No murmur heard.      Rate 95  Pulmonary/Chest: Effort normal and breath sounds normal. No respiratory distress. She has no wheezes. She exhibits tenderness.       Tenderness to palpation over mid sternum.  Abdominal: Soft. Normal aorta and bowel sounds are normal. There is no tenderness. There is no rebound and no guarding.  Musculoskeletal: Normal range of motion.  Neurological: She is alert and oriented to person, place, and time.  Skin: Skin is warm and dry. She is not diaphoretic. No cyanosis. No pallor.  Psychiatric: She has a normal mood and affect.    ED Course  Procedures (including critical care time)   Labs Reviewed  CBC  BASIC METABOLIC PANEL  POCT I-STAT TROPONIN I  POCT PREGNANCY, URINE  D-DIMER, QUANTITATIVE   Dg Chest 2 View  09/03/2011  *RADIOLOGY REPORT*  Clinical Data: Sharp chest pain.  Shortness of breath.  CHEST - 2 VIEW  Comparison:  09/17/2010  Findings:  The heart size and mediastinal contours are within normal limits.  Both lungs are clear.  The visualized skeletal structures are unremarkable.  IMPRESSION: No active cardiopulmonary disease.  Original Report Authenticated By: Danae Orleans, M.D.     1. Costochondritis     6:35 PM Patient seen and examined. Work-up reviewed with patient. D-dimer and CXR ordered. Medications ordered.   Vital signs reviewed and are as follows: Filed Vitals:   09/03/11 1742  BP: 114/79  Pulse: 93  Temp:   Resp:    7:36 PM Work-up complete. No concerning findings. Will treat for chest wall pain/costochonditritis. Pt informed. She verbalizes understanding and agrees with plan.   MDM  Patient with chest pain. Work-up neg. D-dimer neg. CXR nml. No signs of pericarditis on EKG. Suspect musculoskeletal pain.        [place], [place] 09/03/11 [phone]

## 2011-09-03 NOTE — ED Notes (Signed)
Per PT: sharp pain in center of chest that is worse with deep breathing started yesterday with no known precipitating factors; DENIES: sweats, shortness of breath, nausea, radiating pain, dizziness. Appears calm and comfortable, respiration regular. 7/10 pain currently.

## 2011-09-03 NOTE — Discharge Instructions (Signed)
Please read and follow all provided instructions.  Your diagnoses today include:  1. Costochondritis     Tests performed today include:  An EKG of your heart  A chest x-ray which was normal  Cardiac enzymes - a blood test for heart muscle damage  Blood counts and electrolytes  Vital signs. See below for your results today.   Medications prescribed:   Naproxen - anti-inflammatory pain medication  Take any prescribed medications only as directed.  Follow-up instructions: Please follow-up with your primary care provider as soon as you can for further evaluation of your symptoms. If you do not have a primary care doctor -- see below for referral information.   Return instructions:  SEEK IMMEDIATE MEDICAL ATTENTION IF:  You have severe chest pain, especially if the pain is crushing or pressure-like and spreads to the arms, back, neck, or jaw, or if you have sweating, nausea (feeling sick to your stomach), or shortness of breath. THIS IS AN EMERGENCY. Don't wait to see if the pain will go away. Get medical help at once. Call 911 or 0 (operator). DO NOT drive yourself to the hospital.   Your chest pain gets worse and does not go away with rest.   You have an attack of chest pain lasting longer than usual, despite rest and treatment with the medications your caregiver has prescribed.   You wake from sleep with chest pain or shortness of breath.  You feel dizzy or faint.  You have chest pain not typical of your usual pain for which you originally saw your caregiver.   You have any other emergent concerns regarding your health.  Additional Information: Chest pain comes from many different causes. Your caregiver has diagnosed you as having chest pain that is not specific for one problem, but does not require admission.  You are at low risk for an acute heart condition or other serious illness.   Your vital signs today were: BP 109/72  Pulse 85  Temp(Src) 98.7 F (37.1 C)  (Oral)  Resp 18  SpO2 98%  LMP 08/19/2011 If your blood pressure (BP) was elevated above 135/85 this visit, please have this repeated by your doctor within one month. -------------- No Primary Care Doctor Call [facility]  [phone] Other agencies that provide inexpensive medical care    [facility]  [phone]    [facility]  [phone]    [facility]  [phone]    [facility]  [phone]    [facility]  [phone]    [facility]  [phone] -------------- RESOURCE GUIDE:  Dental Problems  Patients with Medicaid: [facility] Dental [phone]                                            [phone] [address] Phone:  [phone]                                                   Phone:  [phone]  If unable to pay or uninsured, contact:  [facility] or [facility].  to become qualified for the adult dental clinic.  Chronic Pain Problems Contact [facility]  [phone] Patients need to be referred by their primary care doctor.  Insufficient Money for Medicine Contact [employer]:  call "211" or [facility] [phone].  [facility]  [phone] [facility]  [phone] [facility]   [phone] (emergency services [phone])  Substance Abuse Resources [facility]  [phone] [facility] [phone] [facility] [phone] Floydene Flock [phone] Residential & Outpatient Substance Abuse Program  [phone]  Abuse/Neglect [facility] Child Abuse Hotline [phone] [facility] Child Abuse Hotline [phone] (After Hours)  Emergency Shelter [facility] [phone]  Maternity Homes [facility] at the [place] [facility] [phone] [facility] [phone]  [facility]  Resources  [facility]     [employer]                          [facility] Dept. [address] [place]                       [address]      [address]  Blondell Reveal Phone:  [phone]                                   Phone:  [phone]                 Phone:  [phone]  [facility] Mental Health Phone:  [phone]  [facility] Child Abuse Hotline (743) [phone] (After Hours)

## 2011-09-04 NOTE — ED Provider Notes (Signed)
Medical screening examination/treatment/procedure(s) were performed by non-physician practitioner and as supervising physician I was immediately available for consultation/collaboration.  Hurman Horn, MD 09/04/11 1240

## 2011-09-07 ENCOUNTER — Other Ambulatory Visit (HOSPITAL_COMMUNITY): Payer: Self-pay | Admitting: Psychiatry

## 2011-09-13 ENCOUNTER — Emergency Department
Admission: EM | Admit: 2011-09-13 | Discharge: 2011-09-13 | Disposition: A | Discharge status: Home / Self Care | Payer: 59 | Source: Home / Self Care

## 2011-09-13 ENCOUNTER — Other Ambulatory Visit: Payer: Self-pay | Admitting: Emergency Medicine

## 2011-09-13 ENCOUNTER — Encounter: Payer: Self-pay | Admitting: *Deleted

## 2011-09-13 ENCOUNTER — Other Ambulatory Visit (HOSPITAL_COMMUNITY): Payer: Self-pay | Admitting: Psychiatry

## 2011-09-13 DIAGNOSIS — F319 Bipolar disorder, unspecified: Secondary | ICD-10-CM

## 2011-09-13 DIAGNOSIS — N739 Female pelvic inflammatory disease, unspecified: Secondary | ICD-10-CM

## 2011-09-13 MED ORDER — DOXYCYCLINE HYCLATE 100 MG PO CAPS: 100.0000 mg | ORAL_CAPSULE | Freq: Two times a day (BID) | ORAL | Status: AC

## 2011-09-13 NOTE — ED Provider Notes (Signed)
Agree with exam, assessment, and plan.   Lattie Haw, MD 09/13/11 2018

## 2011-09-13 NOTE — ED Notes (Signed)
Pt complains of a boil in her groin area x 6 days. Pt states it is hard tender and red with some blood coming out of it and getting bigger.

## 2011-09-13 NOTE — Discharge Instructions (Signed)
You have had an abscess drained.  An abscess is a collection of pus caused by infection with skin bacteria such as Streptococcus or Staphylococcus.    For the first 2 days, leave the dressing in place and keep it clean and dry.  If the abscess was packed, we may instruct you to come back in 2 days to have the packing removed and maybe replaced.  If the abscess was not packed, you may remove the dressing yourself in 2 days and take care of the wound.  You may bathe or shower but no immersion in water until area healed.  Wear gloves, dispose of the soiled dressing material, and wash your hands before and after changing the dressing.  Wash the area well with soap and water, taking care to remove all the dried blood and drainage.  Fasten a dressing in place well with tape.  Continue to change the dressing until there is no further drainage.  Finish up the prescription of any antibiotics that you have been given.  Take infectious precautions since the bacteria that cause these abscesses may be contagious.  Wash hands frequently or use hand sanitizer, especially after touching the abscess area or changing dressings.  Do not allow anyone else to use your towel or washcloth and wash these items after each use until the abscess has healed.  You may want to use an antibacterial soap such as Dial or a prescription like Hibiclens.  You also may want to consider spraying the tub or shower with a disinfectant such a Lysol until the abscess has healed.  Things that should prompt you to return to the office for a recheck include:  Fever over 100 degrees, increasing pain or drainage, failure of the abscess to heal after 10 days, or other skin lesions elsewhere.

## 2011-09-13 NOTE — ED Provider Notes (Signed)
History     CSN: [id number]  Arrival date & time 09/13/11  1107   None     Chief Complaint  Patient presents with  . Recurrent Skin Infections    (Consider location/radiation/quality/duration/timing/severity/associated sxs/prior treatment) HPI This patient presents for evaluation of a cutaneous abscess.  The lesion is located on the right labia.  Onset was 5 days ago.  Symptoms have somewhat improved since patient mom opened abscess yesterday with needle at home.  Increased drainage. Abscess has associated symptoms of tenderness at site and drainage.  The patient does not have previous history of cutaneous abscesses.  There is not a previous history of MRSA.   They does not have a history of diabetes.   Past Medical History  Diagnosis Date  . Gastric reflux   . Anxiety   . Basedow's disease   . Pyelonephritis   . History of nephrolithiasis   . ADD (attention deficit hyperactivity disorder, inattentive type)   . Anxiety disorder     Past Surgical History  Procedure Date  . Tonsillectomy   . Adenoidectomy   . Tear duct probing     Family History  Problem Relation Age of Onset  . Diabetes    . Hypertension    . Lung cancer    . Bipolar disorder Sister     History  Substance Use Topics  . Smoking status: Current Everyday Smoker -- 1.0 packs/day for 8 years    Types: Cigarettes  . Smokeless tobacco: Never Used  . Alcohol Use: No    OB History    Grav Para Term Preterm Abortions TAB SAB Ect Mult Living                  Review of Systems  All other systems reviewed and are negative.    Allergies  Darvocet and Neurontin  Home Medications   Current Outpatient Rx  Name Route Sig Dispense Refill  . DIPHENHYDRAMINE HCL 12.5 MG/5ML PO ELIX Oral Take 25 mg by mouth 4 (four) times daily as needed.    . ARIPIPRAZOLE 10 MG PO TABS Oral Take 1 tablet (10 mg total) by mouth daily. 30 tablet 0  . BENZTROPINE MESYLATE 1 MG PO TABS Oral Take 1 tablet (1 mg  total) by mouth daily. 30 tablet 0  . DOXYCYCLINE HYCLATE 100 MG PO CAPS Oral Take 1 capsule (100 mg total) by mouth 2 (two) times daily. 20 capsule 0  . IBUPROFEN 800 MG PO TABS Oral Take 800 mg by mouth every 8 (eight) hours as needed. Pain.    Marland Kitchen NAPROXEN 500 MG PO TABS Oral Take 1 tablet (500 mg total) by mouth 2 (two) times daily. 20 tablet 0    BP 110/79  Pulse 99  Temp(Src) 98.6 F (37 C) (Oral)  Resp 16  Ht 5\' 5"  (1.651 m)  Wt 150 lb 8 oz (68.266 kg)  BMI 25.04 kg/m2  SpO2 99%  LMP 08/19/2011  Physical Exam  Constitutional: She is oriented to person, place, and time. Vital signs are normal. She appears well-developed and well-nourished. She is active and cooperative.  HENT:  Head: Normocephalic.  Eyes: Conjunctivae are normal. Pupils are equal, round, and reactive to light. No scleral icterus.  Neck: Trachea normal. Neck supple.  Cardiovascular: Normal rate and regular rhythm.   Pulmonary/Chest: Effort normal and breath sounds normal.  Lymphadenopathy:       Right: Inguinal adenopathy present.  Neurological: She is alert and oriented to person, place, and time.  No cranial nerve deficit or sensory deficit.  Skin: Skin is warm and dry. Lesion noted. There is erythema.     Psychiatric: She has a normal mood and affect. Her speech is normal and behavior is normal. Judgment and thought content normal. Cognition and memory are normal.    ED Course  Procedures    Labs Reviewed  WOUND CULTURE   No results found.   1. Abscess of female genitalia       MDM  You have had an abscess drained by expression only.  An abscess is a collection of pus caused by infection with skin bacteria such as Streptococcus or Staphylococcus.    For the first 2 days, leave the dressing in place and keep it clean and dry.  If the abscess was packed, we may instruct you to come back in 2 days to have the packing removed and maybe replaced.  If the abscess was not packed, you may remove the  dressing yourself in 2 days and take care of the wound.  You may bathe or shower but no immersion in water until area healed.  Wear gloves, dispose of the soiled dressing material, and wash your hands before and after changing the dressing.  Wash the area well with soap and water, taking care to remove all the dried blood and drainage.  Fasten a dressing in place well with tape.  Continue to change the dressing until there is no further drainage.  Finish up the prescription of any antibiotics that you have been given.  Wound culture has been sent for identification of bacteria.  Take infectious precautions since the bacteria that cause these abscesses may be contagious.  Wash hands frequently or use hand sanitizer, especially after touching the abscess area or changing dressings.  Do not allow anyone else to use your towel or washcloth and wash these items after each use until the abscess has healed.  You may want to use an antibacterial soap such as Dial or a prescription like Hibiclens.  You also may want to consider spraying the tub or shower with a disinfectant such a Lysol until the abscess has healed.  Things that should prompt you to return to the office for a recheck include:  Fever over 100 degrees, increasing pain or drainage, failure of the abscess to heal after 10 days, or other skin lesions elsewhere.          Johnsie Kindred, NP 09/13/11 1153

## 2011-09-15 ENCOUNTER — Encounter (HOSPITAL_COMMUNITY): Payer: Self-pay | Admitting: Psychiatry

## 2011-09-15 ENCOUNTER — Ambulatory Visit (INDEPENDENT_AMBULATORY_CARE_PROVIDER_SITE_OTHER): Payer: 59 | Admitting: Psychiatry

## 2011-09-15 DIAGNOSIS — F319 Bipolar disorder, unspecified: Secondary | ICD-10-CM

## 2011-09-15 MED ORDER — HYDROXYZINE PAMOATE 25 MG PO CAPS: 25.0000 mg | ORAL_CAPSULE | Freq: Two times a day (BID) | ORAL | Status: DC | PRN

## 2011-09-15 NOTE — Progress Notes (Signed)
Chief complaint Medication management and followup.   History of presenting illness Patient is 26 year old Caucasian unemployed single female who came for her followup appointment.  Patient recently visited urgent care for abscess .  She complaint abdominal pain and given antibiotic .  She is compliant with her medication and reported no side effects.  She likes Abilify .  She denies any agitation anger or mood swings.  She's not drinking or using any illegal substances.  Patient admitted that recently she is more stressed and anxious about her future .  2 weeks ago she had a panic attack and she visited emergency room for severe anxiety .  Patient admitted poor sleep racing thoughts and very anxious about her future .  She wants to do school and the job however she cannot find job in [place] .  She endorse that she has some job prospects and [place] where her father lives however patient does not want to move from [place] .  She wants to stay with her grandmother and close to her mother .  She's wondering if she can given some medication for her anxiety and sleep .  She also admitted not seeing counselor as forget last appointment .  Overall she reported her mood has been much improved she denies any anger paranoia or any active or passive suicidal thinking .  She denies any hopelessness or helplessness .  She denies any crying spells or violent episodes .  Her shakes and tremors are much improved with Cogentin .    Current psychiatric medication Abilify 10 mg daily Cogentin 1 mg at bedtime.  Past psychiatric history Patient admitted history of depression, mood swings, anger and poor attention and concentration very early in her life. She remembered taking Paxil at fourth grade given by psychiatrist at [facility]. In her life she has taken Xanax Prozac Adderall Lexapro given by her psychiatrist, primary care physician and Dr. Evelene Croon. She admitted multiple ER visits for her temper problem. She also  admitted history of overdose on Xanax in the past. She has at least one psychiatric admission which was October 2012. She admitted history of abusing Xanax and pain medication.  Medical history Patient has history of GERD, history of nephrolithiasis and UTI. Her primary care physician is Dr Laury Axon She is taking Levaquin for her kidney infection.  Recently she was seen at urgent care for genital Abscess she is taking antibiotic.  Alcohol and substance use history Patient admitted history of drinking alcohol, marijuana use, abusing pain medication and at least one time cocaine. Patient admitted binge drinking with intoxication.  She claimed to be sober since her last visit.  Psychosocial history Patient was born and raised in [place]. Her parent divorced 2 years ago. Patient was living with her grandmother, aunt and uncle however recently she is living with church based program. Her mother is trying to rent apartment close to her grandmother house. Patient mother worked in a hospital as an Charity fundraiser.  Education and work history Patient has some college education. She wants to become nervous however recently she dropped out from school. Patient wants to go back into school. She is not working.  Family history Patient has one sister who has been treated for alcohol use at [facility].  Mental status examination Patient is casually dressed and fairly groomed. She appears anxious but pleasant. She is cooperative and maintained good eye contact. Her speech is slow but clear and coherent. Her thought process is also slow but logical. She  denies any active or passive suicidal thinking and homicidal thinking. She denies any auditory or visual hallucination. Her attention and concentration is is still impaired. She has some tremors. Her speech is monotonous. She described her mood fine and her affect is mood congruent. She's alert and oriented x3. Her insight judgment and impulse control is fair.    Assessment Axis I mood disorder NOS rule out bipolar disorder type I, polysubstance dependence, alcohol abuse Axis II deferred Axis III see medical history Axis IV but moderate Axis V 60-65  Plan I will continue Abilify and Cogentin .  I will add Vistaril 25 mg as needed for anxiety and insomnia .  She is taking Benadryl however I recommend to stop and tried Vistaril .  I explained risks and benefits of medication in detail.  I also encouraged her to see therapist for increase coping and social skills.  I recommended to call us if she has any question or concern about the medication or if she feels worsening of the symptoms.  I also reinforced safety plan with her .  I will see her again in 4 weeks .

## 2011-09-16 LAB — WOUND CULTURE

## 2011-09-17 ENCOUNTER — Ambulatory Visit (HOSPITAL_COMMUNITY): Payer: Self-pay | Admitting: Psychology

## 2011-10-07 ENCOUNTER — Ambulatory Visit (HOSPITAL_COMMUNITY): Payer: Self-pay | Admitting: Psychology

## 2011-10-07 ENCOUNTER — Encounter (HOSPITAL_COMMUNITY): Payer: Self-pay

## 2011-10-12 ENCOUNTER — Other Ambulatory Visit (HOSPITAL_COMMUNITY): Payer: Self-pay | Admitting: Psychiatry

## 2011-10-12 DIAGNOSIS — F319 Bipolar disorder, unspecified: Secondary | ICD-10-CM

## 2011-10-15 ENCOUNTER — Ambulatory Visit (INDEPENDENT_AMBULATORY_CARE_PROVIDER_SITE_OTHER): Payer: 59 | Admitting: Psychiatry

## 2011-10-15 ENCOUNTER — Encounter (HOSPITAL_COMMUNITY): Payer: Self-pay | Admitting: Psychiatry

## 2011-10-15 DIAGNOSIS — F319 Bipolar disorder, unspecified: Secondary | ICD-10-CM | POA: Insufficient documentation

## 2011-10-15 NOTE — Progress Notes (Signed)
Chief complaint Medication management and followup.   History of presenting illness Patient is 26 year old Caucasian unemployed single female who came for her followup appointment.  Patient is doing much better on her current medication.  She recently got a job at [employer] and like her job.  She's working 20 hours a week.  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  She described her mood has been stable most of the time however there are times when she required Vistaril for some anxiety and agitation.  She sleeps better .  She denies any side effects of medication .  She denies any tremors or shakes.  She likes taking Cogentin .  Her abscesses completely healed .  She's not taking any more antibiotic.  She had missed appointment with a therapist since she recently start working.  She's not drinking or using any illegal substance.  She denies any anger or crying spells.  Current psychiatric medication Abilify 10 mg daily Cogentin 1 mg at bedtime. Vistaril 25 mg as needed  Past psychiatric history Patient admitted history of depression, mood swings, anger and poor attention and concentration very early in her life. She remembered taking Paxil at fourth grade given by psychiatrist at [facility]. In her life she has taken Xanax Prozac Adderall Lexapro given by her psychiatrist, primary care physician and Dr. Evelene Croon. She admitted multiple ER visits for her temper problem. She also admitted history of overdose on Xanax in the past. She has at least one psychiatric admission which was October 2012. She admitted history of abusing Xanax and pain medication.  Medical history Patient has history of GERD, history of nephrolithiasis and UTI. Her primary care physician is Dr Laury Axon She is taking Levaquin for her kidney infection.  Recently she was seen at urgent care for genital Abscess she is taking antibiotic.  Alcohol and substance use history Patient  admitted history of drinking alcohol, marijuana use, abusing pain medication and at least one time cocaine. Patient admitted binge drinking with intoxication.  She claimed to be sober since her last visit.  Psychosocial history Patient was born and raised in [place]. Her parent divorced 2 years ago. Patient was living with her grandmother, aunt and uncle however recently she is living with church based program. Her mother is trying to rent apartment close to her grandmother house. Patient mother worked in a hospital as an Charity fundraiser.  Education and work history Patient has some college education. She wants to become nervous however recently she dropped out from school. Patient wants to go back into school. She is not working.  Family history Patient has one sister who has been treated for alcohol use at [facility].  Mental status examination Patient is casually dressed and fairly groomed. She appears anxious but pleasant. She is pleasant, cooperative and maintained good eye contact. Her speech is slow but clear and coherent. Her thought process is also slow but logical. She denies any active or passive suicidal thinking and homicidal thinking. She denies any auditory or visual hallucination. Her attention and concentration is fair.  Her speech is monotonous. She described her mood fine and her affect is mood congruent. She's alert and oriented x3. Her insight judgment and impulse control is fair.   Assessment Axis I mood disorder NOS rule out bipolar disorder type I, polysubstance dependence, alcohol abuse Axis II deferred Axis III see medical history Axis IV but moderate Axis V 60-65  Plan I will continue  Abilify, Vistaril and Cogentin .  I explained risks and benefits of medication in detail.  I recommend to call us if she is any question or concern about the medication or if she'll feel worsening of the symptoms.  I will see her again in 4 weeks.  We will consider seeing her every  2-3 months since then.

## 2011-10-26 ENCOUNTER — Ambulatory Visit (INDEPENDENT_AMBULATORY_CARE_PROVIDER_SITE_OTHER): Payer: 59 | Admitting: Internal Medicine

## 2011-10-26 ENCOUNTER — Encounter: Payer: Self-pay | Admitting: Internal Medicine

## 2011-10-26 VITALS — BP 118/74 | HR 87 | Temp 97.7°F | Wt 155.0 lb

## 2011-10-26 DIAGNOSIS — L0293 Carbuncle, unspecified: Secondary | ICD-10-CM

## 2011-10-26 DIAGNOSIS — L0292 Furuncle, unspecified: Secondary | ICD-10-CM | POA: Insufficient documentation

## 2011-10-26 MED ORDER — SULFAMETHOXAZOLE-TRIMETHOPRIM 800-160 MG PO TABS: 1.0000 | ORAL_TABLET | Freq: Two times a day (BID) | ORAL | Status: AC

## 2011-10-26 MED ORDER — MUPIROCIN 2 % EX OINT: TOPICAL_OINTMENT | Freq: Three times a day (TID) | CUTANEOUS | Status: AC

## 2011-10-26 NOTE — Assessment & Plan Note (Addendum)
2 superficial abscess, MRSA?Marland Kitchen Plan: Culture Bactrim Mupirocin (because could be MRSA ) We checked a pregnancy test (was negative) before initiation of antibiotics. Aware she cannot take antibiotics if pregnant.

## 2011-10-26 NOTE — Patient Instructions (Signed)
Take the antibiotic by mouth for  10 days Apply the ointment to your nose 3 times  a day for one week Call if not better Call anytime if you're getting worse, infection is spreading, you've a fever or  joint aches.

## 2011-10-26 NOTE — Progress Notes (Signed)
  Subjective:    Patient ID: [name], female    DOB: [birth year], 26 y.o.   MRN: [id number]  HPI Acute visit 2 weeks ago noted two boils at the right leg. Has not seen any discharge. She was seen recently at the urgent care, chart is reviewed, she had an abscess at the genital  Labia , culture showed a sensitive staph aureus, she was prescribed doxycycline.  Past Medical History  Diagnosis Date  . Gastric reflux   . Anxiety   . Basedow's disease   . Pyelonephritis   . History of nephrolithiasis   . Anxiety disorder   . Bipolar disorder      Review of Systems Denies any fever or chills.     Objective:   Physical Exam  Constitutional: She is oriented to person, place, and time. She appears well-developed. No distress.  Musculoskeletal: She exhibits no edema.  Neurological: She is alert and oriented to person, place, and time.  Skin: She is not diaphoretic.     Psychiatric: She has a normal mood and affect. Her behavior is normal. Judgment and thought content normal.      Assessment & Plan:

## 2011-10-29 LAB — WOUND CULTURE: Gram Stain: NONE SEEN

## 2011-11-05 ENCOUNTER — Encounter: Payer: Self-pay | Admitting: *Deleted

## 2011-11-15 ENCOUNTER — Ambulatory Visit (INDEPENDENT_AMBULATORY_CARE_PROVIDER_SITE_OTHER): Payer: 59 | Admitting: Psychiatry

## 2011-11-15 ENCOUNTER — Encounter (HOSPITAL_COMMUNITY): Payer: Self-pay | Admitting: Psychiatry

## 2011-11-15 VITALS — Wt 156.0 lb

## 2011-11-15 DIAGNOSIS — F319 Bipolar disorder, unspecified: Secondary | ICD-10-CM

## 2011-11-15 DIAGNOSIS — F39 Unspecified mood [affective] disorder: Secondary | ICD-10-CM

## 2011-11-15 MED ORDER — HYDROXYZINE PAMOATE 50 MG PO CAPS: 50.0000 mg | ORAL_CAPSULE | Freq: Two times a day (BID) | ORAL | Status: DC | PRN

## 2011-11-15 MED ORDER — BENZTROPINE MESYLATE 1 MG PO TABS: 1.0000 mg | ORAL_TABLET | Freq: Every day | ORAL | Status: DC

## 2011-11-15 MED ORDER — ARIPIPRAZOLE 10 MG PO TABS: 10.0000 mg | ORAL_TABLET | Freq: Every day | ORAL | Status: DC

## 2011-11-15 NOTE — Progress Notes (Signed)
Chief complaint Medication management and followup.   History of presenting illness Patient is 26 year old Caucasian unemployed single female who came for her followup appointment.  Patient is doing much better on her current medication.  She has some residual anxiety and panic attack however she likes Vistaril which helped her anxiety.  She is out of her Vistaril.  She sleeps better .  She still has some racing thoughts but overall her agitation anger and mood swings or under control.  She is planning to start her school at [employer] in fall.  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  She is not drinking or using any illegal substance.  She denies her mood has been stable most of the time.  She denies any agitation anger mood swing.  She is not seeing therapist who is recently retired however white to reestablished her counseling with a therapist in this office.  She denies any recent crying spells or violent behavior. She recently had a staph infection and given and erratic however she is improving with antibiotic.  Current psychiatric medication Abilify 10 mg daily Cogentin 1 mg at bedtime. Vistaril 25 mg as needed  Past psychiatric history Patient admitted history of depression, mood swings, anger and poor attention and concentration very early in her life. She remembered taking Paxil at fourth grade given by psychiatrist at [facility]. In her life she has taken Xanax Prozac Adderall Lexapro given by her psychiatrist, primary care physician and Dr. Evelene Croon. She admitted multiple ER visits for her temper problem. She also admitted history of overdose on Xanax in the past. She has at least one psychiatric admission which was October 2012. She admitted history of abusing Xanax and pain medication.  Medical history Patient has history of GERD, history of nephrolithiasis and UTI. Her primary care physician is Dr Laury Axon She is taking Levaquin for her kidney infection.  Recently she was seen at urgent care  for genital Abscess she is taking antibiotic.  Alcohol and substance use history Patient admitted history of drinking alcohol, marijuana use, abusing pain medication and at least one time cocaine. Patient admitted binge drinking with intoxication.  She claimed to be sober for past few months.    Psychosocial history Patient was born and raised in [place]. Her parent divorced 2 years ago. Patient was living with her grandmother, aunt and uncle however recently she is living with church based program. Her mother is trying to rent apartment close to her grandmother house. Patient mother worked in a hospital as an Charity fundraiser.  Education and work history Patient has some college education. She wants to become nervous however recently she dropped out from school. Patient wants to go back into school. She is not working.  Family history Patient has one sister who has been treated for alcohol use at [facility].  Mental status examination Patient is casually dressed and fairly groomed. She appears anxious but pleasant. She is pleasant, cooperative and maintained good eye contact. Her speech is slow but clear and coherent. Her thought process is also slow but logical. She denies any active or passive suicidal thinking and homicidal thinking. She denies any auditory or visual hallucination. Her attention and concentration is fair.  Her speech is monotonous. She described her mood is good and her affect is bright.  She's alert and oriented x3. Her insight judgment and impulse control is fair.   Assessment Axis I mood disorder NOS rule out bipolar disorder type I, polysubstance dependence, alcohol abuse Axis II deferred  Axis III see medical history Axis IV but moderate Axis V 60-65  Plan I will increase her Vistaril to 50 mg and she can take up to twice a day or anxiety and panic attack.  I will continue her Abilify and Cogentin at present does.  She does not have any side effects including any  tremors or shakes.  I recommend to call us if she is any question or concern about the medication or if she feel worsening of the symptoms.  I will schedule the appointment with a therapist in this office for coping and social skills.  I will see her again in 2 months.

## 2011-11-26 ENCOUNTER — Ambulatory Visit (HOSPITAL_COMMUNITY): Payer: 59 | Admitting: Psychology

## 2011-11-26 ENCOUNTER — Encounter (HOSPITAL_COMMUNITY): Payer: Self-pay | Admitting: Psychology

## 2011-11-26 DIAGNOSIS — F319 Bipolar disorder, unspecified: Secondary | ICD-10-CM

## 2011-11-26 NOTE — Progress Notes (Signed)
   THERAPIST PROGRESS NOTE  Session Time: 10:30am-11:30am  Participation Level: Active  Behavioral Response: Casual and Well GroomedDrowsyDepressed affect  Type of Therapy: Individual Therapy  Treatment Goals addressed: Diagnosis: Bipolar I D/O and goal 1.  Interventions: CBT and Other: Establishing goals for tx.  Summary: [name] is a 26 y.o. female who presents with depressed affect however reports feeling anxious today.  Pt thoughts seem slow and pt struggles to find words to express herself today.  Pt reported she did take one Vistaril b/f coming to tx and denies any other drug or alcohol use.  Pt reports she has returned to counseling as "I was told (by mom) that I needed counseling".  Pt reported that she has a lot of emotional pain and had a drinking problem in the past to avoid her pain. 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  Pt reported loss of motivation and not sure of what steps to take next- wanting to return to school.  Pt also reports a lot of confusion about relationship w/ boyfriend of 5 years- as family doesn't feel good for her but she really cares about.  Pt does feel medication helping as less temper as would get very mad easily and would be destructive or aggressive when angry.  Pt reported like to have herself back prior to stressors of grandfather dying, parents separating and grandmother being diagnosed w/ cancer.   Suicidal/Homicidal: Nowithout intent/plan  Therapist Response: Assessed pt current functioning per her report.  Explored w/pt her return to tx and motivation for tx- reviewing past tx w/ counselor Shonna Chock.  Explored w/ pt current symptoms and explored w/pt alcohol abuse and any current use.  Encouraged pt to participate in daily positive self care activities that she finds enjoyable and are congruent w/ wellness.   Plan: Return again in 1-2 weeks.  Diagnosis: Axis I: Bipolar,  Depressed    Axis II: No diagnosis    Kimm Ungaro, LPC 11/26/2011

## 2011-12-09 ENCOUNTER — Ambulatory Visit (HOSPITAL_COMMUNITY): Payer: Self-pay | Admitting: Psychology

## 2011-12-12 ENCOUNTER — Emergency Department
Admission: EM | Admit: 2011-12-12 | Discharge: 2011-12-12 | Disposition: A | Discharge status: Home / Self Care | Payer: 59 | Source: Home / Self Care

## 2011-12-12 ENCOUNTER — Encounter: Payer: Self-pay | Admitting: *Deleted

## 2011-12-12 DIAGNOSIS — N390 Urinary tract infection, site not specified: Secondary | ICD-10-CM

## 2011-12-12 LAB — POCT URINALYSIS DIP (MANUAL ENTRY)
Bilirubin, UA: NEGATIVE
Glucose, UA: NEGATIVE
Ketones, POC UA: NEGATIVE
Protein Ur, POC: NEGATIVE

## 2011-12-12 MED ORDER — ONDANSETRON HCL 4 MG/2ML IJ SOLN: 4.0000 mg | Freq: Once | INTRAMUSCULAR | Status: DC

## 2011-12-12 MED ORDER — CEFTRIAXONE SODIUM 1 G IJ SOLR
1.0000 g | Freq: Once | INTRAMUSCULAR | Status: AC
  Administered 2011-12-12: 1 g via INTRAMUSCULAR

## 2011-12-12 MED ORDER — KETOROLAC TROMETHAMINE 60 MG/2ML IM SOLN
60.0000 mg | Freq: Once | INTRAMUSCULAR | Status: AC
  Administered 2011-12-12: 60 mg via INTRAMUSCULAR

## 2011-12-12 MED ORDER — ONDANSETRON HCL 4 MG PO TABS
4.0000 mg | ORAL_TABLET | Freq: Once | ORAL | Status: AC
  Administered 2011-12-12: 4 mg via ORAL

## 2011-12-12 MED ORDER — ONDANSETRON HCL 4 MG PO TABS: 4.0000 mg | ORAL_TABLET | Freq: Three times a day (TID) | ORAL | Status: AC | PRN

## 2011-12-12 MED ORDER — LEVOFLOXACIN 500 MG PO TABS: 500.0000 mg | ORAL_TABLET | Freq: Every day | ORAL | Status: DC

## 2011-12-12 NOTE — ED Provider Notes (Signed)
History     CSN: [id number]  Arrival date & time 12/12/11  1342   First MD Initiated Contact with Patient 12/12/11 1349      Chief Complaint  Patient presents with  . Urinary Tract Infection    HPI Comments: DYSURIA Onset:  2-3 days  Description: increased urinary frequency, nausea, dysuria, L flank pain  Modifying factors:   Pt currently followed by [facility] by Dr. Jacquelyne Balint. Has had fairly extensive workup for interstitial cystitis and UTIs. Recently seen earlier in week by Dr. Jacquelyne Balint for similar sxs. Was placed on levaquin x 5days. Initially helped sxs, but returned yesterday.  Has been hospitalized for UTI x1 in the past.  Sxs are mild in comparison to previous UTIs per pt.   Symptoms Urgency:  yes Frequency: yes  Hesitancy:  no Hematuria:  no Flank Pain:  yes Fever: no Nausea/Vomiting:  yes Missed LMP: no STD exposure: no Discharge: no Irritants: no Rash: no  Red Flags   More than 3 UTI's last 12 months:  yes PMH of  Diabetes or Immunosuppression:  no Renal Disease/Calculi: no Urinary Tract Abnormality:  no Instrumentation or Trauma: no     Patient is a 26 y.o. female presenting with urinary tract infection.  Urinary Tract Infection  Noted multiple CT scans (negative) for GU complaints in the past.   Past Medical History  Diagnosis Date  . Gastric reflux   . Anxiety   . Basedow's disease   . Pyelonephritis   . History of nephrolithiasis   . Anxiety disorder   . Bipolar disorder     Past Surgical History  Procedure Date  . Tonsillectomy   . Adenoidectomy   . Tear duct probing     Family History  Problem Relation Age of Onset  . Diabetes    . Hypertension    . Lung cancer    . Bipolar disorder Sister     History  Substance Use Topics  . Smoking status: Current Everyday Smoker -- 0.2 packs/day for 8 years    Types: Cigarettes  . Smokeless tobacco: Never Used  . Alcohol Use: No     Couple months since last use of alcohol  states pt.  Pt reports that she would drink 1/2 of a fifth to take away emotional pain.     OB History    Grav Para Term Preterm Abortions TAB SAB Ect Mult Living                  Review of Systems  All other systems reviewed and are negative.    Allergies  Darvocet and Neurontin  Home Medications   Current Outpatient Rx  Name Route Sig Dispense Refill  . ARIPIPRAZOLE 10 MG PO TABS Oral Take 1 tablet (10 mg total) by mouth daily. 30 tablet 1  . BENZTROPINE MESYLATE 1 MG PO TABS Oral Take 1 tablet (1 mg total) by mouth daily. 30 tablet 1  . HYDROXYZINE PAMOATE 50 MG PO CAPS Oral Take 1 capsule (50 mg total) by mouth 2 (two) times daily as needed for itching. 60 capsule 1  . IBUPROFEN 800 MG PO TABS Oral Take 800 mg by mouth every 8 (eight) hours as needed. Pain.    Marland Kitchen LEVOFLOXACIN 500 MG PO TABS Oral Take 1 tablet (500 mg total) by mouth daily. 7 tablet 0    BP 109/75  Pulse 85  Temp 97.6 F (36.4 C) (Oral)  Resp 16  Ht 5\' 5"  (1.651 m)  Wt 158 lb 12 oz (72.009 kg)  BMI 26.42 kg/m2  SpO2 99%  Physical Exam  Constitutional: She appears well-developed and well-nourished.  HENT:  Head: Normocephalic and atraumatic.  Eyes: Conjunctivae are normal. Pupils are equal, round, and reactive to light.  Neck: Normal range of motion. Neck supple.  Cardiovascular: Normal rate and regular rhythm.   Pulmonary/Chest: Effort normal and breath sounds normal.  Abdominal: Soft.       + L flank pain  No suprapubic tenderness. + bowel sounds      ED Course  Procedures (including critical care time)   Labs Reviewed  POCT URINALYSIS DIP (MANUAL ENTRY)  URINE CULTURE   No results found.   No diagnosis found.    MDM  UA indicative of UTI recurrence.  Will place pt back on extended course of levaquin.  Rocephin 1gm IM x 1 here in clinic.  Zofran for nausea Toradol 60mg  IM x 1.  Discussed infectious red flags at length.  Plan to follow up with urology in 24 hours.  Go to  ED if sxs worsen before follow up.  Handout given.      The patient and/or caregiver has been counseled thoroughly with regard to treatment plan and/or medications prescribed including dosage, schedule, interactions, rationale for use, and possible side effects and they verbalize understanding. Diagnoses and expected course of recovery discussed and will return if not improved as expected or if the condition worsens. Patient and/or caregiver verbalized understanding.               Floydene Flock, MD 12/12/11 1501

## 2011-12-12 NOTE — ED Provider Notes (Signed)
Patient was seen by Dr. Newton  Kimala Horne H Sani Loiseau, MD 12/12/11 1758 

## 2011-12-12 NOTE — ED Notes (Signed)
Pt complaing of dysuria and L flank pain starting today.

## 2011-12-12 NOTE — Discharge Instructions (Signed)

## 2011-12-15 ENCOUNTER — Telehealth: Payer: Self-pay | Admitting: *Deleted

## 2011-12-15 LAB — URINE CULTURE: Colony Count: >=100000

## 2011-12-22 ENCOUNTER — Ambulatory Visit (HOSPITAL_COMMUNITY): Payer: Self-pay | Admitting: Psychology

## 2012-01-17 ENCOUNTER — Ambulatory Visit (INDEPENDENT_AMBULATORY_CARE_PROVIDER_SITE_OTHER): Payer: 59 | Admitting: Psychiatry

## 2012-01-17 ENCOUNTER — Encounter (HOSPITAL_COMMUNITY): Payer: Self-pay | Admitting: Psychiatry

## 2012-01-17 VITALS — BP 114/78 | HR 103 | Wt 168.0 lb

## 2012-01-17 DIAGNOSIS — F39 Unspecified mood [affective] disorder: Secondary | ICD-10-CM

## 2012-01-17 DIAGNOSIS — F1920 Other psychoactive substance dependence, uncomplicated: Secondary | ICD-10-CM

## 2012-01-17 DIAGNOSIS — F319 Bipolar disorder, unspecified: Secondary | ICD-10-CM

## 2012-01-17 DIAGNOSIS — F1010 Alcohol abuse, uncomplicated: Secondary | ICD-10-CM

## 2012-01-17 MED ORDER — BENZTROPINE MESYLATE 1 MG PO TABS: 1.0000 mg | ORAL_TABLET | Freq: Every day | ORAL | Status: DC

## 2012-01-17 MED ORDER — HYDROXYZINE PAMOATE 50 MG PO CAPS: 50.0000 mg | ORAL_CAPSULE | Freq: Two times a day (BID) | ORAL | Status: DC | PRN

## 2012-01-17 MED ORDER — ARIPIPRAZOLE 10 MG PO TABS: 10.0000 mg | ORAL_TABLET | Freq: Every day | ORAL | Status: DC

## 2012-01-17 NOTE — Progress Notes (Signed)
Chief complaint Medication management and followup.   History of presenting illness Patient is 26 year old Caucasian unemployed single female who came for her followup appointment.  She has some anxiety and stress as she is going to start her school on 66 of this month.  She admitted sometime poor sleep and thinking about the school .  She admitted drinking a few days ago few beers with her friend but she denies any binge drinking intoxication or any other illegal substances.  She's also worried about her insurance has next month she will be 26 years old and no home her stay on her mother's insurance.  She is actively looking for a job.  She has a job interview at [employer] and she is hoping that she can get and hours .  She likes her current psychiatric medication .  She denies any recent agitation anger mood swing.  She is concerned about her weight gain but she also endorse that she is not active during the day.  She has gained 30 pounds in past 6 months.  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  She is also hoping that getting a job will make her more motivated to do things.  She is seeing therapist in this office for coping skills.  She's compliant with the medication especially Vistaril which is helping her anxiety.    Current psychiatric medication Abilify 10 mg daily Cogentin 1 mg at bedtime. Vistaril 25 mg as needed  Past psychiatric history Patient admitted history of depression, mood swings, anger and poor attention and concentration very early in her life. She remembered taking Paxil at fourth grade given by psychiatrist at [facility]. In her life she has taken Xanax Prozac Adderall Lexapro given by her psychiatrist, primary care physician and Dr. Evelene Croon. She admitted multiple ER visits for her temper problem. She also admitted history of overdose on Xanax in the past. She has at least one  psychiatric admission which was October 2012. She admitted history of abusing Xanax and pain medication.  Medical history Patient has history of GERD, history of nephrolithiasis and UTI. Her primary care physician is Dr Laury Axon She is taking Levaquin for her kidney infection.  Recently she was seen at urgent care for genital Abscess she is taking antibiotic.  Alcohol and substance use history Patient admitted history of drinking alcohol, marijuana use, abusing pain medication and at least one time cocaine. she claims sober for using illegal substance however she still drinks on and off. Patient admitted binge drinking with intoxication in the past.    Psychosocial history Patient was born and raised in [place]. Her parent divorced 2 years ago. Patient was living with her grandmother, aunt and uncle however recently she is living with church based program. Her mother is trying to rent apartment close to her grandmother house. Patient mother worked in a hospital as an Charity fundraiser.  Education and work history Patient has some college education. She wants to become nervous however recently she dropped out from school. Patient wants to go back into school. She is not working.  Family history Patient has one sister who has been [facility] for [facility] use [facility].  Mental status examination Patient is casually dressed and fairly groomed. She appears anxious but pleasant. She is pleasant, cooperative and maintained good eye contact. Her speech is slow but clear and coherent. Her thought process is  also slow but logical. She denies any active or passive suicidal thinking and homicidal thinking. She denies any auditory or visual hallucination. Her attention and concentration is fair.  Her speech is monotonous. She described her mood is good and her affect is bright.  She's alert and oriented x3. Her insight judgment and impulse control is fair.   Assessment Axis I mood disorder NOS rule out  bipolar disorder type I, polysubstance dependence, alcohol abuse Axis II deferred Axis III see medical history Axis IV but moderate Axis V 60-65  Plan I  have a long discussion with the patient about her weight gain symptoms .  I strongly encouraged her to watch her calorie intake and to regular exercise.  I offered to lower Abilify however patient does not want to reduce her Abilify since she is feeling much better with the current dose.  I explained the risks and benefits of medication and recommended to call us if she has any question or concern if he feel worsening of the symptoms.  She will start school in 3 weeks .   I will 3 months.  Time spent 30 minutes.  Portion of this note is generated with voice dictation software and may contain typographical error.

## 2012-02-28 ENCOUNTER — Ambulatory Visit (INDEPENDENT_AMBULATORY_CARE_PROVIDER_SITE_OTHER): Payer: 59 | Admitting: Family Medicine

## 2012-02-28 ENCOUNTER — Encounter: Payer: Self-pay | Admitting: Family Medicine

## 2012-02-28 VITALS — BP 108/64 | HR 87 | Temp 98.1°F | Wt 172.2 lb

## 2012-02-28 DIAGNOSIS — W57XXXA Bitten or stung by nonvenomous insect and other nonvenomous arthropods, initial encounter: Secondary | ICD-10-CM | POA: Insufficient documentation

## 2012-02-28 DIAGNOSIS — L0390 Cellulitis, unspecified: Secondary | ICD-10-CM

## 2012-02-28 DIAGNOSIS — T148XXA Other injury of unspecified body region, initial encounter: Secondary | ICD-10-CM

## 2012-02-28 DIAGNOSIS — L0291 Cutaneous abscess, unspecified: Secondary | ICD-10-CM

## 2012-02-28 MED ORDER — DOXYCYCLINE HYCLATE 100 MG PO TABS: 100.0000 mg | ORAL_TABLET | Freq: Two times a day (BID) | ORAL | Status: DC

## 2012-02-28 NOTE — Assessment & Plan Note (Signed)
+   rash--- doxy rx Check labs

## 2012-02-28 NOTE — Assessment & Plan Note (Signed)
r thigh Doxy for 10 days Warm compresses rto prn ---refer to surgeon if no improvement

## 2012-02-28 NOTE — Patient Instructions (Addendum)
Deer Tick Bite Deer ticks are brown arachnids (spider family) that vary in size from as small as the head of a pin to 1/4 inch (1/2 cm) diameter. They thrive in wooded areas. Deer are the preferred host of adult deer ticks. Small rodents are the host of young ticks (nymphs). When a person walks in a field or wooded area, young and adult ticks in the surrounding grass and vegetation can attach themselves to the skin. They can suck blood for hours to days if unnoticed. Ticks are found all over the [place] Some ticks carry a specific bacteria (Borrelia burgdorferi) that causes an infection called Lyme disease. The bacteria is typically passed into a person during the blood sucking process. This happens after the tick has been attached for at least a number of hours. While ticks can be found all over the [place] those carrying the bacteria that causes Lyme disease are most common in [place]. Only a small proportion of ticks in these areas carry the Lyme disease bacteria and cause human infections. Ticks usually attach to warm spots on the body, such as the:  Head.   Back.   Neck.   Armpits.   Groin.  SYMPTOMS  Most of the time, a deer tick bite will not be felt. You may or may not see the attached tick. You may notice mild irritation or redness around the bite site. If the deer tick passes the Lyme disease bacteria to a person, a round, red rash may be noticed 2 to 3 days after the bite. The rash may be clear in the middle, like a bull's-eye or target. If not treated, other symptoms may develop several days to weeks after the onset of the rash. These symptoms may include:  New rash lesions.   Fatigue and weakness.   General ill feeling and achiness.   Chills.   Headache and neck pain.   Swollen lymph glands.   Sore muscles and joints.  5 to 15% of untreated people with Lyme disease may develop more severe illnesses after several weeks to months. This may include inflammation  of the brain lining (meningitis), nerve palsies, an abnormal heartbeat, or severe muscle and joint pain and inflammation (myositis or arthritis). DIAGNOSIS   Physical exam and medical history.   Viewing the tick if it was saved for confirmation.   Blood tests (to check or confirm the presence of Lyme disease).  TREATMENT  Most ticks do not carry disease. If found, an attached tick should be removed using tweezers. Tweezers should be placed under the body of the tick so it is removed by its attachment parts (pincers). If there are signs or symptoms of being sick, or Lyme disease is confirmed, medicines (antibiotics) that kill germs are usually prescribed. In more severe cases, antibiotics may be given through an intravenous (IV) access. HOME CARE INSTRUCTIONS   Always remove ticks with tweezers. Do not use petroleum jelly or other methods to kill or remove the tick. Slide the tweezers under the body and pull out as much as you can. If you are not sure what it is, save it in a jar and show your caregiver.   Once you remove the tick, the skin will heal on its own. Wash your hands and the affected area with water and soap. You may place a bandage on the affected area.   Take medicine as directed. You may be advised to take a full course of antibiotics.   Follow up  be advised to take a full course of antibiotics.   Follow up with your caregiver as recommended.  FINDING OUT THE RESULTS OF YOUR TEST  Not all test results are available during your visit. If your test results are not back during the visit, make an appointment with your caregiver to find out the results. Do not assume everything is normal if you have not heard from your caregiver or the medical facility. It is important for you to follow up on all of your test results.  PROGNOSIS   If Lyme disease is confirmed, early treatment with antibiotics is very effective. Following preventive guidelines is important since it is possible to get the disease more than once.  PREVENTION    Wear long sleeves and long pants in  wooded or grassy areas. Tuck your pants into your socks.   Use an insect repellent while hiking.   Check yourself, your children, and your pets regularly for ticks after playing outside.   Clear piles of leaves or brush from your yard. Ticks might live there.  SEEK MEDICAL CARE IF:    You or your child has an oral temperature above 102 F (38.9 C).   You develop a severe headache following the bite.   You feel generally ill.   You notice a rash.   You are having trouble removing the tick.   The bite area has red skin or yellow drainage.  SEEK IMMEDIATE MEDICAL CARE IF:    Your face is weak and droopy or you have other neurological symptoms.   You have severe joint pain or weakness.  MAKE SURE YOU:    Understand these instructions.   Will watch your condition.   Will get help right away if you are not doing well or get worse.  FOR MORE INFORMATION  Centers for Disease Control and Prevention: www.cdc.gov  American Academy of Family Physicians: www.aafp.org  Document Released: 08/25/2009 Document Revised: 05/20/2011 Document Reviewed: 08/25/2009  ExitCare Patient Information 2012 [employer].

## 2012-02-28 NOTE — Progress Notes (Signed)
  Subjective:    Patient ID: [name], female    DOB: [birth year], 26 y.o.   MRN: [id number]  HPI Pt here c/o abscess on R thigh.   Increasing pain and errythema-- no d/c, no fever.  Pt also c/o tick bite about eight days ago and rash on upper ext.   No other complaints.     Review of Systems As above    Objective:   Physical Exam  Constitutional: She is oriented to person, place, and time. She appears well-developed and well-nourished.  Neurological: She is alert and oriented to person, place, and time.  Skin:       + abscess R thight---tender to touch No d/c + papular rash both arms ---tick bite behind R ear  Psychiatric: She has a normal mood and affect. Her behavior is normal.          Assessment & Plan:

## 2012-02-29 LAB — CBC WITH DIFFERENTIAL/PLATELET
Basophils Absolute: 0 10*3/uL (ref 0.0–0.1)
HCT: 43.6 % (ref 36.0–46.0)
Lymphs Abs: 1.5 10*3/uL (ref 0.7–4.0)
Monocytes Absolute: 0.4 10*3/uL (ref 0.1–1.0)
Monocytes Relative: 3.1 % (ref 3.0–12.0)
Platelets: 201 10*3/uL (ref 150.0–400.0)
RDW: 14.3 % (ref 11.5–14.6)

## 2012-02-29 LAB — BASIC METABOLIC PANEL
BUN: 11 mg/dL (ref 6–23)
Calcium: 8.6 mg/dL (ref 8.4–10.5)
Creatinine, Ser: 0.9 mg/dL (ref 0.4–1.2)
GFR: 78.41 mL/min (ref >60.00)
Glucose, Bld: 91 mg/dL (ref 70–99)
Potassium: 4.1 mEq/L (ref 3.5–5.1)

## 2012-02-29 LAB — HEPATIC FUNCTION PANEL
AST: 18 U/L (ref 0–37)
Total Bilirubin: 0.4 mg/dL (ref 0.3–1.2)

## 2012-02-29 LAB — ROCKY MTN SPOTTED FVR ABS PNL(IGG+IGM)
RMSF IgG: 0.11 IV
RMSF IgM: 0.07 IV

## 2012-03-17 ENCOUNTER — Encounter (HOSPITAL_COMMUNITY): Payer: Self-pay

## 2012-03-17 ENCOUNTER — Emergency Department (HOSPITAL_COMMUNITY)
Admission: EM | Admit: 2012-03-17 | Discharge: 2012-03-17 | Disposition: A | Discharge status: Home / Self Care | Payer: 59 | Attending: Emergency Medicine | Admitting: Emergency Medicine

## 2012-03-17 DIAGNOSIS — F319 Bipolar disorder, unspecified: Secondary | ICD-10-CM | POA: Insufficient documentation

## 2012-03-17 DIAGNOSIS — K219 Gastro-esophageal reflux disease without esophagitis: Secondary | ICD-10-CM | POA: Insufficient documentation

## 2012-03-17 DIAGNOSIS — F17200 Nicotine dependence, unspecified, uncomplicated: Secondary | ICD-10-CM | POA: Insufficient documentation

## 2012-03-17 DIAGNOSIS — E0500 Thyrotoxicosis with diffuse goiter without thyrotoxic crisis or storm: Secondary | ICD-10-CM | POA: Insufficient documentation

## 2012-03-17 DIAGNOSIS — N390 Urinary tract infection, site not specified: Secondary | ICD-10-CM | POA: Insufficient documentation

## 2012-03-17 DIAGNOSIS — F411 Generalized anxiety disorder: Secondary | ICD-10-CM | POA: Insufficient documentation

## 2012-03-17 LAB — URINALYSIS, ROUTINE W REFLEX MICROSCOPIC
Nitrite: NEGATIVE
Protein, ur: NEGATIVE mg/dL
Urobilinogen, UA: 0.2 mg/dL (ref 0.0–1.0)

## 2012-03-17 LAB — URINE CULTURE: Colony Count: NO GROWTH

## 2012-03-17 LAB — POCT PREGNANCY, URINE: Preg Test, Ur: NEGATIVE

## 2012-03-17 MED ORDER — SULFAMETHOXAZOLE-TRIMETHOPRIM 800-160 MG PO TABS
1.0000 | ORAL_TABLET | Freq: Two times a day (BID) | ORAL | Status: DC
Start: 2012-03-17 — End: 2012-06-04

## 2012-03-17 NOTE — ED Provider Notes (Signed)
History     CSN: [id number]  Arrival date & time 03/17/12  7829   First MD Initiated Contact with Patient 03/17/12 [phone]      Chief Complaint  Patient presents with  . Flank Pain    (Consider location/radiation/quality/duration/timing/severity/associated sxs/prior treatment) HPI CC: L sided pain  L sided pain: Started 3 days ago. Onset w/ urination and improves approximately 1hr after urination. Pain is sharp and does not radiate. Has had identical symptoms before w/ last UTI. Denies fever, rash, nausea, emesis, HA, vaginal discharge/irritation.   Past Medical History  Diagnosis Date  . Gastric reflux   . Anxiety   . Basedow's disease   . Pyelonephritis   . History of nephrolithiasis   . Anxiety disorder   . Bipolar disorder     Past Surgical History  Procedure Date  . Tonsillectomy   . Adenoidectomy   . Tear duct probing     Family History  Problem Relation Age of Onset  . Diabetes    . Hypertension    . Lung cancer    . Bipolar disorder Sister     History  Substance Use Topics  . Smoking status: Current Every Day Smoker -- 0.2 packs/day for 8 years    Types: Cigarettes  . Smokeless tobacco: Never Used  . Alcohol Use: No     Couple months since last use of alcohol states pt.  Pt reports that she would drink 1/2 of a fifth to take away emotional pain.     OB History    Grav Para Term Preterm Abortions TAB SAB Ect Mult Living                  Review of Systems  All other systems reviewed and are negative.    Allergies  Darvocet and Neurontin  Home Medications   Current Outpatient Rx  Name Route Sig Dispense Refill  . ARIPIPRAZOLE 10 MG PO TABS Oral Take 10 mg by mouth daily.    Marland Kitchen BENZTROPINE MESYLATE 1 MG PO TABS Oral Take 1 mg by mouth daily.    Marland Kitchen HYDROXYZINE PAMOATE 50 MG PO CAPS Oral Take 50 mg by mouth 2 (two) times daily as needed.    . IBUPROFEN 800 MG PO TABS Oral Take 800 mg by mouth every 8 (eight) hours as needed. Pain.    Marland Kitchen  PHENAZOPYRIDINE HCL 95 MG PO TABS Oral Take 95 mg by mouth 3 (three) times daily as needed.    . SULFAMETHOXAZOLE-TRIMETHOPRIM 800-160 MG PO TABS Oral Take 1 tablet by mouth 2 (two) times daily. 20 tablet 0    BP 117/82  Pulse 94  Temp 98.2 F (36.8 C) (Oral)  Resp 18  SpO2 99%  LMP 03/03/2012  Physical Exam  Constitutional: She appears well-nourished.  HENT:  Head: Normocephalic.  Eyes: EOM are normal.  Neck: Normal range of motion.  Cardiovascular: Normal rate and intact distal pulses.   Pulmonary/Chest: Effort normal.  Abdominal: Soft. Bowel sounds are normal. She exhibits no distension and no mass. There is no tenderness. There is no rebound and no guarding.  Musculoskeletal: Normal range of motion. She exhibits no edema.       L sided CVA tenderness  Neurological: She is alert.  Skin: Skin is warm and dry. She is not diaphoretic.  Psychiatric: She has a normal mood and affect. Her behavior is normal.   ED Course  Procedures (including critical care time)  Labs Reviewed  URINALYSIS, ROUTINE W REFLEX MICROSCOPIC -  Abnormal; Notable for the following:    APPearance CLOUDY (*)     Hgb urine dipstick MODERATE (*)     Leukocytes, UA MODERATE (*)     All other components within normal limits  URINE MICROSCOPIC-ADD ON - Abnormal; Notable for the following:    Squamous Epithelial / LPF FEW (*)     Bacteria, UA MANY (*)  CHECKED   All other components within normal limits  POCT PREGNANCY, URINE  URINE CULTURE   No results found.   No diagnosis found.    MDM  Beto.Dent F w/ UTI. - Significant resistance in the past w/ UTI.  - Treated w/ Bactrim last time due to resistance - Will treat w/ Bactrim BID x10 days - Cultures sent.  - pt to f/u w/ PCP - Handout given         Ozella Rocks, MD 03/17/12 1610  Ozella Rocks, MD 03/17/12 9604  Ozella Rocks, MD 03/31/12 [phone]

## 2012-03-17 NOTE — ED Notes (Signed)
Pt with c/o left flank pain beginning on Tuesday, taking OTC meds with no relief

## 2012-04-01 NOTE — ED Provider Notes (Signed)
I have personally seen and examined the patient.  I have discussed the plan of care with the resident.  I have reviewed the documentation on PMH/FH/Soc. History.  I have reviewed the documentation of the resident and agree.  Pt stable in ED, no distress noted Appropriate for outpatient management   Joya Gaskins, MD 04/01/12 6213

## 2012-04-19 ENCOUNTER — Ambulatory Visit (HOSPITAL_COMMUNITY): Payer: Self-pay | Admitting: Psychiatry

## 2012-06-04 ENCOUNTER — Encounter: Payer: Self-pay | Admitting: Emergency Medicine

## 2012-06-04 ENCOUNTER — Emergency Department
Admission: EM | Admit: 2012-06-04 | Discharge: 2012-06-04 | Disposition: A | Discharge status: Home / Self Care | Payer: Self-pay | Source: Home / Self Care | Attending: Family Medicine | Admitting: Family Medicine

## 2012-06-04 DIAGNOSIS — N3000 Acute cystitis without hematuria: Secondary | ICD-10-CM

## 2012-06-04 LAB — POCT URINALYSIS DIP (MANUAL ENTRY)
Bilirubin, UA: NEGATIVE
Ketones, POC UA: NEGATIVE
Leukocytes, UA: NEGATIVE
pH, UA: 5.5 (ref 5–8)

## 2012-06-04 MED ORDER — FLUCONAZOLE 150 MG PO TABS: 150.0000 mg | ORAL_TABLET | Freq: Once | ORAL | Status: DC

## 2012-06-04 MED ORDER — SULFAMETHOXAZOLE-TMP DS 800-160 MG PO TABS: 1.0000 | ORAL_TABLET | Freq: Two times a day (BID) | ORAL | Status: DC

## 2012-06-04 NOTE — ED Provider Notes (Signed)
History     CSN: [id number]  Arrival date & time 06/04/12  1359   First MD Initiated Contact with Patient 06/04/12 1427      Chief Complaint  Patient presents with  . Dysuria  . Flank Pain  . Urinary Frequency  . Nausea      HPI Comments: Reports onset frequency, dysuria and right flank pain last evening, with some nausea   Patient is a 26 y.o. female presenting with dysuria. The history is provided by the patient.  Dysuria  This is a new problem. The current episode started yesterday. The problem occurs every urination. The problem has been gradually worsening. The quality of the pain is described as burning. The pain is mild. There has been no fever. Associated symptoms include nausea, frequency, hesitancy, urgency and flank pain. Pertinent negatives include no chills, no sweats, no vomiting, no discharge and no hematuria. She has tried nothing for the symptoms.    Past Medical History  Diagnosis Date  . Gastric reflux   . Anxiety   . Basedow's disease   . Pyelonephritis   . History of nephrolithiasis   . Anxiety disorder   . Bipolar disorder     Past Surgical History  Procedure Date  . Tonsillectomy   . Adenoidectomy   . Tear duct probing     Family History  Problem Relation Age of Onset  . Diabetes    . Hypertension    . Lung cancer    . Bipolar disorder Sister     History  Substance Use Topics  . Smoking status: Current Every Day Smoker -- 0.2 packs/day for 8 years    Types: Cigarettes  . Smokeless tobacco: Never Used  . Alcohol Use: No     Comment: Couple months since last use of alcohol states pt.  Pt reports that she would drink 1/2 of a fifth to take away emotional pain.     OB History    Grav Para Term Preterm Abortions TAB SAB Ect Mult Living                  Review of Systems  Constitutional: Negative for chills.  Gastrointestinal: Positive for nausea. Negative for vomiting.  Genitourinary: Positive for dysuria, hesitancy, urgency,  frequency and flank pain. Negative for hematuria.  All other systems reviewed and are negative.    Allergies  Darvocet and Neurontin  Home Medications   Current Outpatient Rx  Name  Route  Sig  Dispense  Refill  . LEVOFLOXACIN 500 MG PO TABS   Oral   Take 500 mg by mouth daily.         . ARIPIPRAZOLE 10 MG PO TABS   Oral   Take 10 mg by mouth daily.         Marland Kitchen BENZTROPINE MESYLATE 1 MG PO TABS   Oral   Take 1 mg by mouth daily.         Marland Kitchen FLUCONAZOLE 150 MG PO TABS   Oral   Take 1 tablet (150 mg total) by mouth once. Repeat in two days   2 tablet   1   . HYDROXYZINE PAMOATE 50 MG PO CAPS   Oral   Take 50 mg by mouth 2 (two) times daily as needed.         . IBUPROFEN 800 MG PO TABS   Oral   Take 800 mg by mouth every 8 (eight) hours as needed. Pain.         Marland Kitchen  SULFAMETHOXAZOLE-TMP DS 800-160 MG PO TABS   Oral   Take 1 tablet by mouth 2 (two) times daily.   10 tablet   0     BP 119/82  Pulse 92  Temp 98 F (36.7 C) (Oral)  Resp 16  Ht 5\' 5"  (1.651 m)  Wt 182 lb (82.555 kg)  BMI 30.29 kg/m2  SpO2 98%  LMP 05/04/2012  Physical Exam Nursing notes and Vital Signs reviewed. Appearance:  Patient appears stated age, and in no acute distress.  Patient is obese (BMI 30.3) Eyes:  Pupils are equal, round, and reactive to light and accomodation.  Extraocular movement is intact.  Conjunctivae are not inflamed  Ears:  Canals normal.  Tympanic membranes normal.  Nose:   Normal Pharynx:  Normal Neck:  Supple.  No adenopathy  Lungs:  Clear to auscultation.  Breath sounds are equal.  Heart:  Regular rate and rhythm without murmurs, rubs, or gallops.  Abdomen:  Nontender without masses or hepatosplenomegaly.  Bowel sounds are present.  No CVA or flank tenderness.  Extremities:  No edema.  No calf tenderness Skin:  No rash present.   ED Course  Procedures  none   Labs Reviewed  URINE CULTURE pending  POCT URINALYSIS DIP (MANUAL ENTRY) Moderate BLO;  positive NIT      1. Acute cystitis       MDM  Urine culture pending Begin Septra DS.  Rx for Diflucan Increase fluid intake.  May continue Azo, two tabs three times daily for two days. Followup with Family Doctor if not improved in 5 days.  Red flags discussed        Lattie Haw, MD 06/06/12 [phone]

## 2012-06-04 NOTE — ED Notes (Signed)
Reports onset frequency, dysuria and right flank pain last evening, with some nausea

## 2012-06-06 LAB — URINE CULTURE

## 2012-06-08 ENCOUNTER — Telehealth: Payer: Self-pay | Admitting: Emergency Medicine

## 2012-06-16 ENCOUNTER — Encounter (HOSPITAL_COMMUNITY): Payer: Self-pay | Admitting: Psychology

## 2012-06-16 DIAGNOSIS — F319 Bipolar disorder, unspecified: Secondary | ICD-10-CM

## 2012-06-16 NOTE — Progress Notes (Signed)
Patient ID: [name], female   DOB: 1986-05-13, 27 y.o.   MRN: [id number] Outpatient Therapist Discharge Summary  [name]    1985/11/05   Admission Date: 11/26/11   Discharge Date:  06/16/12 Reason for Discharge:  Pt not active in counseling since 11/2011- canceling f/u appts Diagnosis:    1. Bipolar 1 disorder       Comments:  Pt to continue w/ prescribing provider.  Forde Radon

## 2012-08-22 ENCOUNTER — Encounter (HOSPITAL_COMMUNITY): Payer: Self-pay | Admitting: Emergency Medicine

## 2012-08-22 ENCOUNTER — Emergency Department (INDEPENDENT_AMBULATORY_CARE_PROVIDER_SITE_OTHER)
Admission: EM | Admit: 2012-08-22 | Discharge: 2012-08-22 | Disposition: A | Discharge status: Home / Self Care | Payer: Self-pay | Source: Home / Self Care | Attending: Emergency Medicine | Admitting: Emergency Medicine

## 2012-08-22 DIAGNOSIS — L0291 Cutaneous abscess, unspecified: Secondary | ICD-10-CM

## 2012-08-22 MED ORDER — HYDROCODONE-ACETAMINOPHEN 5-325 MG PO TABS
ORAL_TABLET | ORAL | Status: AC
  Filled 2012-08-22: qty 1

## 2012-08-22 MED ORDER — LIDOCAINE HCL (PF) 1 % IJ SOLN
INTRAMUSCULAR | Status: AC
  Filled 2012-08-22: qty 5

## 2012-08-22 MED ORDER — HYDROCODONE-ACETAMINOPHEN 5-325 MG PO TABS: ORAL_TABLET | ORAL | Status: DC

## 2012-08-22 MED ORDER — CEPHALEXIN 500 MG PO CAPS: 500.0000 mg | ORAL_CAPSULE | Freq: Three times a day (TID) | ORAL | Status: DC

## 2012-08-22 MED ORDER — SULFAMETHOXAZOLE-TMP DS 800-160 MG PO TABS: 2.0000 | ORAL_TABLET | Freq: Two times a day (BID) | ORAL | Status: DC

## 2012-08-22 MED ORDER — CEFTRIAXONE SODIUM 1 G IJ SOLR
1.0000 g | Freq: Once | INTRAMUSCULAR | Status: AC
  Administered 2012-08-22: 1 g via INTRAMUSCULAR

## 2012-08-22 MED ORDER — CEFTRIAXONE SODIUM 1 G IJ SOLR
INTRAMUSCULAR | Status: AC
  Filled 2012-08-22: qty 10

## 2012-08-22 MED ORDER — HYDROCODONE-ACETAMINOPHEN 5-325 MG PO TABS
2.0000 | ORAL_TABLET | Freq: Once | ORAL | Status: AC
  Administered 2012-08-22: 2 via ORAL

## 2012-08-22 NOTE — ED Notes (Signed)
Pt to be discharged at 5:30

## 2012-08-22 NOTE — ED Notes (Signed)
Pt c/o infection of left lower abdomen since the 7th of march. Area is draining pus and blood. Area is red and hot to touch, very pain full. Pt has a hx of staff infection last year. Pt has used hot compresses with no relief.

## 2012-08-22 NOTE — ED Provider Notes (Signed)
Chief Complaint:   Chief Complaint  Patient presents with  . Abscess    skin infection with drainage lower left abdomen since march 7    History of Present Illness:    [name] is a 27 year old female who has had a four-day history of an abscess in her left groin that has been draining. This is very painful and tender to touch. She's not had any fever or chills. She denies any prior history of abscesses, skin infections, or MRSA.  Review of Systems:  Other than noted above, the patient denies any of the following symptoms: Systemic:  No fever, chills or sweats. Skin:  No rash or itching.  PMFSH:  Past medical history, family history, social history, meds, and allergies were reviewed.  No history of diabetes or prior history of abscesses or MRSA. No medication allergies, medications, or other illnesses.  Physical Exam:   Vital signs:  BP 126/82  Pulse 87  Temp(Src) 97.7 F (36.5 C) (Oral)  Resp 20  SpO2 100%  LMP 08/06/2012 Skin:  There was a draining sinus in the left groin area with a large surrounding area of erythema measuring 23 x 8 cm. This was extremely tender to palpation.  The extent of erythema was marked with a dermatographic pen. Skin exam was otherwise normal.  No rash. Ext:  Distal pulses were full, patient has full ROM of all joints.    Course in Urgent Care Center:   Given Rocephin 1 g IM.  Assessment:  The encounter diagnosis was Abscess.  Since there was no fluctuance, this does not need to be incised and drained. A culture was made in the drainage from the sinus tract. The patient will return again in 48 hours for recheck. If she is not getting better she will need to go to the hospital for the IV cellulitis protocol.  Plan:   1.  The following meds were prescribed:   Discharge Medication List as of 08/22/2012  5:01 PM    START taking these medications   Details  cephALEXin (KEFLEX) 500 MG capsule Take 1 capsule (500 mg total) by mouth 3 (three)  times daily., Starting 08/22/2012, Until Discontinued, Normal    HYDROcodone-acetaminophen (NORCO/VICODIN) 5-325 MG per tablet 1 to 2 tabs every 4 to 6 hours as needed for pain., Print    !! sulfamethoxazole-trimethoprim (BACTRIM DS) 800-160 MG per tablet Take 2 tablets by mouth 2 (two) times daily., Starting 08/22/2012, Until Discontinued, Normal     !! - Potential duplicate medications found. Please discuss with provider.     2.  The patient was instructed in symptomatic care and handouts were given. 3.  The patient was instructed to leave the dressing in place and return again in 48 hours for packing removal.  Given red flag symptoms such as worsening pain, fever, or vomiting, that would indicate earlier return.   Reuben Likes, MD 08/22/12 [phone]

## 2012-08-24 ENCOUNTER — Emergency Department (INDEPENDENT_AMBULATORY_CARE_PROVIDER_SITE_OTHER)
Admission: EM | Admit: 2012-08-24 | Discharge: 2012-08-24 | Disposition: A | Discharge status: Home / Self Care | Payer: Self-pay | Source: Home / Self Care | Attending: Emergency Medicine | Admitting: Emergency Medicine

## 2012-08-24 ENCOUNTER — Encounter (HOSPITAL_COMMUNITY): Payer: Self-pay | Admitting: Emergency Medicine

## 2012-08-24 DIAGNOSIS — L0291 Cutaneous abscess, unspecified: Secondary | ICD-10-CM

## 2012-08-24 MED ORDER — CEFTRIAXONE SODIUM 1 G IJ SOLR
INTRAMUSCULAR | Status: AC
  Filled 2012-08-24: qty 10

## 2012-08-24 MED ORDER — HYDROMORPHONE HCL PF 1 MG/ML IJ SOLN
INTRAMUSCULAR | Status: AC
  Filled 2012-08-24: qty 2

## 2012-08-24 MED ORDER — ONDANSETRON HCL 4 MG/2ML IJ SOLN
4.0000 mg | Freq: Once | INTRAMUSCULAR | Status: AC
  Administered 2012-08-24: 4 mg via INTRAMUSCULAR

## 2012-08-24 MED ORDER — HYDROMORPHONE HCL PF 1 MG/ML IJ SOLN
2.0000 mg | Freq: Once | INTRAMUSCULAR | Status: AC
  Administered 2012-08-24: 2 mg via INTRAMUSCULAR

## 2012-08-24 MED ORDER — ONDANSETRON HCL 4 MG/2ML IJ SOLN
INTRAMUSCULAR | Status: AC
  Filled 2012-08-24: qty 2

## 2012-08-24 MED ORDER — CEFTRIAXONE SODIUM 1 G IJ SOLR
1.0000 g | Freq: Once | INTRAMUSCULAR | Status: AC
  Administered 2012-08-24: 1 g via INTRAMUSCULAR

## 2012-08-24 NOTE — ED Provider Notes (Signed)
Chief Complaint:   Chief Complaint  Patient presents with  . Wound Check    History of Present Illness:    [name] is a 27 year old female who returns today for a scheduled followup on abscess in her left groin. This is still very painful. The redness and swelling has gone down a little bit. It's not draining any pus. She denies any fever. She's been taking the antibiotics and feels a little bit nauseated from them. Her pain is controlled with pain medication.  Review of Systems:  Other than noted above, the patient denies any of the following symptoms: Systemic:  No fever, chills or sweats. Skin:  No rash or itching.  PMFSH:  Past medical history, family history, social history, meds, and allergies were reviewed.  No history of diabetes or prior history of abscesses or MRSA.  Physical Exam:   Vital signs:  BP 105/66  Pulse 86  Temp(Src) 98.1 F (36.7 C) (Oral)  Resp 16  SpO2 97%  LMP 08/06/2012 Skin:  The area of erythema has decreased by about a third and it was again outlined with a dermatographic pattern. The abscess in the groin now feels fluctuant it was not draining.  Skin exam was otherwise normal.  No rash. Ext:  Distal pulses were full, patient has full ROM of all joints.  Procedure:  Verbal informed consent was obtained.  The patient was informed of the risks and benefits of the procedure and understands and accepts.  Identity of the patient was verified verbally and by wristband.   She was premedicated with Dilaudid 2 mg IM and Zofran 4 mg IM and this did seem to help control the pain during the procedure. The abscess area described above was prepped with Betadine and alcohol and anesthetized with 3 mL of 2% Xylocaine with epinephrine.  Using a #11 scalpel blade, a singe straight incision was made into the area of fluctulence, yielding a large amount of prurulent drainage.  Routine cultures were obtained.  Blunt dissection was used to break up loculations and the  resulting wound cavity was packed with 1/4 inch Iodoform gauze.  A sterile pressure dressing was applied.  Course in Urgent Care Center:   She was given Rocephin 1 g IM.  Assessment:  The encounter diagnosis was Abscess.  She appears to be slowly improving and with the opening up of the abscess cavity today, improvement should continue. We will plan to see her again in 48 hours.  Plan:   1.  The following meds were prescribed:   New Prescriptions   No medications on file   2.  The patient was instructed in symptomatic care and handouts were given. 3.  The patient was instructed to leave the dressing in place and return again in 48 hours for packing removal.  Given red flag symptoms such as high fever, and increasing erythema or pain that would indicate earlier return.   Reuben Likes, MD 08/24/12 [phone]

## 2012-08-24 NOTE — ED Notes (Signed)
Pt  Here  For a  Recheck  Of  The  Wound to  abd  seen  ucc   sev  Days  Ago here  For  Re  Check  Area          The     Area  Is             Tender  To  Touch

## 2012-08-25 LAB — CULTURE, ROUTINE-ABSCESS

## 2012-08-26 ENCOUNTER — Emergency Department (INDEPENDENT_AMBULATORY_CARE_PROVIDER_SITE_OTHER)
Admission: EM | Admit: 2012-08-26 | Discharge: 2012-08-26 | Disposition: A | Discharge status: Home / Self Care | Payer: Self-pay | Source: Home / Self Care | Attending: Emergency Medicine | Admitting: Emergency Medicine

## 2012-08-26 ENCOUNTER — Encounter (HOSPITAL_COMMUNITY): Payer: Self-pay | Admitting: Emergency Medicine

## 2012-08-26 NOTE — ED Provider Notes (Signed)
Chief Complaint:   Chief Complaint  Patient presents with  . Wound Check    left lower abdomen abcess follow up    History of Present Illness:    [name] is a 27 year old female who is here today for a planned followup on abscess in her left groin. She has been taking cephalexin and Septra. The pain and swelling are markedly better. There's been minimal drainage and no fever or chills. Her cultures back growing out non-MRSA staph which is sensitive to almost all antibodies tested except for Levaquin and Cipro. The packing has come out on its own.  Review of Systems:  Other than noted above, the patient denies any of the following symptoms: Systemic:  No fever, chills or sweats. Skin:  No rash or itching.  PMFSH:  Past medical history, family history, social history, meds, and allergies were reviewed.  No history of diabetes or prior history of abscesses or MRSA.  Physical Exam:   Vital signs:  LMP 08/06/2012 Skin:  The abscess looks markedly better. There is no erythema, minimal tenderness to palpation, the packing has come out on its own, and there is no drainage.  Skin exam was otherwise normal.  No rash. Ext:  Distal pulses were full, patient has full ROM of all joints.  Assessment:  The encounter diagnosis was Abscess.  Seems to be healing up well now after I&D.  Plan:   1.  The following meds were prescribed:   Discharge Medication List as of 08/26/2012 12:20 PM     2.  The patient was instructed in symptomatic care and handouts were given. 3.  The patient was instructed to leave the dressing in place and return again in 48 hours for packing removal.  Given red flag symptoms such as recurrent abscesses, fever, or increase in drainage that would indicate earlier return.   Reuben Likes, MD 08/26/12 2019

## 2012-08-26 NOTE — ED Notes (Signed)
Pt is here for follow up of abscess of the left lower abdomen. Pt states that it is still very sore and tender.  Pt is taking antibiotics as prescribed and using warm compresses. Wound is still having a light drainage.  No sign of infection spreading.

## 2012-08-27 NOTE — ED Notes (Signed)
Abscess culture L groin: few Staph. Aureus.  Pt. adequately treated with I and D,  Rocephin 1 gm IM, Keflex and Bactrim DS.  Pt. improving on recheck 3/15. Vassie Moselle 08/27/2012

## 2013-01-10 ENCOUNTER — Encounter (HOSPITAL_COMMUNITY): Payer: Self-pay | Admitting: *Deleted

## 2013-01-10 ENCOUNTER — Emergency Department (HOSPITAL_COMMUNITY)
Admission: EM | Admit: 2013-01-10 | Discharge: 2013-01-11 | Disposition: A | Discharge status: Home / Self Care | Payer: Self-pay | Attending: Emergency Medicine | Admitting: Emergency Medicine

## 2013-01-10 DIAGNOSIS — Z87442 Personal history of urinary calculi: Secondary | ICD-10-CM | POA: Insufficient documentation

## 2013-01-10 DIAGNOSIS — F411 Generalized anxiety disorder: Secondary | ICD-10-CM | POA: Insufficient documentation

## 2013-01-10 DIAGNOSIS — R110 Nausea: Secondary | ICD-10-CM | POA: Insufficient documentation

## 2013-01-10 DIAGNOSIS — Z862 Personal history of diseases of the blood and blood-forming organs and certain disorders involving the immune mechanism: Secondary | ICD-10-CM | POA: Insufficient documentation

## 2013-01-10 DIAGNOSIS — Z8639 Personal history of other endocrine, nutritional and metabolic disease: Secondary | ICD-10-CM | POA: Insufficient documentation

## 2013-01-10 DIAGNOSIS — R51 Headache: Secondary | ICD-10-CM | POA: Insufficient documentation

## 2013-01-10 DIAGNOSIS — R519 Headache, unspecified: Secondary | ICD-10-CM

## 2013-01-10 DIAGNOSIS — Z8659 Personal history of other mental and behavioral disorders: Secondary | ICD-10-CM | POA: Insufficient documentation

## 2013-01-10 DIAGNOSIS — F17200 Nicotine dependence, unspecified, uncomplicated: Secondary | ICD-10-CM | POA: Insufficient documentation

## 2013-01-10 DIAGNOSIS — Z8719 Personal history of other diseases of the digestive system: Secondary | ICD-10-CM | POA: Insufficient documentation

## 2013-01-10 MED ORDER — SODIUM CHLORIDE 0.9 % IV BOLUS (SEPSIS)
1000.0000 mL | Freq: Once | INTRAVENOUS | Status: AC
  Administered 2013-01-10: 1000 mL via INTRAVENOUS

## 2013-01-10 MED ORDER — DEXAMETHASONE SODIUM PHOSPHATE 10 MG/ML IJ SOLN
10.0000 mg | Freq: Once | INTRAMUSCULAR | Status: AC
  Administered 2013-01-10: 10 mg via INTRAVENOUS
  Filled 2013-01-10: qty 1

## 2013-01-10 MED ORDER — DIPHENHYDRAMINE HCL 50 MG/ML IJ SOLN
25.0000 mg | Freq: Once | INTRAMUSCULAR | Status: AC
  Administered 2013-01-10: 25 mg via INTRAVENOUS
  Filled 2013-01-10: qty 1

## 2013-01-10 MED ORDER — OXYCODONE-ACETAMINOPHEN 5-325 MG PO TABS: 1.0000 | ORAL_TABLET | Freq: Four times a day (QID) | ORAL | Status: DC | PRN

## 2013-01-10 MED ORDER — ONDANSETRON 8 MG PO TBDP: 8.0000 mg | ORAL_TABLET | Freq: Three times a day (TID) | ORAL | Status: DC | PRN

## 2013-01-10 MED ORDER — METOCLOPRAMIDE HCL 5 MG/ML IJ SOLN
10.0000 mg | Freq: Once | INTRAMUSCULAR | Status: AC
  Administered 2013-01-10: 10 mg via INTRAVENOUS
  Filled 2013-01-10: qty 2

## 2013-01-10 NOTE — ED Notes (Signed)
Pt c/o extreme headache stating it is worse than any HA she has had in the past. No nuchal rigidity noted.

## 2013-01-10 NOTE — ED Notes (Signed)
C/o headache since this morning; worse than previous episodes of headaches; pain going down neck --- able to slowly turn head to shoulder and chest with c/o increased pain in top of head radiating down neck; no fever; nausea/vomiting today

## 2013-01-10 NOTE — ED Provider Notes (Signed)
CSN: [id number]     Arrival date & time 01/10/13  2048 History     First MD Initiated Contact with Patient 01/10/13 2104     Chief Complaint  Patient presents with  . Headache   (Consider location/radiation/quality/duration/timing/severity/associated sxs/prior Treatment) HPI  [name] is a 27 y.o.female with a significant PMH of GERD, anxiety, basedows disease, pyelonephritis, bipolar presents to the ER with complaints of migraine headache. She is brought in by mom. Pt has a history of headaches and has anaprox, vistaril and ibuprofen at home none of which helped. She also had a Vicodin and Zofran but was unable to keep them down. Mom who is here says that she also gets really bad migraines as well. PT has worsening pain with light and noise. It hurts her head worse to talk and she feels nauseous. She denies change in vision or focal/generalzied weakness. No abdominal or chest pains, no diarrhea, fevers, neck pain or URI symptoms.   Past Medical History  Diagnosis Date  . Gastric reflux   . Anxiety   . Basedow's disease   . Pyelonephritis   . History of nephrolithiasis   . Anxiety disorder   . Bipolar disorder    Past Surgical History  Procedure Laterality Date  . Tonsillectomy    . Adenoidectomy    . Tear duct probing     Family History  Problem Relation Age of Onset  . Diabetes    . Hypertension    . Lung cancer    . Bipolar disorder Sister    History  Substance Use Topics  . Smoking status: Current Every Day Smoker -- 0.25 packs/day for 8 years    Types: Cigarettes  . Smokeless tobacco: Never Used  . Alcohol Use: No     Comment: Couple months since last use of alcohol states pt.  Pt reports that she would drink 1/2 of a fifth to take away emotional pain.    OB History   Grav Para Term Preterm Abortions TAB SAB Ect Mult Living                 Review of Systems ROS is negative unless otherwise stated in the HPI  Allergies  Review of patient's  allergies indicates no known allergies.  Home Medications   Current Outpatient Rx  Name  Route  Sig  Dispense  Refill  . ibuprofen (ADVIL,MOTRIN) 800 MG tablet   Oral   Take 800 mg by mouth every 8 (eight) hours as needed. Pain.         . naproxen sodium (ANAPROX) 220 MG tablet   Oral   Take 220 mg by mouth 2 (two) times daily with a meal. As needed for headache or pain         . hydrOXYzine (VISTARIL) 50 MG capsule   Oral   Take 50 mg by mouth 2 (two) times daily as needed for itching.          . ondansetron (ZOFRAN ODT) 8 MG disintegrating tablet   Oral   Take 1 tablet (8 mg total) by mouth every 8 (eight) hours as needed for nausea.   20 tablet   0   . oxyCODONE-acetaminophen (PERCOCET/ROXICET) 5-325 MG per tablet   Oral   Take 1 tablet by mouth every 6 (six) hours as needed for pain.   15 tablet   0    BP 105/67  Pulse 78  Temp(Src) 98 F (36.7 C)  Resp 20  SpO2  98%  LMP 01/03/2013 Physical Exam  Nursing note and vitals reviewed. Constitutional: She is oriented to person, place, and time. She appears well-developed and well-nourished. No distress.  HENT:  Head: Normocephalic and atraumatic.  Mouth/Throat: Uvula is midline and oropharynx is clear and moist.  Eyes: Conjunctivae, EOM and lids are normal. Pupils are equal, round, and reactive to light.  Neck: Normal range of motion. Neck supple.  Cardiovascular: Normal rate and regular rhythm.   Pulmonary/Chest: Effort normal. No respiratory distress.  Abdominal: Soft. She exhibits no distension. There is no tenderness.  Neurological: She is alert and oriented to person, place, and time. She has normal strength. No cranial nerve deficit or sensory deficit. GCS eye subscore is 4. GCS verbal subscore is 5. GCS motor subscore is 6.  Skin: Skin is warm and dry.    ED Course   Procedures (including critical care time)  Labs Reviewed - No data to display No results found. 1. Headache     MDM  Patient  given 1 L NSaline 25mg  IV Benadryl 10mg  IV Decadron 10 mg IV Reglan   Patient in room sleeping on re-evaluation. Mom says that she said she is feeling much better. I woke her up and her pain is 3/10. No longer nauseous. Oral challenge without any adverse events.  Will give Rx for percocet, zofran ODT and referral to headache clinic.  26 y.o.[name]'s evaluation in the Emergency Department is complete. It has been determined that no acute conditions requiring further emergency intervention are present at this time. The patient/guardian have been advised of the diagnosis and plan. We have discussed signs and symptoms that warrant return to the ED, such as changes or worsening in symptoms.  Vital signs are stable at discharge. Filed Vitals:   01/10/13 2101  BP: 105/67  Pulse: 78  Temp: 98 F (36.7 C)  Resp: 20    Patient/guardian has voiced understanding and agreed to follow-up with the PCP or specialist.             Dorthula Matas, PA-C 01/10/13 2350

## 2013-01-11 NOTE — ED Provider Notes (Signed)
Medical screening examination/treatment/procedure(s) were performed by non-physician practitioner and as supervising physician I was immediately available for consultation/collaboration.  Wladyslawa Disbro, MD 01/11/13 0001 

## 2013-01-28 ENCOUNTER — Encounter (HOSPITAL_COMMUNITY): Payer: Self-pay | Admitting: Emergency Medicine

## 2013-01-28 ENCOUNTER — Emergency Department (HOSPITAL_COMMUNITY)
Admission: EM | Admit: 2013-01-28 | Discharge: 2013-01-28 | Disposition: A | Discharge status: Home / Self Care | Payer: Medicaid Other | Attending: Emergency Medicine | Admitting: Emergency Medicine

## 2013-01-28 DIAGNOSIS — N390 Urinary tract infection, site not specified: Secondary | ICD-10-CM

## 2013-01-28 DIAGNOSIS — Z3490 Encounter for supervision of normal pregnancy, unspecified, unspecified trimester: Secondary | ICD-10-CM

## 2013-01-28 DIAGNOSIS — Z8639 Personal history of other endocrine, nutritional and metabolic disease: Secondary | ICD-10-CM | POA: Insufficient documentation

## 2013-01-28 DIAGNOSIS — R300 Dysuria: Secondary | ICD-10-CM | POA: Insufficient documentation

## 2013-01-28 DIAGNOSIS — O2390 Unspecified genitourinary tract infection in pregnancy, unspecified trimester: Secondary | ICD-10-CM | POA: Insufficient documentation

## 2013-01-28 DIAGNOSIS — Z8719 Personal history of other diseases of the digestive system: Secondary | ICD-10-CM | POA: Insufficient documentation

## 2013-01-28 DIAGNOSIS — K920 Hematemesis: Secondary | ICD-10-CM | POA: Insufficient documentation

## 2013-01-28 DIAGNOSIS — Z87442 Personal history of urinary calculi: Secondary | ICD-10-CM | POA: Insufficient documentation

## 2013-01-28 DIAGNOSIS — R1110 Vomiting, unspecified: Secondary | ICD-10-CM

## 2013-01-28 DIAGNOSIS — O210 Mild hyperemesis gravidarum: Secondary | ICD-10-CM | POA: Insufficient documentation

## 2013-01-28 DIAGNOSIS — Z862 Personal history of diseases of the blood and blood-forming organs and certain disorders involving the immune mechanism: Secondary | ICD-10-CM | POA: Insufficient documentation

## 2013-01-28 DIAGNOSIS — Z8659 Personal history of other mental and behavioral disorders: Secondary | ICD-10-CM | POA: Insufficient documentation

## 2013-01-28 DIAGNOSIS — Z79899 Other long term (current) drug therapy: Secondary | ICD-10-CM | POA: Insufficient documentation

## 2013-01-28 DIAGNOSIS — O99330 Smoking (tobacco) complicating pregnancy, unspecified trimester: Secondary | ICD-10-CM | POA: Insufficient documentation

## 2013-01-28 LAB — BASIC METABOLIC PANEL
BUN: 5 mg/dL — ABNORMAL LOW (ref 6–23)
Calcium: 9.4 mg/dL (ref 8.4–10.5)
Creatinine, Ser: 0.58 mg/dL (ref 0.50–1.10)
GFR calc Af Amer: >90 mL/min (ref >90)
GFR calc non Af Amer: >90 mL/min (ref >90)

## 2013-01-28 LAB — URINE MICROSCOPIC-ADD ON

## 2013-01-28 LAB — CBC WITH DIFFERENTIAL/PLATELET
Basophils Relative: 0 % (ref 0–1)
Eosinophils Absolute: 0.1 10*3/uL (ref 0.0–0.7)
HCT: 40.4 % (ref 36.0–46.0)
Hemoglobin: 13.4 g/dL (ref 12.0–15.0)
MCH: 27.9 pg (ref 26.0–34.0)
MCHC: 33.2 g/dL (ref 30.0–36.0)
Monocytes Absolute: 1.1 10*3/uL — ABNORMAL HIGH (ref 0.1–1.0)
Monocytes Relative: 9 % (ref 3–12)
RDW: 14.1 % (ref 11.5–15.5)

## 2013-01-28 LAB — URINALYSIS, ROUTINE W REFLEX MICROSCOPIC
Ketones, ur: >80 mg/dL — AB
Nitrite: POSITIVE — AB
Protein, ur: 30 mg/dL — AB
pH: 5 (ref 5.0–8.0)

## 2013-01-28 LAB — PREGNANCY, URINE: Preg Test, Ur: POSITIVE — AB

## 2013-01-28 MED ORDER — SODIUM CHLORIDE 0.9 % IV SOLN
INTRAVENOUS | Status: DC
  Administered 2013-01-28: 16:00:00 via INTRAVENOUS

## 2013-01-28 MED ORDER — PRENATAL VITAMINS 0.8 MG PO TABS: 1.0000 | ORAL_TABLET | Freq: Every day | ORAL | Status: DC

## 2013-01-28 MED ORDER — ONDANSETRON HCL 4 MG/2ML IJ SOLN
4.0000 mg | Freq: Once | INTRAMUSCULAR | Status: AC
  Administered 2013-01-28: 4 mg via INTRAVENOUS
  Filled 2013-01-28: qty 2

## 2013-01-28 MED ORDER — SODIUM CHLORIDE 0.9 % IV BOLUS (SEPSIS)
1000.0000 mL | Freq: Once | INTRAVENOUS | Status: AC
  Administered 2013-01-28: 1000 mL via INTRAVENOUS

## 2013-01-28 MED ORDER — PROMETHAZINE HCL 25 MG PO TABS: 25.0000 mg | ORAL_TABLET | Freq: Four times a day (QID) | ORAL | Status: DC | PRN

## 2013-01-28 MED ORDER — DEXTROSE 5 % IV SOLN
1.0000 g | Freq: Once | INTRAVENOUS | Status: AC
  Administered 2013-01-28: 1 g via INTRAVENOUS
  Filled 2013-01-28: qty 10

## 2013-01-28 MED ORDER — CEPHALEXIN 500 MG PO CAPS: 500.0000 mg | ORAL_CAPSULE | Freq: Four times a day (QID) | ORAL | Status: DC

## 2013-01-28 MED ORDER — ONDANSETRON 4 MG PO TBDP: 4.0000 mg | ORAL_TABLET | Freq: Three times a day (TID) | ORAL | Status: DC | PRN

## 2013-01-28 NOTE — ED Notes (Signed)
Pt alert, arrives from home, c/o nausea, emesis, recent + home pregnancy test, resp even unlabored, skin pwd, pt states "I took AZO so i could go to work, that didn't last at all?"

## 2013-01-28 NOTE — ED Provider Notes (Signed)
CSN: [id number]     Arrival date & time 01/28/13  1014 History     First MD Initiated Contact with Patient 01/28/13 1155     Chief Complaint  Patient presents with  . Emesis During Pregnancy    Recent Positive home pregnancy test   (Consider location/radiation/quality/duration/timing/severity/associated sxs/prior Treatment) The history is provided by the patient.   24 your old female. Last menstrual period was one month ago had a home pregnancy test that was positive one week ago. She is gravida 1 para 0. New OB/GYN doctor with Dr. Senaida Ores. Patient currently not taking a prenatal vitamins. Patient presents with 2 days of nausea and vomiting and dysuria. Patient had one episode of vomit that had a block of blood and none since. Past Medical History  Diagnosis Date  . Gastric reflux   . Anxiety   . Basedow's disease   . Pyelonephritis   . History of nephrolithiasis   . Anxiety disorder   . Bipolar disorder    Past Surgical History  Procedure Laterality Date  . Tonsillectomy    . Adenoidectomy    . Tear duct probing     Family History  Problem Relation Age of Onset  . Diabetes    . Hypertension    . Lung cancer    . Bipolar disorder Sister    History  Substance Use Topics  . Smoking status: Current Every Day Smoker -- 0.25 packs/day for 8 years    Types: Cigarettes  . Smokeless tobacco: Never Used  . Alcohol Use: No     Comment: Couple months since last use of alcohol states pt.  Pt reports that she would drink 1/2 of a fifth to take away emotional pain.    OB History   Grav Para Term Preterm Abortions TAB SAB Ect Mult Living                 Review of Systems  Constitutional: Negative for fever.  HENT: Negative for congestion.   Eyes: Negative for redness.  Respiratory: Negative for shortness of breath.   Cardiovascular: Negative for chest pain.  Gastrointestinal: Positive for nausea and vomiting. Negative for abdominal pain.  Genitourinary: Positive for  dysuria. Negative for vaginal bleeding and vaginal discharge.  Musculoskeletal: Negative for back pain.  Skin: Negative for rash.  Neurological: Negative for headaches.  Hematological: Does not bruise/bleed easily.  Psychiatric/Behavioral: Negative for confusion.    Allergies  Review of patient's allergies indicates no known allergies.  Home Medications   Current Outpatient Rx  Name  Route  Sig  Dispense  Refill  . ibuprofen (ADVIL,MOTRIN) 800 MG tablet   Oral   Take 800 mg by mouth every 8 (eight) hours as needed. Pain.         . ondansetron (ZOFRAN ODT) 8 MG disintegrating tablet   Oral   Take 1 tablet (8 mg total) by mouth every 8 (eight) hours as needed for nausea.   20 tablet   0   . oxyCODONE-acetaminophen (PERCOCET/ROXICET) 5-325 MG per tablet   Oral   Take 1 tablet by mouth every 6 (six) hours as needed for pain.   15 tablet   0   . cephALEXin (KEFLEX) 500 MG capsule   Oral   Take 1 capsule (500 mg total) by mouth 4 (four) times daily.   28 capsule   0   . ondansetron (ZOFRAN ODT) 4 MG disintegrating tablet   Oral   Take 1 tablet (4 mg total) by  mouth every 8 (eight) hours as needed for nausea.   10 tablet   0   . Prenatal Multivit-Min-Fe-FA (PRENATAL VITAMINS) 0.8 MG tablet   Oral   Take 1 tablet by mouth daily.   30 tablet   2   . EXPIRED: promethazine (PHENERGAN) 25 MG tablet   Oral   Take 1 tablet (25 mg total) by mouth every 6 (six) hours as needed for nausea.   20 tablet   0   . EXPIRED: promethazine (PHENERGAN) 25 MG tablet   Oral   Take 1 tablet (25 mg total) by mouth every 6 (six) hours as needed for nausea.   10 tablet   0   . promethazine (PHENERGAN) 25 MG tablet   Oral   Take 1 tablet (25 mg total) by mouth every 6 (six) hours as needed for nausea.   20 tablet   0    BP 123/83  Pulse 89  Temp(Src) 98 F (36.7 C) (Oral)  Resp 16  Wt 180 lb (81.647 kg)  BMI 29.95 kg/m2  SpO2 99%  LMP 01/03/2013 Physical Exam  Nursing  note and vitals reviewed. Constitutional: She is oriented to person, place, and time. She appears well-developed and well-nourished. No distress.  HENT:  Head: Normocephalic and atraumatic.  Mouth/Throat: Oropharynx is clear and moist.  Eyes: Conjunctivae and EOM are normal. Pupils are equal, round, and reactive to light.  Neck: Normal range of motion. Neck supple.  Cardiovascular: Normal rate, regular rhythm, normal heart sounds and intact distal pulses.   No murmur heard. Pulmonary/Chest: Effort normal and breath sounds normal. No respiratory distress.  Abdominal: Soft. Bowel sounds are normal. There is no tenderness.  Musculoskeletal: Normal range of motion. She exhibits no edema.  Neurological: She is alert and oriented to person, place, and time. No cranial nerve deficit. She exhibits normal muscle tone. Coordination normal.  Skin: Skin is warm. No rash noted.    ED Course   Procedures (including critical care time)  Labs Reviewed  PREGNANCY, URINE - Abnormal; Notable for the following:    Preg Test, Ur POSITIVE (*)    All other components within normal limits  URINALYSIS, ROUTINE W REFLEX MICROSCOPIC - Abnormal; Notable for the following:    Color, Urine RED (*)    APPearance CLOUDY (*)    Specific Gravity, Urine 1.033 (*)    Hgb urine dipstick TRACE (*)    Bilirubin Urine MODERATE (*)    Ketones, ur >80 (*)    Protein, ur 30 (*)    Urobilinogen, UA 4.0 (*)    Nitrite POSITIVE (*)    Leukocytes, UA LARGE (*)    All other components within normal limits  URINE MICROSCOPIC-ADD ON - Abnormal; Notable for the following:    Squamous Epithelial / LPF FEW (*)    Bacteria, UA MANY (*)    All other components within normal limits  CBC WITH DIFFERENTIAL - Abnormal; Notable for the following:    WBC 12.1 (*)    Neutro Abs 8.2 (*)    Monocytes Absolute 1.1 (*)    All other components within normal limits  BASIC METABOLIC PANEL - Abnormal; Notable for the following:     Potassium 3.4 (*)    BUN 5 (*)    All other components within normal limits  URINE CULTURE   Results for orders placed during the hospital encounter of 01/28/13  PREGNANCY, URINE      Result Value Range   Preg Test, Ur POSITIVE (*)  NEGATIVE  URINALYSIS, ROUTINE W REFLEX MICROSCOPIC      Result Value Range   Color, Urine RED (*) YELLOW   APPearance CLOUDY (*) CLEAR   Specific Gravity, Urine 1.033 (*) 1.005 - 1.030   pH 5.0  5.0 - 8.0   Glucose, UA NEGATIVE  NEGATIVE mg/dL   Hgb urine dipstick TRACE (*) NEGATIVE   Bilirubin Urine MODERATE (*) NEGATIVE   Ketones, ur >80 (*) NEGATIVE mg/dL   Protein, ur 30 (*) NEGATIVE mg/dL   Urobilinogen, UA 4.0 (*) 0.0 - 1.0 mg/dL   Nitrite POSITIVE (*) NEGATIVE   Leukocytes, UA LARGE (*) NEGATIVE  URINE MICROSCOPIC-ADD ON      Result Value Range   Squamous Epithelial / LPF FEW (*) RARE   WBC, UA 11-20  <3 WBC/hpf   RBC / HPF 0-2  <3 RBC/hpf   Bacteria, UA MANY (*) RARE  CBC WITH DIFFERENTIAL      Result Value Range   WBC 12.1 (*) 4.0 - 10.5 K/uL   RBC 4.81  3.87 - 5.11 MIL/uL   Hemoglobin 13.4  12.0 - 15.0 g/dL   HCT 09.8  11.9 - 14.7 %   MCV 84.0  78.0 - 100.0 fL   MCH 27.9  26.0 - 34.0 pg   MCHC 33.2  30.0 - 36.0 g/dL   RDW 82.9  56.2 - 13.0 %   Platelets 274  150 - 400 K/uL   Neutrophils Relative % 68  43 - 77 %   Neutro Abs 8.2 (*) 1.7 - 7.7 K/uL   Lymphocytes Relative 22  12 - 46 %   Lymphs Abs 2.6  0.7 - 4.0 K/uL   Monocytes Relative 9  3 - 12 %   Monocytes Absolute 1.1 (*) 0.1 - 1.0 K/uL   Eosinophils Relative 1  0 - 5 %   Eosinophils Absolute 0.1  0.0 - 0.7 K/uL   Basophils Relative 0  0 - 1 %   Basophils Absolute 0.0  0.0 - 0.1 K/uL  BASIC METABOLIC PANEL      Result Value Range   Sodium 136  135 - 145 mEq/L   Potassium 3.4 (*) 3.5 - 5.1 mEq/L   Chloride 101  96 - 112 mEq/L   CO2 24  19 - 32 mEq/L   Glucose, Bld 94  70 - 99 mg/dL   BUN 5 (*) 6 - 23 mg/dL   Creatinine, Ser 8.65  0.50 - 1.10 mg/dL   Calcium 9.4  8.4 -  78.4 mg/dL   GFR calc non Af Amer >90  >90 mL/min   GFR calc Af Amer >90  >90 mL/min      No results found. 1. Pregnancy   2. UTI (lower urinary tract infection)   3. Vomiting     MDM  Patient with positive see test at home confirmed here. Last menstrual period was one month ago technically early pregnancy. First pregnancy. Patient without any vaginal bleeding or sniffing abdominal pain. She did have symptoms dysuria UA is consistent with urinary tract infection. Patient treated with 1 g Rocephin here will continue her Keflex at home. Possible that there could be a component of some mild pyelonephritis.  Or the vomiting could be related to her pregnancy. Patient treated with IV fluids anti-medics feeling much better. Patient will be started on prenatal vitamins and referral to OB/GYN.  Shelda Jakes, MD 01/28/13 [phone]

## 2013-01-28 NOTE — ED Notes (Signed)
Pt from home c/o nausea,dehydration, L flank pain, dysuria, and urinary frequency. Pt states that she took a home pregnancy test and results were positive. LMP July 23rd. Pt has hx of interstitial cystitis and UTI. Pt adds that she took AZO this am so she could "try to make it through work." Pt is A&O and in NAD

## 2013-01-30 LAB — URINE CULTURE

## 2013-01-31 ENCOUNTER — Telehealth (HOSPITAL_COMMUNITY): Payer: Self-pay | Admitting: *Deleted

## 2013-01-31 NOTE — ED Notes (Signed)
Post ED Visit - Positive Culture Follow-up: Successful Patient Follow-Up  Culture assessed and recommendations reviewed by: []  Wes Dulaney, Pharm.D., BCPS [x]  Celedonio Miyamoto, [address].D., BCPS []  Georgina Pillion, Pharm.D., BCPS []  [place], [address].D., BCPS, AAHIVP []  Estella Husk, Pharm.D., BCPS, AAHIVP  Positive urine culture  []  Patient discharged without antimicrobial prescription and treatment is now indicated [x]  Organism is resistant to prescribed ED discharge antimicrobial []  Patient with positive blood cultures  Changes discussed with ED provider: Sharilyn Sites New antibiotic prescription stop Keflex start Macrobid 100 mg po BID x 7 days    Larena Sox 01/31/2013, 1:10 PM

## 2013-01-31 NOTE — Progress Notes (Signed)
ED Antimicrobial Stewardship Positive Culture Follow Up   [name] is an 27 y.o. female who presented to [facility] on 01/28/2013 with a chief complaint of  Chief Complaint  Patient presents with  . Emesis During Pregnancy    Recent Positive home pregnancy test    Recent Results (from the past 720 hour(s))  URINE CULTURE     Status: None   Collection Time    01/28/13 11:39 AM      Result Value Range Status   Specimen Description URINE, CLEAN CATCH   Final   Special Requests NONE   Final   Culture  Setup Time 01/28/2013 17:36   Final   Colony Count >=100,000 COLONIES/ML   Final   Culture     Final   Value: ESCHERICHIA COLI     Note: Confirmed Extended Spectrum Beta-Lactamase Producer (ESBL)   Report Status 01/30/2013 FINAL   Final   Organism ID, Bacteria ESCHERICHIA COLI   Final    [x]  Treated with cephalexin, organism resistant to prescribed antimicrobial []  Patient discharged originally without antimicrobial agent and treatment is now indicated  New antibiotic prescription: Macrobid 100mg  po BID x 7 days  ED Provider: Sharilyn Sites, PAC   Mickeal Skinner 01/31/2013, 10:05 AM Infectious Diseases Pharmacist Phone# [phone]

## 2013-02-01 NOTE — ED Notes (Signed)
RX called to [facility] [phone] by Alexia Freestone PFM

## 2013-02-07 ENCOUNTER — Inpatient Hospital Stay (HOSPITAL_COMMUNITY)
Admission: AD | Admit: 2013-02-07 | Discharge: 2013-02-07 | Disposition: A | Discharge status: Home / Self Care | Payer: Medicaid Other | Source: Ambulatory Visit | Attending: Family Medicine | Admitting: Family Medicine

## 2013-02-07 ENCOUNTER — Inpatient Hospital Stay (HOSPITAL_COMMUNITY): Payer: Medicaid Other

## 2013-02-07 ENCOUNTER — Encounter (HOSPITAL_COMMUNITY): Payer: Self-pay | Admitting: *Deleted

## 2013-02-07 DIAGNOSIS — O2390 Unspecified genitourinary tract infection in pregnancy, unspecified trimester: Secondary | ICD-10-CM | POA: Insufficient documentation

## 2013-02-07 DIAGNOSIS — M549 Dorsalgia, unspecified: Secondary | ICD-10-CM | POA: Insufficient documentation

## 2013-02-07 DIAGNOSIS — O3411 Maternal care for benign tumor of corpus uteri, first trimester: Secondary | ICD-10-CM

## 2013-02-07 DIAGNOSIS — N390 Urinary tract infection, site not specified: Secondary | ICD-10-CM | POA: Insufficient documentation

## 2013-02-07 DIAGNOSIS — O99891 Other specified diseases and conditions complicating pregnancy: Secondary | ICD-10-CM | POA: Insufficient documentation

## 2013-02-07 DIAGNOSIS — O3410 Maternal care for benign tumor of corpus uteri, unspecified trimester: Secondary | ICD-10-CM

## 2013-02-07 DIAGNOSIS — R109 Unspecified abdominal pain: Secondary | ICD-10-CM | POA: Insufficient documentation

## 2013-02-07 HISTORY — DX: Interstitial cystitis (chronic) without hematuria: N30.10

## 2013-02-07 HISTORY — DX: Other chronic pain: G89.29

## 2013-02-07 HISTORY — DX: Benign neoplasm of connective and other soft tissue, unspecified: D21.9

## 2013-02-07 HISTORY — DX: Dorsalgia, unspecified: M54.9

## 2013-02-07 LAB — URINALYSIS, ROUTINE W REFLEX MICROSCOPIC
Bilirubin Urine: NEGATIVE
Ketones, ur: NEGATIVE mg/dL
Nitrite: NEGATIVE
Protein, ur: NEGATIVE mg/dL
Urobilinogen, UA: 0.2 mg/dL (ref 0.0–1.0)

## 2013-02-07 LAB — WET PREP, GENITAL: Yeast Wet Prep HPF POC: NONE SEEN

## 2013-02-07 LAB — CBC WITH DIFFERENTIAL/PLATELET
Eosinophils Absolute: 0.2 10*3/uL (ref 0.0–0.7)
HCT: 36.6 % (ref 36.0–46.0)
Hemoglobin: 12.1 g/dL (ref 12.0–15.0)
Lymphs Abs: 2.3 10*3/uL (ref 0.7–4.0)
MCH: 27.4 pg (ref 26.0–34.0)
Monocytes Relative: 10 % (ref 3–12)
Neutrophils Relative %: 62 % (ref 43–77)
RBC: 4.41 MIL/uL (ref 3.87–5.11)

## 2013-02-07 LAB — ABO/RH: ABO/RH(D): O POS

## 2013-02-07 LAB — URINE MICROSCOPIC-ADD ON

## 2013-02-07 MED ORDER — OXYCODONE-ACETAMINOPHEN 5-325 MG PO TABS
1.0000 | ORAL_TABLET | Freq: Once | ORAL | Status: AC
  Administered 2013-02-07: 1 via ORAL
  Filled 2013-02-07: qty 1

## 2013-02-07 MED ORDER — OXYCODONE-ACETAMINOPHEN 5-325 MG PO TABS: 1.0000 | ORAL_TABLET | Freq: Three times a day (TID) | ORAL | Status: DC | PRN

## 2013-02-07 NOTE — MAU Note (Addendum)
Being treated for UTI. C/O back pain X 1 wk and getting worse. Still has difficulty voiding, feels like she can't pee, then feels like she is not empty and has to sit down again.

## 2013-02-07 NOTE — MAU Provider Note (Signed)
History     CSN: [id number]  Arrival date and time: 02/07/13 1029   First Provider Initiated Contact with Patient 02/07/13 1142      Chief Complaint  Patient presents with  . Back Pain   HPI Comments: [name] 27 y.o. G1P0 is in MAU today for back pain. She was treated for UTI with Keflex and rocephin in ER a week ago. She was later called and told she needed to change that to [facility]. She has taken most of the antibiotic and now she is having  back and low pelvic pain. Denies any bleeding. Plan her prenatal with Dr Herbie Baltimore but has not seen her in years.      Patient is a 27 y.o. female presenting with back pain.  Back Pain  Associated symptoms include abdominal pain.      Past Medical History  Diagnosis Date  . Gastric reflux   . Anxiety   . Basedow's disease   . Pyelonephritis   . History of nephrolithiasis   . Anxiety disorder   . Bipolar disorder   . Interstitial cystitis   . Chronic back pain     Past Surgical History  Procedure Laterality Date  . Tonsillectomy    . Adenoidectomy    . Tear duct probing      Family History  Problem Relation Age of Onset  . Diabetes    . Hypertension    . Lung cancer    . Bipolar disorder Sister     History  Substance Use Topics  . Smoking status: Current Every Day Smoker -- 0.25 packs/day for 8 years    Types: Cigarettes  . Smokeless tobacco: Never Used  . Alcohol Use: No     Comment: Couple months since last use of alcohol states pt.  Pt reports that she would drink 1/2 of a fifth to take away emotional pain.     Allergies: No Known Allergies  Prescriptions prior to admission  Medication Sig Dispense Refill  . nitrofurantoin, macrocrystal-monohydrate, (MACROBID) 100 MG capsule Take 100 mg by mouth 2 (two) times daily.      . Prenatal Vit-Fe Fumarate-FA (PRENATAL MULTIVITAMIN) TABS tablet Take 1 tablet by mouth daily at 12 noon.      . promethazine (PHENERGAN) 25 MG tablet Take 1 tablet (25 mg  total) by mouth every 6 (six) hours as needed for nausea.  20 tablet  0  . ondansetron (ZOFRAN ODT) 4 MG disintegrating tablet Take 1 tablet (4 mg total) by mouth every 8 (eight) hours as needed for nausea.  10 tablet  0  . ondansetron (ZOFRAN ODT) 8 MG disintegrating tablet Take 1 tablet (8 mg total) by mouth every 8 (eight) hours as needed for nausea.  20 tablet  0    Review of Systems  Constitutional: Negative.   HENT: Negative.   Eyes: Negative.   Respiratory: Negative.   Cardiovascular: Negative.   Gastrointestinal: Positive for abdominal pain.  Genitourinary: Positive for urgency and frequency.  Musculoskeletal: Positive for back pain.  Skin: Negative.   Neurological: Negative.   Psychiatric/Behavioral: Negative.    Physical Exam   Blood pressure 110/77, pulse 107, temperature 98.1 F (36.7 C), temperature source Oral, resp. rate 16, height 5\' 5"  (1.651 m), weight 181 lb (82.101 kg), last menstrual period 01/03/2013. Results for orders placed during the hospital encounter of 02/07/13 (from the past 24 hour(s))  URINALYSIS, ROUTINE W REFLEX MICROSCOPIC     Status: Abnormal   Collection Time  02/07/13 10:45 AM      Result Value Range   Color, Urine YELLOW  YELLOW   APPearance CLEAR  CLEAR   Specific Gravity, Urine 1.010  1.005 - 1.030   pH 7.5  5.0 - 8.0   Glucose, UA NEGATIVE  NEGATIVE mg/dL   Hgb urine dipstick TRACE (*) NEGATIVE   Bilirubin Urine NEGATIVE  NEGATIVE   Ketones, ur NEGATIVE  NEGATIVE mg/dL   Protein, ur NEGATIVE  NEGATIVE mg/dL   Urobilinogen, UA 0.2  0.0 - 1.0 mg/dL   Nitrite NEGATIVE  NEGATIVE   Leukocytes, UA NEGATIVE  NEGATIVE  URINE MICROSCOPIC-ADD ON     Status: Abnormal   Collection Time    02/07/13 10:45 AM      Result Value Range   Squamous Epithelial / LPF FEW (*) RARE   WBC, UA 0-2  <3 WBC/hpf   RBC / HPF 0-2  <3 RBC/hpf   Bacteria, UA RARE  RARE  CBC WITH DIFFERENTIAL     Status: None   Collection Time    02/07/13 11:57 AM       Result Value Range   WBC 9.1  4.0 - 10.5 K/uL   RBC 4.41  3.87 - 5.11 MIL/uL   Hemoglobin 12.1  12.0 - 15.0 g/dL   HCT 45.4  09.8 - 11.9 %   MCV 83.0  78.0 - 100.0 fL   MCH 27.4  26.0 - 34.0 pg   MCHC 33.1  30.0 - 36.0 g/dL   RDW 14.7  82.9 - 56.2 %   Platelets 203  150 - 400 K/uL   Neutrophils Relative % 62  43 - 77 %   Neutro Abs 5.6  1.7 - 7.7 K/uL   Lymphocytes Relative 26  12 - 46 %   Lymphs Abs 2.3  0.7 - 4.0 K/uL   Monocytes Relative 10  3 - 12 %   Monocytes Absolute 0.9  0.1 - 1.0 K/uL   Eosinophils Relative 3  0 - 5 %   Eosinophils Absolute 0.2  0.0 - 0.7 K/uL   Basophils Relative 0  0 - 1 %   Basophils Absolute 0.0  0.0 - 0.1 K/uL  HCG, QUANTITATIVE, PREGNANCY     Status: Abnormal   Collection Time    02/07/13 11:59 AM      Result Value Range   hCG, Beta Chain, Quant, [place] [address] (*) <5 mIU/mL  ABO/RH     Status: None   Collection Time    02/07/13 11:59 AM      Result Value Range   ABO/RH(D) O POS      Physical Exam  Constitutional: She is oriented to person, place, and time. She appears well-developed and well-nourished. She appears distressed.  Moderately distressed  HENT:  Head: Normocephalic and atraumatic.  Eyes: Pupils are equal, round, and reactive to light.  Cardiovascular: Normal rate and regular rhythm.   Respiratory: Effort normal and breath sounds normal.  GI: Soft. There is tenderness. There is no rebound and no guarding.  Low pelvic  Genitourinary: Vagina normal. No vaginal discharge found.  Musculoskeletal: Normal range of motion.  Neurological: She is alert and oriented to person, place, and time.  Skin: Skin is warm.  Psychiatric: She has a normal mood and affect.    MAU Course  Procedures  MDM CBC, U/S, Beta HCG, ABORH, wet prep and GC / chlamydia  Assessment and Plan  A: Abdominal/ Back pain in pregnancy/ likely related to fibroid P: Advised  to get prenatal care asap Discussed comfort measures Percocet one po prn q8 hours # 10/  NR  Carolynn Serve 02/07/2013, 11:55 AM

## 2013-02-07 NOTE — MAU Provider Note (Signed)
Chart reviewed and agree with management and plan.  

## 2013-02-17 ENCOUNTER — Inpatient Hospital Stay (HOSPITAL_COMMUNITY)
Admission: AD | Admit: 2013-02-17 | Discharge: 2013-02-17 | Disposition: A | Discharge status: Home / Self Care | Payer: Medicaid Other | Source: Ambulatory Visit | Attending: Family Medicine | Admitting: Family Medicine

## 2013-02-17 ENCOUNTER — Encounter (HOSPITAL_COMMUNITY): Payer: Self-pay | Admitting: *Deleted

## 2013-02-17 DIAGNOSIS — N3010 Interstitial cystitis (chronic) without hematuria: Secondary | ICD-10-CM

## 2013-02-17 DIAGNOSIS — O99891 Other specified diseases and conditions complicating pregnancy: Secondary | ICD-10-CM | POA: Insufficient documentation

## 2013-02-17 DIAGNOSIS — O210 Mild hyperemesis gravidarum: Secondary | ICD-10-CM | POA: Insufficient documentation

## 2013-02-17 DIAGNOSIS — M549 Dorsalgia, unspecified: Secondary | ICD-10-CM | POA: Insufficient documentation

## 2013-02-17 DIAGNOSIS — R300 Dysuria: Secondary | ICD-10-CM | POA: Insufficient documentation

## 2013-02-17 DIAGNOSIS — R112 Nausea with vomiting, unspecified: Secondary | ICD-10-CM

## 2013-02-17 LAB — URINALYSIS, ROUTINE W REFLEX MICROSCOPIC
Bilirubin Urine: NEGATIVE
Ketones, ur: NEGATIVE mg/dL
Nitrite: NEGATIVE
Protein, ur: NEGATIVE mg/dL
pH: >9 — ABNORMAL HIGH (ref 5.0–8.0)

## 2013-02-17 LAB — URINE MICROSCOPIC-ADD ON

## 2013-02-17 MED ORDER — PHENAZOPYRIDINE HCL 200 MG PO TABS: 200.0000 mg | ORAL_TABLET | Freq: Three times a day (TID) | ORAL | Status: DC

## 2013-02-17 MED ORDER — ONDANSETRON 8 MG PO TBDP
8.0000 mg | ORAL_TABLET | Freq: Once | ORAL | Status: AC
  Administered 2013-02-17: 8 mg via ORAL
  Filled 2013-02-17: qty 1

## 2013-02-17 MED ORDER — DOXYLAMINE-PYRIDOXINE 10-10 MG PO TBEC: 2.0000 | DELAYED_RELEASE_TABLET | Freq: Every day | ORAL | Status: DC

## 2013-02-17 MED ORDER — ONDANSETRON 8 MG PO TBDP: 8.0000 mg | ORAL_TABLET | Freq: Three times a day (TID) | ORAL | Status: DC | PRN

## 2013-02-17 MED ORDER — PHENAZOPYRIDINE HCL 100 MG PO TABS
200.0000 mg | ORAL_TABLET | Freq: Once | ORAL | Status: AC
  Administered 2013-02-17: 200 mg via ORAL
  Filled 2013-02-17: qty 2

## 2013-02-17 MED ORDER — PROMETHAZINE HCL 25 MG PO TABS: 25.0000 mg | ORAL_TABLET | Freq: Four times a day (QID) | ORAL | Status: DC | PRN

## 2013-02-17 NOTE — MAU Note (Signed)
Pt reports she has been treated for a bladder infection (WLED) for 3 weeks now. C/O back pain now and not feeling well. Has history of Kidney infections in the past.

## 2013-02-17 NOTE — MAU Provider Note (Signed)
History     CSN: [id number]  Arrival date and time: 02/17/13 1004   None     Chief Complaint  Patient presents with  . Back Pain  . Urinary Tract Infection   HPI pt is G1P0 105w3d pregnant and presents with UTI symptoms.  Pt was treated for UTI- resistant to Keflex and changed to Macrobid, which pt has completed. Pt states her sx improved but did not completely go away.  Pt c/o of burning with urination and incomplete emptying of bladder. Pt has previously seen Dr. McDiarmid, urology, and has a hx of IC as well as frequent UTIs.  Pt gets improvement with AZO but throws up med. Pt has had nausea and vomiting.  Pt took Zofran this morning, but threw back up.  Pt plans prenatal care with Dr. Senaida Ores- pt just got Medicaid card. Pt denies spotting or bleeding or cramping with pregnancy.  RN note:   MAU Note Service date: 02/17/2013 10:12 AM   Pt reports she has been treated for a bladder infection (WLED) for 3 weeks now. C/O back pain now and not feeling well. Has history of Kidney infections in the past.       Past Medical History  Diagnosis Date  . Gastric reflux   . Anxiety   . Basedow's disease   . Pyelonephritis   . History of nephrolithiasis   . Anxiety disorder   . Bipolar disorder   . Interstitial cystitis   . Chronic back pain   . Fibroid     Past Surgical History  Procedure Laterality Date  . Tonsillectomy    . Adenoidectomy    . Tear duct probing      Family History  Problem Relation Age of Onset  . Diabetes    . Hypertension    . Lung cancer    . Bipolar disorder Sister     History  Substance Use Topics  . Smoking status: Current Every Day Smoker -- 0.25 packs/day for 8 years    Types: Cigarettes  . Smokeless tobacco: Never Used  . Alcohol Use: No     Comment: Couple months since last use of alcohol states pt.  Pt reports that she would drink 1/2 of a fifth to take away emotional pain.     Allergies: No Known Allergies  Prescriptions prior  to admission  Medication Sig Dispense Refill  . nitrofurantoin, macrocrystal-monohydrate, (MACROBID) 100 MG capsule Take 100 mg by mouth 2 (two) times daily.      . ondansetron (ZOFRAN ODT) 4 MG disintegrating tablet Take 1 tablet (4 mg total) by mouth every 8 (eight) hours as needed for nausea.  10 tablet  0  . ondansetron (ZOFRAN ODT) 8 MG disintegrating tablet Take 1 tablet (8 mg total) by mouth every 8 (eight) hours as needed for nausea.  20 tablet  0  . oxyCODONE-acetaminophen (PERCOCET/ROXICET) 5-325 MG per tablet Take 1 tablet by mouth every 8 (eight) hours as needed for pain.  10 tablet  0  . Prenatal Vit-Fe Fumarate-FA (PRENATAL MULTIVITAMIN) TABS tablet Take 1 tablet by mouth daily at 12 noon.      . promethazine (PHENERGAN) 25 MG tablet Take 1 tablet (25 mg total) by mouth every 6 (six) hours as needed for nausea.  20 tablet  0    Review of Systems  Constitutional: Negative for fever and chills.  Gastrointestinal: Positive for nausea and vomiting. Negative for abdominal pain, diarrhea and constipation.  Genitourinary: Negative for dysuria and urgency.  Physical Exam   Blood pressure 102/55, pulse 62, temperature 97.6 F (36.4 C), temperature source Oral, resp. rate 18, height 5\' 5"  (1.651 m), weight 80.377 kg (177 lb 3.2 oz), last menstrual period 01/03/2013, SpO2 98.00%.  Physical Exam  Vitals reviewed. Constitutional: She is oriented to person, place, and time. She appears well-developed and well-nourished.  HENT:  Head: Normocephalic.  Eyes: Pupils are equal, round, and reactive to light.  Neck: Normal range of motion. Neck supple.  Cardiovascular: Normal rate.   Respiratory: Effort normal.  GI: Soft. She exhibits no distension. There is no tenderness. There is no rebound.  FHT audible with doppler 165bpm  Musculoskeletal: Normal range of motion.  Neurological: She is alert and oriented to person, place, and time.  Skin: Skin is warm and dry.  Psychiatric: She has a  normal mood and affect.    MAU Course  Procedures Results for orders placed during the hospital encounter of 02/17/13 (from the past 24 hour(s))  URINALYSIS, ROUTINE W REFLEX MICROSCOPIC     Status: Abnormal   Collection Time    02/17/13 10:15 AM      Result Value Range   Color, Urine YELLOW  YELLOW   APPearance CLOUDY (*) CLEAR   Specific Gravity, Urine 1.015  1.005 - 1.030   pH >9.0 (*) 5.0 - 8.0   Glucose, UA NEGATIVE  NEGATIVE mg/dL   Hgb urine dipstick NEGATIVE  NEGATIVE   Bilirubin Urine NEGATIVE  NEGATIVE   Ketones, ur NEGATIVE  NEGATIVE mg/dL   Protein, ur NEGATIVE  NEGATIVE mg/dL   Urobilinogen, UA 0.2  0.0 - 1.0 mg/dL   Nitrite NEGATIVE  NEGATIVE   Leukocytes, UA TRACE (*) NEGATIVE  URINE MICROSCOPIC-ADD ON     Status: Abnormal   Collection Time    02/17/13 10:15 AM      Result Value Range   Squamous Epithelial / LPF MANY (*) RARE   WBC, UA 0-2  <3 WBC/hpf   Bacteria, UA FEW (*) RARE   Urine-Other AMORPHOUS URATES/PHOSPHATES     zofran ODT given for nausea and pyridium Urine culture pending Assessment and Plan  Nausea and vomiting in pregnancy- Zofran, phenergn and Diclegis prescription IC Prescription for Pyridium- will need to f/u with urologist after penatal care established F/u with prenatal care  [facility] 02/17/2013, 10:26 AM

## 2013-02-17 NOTE — MAU Provider Note (Signed)
Chart reviewed and agree with management and plan.  

## 2013-02-27 ENCOUNTER — Inpatient Hospital Stay (HOSPITAL_COMMUNITY)
Admission: AD | Admit: 2013-02-27 | Discharge: 2013-02-27 | Disposition: A | Discharge status: Home / Self Care | Payer: Medicaid Other | Source: Ambulatory Visit | Attending: Obstetrics & Gynecology | Admitting: Obstetrics & Gynecology

## 2013-02-27 ENCOUNTER — Encounter (HOSPITAL_COMMUNITY): Payer: Self-pay | Admitting: *Deleted

## 2013-02-27 DIAGNOSIS — N760 Acute vaginitis: Secondary | ICD-10-CM | POA: Insufficient documentation

## 2013-02-27 DIAGNOSIS — B9689 Other specified bacterial agents as the cause of diseases classified elsewhere: Secondary | ICD-10-CM

## 2013-02-27 DIAGNOSIS — A499 Bacterial infection, unspecified: Secondary | ICD-10-CM

## 2013-02-27 DIAGNOSIS — O2390 Unspecified genitourinary tract infection in pregnancy, unspecified trimester: Secondary | ICD-10-CM | POA: Insufficient documentation

## 2013-02-27 DIAGNOSIS — M549 Dorsalgia, unspecified: Secondary | ICD-10-CM | POA: Insufficient documentation

## 2013-02-27 DIAGNOSIS — R109 Unspecified abdominal pain: Secondary | ICD-10-CM | POA: Insufficient documentation

## 2013-02-27 LAB — URINE MICROSCOPIC-ADD ON

## 2013-02-27 LAB — URINALYSIS, ROUTINE W REFLEX MICROSCOPIC
Bilirubin Urine: NEGATIVE
Hgb urine dipstick: NEGATIVE
Nitrite: NEGATIVE
Specific Gravity, Urine: <1.005 — ABNORMAL LOW (ref 1.005–1.030)
Urobilinogen, UA: 0.2 mg/dL (ref 0.0–1.0)
pH: 6.5 (ref 5.0–8.0)

## 2013-02-27 LAB — COMPREHENSIVE METABOLIC PANEL
ALT: 17 U/L (ref 0–35)
AST: 15 U/L (ref 0–37)
Albumin: 3.3 g/dL — ABNORMAL LOW (ref 3.5–5.2)
Alkaline Phosphatase: 41 U/L (ref 39–117)
Calcium: 9.1 mg/dL (ref 8.4–10.5)
Potassium: 3.9 mEq/L (ref 3.5–5.1)
Sodium: 137 mEq/L (ref 135–145)
Total Protein: 6.3 g/dL (ref 6.0–8.3)

## 2013-02-27 LAB — CBC
Hemoglobin: 12.1 g/dL (ref 12.0–15.0)
MCH: 27.1 pg (ref 26.0–34.0)
MCHC: 33.1 g/dL (ref 30.0–36.0)
Platelets: 199 10*3/uL (ref 150–400)

## 2013-02-27 MED ORDER — CYCLOBENZAPRINE HCL 10 MG PO TABS: 10.0000 mg | ORAL_TABLET | Freq: Two times a day (BID) | ORAL | Status: DC | PRN

## 2013-02-27 MED ORDER — METRONIDAZOLE 500 MG PO TABS: 500.0000 mg | ORAL_TABLET | Freq: Two times a day (BID) | ORAL | Status: DC

## 2013-02-27 MED ORDER — OXYCODONE-ACETAMINOPHEN 5-325 MG PO TABS
1.0000 | ORAL_TABLET | Freq: Once | ORAL | Status: AC
  Administered 2013-02-27: 1 via ORAL
  Filled 2013-02-27: qty 1

## 2013-02-27 NOTE — Progress Notes (Signed)
Patient calmer now. Still C/O pain, but lying still in bed and able to talk.

## 2013-02-27 NOTE — MAU Note (Addendum)
C/O lower abdominal pain since this morning. States she woke up with a HA. Went to work, pain became worse. States the pain "hit me real bad and I couldn't stand up." Patient crying out loud, rocking back and forth in bed.

## 2013-02-27 NOTE — MAU Provider Note (Signed)
History     CSN: [id number]  Arrival date and time: 02/27/13 1217   First Provider Initiated Contact with Patient 02/27/13 1324      Chief Complaint  Patient presents with  . Abdominal Pain   HPI  [name] is a 27 y.o. female G1P0 at [redacted]w[redacted]d who presents with abdominal pain. Pain starts in the lower left side of her back and radiates through to her lower abdomen. The pain started this morning; she attempted to go to work however doubled over in pain and left. She currently rates her pain a 10/10. Nothing makes it better; she has tried changing positions while standing and sitting with no relief. She has an appointment on 03/07/13 with Dr. Senaida Massey.  She had a wet prep done on 02/07/2013 that showed moderate clue cells; pt says she was never treated for BV. Pt has a history of chronic back pain/ arthritis due to playing hockey in the past; "back pain has not acted up in a while".  OB History   Grav Para Term Preterm Abortions TAB SAB Ect Mult Living   1         0      Past Medical History  Diagnosis Date  . Gastric reflux   . Anxiety   . Basedow's disease   . Pyelonephritis   . History of nephrolithiasis   . Anxiety disorder   . Bipolar disorder   . Interstitial cystitis   . Chronic back pain   . Fibroid     Past Surgical History  Procedure Laterality Date  . Tonsillectomy    . Adenoidectomy    . Tear duct probing      Family History  Problem Relation Age of Onset  . Diabetes    . Hypertension    . Lung cancer    . Bipolar disorder Sister     History  Substance Use Topics  . Smoking status: Current Every Day Smoker -- 0.25 packs/day for 8 years    Types: Cigarettes  . Smokeless tobacco: Never Used  . Alcohol Use: No     Comment: Couple months since last use of alcohol states pt.  Pt reports that she would drink 1/2 of a fifth to take away emotional pain.     Allergies: No Known Allergies  Prescriptions prior to admission  Medication Sig  Dispense Refill  . ondansetron (ZOFRAN ODT) 8 MG disintegrating tablet Take 1 tablet (8 mg total) by mouth every 8 (eight) hours as needed for nausea.  20 tablet  0  . phenazopyridine (PYRIDIUM) 200 MG tablet Take 1 tablet (200 mg total) by mouth 3 (three) times daily.  18 tablet  0  . Prenatal Vit-Fe Fumarate-FA (PRENATAL MULTIVITAMIN) TABS tablet Take 1 tablet by mouth daily at 12 noon.      . promethazine (PHENERGAN) 25 MG tablet Take 1 tablet (25 mg total) by mouth every 6 (six) hours as needed for nausea.  30 tablet  0   Results for orders placed during the hospital encounter of 02/27/13 (from the past 24 hour(s))  URINALYSIS, ROUTINE W REFLEX MICROSCOPIC     Status: Abnormal   Collection Time    02/27/13 12:46 PM      Result Value Range   Color, Urine YELLOW  YELLOW   APPearance HAZY (*) CLEAR   Specific Gravity, Urine <1.005 (*) 1.005 - 1.030   pH 6.5  5.0 - 8.0   Glucose, UA NEGATIVE  NEGATIVE mg/dL  Hgb urine dipstick NEGATIVE  NEGATIVE   Bilirubin Urine NEGATIVE  NEGATIVE   Ketones, ur NEGATIVE  NEGATIVE mg/dL   Protein, ur NEGATIVE  NEGATIVE mg/dL   Urobilinogen, UA 0.2  0.0 - 1.0 mg/dL   Nitrite NEGATIVE  NEGATIVE   Leukocytes, UA SMALL (*) NEGATIVE  URINE MICROSCOPIC-ADD ON     Status: Abnormal   Collection Time    02/27/13 12:46 PM      Result Value Range   Squamous Epithelial / LPF MANY (*) RARE   WBC, UA 0-2  <3 WBC/hpf   Bacteria, UA MANY (*) RARE  CBC     Status: None   Collection Time    02/27/13  1:41 PM      Result Value Range   WBC 10.3  4.0 - 10.5 K/uL   RBC 4.47  3.87 - 5.11 MIL/uL   Hemoglobin 12.1  12.0 - 15.0 g/dL   HCT 16.1  09.6 - 04.5 %   MCV 81.9  78.0 - 100.0 fL   MCH 27.1  26.0 - 34.0 pg   MCHC 33.1  30.0 - 36.0 g/dL   RDW 40.9  81.1 - 91.4 %   Platelets 199  150 - 400 K/uL  COMPREHENSIVE METABOLIC PANEL     Status: Abnormal   Collection Time    02/27/13  1:41 PM      Result Value Range   Sodium 137  135 - 145 mEq/L   Potassium 3.9   3.5 - 5.1 mEq/L   Chloride 102  96 - 112 mEq/L   CO2 25  19 - 32 mEq/L   Glucose, Bld 86  70 - 99 mg/dL   BUN 3 (*) 6 - 23 mg/dL   Creatinine, Ser 7.82  0.50 - 1.10 mg/dL   Calcium 9.1  8.4 - 95.6 mg/dL   Total Protein 6.3  6.0 - 8.3 g/dL   Albumin 3.3 (*) 3.5 - 5.2 g/dL   AST 15  0 - 37 U/L   ALT 17  0 - 35 U/L   Alkaline Phosphatase 41  39 - 117 U/L   Total Bilirubin 0.2 (*) 0.3 - 1.2 mg/dL   GFR calc non Af Amer >90  >90 mL/min   GFR calc Af Amer >90  >90 mL/min  WET PREP, GENITAL     Status: Abnormal   Collection Time    02/27/13  2:25 PM      Result Value Range   Yeast Wet Prep HPF POC NONE SEEN  NONE SEEN   Trich, Wet Prep NONE SEEN  NONE SEEN   Clue Cells Wet Prep HPF POC FEW (*) NONE SEEN   WBC, Wet Prep HPF POC FEW (*) NONE SEEN     Review of Systems  Constitutional: Negative for fever and chills.  Gastrointestinal: Positive for nausea and vomiting. Negative for diarrhea and constipation.       Last BM was yesterday and it was normal.   Genitourinary: Positive for flank pain. Negative for dysuria, urgency, frequency and hematuria.  Neurological: Positive for headaches. Negative for dizziness and weakness.   Physical Exam   Blood pressure 110/66, pulse 87, temperature 97.9 F (36.6 C), temperature source Oral, resp. rate 20, last menstrual period 01/03/2013. Fetal HR 155 bpm by fetal doppler    Physical Exam  Constitutional: She is oriented to person, place, and time. She appears well-developed and well-nourished. No distress.  GI: Soft. There is tenderness. There is no rebound and no guarding.  Mild Left lower-mid quadrant tenderness  Genitourinary:  Speculum exam: Vagina - Moderate amount of creamy, white discharge, Strong fishy odor  Cervix - No contact bleeding Bimanual exam: Cervix closed Uterus non tender, Gravid; normal size for gestational age Adnexa non tender, no masses bilaterally Wet prep done Chaperone present for exam.   Neurological: She  is alert and oriented to person, place, and time.  Skin: Skin is warm and dry. She is not diaphoretic.  Psychiatric: Her behavior is normal.    MAU Course  Procedures None  MDM +fht Wet Prep Pt feels a lot better after receiving 1 percocet PO; she is sitting up in bed asking to eat and drink. She is requesting a work note to be out for the remainder of the day. Pt requests a prescription for percocet; I informed her that flexeril may be a better option for her back discomfort and to discuss other options at her prenatal appointment. Assessment and Plan  A: Round ligament pain vs. chronic back pain Bacterial vaginosis   P: Discharge home  FX: Flagyl 500 mg PO BID times 7 days (#14) no rf        Flexeril 10 mg PO BID as needed for back pain (#10) no rf Ok to take tylenol as directed on the bottle for back pain Increase your water intake; Drink at least 8 8-oz glasses of water every day. Return to MAU if symptoms worsen Keep your scheduled appointment with Dr. Berenda Morale office. Pregnancy support belt recommended   RASCH, [name] FNP-C 02/27/2013, 3:40 PM

## 2013-02-28 NOTE — MAU Provider Note (Signed)
Attestation of Attending Supervision of Advanced Practitioner (CNM/NP): Evaluation and management procedures were performed by the Advanced Practitioner under my supervision and collaboration. I have reviewed the Advanced Practitioner's note and chart, and I agree with the management and plan.  Dorlene Footman H. 6:39 AM

## 2013-03-13 LAB — OB RESULTS CONSOLE HEPATITIS B SURFACE ANTIGEN: HEP B S AG: NEGATIVE

## 2013-03-13 LAB — OB RESULTS CONSOLE RUBELLA ANTIBODY, IGM: RUBELLA: IMMUNE

## 2013-03-13 LAB — OB RESULTS CONSOLE GC/CHLAMYDIA
CHLAMYDIA, DNA PROBE: NEGATIVE
Gonorrhea: NEGATIVE

## 2013-03-13 LAB — OB RESULTS CONSOLE ABO/RH: RH Type: POSITIVE

## 2013-03-13 LAB — OB RESULTS CONSOLE HIV ANTIBODY (ROUTINE TESTING): HIV: NONREACTIVE

## 2013-03-13 LAB — OB RESULTS CONSOLE RPR: RPR: NONREACTIVE

## 2013-03-13 LAB — OB RESULTS CONSOLE ANTIBODY SCREEN: Antibody Screen: NEGATIVE

## 2013-03-14 ENCOUNTER — Encounter (HOSPITAL_COMMUNITY): Payer: Self-pay | Admitting: *Deleted

## 2013-03-14 ENCOUNTER — Inpatient Hospital Stay (HOSPITAL_COMMUNITY)
Admission: AD | Admit: 2013-03-14 | Discharge: 2013-03-14 | Disposition: A | Discharge status: Home / Self Care | Payer: Medicaid Other | Source: Ambulatory Visit | Attending: Obstetrics and Gynecology | Admitting: Obstetrics and Gynecology

## 2013-03-14 DIAGNOSIS — O2390 Unspecified genitourinary tract infection in pregnancy, unspecified trimester: Secondary | ICD-10-CM | POA: Insufficient documentation

## 2013-03-14 DIAGNOSIS — O219 Vomiting of pregnancy, unspecified: Secondary | ICD-10-CM

## 2013-03-14 DIAGNOSIS — O2342 Unspecified infection of urinary tract in pregnancy, second trimester: Secondary | ICD-10-CM

## 2013-03-14 DIAGNOSIS — O210 Mild hyperemesis gravidarum: Secondary | ICD-10-CM | POA: Insufficient documentation

## 2013-03-14 DIAGNOSIS — N390 Urinary tract infection, site not specified: Secondary | ICD-10-CM | POA: Insufficient documentation

## 2013-03-14 LAB — COMPREHENSIVE METABOLIC PANEL
ALT: 46 U/L — ABNORMAL HIGH (ref 0–35)
AST: 59 U/L — ABNORMAL HIGH (ref 0–37)
Alkaline Phosphatase: 41 U/L (ref 39–117)
CO2: 22 mEq/L (ref 19–32)
GFR calc Af Amer: >90 mL/min (ref >90)
Glucose, Bld: 78 mg/dL (ref 70–99)
Potassium: 3.6 mEq/L (ref 3.5–5.1)
Sodium: 135 mEq/L (ref 135–145)
Total Protein: 7 g/dL (ref 6.0–8.3)

## 2013-03-14 LAB — URINALYSIS, ROUTINE W REFLEX MICROSCOPIC
Glucose, UA: 100 mg/dL — AB
Ketones, ur: 40 mg/dL — AB
Protein, ur: 30 mg/dL — AB

## 2013-03-14 LAB — CBC
Hemoglobin: 12.5 g/dL (ref 12.0–15.0)
MCHC: 32.9 g/dL (ref 30.0–36.0)
Platelets: 205 10*3/uL (ref 150–400)
RBC: 4.65 MIL/uL (ref 3.87–5.11)

## 2013-03-14 LAB — URINE MICROSCOPIC-ADD ON

## 2013-03-14 MED ORDER — NITROFURANTOIN MONOHYD MACRO 100 MG PO CAPS: 100.0000 mg | ORAL_CAPSULE | Freq: Two times a day (BID) | ORAL | Status: DC

## 2013-03-14 MED ORDER — DEXTROSE 5 % IV SOLN
2.0000 g | INTRAVENOUS | Status: AC
  Administered 2013-03-14: 2 g via INTRAVENOUS
  Filled 2013-03-14: qty 2

## 2013-03-14 MED ORDER — PHENAZOPYRIDINE HCL 100 MG PO TABS: 100.0000 mg | ORAL_TABLET | ORAL | Status: DC

## 2013-03-14 MED ORDER — FAMOTIDINE IN NACL 20-0.9 MG/50ML-% IV SOLN
20.0000 mg | INTRAVENOUS | Status: AC
  Administered 2013-03-14: 20 mg via INTRAVENOUS
  Filled 2013-03-14: qty 50

## 2013-03-14 MED ORDER — PROMETHAZINE HCL 25 MG RE SUPP: 25.0000 mg | Freq: Four times a day (QID) | RECTAL | Status: DC | PRN

## 2013-03-14 MED ORDER — PROMETHAZINE HCL 25 MG/ML IJ SOLN
25.0000 mg | Freq: Once | INTRAVENOUS | Status: AC
  Administered 2013-03-14: 25 mg via INTRAVENOUS
  Filled 2013-03-14: qty 1

## 2013-03-14 MED ORDER — ONDANSETRON HCL 4 MG/2ML IJ SOLN
4.0000 mg | INTRAMUSCULAR | Status: AC
  Administered 2013-03-14: 4 mg via INTRAVENOUS
  Filled 2013-03-14: qty 2

## 2013-03-14 MED ORDER — SODIUM CHLORIDE 0.9 % IV BOLUS (SEPSIS)
1000.0000 mL | Freq: Once | INTRAVENOUS | Status: AC
  Administered 2013-03-14: 1000 mL via INTRAVENOUS

## 2013-03-14 MED ORDER — HYDROMORPHONE HCL PF 1 MG/ML IJ SOLN
1.0000 mg | INTRAMUSCULAR | Status: AC
  Administered 2013-03-14: 1 mg via INTRAMUSCULAR
  Filled 2013-03-14: qty 1

## 2013-03-14 NOTE — MAU Provider Note (Signed)
Chief Complaint: Urinary Tract Infection   First Provider Initiated Contact with Patient 03/14/13 1603     SUBJECTIVE HPI: [name] is a 27 y.o. G1P0 at [redacted]w[redacted]d by LMP who presents to maternity admissions sent by Dr Senaida Ores from the office for UTI and n/v, with inability to tolerate PO abx.  Pt started Macrobid on Monday and reports she has vomited most of her doses since then. She reports feeling pressure in her lower abdomen and urinary urgency and frequency. She denies LOF, vaginal bleeding, vaginal itching/burning, h/a, dizziness, n/v, or fever/chills.    Pt mother arrives to MAU and reports that pt has hx of pyelonephritis and she is worried that this is developing again. Pt has not had pyelonephritis during pregnancy.    Past Medical History  Diagnosis Date  . Gastric reflux   . Anxiety   . Basedow's disease   . Pyelonephritis   . History of nephrolithiasis   . Anxiety disorder   . Bipolar disorder   . Interstitial cystitis   . Chronic back pain   . Fibroid    Past Surgical History  Procedure Laterality Date  . Tonsillectomy    . Adenoidectomy    . Tear duct probing     History   Social History  . Marital Status: Single    Spouse Name: N/A    Number of Children: N/A  . Years of Education: N/A   Occupational History  . driver     [employer]   Social History Main Topics  . Smoking status: Current Every Day Smoker -- 0.25 packs/day for 8 years    Types: Cigarettes  . Smokeless tobacco: Never Used  . Alcohol Use: No     Comment: Couple months since last use of alcohol states pt.  Pt reports that she would drink 1/2 of a fifth to take away emotional pain.   . Drug Use: No  . Sexual Activity: Yes    Birth Control/ Protection: None   Other Topics Concern  . Not on file   Social History Narrative  . No narrative on file   No current facility-administered medications on file prior to encounter.   Current Outpatient Prescriptions on File Prior to  Encounter  Medication Sig Dispense Refill  . cyclobenzaprine (FLEXERIL) 10 MG tablet Take 1 tablet (10 mg total) by mouth 2 (two) times daily as needed for muscle spasms.  10 tablet  0  . ondansetron (ZOFRAN ODT) 8 MG disintegrating tablet Take 1 tablet (8 mg total) by mouth every 8 (eight) hours as needed for nausea.  20 tablet  0  . Prenatal Vit-Fe Fumarate-FA (PRENATAL MULTIVITAMIN) TABS tablet Take 1 tablet by mouth at bedtime.        No Known Allergies  ROS: Pertinent items in HPI  OBJECTIVE Blood pressure 107/58, pulse 77, temperature 97.8 F (36.6 C), temperature source Oral, resp. rate 16, height 5\' 5"  (1.651 m), weight 79.561 kg (175 lb 6.4 oz), last menstrual period 01/03/2013. GENERAL: Well-developed, well-nourished female in no mild distress.  HEENT: Normocephalic HEART: normal rate RESP: normal effort ABDOMEN: Soft, non-tender BACK:  Negative CVA tenderness on initial exam, reexam in 2 hours reveals mild CVA tenderness with generalized tenderness of left lower back, left flank EXTREMITIES: Nontender, no edema NEURO: Alert and oriented  FHT 165  LAB RESULTS Results for orders placed during the hospital encounter of 03/14/13 (from the past 24 hour(s))  URINALYSIS, ROUTINE W REFLEX MICROSCOPIC     Status: Abnormal  Collection Time    03/14/13  1:35 PM      Result Value Range   Color, Urine ORANGE (*) YELLOW   APPearance HAZY (*) CLEAR   Specific Gravity, Urine >1.030 (*) 1.005 - 1.030   pH 5.0  5.0 - 8.0   Glucose, UA 100 (*) NEGATIVE mg/dL   Hgb urine dipstick TRACE (*) NEGATIVE   Bilirubin Urine NEGATIVE  NEGATIVE   Ketones, ur 40 (*) NEGATIVE mg/dL   Protein, ur 30 (*) NEGATIVE mg/dL   Urobilinogen, UA 4.0 (*) 0.0 - 1.0 mg/dL   Nitrite POSITIVE (*) NEGATIVE   Leukocytes, UA NEGATIVE  NEGATIVE  URINE MICROSCOPIC-ADD ON     Status: Abnormal   Collection Time    03/14/13  1:35 PM      Result Value Range   Squamous Epithelial / LPF MANY (*) RARE   WBC, UA 0-2   <3 WBC/hpf   RBC / HPF 0-2  <3 RBC/hpf   Bacteria, UA FEW (*) RARE   Urine-Other MUCOUS PRESENT    CBC     Status: Abnormal   Collection Time    03/14/13  4:13 PM      Result Value Range   WBC 12.7 (*) 4.0 - 10.5 K/uL   RBC 4.65  3.87 - 5.11 MIL/uL   Hemoglobin 12.5  12.0 - 15.0 g/dL   HCT 11.9  14.7 - 82.9 %   MCV 81.7  78.0 - 100.0 fL   MCH 26.9  26.0 - 34.0 pg   MCHC 32.9  30.0 - 36.0 g/dL   RDW 56.2  13.0 - 86.5 %   Platelets 205  150 - 400 K/uL  COMPREHENSIVE METABOLIC PANEL     Status: Abnormal   Collection Time    03/14/13  4:13 PM      Result Value Range   Sodium 135  135 - 145 mEq/L   Potassium 3.6  3.5 - 5.1 mEq/L   Chloride 100  96 - 112 mEq/L   CO2 22  19 - 32 mEq/L   Glucose, Bld 78  70 - 99 mg/dL   BUN 5 (*) 6 - 23 mg/dL   Creatinine, Ser 7.84  0.50 - 1.10 mg/dL   Calcium 9.5  8.4 - 69.6 mg/dL   Total Protein 7.0  6.0 - 8.3 g/dL   Albumin 3.5  3.5 - 5.2 g/dL   AST 59 (*) 0 - 37 U/L   ALT 46 (*) 0 - 35 U/L   Alkaline Phosphatase 41  39 - 117 U/L   Total Bilirubin 0.2 (*) 0.3 - 1.2 mg/dL   GFR calc non Af Amer >90  >90 mL/min   GFR calc Af Amer >90  >90 mL/min     ASSESSMENT No diagnosis found.  PLAN D5 LR with Phenergan 25 mg x1000 ml, Rocephin 1 g IV, Zofran 4 mg IV, Pepcid 20 mg IV given in MAU per Dr Senaida Ores.  Pt continues to report nausea, but has not vomited in MAU. Consult Dr Jackelyn Knife D/C home  Macrobid BID x 7 days Phenergan 25 mg suppositories Reassurance that no WBC count, no fever makes pyelo unlikely. UTI and nausea treated.  F/U in office Return to MAU as needed    Medication List    STOP taking these medications       ondansetron 8 MG disintegrating tablet  Commonly known as:  ZOFRAN ODT      TAKE these medications       nitrofurantoin (  macrocrystal-monohydrate) 100 MG capsule  Commonly known as:  MACROBID  Take 1 capsule (100 mg total) by mouth 2 (two) times daily.     phenazopyridine 200 MG tablet  Commonly known as:   PYRIDIUM  Take 200 mg by mouth 3 (three) times daily as needed for pain.     prenatal multivitamin Tabs tablet  Take 1 tablet by mouth at bedtime.     promethazine 25 MG suppository  Commonly known as:  PHENERGAN  Place 1 suppository (25 mg total) rectally every 6 (six) hours as needed for nausea.     Vitamin-B Complex Tabs  Take 1 tablet by mouth daily.       Sharen Counter Certified Nurse-Midwife 03/14/2013  6:58 PM

## 2013-03-14 NOTE — MAU Note (Signed)
Feeling nauseated again and unable to try the Sprite or crackers; Refusing pyridium due to nausea;

## 2013-03-14 NOTE — MAU Note (Signed)
Started taking Macrobid for UTI on Monday night; having trouble with N&V and can not keep food or meds down; sent from OB's office for further evaluation;

## 2013-03-14 NOTE — MAU Note (Signed)
Pt reports she has a UTI infection. Went to MDs office and was sent over here because she cannot keep medication down. Keep throwing up.

## 2013-03-16 ENCOUNTER — Inpatient Hospital Stay (HOSPITAL_COMMUNITY)
Admission: AD | Admit: 2013-03-16 | Discharge: 2013-03-16 | Disposition: A | Discharge status: Home / Self Care | Payer: Medicaid Other | Source: Ambulatory Visit | Attending: Obstetrics and Gynecology | Admitting: Obstetrics and Gynecology

## 2013-03-16 ENCOUNTER — Encounter (HOSPITAL_COMMUNITY): Payer: Self-pay | Admitting: *Deleted

## 2013-03-16 DIAGNOSIS — O2390 Unspecified genitourinary tract infection in pregnancy, unspecified trimester: Secondary | ICD-10-CM | POA: Insufficient documentation

## 2013-03-16 DIAGNOSIS — N390 Urinary tract infection, site not specified: Secondary | ICD-10-CM | POA: Insufficient documentation

## 2013-03-16 DIAGNOSIS — O210 Mild hyperemesis gravidarum: Secondary | ICD-10-CM | POA: Insufficient documentation

## 2013-03-16 DIAGNOSIS — O219 Vomiting of pregnancy, unspecified: Secondary | ICD-10-CM

## 2013-03-16 LAB — URINALYSIS, ROUTINE W REFLEX MICROSCOPIC
Bilirubin Urine: NEGATIVE
Ketones, ur: NEGATIVE mg/dL
Leukocytes, UA: NEGATIVE
Nitrite: NEGATIVE
Protein, ur: NEGATIVE mg/dL

## 2013-03-16 LAB — URINE MICROSCOPIC-ADD ON

## 2013-03-16 LAB — CBC
HCT: 39.3 % (ref 36.0–46.0)
Hemoglobin: 13.1 g/dL (ref 12.0–15.0)
MCH: 27 pg (ref 26.0–34.0)
MCHC: 33.3 g/dL (ref 30.0–36.0)
RDW: 14 % (ref 11.5–15.5)

## 2013-03-16 LAB — URINE CULTURE
Colony Count: NO GROWTH
Culture: NO GROWTH

## 2013-03-16 MED ORDER — ONDANSETRON HCL 4 MG/2ML IJ SOLN
4.0000 mg | Freq: Once | INTRAMUSCULAR | Status: AC
  Administered 2013-03-16: 4 mg via INTRAVENOUS
  Filled 2013-03-16: qty 2

## 2013-03-16 MED ORDER — ONDANSETRON 8 MG PO TBDP: 8.0000 mg | ORAL_TABLET | Freq: Three times a day (TID) | ORAL | Status: DC | PRN

## 2013-03-16 MED ORDER — GENTAMICIN SULFATE 40 MG/ML IJ SOLN
320.0000 mg | Freq: Once | INTRAVENOUS | Status: AC
  Administered 2013-03-16: 320 mg via INTRAVENOUS
  Filled 2013-03-16: qty 8

## 2013-03-16 MED ORDER — SODIUM CHLORIDE 0.9 % IV SOLN
INTRAVENOUS | Status: DC
  Administered 2013-03-16: 15:00:00 via INTRAVENOUS

## 2013-03-16 MED ORDER — LACTATED RINGERS IV SOLN
INTRAVENOUS | Status: DC
  Administered 2013-03-16: 16:00:00 via INTRAVENOUS

## 2013-03-16 NOTE — MAU Note (Signed)
Pt was seen by Dr. Senaida Ores on 03-14-13 for UTI symptoms and culture was sent in office.  Pt advised to come to MAU for IV fluids for vomiting and antibiotics for UTI.  Pt c/o left lower back pain when urination, increase in frequency with small amounts of urine.  Pt trying to drink fluids but vomits after.  Pt has not taken rectal phenergan since yesterday and doesn't want to take it today because of the stinging it causes.

## 2013-03-16 NOTE — MAU Provider Note (Signed)
History     CSN: [id number]  Arrival date and time: 03/16/13 1250   First Provider Initiated Contact with Patient 03/16/13 1404      Chief Complaint  Patient presents with  . Hyperemesis Gravidarum  . Urinary Tract Infection   HPI [name] is a 27 y.o. G1P0 at [redacted]w[redacted]d who presents to MAU today with complaint of multi-drug resistant UTI and hyperemesis. The patient was diagnosed with UTI in the office this week. She was sent to MAU on Wednesday for IV fluids and IV antibiotics. The patient states that she has had a slight improvement in N/V since then, but still taking Phenergan in order to take her Macrobid without vomiting. The patient continues to have dysuria, frequency and flank pain on the left side. She denies fever.    OB History   Grav Para Term Preterm Abortions TAB SAB Ect Mult Living   1         0      Past Medical History  Diagnosis Date  . Gastric reflux   . Anxiety   . Basedow's disease   . Pyelonephritis   . History of nephrolithiasis   . Anxiety disorder   . Bipolar disorder   . Interstitial cystitis   . Chronic back pain   . Fibroid     Past Surgical History  Procedure Laterality Date  . Tonsillectomy    . Adenoidectomy    . Tear duct probing      Family History  Problem Relation Age of Onset  . Diabetes    . Hypertension    . Lung cancer    . Bipolar disorder Sister     History  Substance Use Topics  . Smoking status: Current Every Day Smoker -- 0.25 packs/day for 8 years    Types: Cigarettes  . Smokeless tobacco: Never Used  . Alcohol Use: No     Comment: Couple months since last use of alcohol states pt.  Pt reports that she would drink 1/2 of a fifth to take away emotional pain.     Allergies: No Known Allergies  Prescriptions prior to admission  Medication Sig Dispense Refill  . B Complex Vitamins (VITAMIN-B COMPLEX) TABS Take 1 tablet by mouth daily.      . nitrofurantoin, macrocrystal-monohydrate, (MACROBID) 100  MG capsule Take 1 capsule (100 mg total) by mouth 2 (two) times daily.  14 capsule  0  . ondansetron (ZOFRAN ODT) 8 MG disintegrating tablet Take 1 tablet (8 mg total) by mouth every 8 (eight) hours as needed for nausea.  20 tablet  0  . phenazopyridine (PYRIDIUM) 200 MG tablet Take 200 mg by mouth 3 (three) times daily as needed for pain.      . Prenatal Vit-Fe Fumarate-FA (PRENATAL MULTIVITAMIN) TABS tablet Take 1 tablet by mouth at bedtime.       . promethazine (PHENERGAN) 25 MG suppository Place 1 suppository (25 mg total) rectally every 6 (six) hours as needed for nausea.  12 each  0    Review of Systems  Constitutional: Negative for fever and malaise/fatigue.  Gastrointestinal: Positive for nausea and vomiting. Negative for abdominal pain, diarrhea and constipation.  Genitourinary: Positive for dysuria, urgency, frequency and flank pain. Negative for hematuria.       Neg - vaginal bleeding, discharge   Physical Exam   Blood pressure 109/81, pulse 95, temperature 97.4 F (36.3 C), temperature source Oral, resp. rate 16, height 6' 5.25" (1.962 m), weight 172 lb  9.6 oz (78.291 kg), last menstrual period 01/03/2013.  Physical Exam  Constitutional: She is oriented to person, place, and time. She appears well-developed and well-nourished. No distress.  HENT:  Head: Normocephalic and atraumatic.  Cardiovascular: Normal rate, regular rhythm and normal heart sounds.   Respiratory: Effort normal and breath sounds normal. No respiratory distress.  GI: Soft. Bowel sounds are normal. She exhibits no distension and no mass. There is no tenderness. There is CVA tenderness (left side only). There is no rebound and no guarding.  Neurological: She is alert and oriented to person, place, and time.  Skin: Skin is warm and dry. No erythema.  Psychiatric: She has a normal mood and affect.   Results for orders placed during the hospital encounter of 03/16/13 (from the past 24 hour(s))  URINALYSIS,  ROUTINE W REFLEX MICROSCOPIC     Status: Abnormal   Collection Time    03/16/13  1:30 PM      Result Value Range   Color, Urine YELLOW  YELLOW   APPearance CLEAR  CLEAR   Specific Gravity, Urine 1.010  1.005 - 1.030   pH 6.5  5.0 - 8.0   Glucose, UA NEGATIVE  NEGATIVE mg/dL   Hgb urine dipstick TRACE (*) NEGATIVE   Bilirubin Urine NEGATIVE  NEGATIVE   Ketones, ur NEGATIVE  NEGATIVE mg/dL   Protein, ur NEGATIVE  NEGATIVE mg/dL   Urobilinogen, UA 0.2  0.0 - 1.0 mg/dL   Nitrite NEGATIVE  NEGATIVE   Leukocytes, UA NEGATIVE  NEGATIVE  URINE MICROSCOPIC-ADD ON     Status: Abnormal   Collection Time    03/16/13  1:30 PM      Result Value Range   Squamous Epithelial / LPF MANY (*) RARE   WBC, UA 0-2  <3 WBC/hpf   RBC / HPF 0-2  <3 RBC/hpf   Urine-Other MUCOUS PRESENT    CBC     Status: Abnormal   Collection Time    03/16/13  2:36 PM      Result Value Range   WBC 11.9 (*) 4.0 - 10.5 K/uL   RBC 4.85  3.87 - 5.11 MIL/uL   Hemoglobin 13.1  12.0 - 15.0 g/dL   HCT 40.9  81.1 - 91.4 %   MCV 81.0  78.0 - 100.0 fL   MCH 27.0  26.0 - 34.0 pg   MCHC 33.3  30.0 - 36.0 g/dL   RDW 78.2  95.6 - 21.3 %   Platelets 207  150 - 400 K/uL     MAU Course  Procedures None  MDM Discussed patient with Dr. Senaida Ores. She would like patient to have 1-2 L IV fluids with IV Zofran. Draw a CBC and consult pharmacy for a 24 hour dose of Gentamicin that could be administered in MAU and is safe in pregnancy.  Discussed patient with the PharmD. Loaded dose of Gentamicin for 24 hour coverage is not recommended in pregnancy.  Informed Dr. Senaida Ores of the PharmD information. We will wait for CBC results and decide on course of treatment at that time.  Discussed labs results with Dr. Senaida Ores. She states that she has spoken with the PharmD and explained the situation including using Macrobid at home following discharge. Ok to given Gentimicin in MAU today IV Gentimicin ordered and given in MAU Patient  reports some improvement in symptoms Patient states that she already has additional Rx for Macrobid x 7 days at the pharmacy that was sent on Wednesday. She is currently taking Macrobid  that was given in the office on Monday (Rx x 7 days) but has only been able to keep the pills down with phenergan x 2 days. She has been instructed to pick up the 2nd Rx for Macrobid and complete the course. Assessment and Plan  A: UTI in pregnancy Nausea and vomiting in pregnancy prior to [redacted] weeks gestation  P: Discharge home Rx for ODT Zofran sent to patient's pharmacy Patient advised to fill Rx for Macrobid from Wednesday's MAU visit Patient advised to follow-up with Dr. Senaida Ores as scheduled or sooner if symptoms persist or worsen Patient may return to MAU as needed or if her condition were to change or worsen  Freddi Starr, PA-C  03/16/2013, 4:30 PM

## 2013-05-12 ENCOUNTER — Encounter (HOSPITAL_COMMUNITY): Payer: Self-pay | Admitting: *Deleted

## 2013-05-12 ENCOUNTER — Inpatient Hospital Stay (HOSPITAL_COMMUNITY)
Admission: AD | Admit: 2013-05-12 | Discharge: 2013-05-12 | Disposition: A | Discharge status: Home / Self Care | Payer: Medicaid Other | Source: Ambulatory Visit | Attending: Obstetrics and Gynecology | Admitting: Obstetrics and Gynecology

## 2013-05-12 DIAGNOSIS — M549 Dorsalgia, unspecified: Secondary | ICD-10-CM

## 2013-05-12 DIAGNOSIS — M545 Low back pain, unspecified: Secondary | ICD-10-CM | POA: Insufficient documentation

## 2013-05-12 DIAGNOSIS — O99330 Smoking (tobacco) complicating pregnancy, unspecified trimester: Secondary | ICD-10-CM | POA: Insufficient documentation

## 2013-05-12 DIAGNOSIS — O9989 Other specified diseases and conditions complicating pregnancy, childbirth and the puerperium: Secondary | ICD-10-CM

## 2013-05-12 DIAGNOSIS — O99891 Other specified diseases and conditions complicating pregnancy: Secondary | ICD-10-CM | POA: Insufficient documentation

## 2013-05-12 LAB — URINALYSIS, ROUTINE W REFLEX MICROSCOPIC
Glucose, UA: NEGATIVE mg/dL
Hgb urine dipstick: NEGATIVE
Leukocytes, UA: NEGATIVE
Protein, ur: NEGATIVE mg/dL
Urobilinogen, UA: 0.2 mg/dL (ref 0.0–1.0)
pH: 8 (ref 5.0–8.0)

## 2013-05-12 LAB — COMPREHENSIVE METABOLIC PANEL
ALT: 24 U/L (ref 0–35)
AST: 19 U/L (ref 0–37)
Alkaline Phosphatase: 60 U/L (ref 39–117)
CO2: 23 mEq/L (ref 19–32)
Calcium: 8.7 mg/dL (ref 8.4–10.5)
Chloride: 102 mEq/L (ref 96–112)
GFR calc Af Amer: >90 mL/min (ref >90)
GFR calc non Af Amer: >90 mL/min (ref >90)
Glucose, Bld: 89 mg/dL (ref 70–99)
Potassium: 3.7 mEq/L (ref 3.5–5.1)
Sodium: 134 mEq/L — ABNORMAL LOW (ref 135–145)
Total Bilirubin: 0.2 mg/dL — ABNORMAL LOW (ref 0.3–1.2)

## 2013-05-12 LAB — CBC
Hemoglobin: 11.2 g/dL — ABNORMAL LOW (ref 12.0–15.0)
MCH: 27.4 pg (ref 26.0–34.0)
MCHC: 33.1 g/dL (ref 30.0–36.0)
Platelets: 186 10*3/uL (ref 150–400)
RDW: 14.3 % (ref 11.5–15.5)

## 2013-05-12 MED ORDER — LACTATED RINGERS IV SOLN
INTRAVENOUS | Status: DC
  Administered 2013-05-12: 12:00:00 via INTRAVENOUS

## 2013-05-12 MED ORDER — CYCLOBENZAPRINE HCL 10 MG PO TABS: 10.0000 mg | ORAL_TABLET | Freq: Once | ORAL | Status: DC

## 2013-05-12 MED ORDER — HYDROMORPHONE HCL PF 1 MG/ML IJ SOLN
1.0000 mg | Freq: Once | INTRAMUSCULAR | Status: AC
  Administered 2013-05-12: 1 mg via INTRAVENOUS
  Filled 2013-05-12: qty 1

## 2013-05-12 NOTE — MAU Note (Signed)
Patient presents with a complaint of being awakened from sleep at 0830 by a sharp, stabbing pain across her lower back that has not yet subsided; patient tearful.

## 2013-05-12 NOTE — MAU Note (Signed)
Pt states here for lower back pain that began this am. Denies uti s/s. No heavy lifting done. States pain is sharp and constant across her whole back.

## 2013-05-12 NOTE — MAU Provider Note (Signed)
History     CSN: [id number]  Arrival date and time: 05/12/13 1005   First Provider Initiated Contact with Patient 05/12/13 1138      Chief Complaint  Patient presents with  . Back Pain   HPI  [name] is 27 y.o. female G1P0 who presents with severe bilateral lower back pain that woke her up out of her sleep at 0800. The patient is rolling back in fourth in the bed, crying stating the pain is severe. The pain is constant, nothing makes it better. The patient was able to walk into MAU. The patient states " I just cant get comfortable".  She has a history of chronic back pain including arthritis. Pt says her back pain has not acted up in a while; she does take flexeril regularly.    OB History   Grav Para Term Preterm Abortions TAB SAB Ect Mult Living   1         0      Past Medical History  Diagnosis Date  . Gastric reflux   . Anxiety   . Basedow's disease   . Pyelonephritis   . History of nephrolithiasis   . Anxiety disorder   . Bipolar disorder   . Interstitial cystitis   . Chronic back pain   . Fibroid     Past Surgical History  Procedure Laterality Date  . Tonsillectomy    . Adenoidectomy    . Tear duct probing      Family History  Problem Relation Age of Onset  . Diabetes    . Hypertension    . Lung cancer    . Bipolar disorder Sister     History  Substance Use Topics  . Smoking status: Current Every Day Smoker -- 0.25 packs/day for 8 years    Types: Cigarettes  . Smokeless tobacco: Never Used  . Alcohol Use: No     Comment: Couple months since last use of alcohol states pt.  Pt reports that she would drink 1/2 of a fifth to take away emotional pain.     Allergies: No Known Allergies  Prescriptions prior to admission  Medication Sig Dispense Refill  . acetaminophen (TYLENOL) 500 MG tablet Take 1,000 mg by mouth every 6 (six) hours as needed.      . cyclobenzaprine (FLEXERIL) 10 MG tablet Take 10 mg by mouth 3 (three) times daily as  needed for muscle spasms.      . nitrofurantoin, macrocrystal-monohydrate, (MACROBID) 100 MG capsule Take 1 capsule (100 mg total) by mouth 2 (two) times daily.  14 capsule  0  . ondansetron (ZOFRAN ODT) 8 MG disintegrating tablet Take 1 tablet (8 mg total) by mouth every 8 (eight) hours as needed for nausea.  20 tablet  0  . phenazopyridine (PYRIDIUM) 200 MG tablet Take 200 mg by mouth 3 (three) times daily as needed for pain.      . Prenatal Vit-Fe Fumarate-FA (PRENATAL MULTIVITAMIN) TABS tablet Take 1 tablet by mouth at bedtime.         Review of Systems  Constitutional: Negative for fever and chills.  Gastrointestinal: Negative for nausea, vomiting, abdominal pain, diarrhea and constipation.  Genitourinary: Negative for dysuria, urgency, frequency and hematuria.  Musculoskeletal: Positive for back pain.  Neurological: Negative for headaches.   Physical Exam   Blood pressure 108/81, pulse 101, temperature 97.6 F (36.4 C), temperature source Oral, resp. rate 20, height 5\' 5"  (1.651 m), weight 79.379 kg (175 lb), last menstrual  period 01/03/2013.  Physical Exam  Constitutional: She is oriented to person, place, and time.  Respiratory: Effort normal.  GI: There is no CVA tenderness.  + bilateral flank pain   Neurological: She is alert and oriented to person, place, and time.  Skin: Skin is warm.  Psychiatric: Her mood appears anxious. She exhibits a depressed mood.   Dilation: closed Effacement: thick  Cervical position: anterior  Station: -3 Presentation: Indeterminate  Exam by: J.Rasch NP   Fetal Tracing: Baseline: 150 bpm difficult to trace due to gestational age.  Variability: minimal-moderate  Accelerations: none Decelerations: none  Toco: None   MAU Course  Procedures None  MDM LR CMP CBC Dilaudid 1 mg IV   Assessment and Plan  Report given to [name] [facility] who resumes care of the patient  1220 - Care assumed from Venia Carbon, NP  [facility] JENNIFER  IRENE 05/12/2013, 11:38 AM   MDM Patient reports improvement in pain with Dilaudid Discussed patient labs and condition with Dr. Senaida Ores. Ok for discharge. Follow-up as scheduled  A: Back pain in pregnancy  P: Discharge home Patient advised to continue Flexeril PRN for pain Patient given work note for today and tomorrow Patient to follow-up in the office as scheduled Patient may return to MAU as needed or if her condition were to change or worsen  Freddi Starr, PA-C 05/12/2013 1:57 PM

## 2013-05-29 ENCOUNTER — Inpatient Hospital Stay (HOSPITAL_COMMUNITY)
Admission: AD | Admit: 2013-05-29 | Discharge: 2013-05-29 | Disposition: A | Discharge status: Home / Self Care | Payer: Medicaid Other | Source: Ambulatory Visit | Attending: Obstetrics and Gynecology | Admitting: Obstetrics and Gynecology

## 2013-05-29 ENCOUNTER — Encounter (HOSPITAL_COMMUNITY): Payer: Self-pay

## 2013-05-29 ENCOUNTER — Inpatient Hospital Stay (HOSPITAL_COMMUNITY): Payer: Medicaid Other

## 2013-05-29 DIAGNOSIS — N390 Urinary tract infection, site not specified: Secondary | ICD-10-CM | POA: Insufficient documentation

## 2013-05-29 DIAGNOSIS — Z87440 Personal history of urinary (tract) infections: Secondary | ICD-10-CM

## 2013-05-29 DIAGNOSIS — Z3490 Encounter for supervision of normal pregnancy, unspecified, unspecified trimester: Secondary | ICD-10-CM

## 2013-05-29 DIAGNOSIS — O2390 Unspecified genitourinary tract infection in pregnancy, unspecified trimester: Secondary | ICD-10-CM | POA: Insufficient documentation

## 2013-05-29 DIAGNOSIS — R1031 Right lower quadrant pain: Secondary | ICD-10-CM | POA: Insufficient documentation

## 2013-05-29 DIAGNOSIS — O212 Late vomiting of pregnancy: Secondary | ICD-10-CM | POA: Insufficient documentation

## 2013-05-29 LAB — URINALYSIS, ROUTINE W REFLEX MICROSCOPIC
Glucose, UA: NEGATIVE mg/dL
Ketones, ur: NEGATIVE mg/dL
Nitrite: NEGATIVE
Protein, ur: NEGATIVE mg/dL
Urobilinogen, UA: 0.2 mg/dL (ref 0.0–1.0)
pH: 7.5 (ref 5.0–8.0)

## 2013-05-29 LAB — CBC
HCT: 36.2 % (ref 36.0–46.0)
MCH: 26.9 pg (ref 26.0–34.0)
MCHC: 32 g/dL (ref 30.0–36.0)
MCV: 84 fL (ref 78.0–100.0)
RDW: 14.5 % (ref 11.5–15.5)
WBC: 10.4 10*3/uL (ref 4.0–10.5)

## 2013-05-29 LAB — URINE MICROSCOPIC-ADD ON

## 2013-05-29 MED ORDER — ONDANSETRON 4 MG PO TBDP: 4.0000 mg | ORAL_TABLET | Freq: Four times a day (QID) | ORAL | Status: DC | PRN

## 2013-05-29 MED ORDER — CYCLOBENZAPRINE HCL 10 MG PO TABS
10.0000 mg | ORAL_TABLET | ORAL | Status: AC
  Administered 2013-05-29: 10 mg via ORAL
  Filled 2013-05-29: qty 1

## 2013-05-29 MED ORDER — CYCLOBENZAPRINE HCL 10 MG PO TABS: 10.0000 mg | ORAL_TABLET | Freq: Three times a day (TID) | ORAL | Status: DC | PRN

## 2013-05-29 MED ORDER — HYDROMORPHONE HCL PF 1 MG/ML IJ SOLN
1.0000 mg | INTRAMUSCULAR | Status: AC
  Administered 2013-05-29: 1 mg via INTRAMUSCULAR
  Filled 2013-05-29: qty 1

## 2013-05-29 MED ORDER — ONDANSETRON 8 MG PO TBDP
8.0000 mg | ORAL_TABLET | ORAL | Status: AC
  Administered 2013-05-29: 8 mg via ORAL
  Filled 2013-05-29: qty 1

## 2013-05-29 NOTE — MAU Note (Signed)
Patient is in with c/o new onset rlq sharp constant pain and n/v since this morning. She denies diarrhea, vaginal bleeding, LOF, cramping. She reports good fetal movement.

## 2013-05-29 NOTE — MAU Note (Signed)
Sharp pain in RLQ, started this morning. Now throwing up, started after pain.

## 2013-05-29 NOTE — MAU Provider Note (Signed)
Chief Complaint:  Abdominal Pain and Emesis   First Provider Initiated Contact with Patient 05/29/13 1149      HPI: [name] is a 27 y.o. G1P0 at [redacted]w[redacted]d who presents to maternity admissions reporting RLQ pain described as sharp and constant starting this morning, followed by nausea and vomiting and sensation of chills.  She reports good fetal movement, denies cramping/contractions, LOF, vaginal bleeding, vaginal itching/burning, urinary symptoms, h/a, dizziness, or fever.  When pt mother arrived to MAU, she reports pt had GI virus ~2 weeks ago with nausea and vomiting and asks if pain could be related to this.  Pt disagrees and states her GI bug was "over a month ago".  The pt mother interrupts to state emphatically that pt GI virus was less than 2 weeks ago.      Past Medical History: Past Medical History  Diagnosis Date  . Gastric reflux   . Anxiety   . Basedow's disease   . Pyelonephritis   . History of nephrolithiasis   . Anxiety disorder   . Bipolar disorder   . Interstitial cystitis   . Chronic back pain   . Fibroid     Past obstetric history: OB History  Gravida Para Term Preterm AB SAB TAB Ectopic Multiple Living  1         0    # Outcome Date GA Lbr Len/2nd Weight Sex Delivery Anes PTL Lv  1 CUR               Past Surgical History: Past Surgical History  Procedure Laterality Date  . Tonsillectomy    . Adenoidectomy    . Tear duct probing      Family History: Family History  Problem Relation Age of Onset  . Diabetes    . Hypertension    . Lung cancer    . Bipolar disorder Sister     Social History: History  Substance Use Topics  . Smoking status: Current Some Day Smoker -- 0.25 packs/day for 8 years    Types: Cigarettes  . Smokeless tobacco: Never Used  . Alcohol Use: No     Comment: Couple months since last use of alcohol states pt.  Pt reports that she would drink 1/2 of a fifth to take away emotional pain.     Allergies: No Known  Allergies  Meds:  Prescriptions prior to admission  Medication Sig Dispense Refill  . nitrofurantoin, macrocrystal-monohydrate, (MACROBID) 100 MG capsule Take 100 mg by mouth daily. Taking to prevent uti      . Phenazopyridine HCl (AZO TABS PO) Take 1 tablet by mouth daily.      . Prenatal Vit-Fe Fumarate-FA (PRENATAL MULTIVITAMIN) TABS tablet Take 1 tablet by mouth at bedtime.         ROS: Pertinent findings in history of present illness.  Physical Exam  Blood pressure 128/68, pulse 96, temperature 98.1 F (36.7 C), temperature source Oral, resp. rate 16, height 5\' 5"  (1.651 m), weight 78.472 kg (173 lb), last menstrual period 01/03/2013, SpO2 97.00%. GENERAL: Well-developed, well-nourished female in no acute distress.  HEENT: normocephalic HEART: normal rate RESP: normal effort ABDOMEN: Soft, gravid appropriate for gestational age, no rebound tenderness but there is guarding during palpation of RLQ and report of significant tenderness, no suprapubic tenderness BACK: Negative CVA tenderness EXTREMITIES: Nontender, no edema NEURO: alert and oriented     FHT:  Baseline 140, moderate variability, accelerations present, no decelerations Contractions: None on toco or to palpation  Labs: Results for orders placed during the hospital encounter of 05/29/13 (from the past 24 hour(s))  URINALYSIS, ROUTINE W REFLEX MICROSCOPIC     Status: Abnormal   Collection Time    05/29/13 10:45 AM      Result Value Range   Color, Urine YELLOW  YELLOW   APPearance HAZY (*) CLEAR   Specific Gravity, Urine 1.010  1.005 - 1.030   pH 7.5  5.0 - 8.0   Glucose, UA NEGATIVE  NEGATIVE mg/dL   Hgb urine dipstick NEGATIVE  NEGATIVE   Bilirubin Urine NEGATIVE  NEGATIVE   Ketones, ur NEGATIVE  NEGATIVE mg/dL   Protein, ur NEGATIVE  NEGATIVE mg/dL   Urobilinogen, UA 0.2  0.0 - 1.0 mg/dL   Nitrite NEGATIVE  NEGATIVE   Leukocytes, UA MODERATE (*) NEGATIVE  URINE MICROSCOPIC-ADD ON     Status: Abnormal    Collection Time    05/29/13 10:45 AM      Result Value Range   Squamous Epithelial / LPF MANY (*) RARE   WBC, UA 3-6  <3 WBC/hpf   Bacteria, UA RARE  RARE  CBC     Status: Abnormal   Collection Time    05/29/13 11:48 AM      Result Value Range   WBC 10.4  4.0 - 10.5 K/uL   RBC 4.31  3.87 - 5.11 MIL/uL   Hemoglobin 11.6 (*) 12.0 - 15.0 g/dL   HCT 40.9  81.1 - 91.4 %   MCV 84.0  78.0 - 100.0 fL   MCH 26.9  26.0 - 34.0 pg   MCHC 32.0  30.0 - 36.0 g/dL   RDW 78.2  95.6 - 21.3 %   Platelets 185  150 - 400 K/uL    Imaging:    US Abdomen Limited  05/29/2013   CLINICAL DATA:  Right lower quadrant pain.  EXAM: LIMITED ABDOMINAL ULTRASOUND  TECHNIQUE: Wallace Cullens scale imaging of the right lower quadrant was performed to evaluate for suspected appendicitis. Standard imaging planes and graded compression technique were utilized.  COMPARISON:  None.  FINDINGS: The appendix is not visualized.  Ancillary findings: None.  Factors affecting image quality: None.  IMPRESSION: Nonvisualization of the appendix.   Electronically Signed   By: Charlett Nose M.D.   On: 05/29/2013 14:09     ED Course Dilaudid 1 mg IM and Zofran 8 mg ODT prior to U/S  When reassessing pt pain after ultrasound, pt reports her pain improved after medication, but pain returned after ultrasound.  After consulting with Dr Senaida Ores and reentering the room to discuss plan of care, pt found to be sobbing into her pillow, curled onto her right side.  Pt mother in room, appears calm, not responding to pt crying.  Pt reports she is crying because of pain.  After Flexeril dose, entered the room to find pt sitting up in bed, sipping on Ginger Ale, smiling, asking if she could have something to eat.    Assessment: 1. RLQ abdominal pain   2. Normal obstetric ultrasound scan   3. Hx: UTI (urinary tract infection)     Plan: Consult Dr Senaida Ores Flexeril 10 mg PO, then see if pt can tolerate PO fluids.  Pt reports improvement in pain  after Flexeril.  D/C home Flexeril 10 mg PO TID PRN Zofran ODT 4 mg Q 8 hours prescribed at pt request Precautions given for pt to return with increase pain, n/v, or fever/chills F/U in office Return to MAU as needed  Follow-up Information   Follow up with Oliver Pila, MD.   Specialty:  Obstetrics and Gynecology   Contact information:   [address], [address] [place] [address] [phone]        Medication List         AZO TABS PO  Take 1 tablet by mouth daily.     cyclobenzaprine 10 MG tablet  Commonly known as:  FLEXERIL  Take 1 tablet (10 mg total) by mouth 3 (three) times daily as needed for muscle spasms.     nitrofurantoin (macrocrystal-monohydrate) 100 MG capsule  Commonly known as:  MACROBID  Take 100 mg by mouth daily. Taking to prevent uti     ondansetron 4 MG disintegrating tablet  Commonly known as:  ZOFRAN ODT  Take 1 tablet (4 mg total) by mouth every 6 (six) hours as needed for nausea.     prenatal multivitamin Tabs tablet  Take 1 tablet by mouth at bedtime.        Sharen Counter Certified Nurse-Midwife 05/29/2013 3:53 PM

## 2013-05-30 LAB — URINE CULTURE

## 2013-05-31 ENCOUNTER — Encounter (HOSPITAL_COMMUNITY): Payer: Self-pay | Admitting: *Deleted

## 2013-05-31 ENCOUNTER — Inpatient Hospital Stay (HOSPITAL_COMMUNITY)
Admission: AD | Admit: 2013-05-31 | Discharge: 2013-05-31 | Disposition: A | Discharge status: Home / Self Care | Payer: Medicaid Other | Source: Ambulatory Visit | Attending: Obstetrics and Gynecology | Admitting: Obstetrics and Gynecology

## 2013-05-31 DIAGNOSIS — R1031 Right lower quadrant pain: Secondary | ICD-10-CM | POA: Insufficient documentation

## 2013-05-31 DIAGNOSIS — R05 Cough: Secondary | ICD-10-CM | POA: Insufficient documentation

## 2013-05-31 DIAGNOSIS — J069 Acute upper respiratory infection, unspecified: Secondary | ICD-10-CM | POA: Insufficient documentation

## 2013-05-31 DIAGNOSIS — R059 Cough, unspecified: Secondary | ICD-10-CM | POA: Insufficient documentation

## 2013-05-31 DIAGNOSIS — R109 Unspecified abdominal pain: Secondary | ICD-10-CM

## 2013-05-31 DIAGNOSIS — O26899 Other specified pregnancy related conditions, unspecified trimester: Secondary | ICD-10-CM

## 2013-05-31 DIAGNOSIS — O99891 Other specified diseases and conditions complicating pregnancy: Secondary | ICD-10-CM | POA: Insufficient documentation

## 2013-05-31 DIAGNOSIS — O9989 Other specified diseases and conditions complicating pregnancy, childbirth and the puerperium: Secondary | ICD-10-CM

## 2013-05-31 LAB — URINALYSIS, ROUTINE W REFLEX MICROSCOPIC
Bilirubin Urine: NEGATIVE
Nitrite: NEGATIVE
Protein, ur: NEGATIVE mg/dL
Specific Gravity, Urine: >1.03 — ABNORMAL HIGH (ref 1.005–1.030)
Urobilinogen, UA: 0.2 mg/dL (ref 0.0–1.0)
pH: 5.5 (ref 5.0–8.0)

## 2013-05-31 LAB — CBC
MCH: 28.3 pg (ref 26.0–34.0)
MCHC: 34 g/dL (ref 30.0–36.0)
MCV: 83.1 fL (ref 78.0–100.0)
Platelets: 175 10*3/uL (ref 150–400)
RBC: 4.56 MIL/uL (ref 3.87–5.11)

## 2013-05-31 LAB — URINE MICROSCOPIC-ADD ON

## 2013-05-31 MED ORDER — ONDANSETRON 8 MG PO TBDP
8.0000 mg | ORAL_TABLET | Freq: Once | ORAL | Status: AC
  Administered 2013-05-31: 8 mg via ORAL
  Filled 2013-05-31: qty 1

## 2013-05-31 MED ORDER — OXYCODONE-ACETAMINOPHEN 5-325 MG PO TABS: 2.0000 | ORAL_TABLET | ORAL | Status: DC | PRN

## 2013-05-31 MED ORDER — OXYCODONE-ACETAMINOPHEN 5-325 MG PO TABS
1.0000 | ORAL_TABLET | Freq: Once | ORAL | Status: AC
  Administered 2013-05-31: 1 via ORAL
  Filled 2013-05-31: qty 1

## 2013-05-31 MED ORDER — DM-GUAIFENESIN ER 30-600 MG PO TB12: 1.0000 | ORAL_TABLET | Freq: Two times a day (BID) | ORAL | Status: DC

## 2013-05-31 MED ORDER — DM-GUAIFENESIN ER 30-600 MG PO TB12
1.0000 | ORAL_TABLET | Freq: Once | ORAL | Status: AC
  Administered 2013-05-31: 1 via ORAL
  Filled 2013-05-31: qty 1

## 2013-05-31 MED ORDER — CYCLOBENZAPRINE HCL 10 MG PO TABS
10.0000 mg | ORAL_TABLET | Freq: Once | ORAL | Status: AC
  Administered 2013-05-31: 10 mg via ORAL
  Filled 2013-05-31: qty 1

## 2013-05-31 NOTE — MAU Note (Signed)
abd pain in RLQ continues. Rested yesterday, tried to go to work today, got sent home. Is nauseous.

## 2013-05-31 NOTE — MAU Provider Note (Signed)
History     CSN: [id number]  Arrival date and time: 05/31/13 1356   First Provider Initiated Contact with Patient 05/31/13 1440      Chief Complaint  Patient presents with  . Abdominal Pain   HPI   Pt is G1P0 @ [redacted]w[redacted]d with continuing RLQ pain.  Pt has not taken any medications today.  Pt was seen here on 05/29/2013 With extensive work up for her pain.  Pt has not taken any Zofran or Flexeril today.  Pt denies spotting or bleeding. Pt went to work but had to come home due to pain. Pt also has dry hacking cough/cold congestion that pt thinks is Contributing to abdominal pain On Rn note: abd pain in RLQ continues. Rested yesterday, tried to go to work today, got sent home. Is nauseous.  Past Medical History  Diagnosis Date  . Gastric reflux   . Anxiety   . Basedow's disease   . Pyelonephritis   . History of nephrolithiasis   . Anxiety disorder   . Bipolar disorder   . Interstitial cystitis   . Chronic back pain   . Fibroid     Past Surgical History  Procedure Laterality Date  . Tonsillectomy    . Adenoidectomy    . Tear duct probing      Family History  Problem Relation Age of Onset  . Diabetes    . Hypertension    . Lung cancer    . Bipolar disorder Sister     History  Substance Use Topics  . Smoking status: Current Some Day Smoker -- 0.25 packs/day for 8 years    Types: Cigarettes  . Smokeless tobacco: Never Used  . Alcohol Use: No     Comment: Couple months since last use of alcohol states pt.  Pt reports that she would drink 1/2 of a fifth to take away emotional pain.     Allergies: No Known Allergies  Prescriptions prior to admission  Medication Sig Dispense Refill  . cyclobenzaprine (FLEXERIL) 10 MG tablet Take 1 tablet (10 mg total) by mouth 3 (three) times daily as needed for muscle spasms.  30 tablet  1  . nitrofurantoin, macrocrystal-monohydrate, (MACROBID) 100 MG capsule Take 100 mg by mouth daily. Taking to prevent uti      . ondansetron  (ZOFRAN ODT) 4 MG disintegrating tablet Take 1 tablet (4 mg total) by mouth every 6 (six) hours as needed for nausea.  20 tablet  2  . Phenazopyridine HCl (AZO TABS PO) Take 1 tablet by mouth daily.      . Prenatal Vit-Fe Fumarate-FA (PRENATAL MULTIVITAMIN) TABS tablet Take 1 tablet by mouth at bedtime.         Review of Systems  Constitutional: Negative for fever and chills.  Gastrointestinal: Positive for nausea and abdominal pain. Negative for diarrhea and constipation.  Genitourinary: Negative for dysuria and urgency.   Physical Exam   Blood pressure 114/88, pulse 98, temperature 97.6 F (36.4 C), temperature source Oral, resp. rate 18, last menstrual period 01/03/2013.  Physical Exam  Nursing note and vitals reviewed. Constitutional: She is oriented to person, place, and time. She appears well-developed and well-nourished. No distress.  HENT:  Head: Normocephalic.  Eyes: Pupils are equal, round, and reactive to light.  Neck: Normal range of motion. Neck supple.  Cardiovascular: Normal rate.   Respiratory: Effort normal.  GI: Soft. Bowel sounds are normal. She exhibits no distension. There is tenderness. There is no rebound and no guarding.  Localized  right lower quadrant pain with tenderness- no rebound  Musculoskeletal: Normal range of motion.  Neurological: She is alert and oriented to person, place, and time.  Skin: Skin is warm and dry.  Psychiatric: She has a normal mood and affect.    MAU Course  Procedures  Grandmother with pt today Discussed URI and round ligament pain and symptomatic relief Flexeril and Zofran given the percocet Discussed with Dr. Jackelyn Knife-  Presumptive round ligament pain with extensive evaluation   Assessment and Plan  abd pain in pregnancy- presumptive round ligament pain- will give Rx for percocet; continue flexeril and zofran  Advised abdominal  URI- Mucinex DM  Toriano Aikey 05/31/2013, 2:43 PM

## 2013-06-20 LAB — OB RESULTS CONSOLE HIV ANTIBODY (ROUTINE TESTING)
HIV: NONREACTIVE
HIV: NONREACTIVE

## 2013-07-11 ENCOUNTER — Inpatient Hospital Stay (HOSPITAL_COMMUNITY)
Admission: AD | Admit: 2013-07-11 | Discharge: 2013-07-11 | Disposition: A | Discharge status: Home / Self Care | Payer: Medicaid Other | Source: Ambulatory Visit | Attending: Obstetrics and Gynecology | Admitting: Obstetrics and Gynecology

## 2013-07-11 ENCOUNTER — Encounter (HOSPITAL_COMMUNITY): Payer: Self-pay | Admitting: *Deleted

## 2013-07-11 DIAGNOSIS — F17200 Nicotine dependence, unspecified, uncomplicated: Secondary | ICD-10-CM | POA: Insufficient documentation

## 2013-07-11 DIAGNOSIS — M549 Dorsalgia, unspecified: Secondary | ICD-10-CM | POA: Insufficient documentation

## 2013-07-11 DIAGNOSIS — M5430 Sciatica, unspecified side: Secondary | ICD-10-CM | POA: Insufficient documentation

## 2013-07-11 DIAGNOSIS — K219 Gastro-esophageal reflux disease without esophagitis: Secondary | ICD-10-CM | POA: Insufficient documentation

## 2013-07-11 LAB — URINALYSIS, ROUTINE W REFLEX MICROSCOPIC
Bilirubin Urine: NEGATIVE
Glucose, UA: NEGATIVE mg/dL
HGB URINE DIPSTICK: NEGATIVE
Ketones, ur: NEGATIVE mg/dL
Leukocytes, UA: NEGATIVE
Nitrite: NEGATIVE
Protein, ur: NEGATIVE mg/dL
Urobilinogen, UA: 0.2 mg/dL (ref 0.0–1.0)
pH: 6.5 (ref 5.0–8.0)

## 2013-07-11 NOTE — MAU Provider Note (Signed)
Chief Complaint:  Back Pain   First Provider Initiated Contact with Patient 07/11/13 1949      HPI: [name] is a 28 y.o. G1P0 at [redacted]w[redacted]d who presents to maternity admissions reporting intermittent back pain that radiates/shoots down her left leg. This pain started yesterday.  She reports good fetal movement, denies regular contractions, LOF, vaginal bleeding, vaginal itching/burning, urinary symptoms, h/a, dizziness, n/v, or fever/chills.     Past Medical History: Past Medical History  Diagnosis Date  . Gastric reflux   . Anxiety   . Basedow's disease   . Pyelonephritis   . History of nephrolithiasis   . Anxiety disorder   . Bipolar disorder   . Interstitial cystitis   . Chronic back pain   . Fibroid     Past obstetric history: OB History  Gravida Para Term Preterm AB SAB TAB Ectopic Multiple Living  1         0    # Outcome Date GA Lbr Len/2nd Weight Sex Delivery Anes PTL Lv  1 CUR               Past Surgical History: Past Surgical History  Procedure Laterality Date  . Tonsillectomy    . Adenoidectomy    . Tear duct probing      Family History: Family History  Problem Relation Age of Onset  . Diabetes    . Hypertension    . Lung cancer    . Bipolar disorder Sister     Social History: History  Substance Use Topics  . Smoking status: Current Some Day Smoker -- 0.25 packs/day for 8 years    Types: Cigarettes  . Smokeless tobacco: Never Used  . Alcohol Use: No     Comment: Couple months since last use of alcohol states pt.  Pt reports that she would drink 1/2 of a fifth to take away emotional pain.     Allergies: No Known Allergies  Meds:  Prescriptions prior to admission  Medication Sig Dispense Refill  . acetaminophen (TYLENOL) 500 MG tablet Take 1,000 mg by mouth daily as needed for moderate pain.      . calcium carbonate (TUMS - DOSED IN MG ELEMENTAL CALCIUM) 500 MG chewable tablet Chew 2 tablets by mouth 3 (three) times daily as needed for  indigestion or heartburn.      . Cranberry-Vitamin C-Probiotic (AZO CRANBERRY PO) Take 1 tablet by mouth 2 (two) times daily as needed (for UTI).      . cyclobenzaprine (FLEXERIL) 10 MG tablet Take 5-10 mg by mouth 3 (three) times daily as needed for muscle spasms.      . nitrofurantoin, macrocrystal-monohydrate, (MACROBID) 100 MG capsule Take 100 mg by mouth daily. Taking to prevent uti      . ondansetron (ZOFRAN ODT) 4 MG disintegrating tablet Take 1 tablet (4 mg total) by mouth every 6 (six) hours as needed for nausea.  20 tablet  2  . Prenatal Vit-Fe Fumarate-FA (PRENATAL MULTIVITAMIN) TABS tablet Take 1 tablet by mouth at bedtime.       . [DISCONTINUED] cyclobenzaprine (FLEXERIL) 10 MG tablet Take 1 tablet (10 mg total) by mouth 3 (three) times daily as needed for muscle spasms.  30 tablet  1    ROS: Pertinent findings in history of present illness.  Physical Exam  Blood pressure 112/65, pulse 96, temperature 98 F (36.7 C), resp. rate 18, last menstrual period 01/03/2013. GENERAL: Well-developed, well-nourished female in no acute distress.  HEENT: normocephalic HEART:  normal rate RESP: normal effort ABDOMEN: Soft, non-tender, gravid appropriate for gestational age EXTREMITIES: Nontender, no edema NEURO: alert and oriented SPECULUM EXAM: NEFG, physiologic discharge, no blood, cervix clean Dilation: Closed Effacement (%): Thick Cervical Position: Posterior Exam by:: L Leftwich Kirby CNM  FHT:  Baseline 145, moderate variability, accelerations present, no decelerations Contractions: None on toco or to palpation   Labs: Results for orders placed during the hospital encounter of 07/11/13 (from the past 24 hour(s))  URINALYSIS, ROUTINE W REFLEX MICROSCOPIC     Status: Abnormal   Collection Time    07/11/13  6:20 PM      Result Value Range   Color, Urine YELLOW  YELLOW   APPearance CLEAR  CLEAR   Specific Gravity, Urine <1.005 (*) 1.005 - 1.030   pH 6.5  5.0 - 8.0   Glucose, UA  NEGATIVE  NEGATIVE mg/dL   Hgb urine dipstick NEGATIVE  NEGATIVE   Bilirubin Urine NEGATIVE  NEGATIVE   Ketones, ur NEGATIVE  NEGATIVE mg/dL   Protein, ur NEGATIVE  NEGATIVE mg/dL   Urobilinogen, UA 0.2  0.0 - 1.0 mg/dL   Nitrite NEGATIVE  NEGATIVE   Leukocytes, UA NEGATIVE  NEGATIVE    Report to Rozelle Logan, PA    Medication List    ASK your doctor about these medications       acetaminophen 500 MG tablet  Commonly known as:  TYLENOL  Take 1,000 mg by mouth daily as needed for moderate pain.     AZO CRANBERRY PO  Take 1 tablet by mouth 2 (two) times daily as needed (for UTI).     calcium carbonate 500 MG chewable tablet  Commonly known as:  TUMS - dosed in mg elemental calcium  Chew 2 tablets by mouth 3 (three) times daily as needed for indigestion or heartburn.     cyclobenzaprine 10 MG tablet  Commonly known as:  FLEXERIL  Take 5-10 mg by mouth 3 (three) times daily as needed for muscle spasms.     nitrofurantoin (macrocrystal-monohydrate) 100 MG capsule  Commonly known as:  MACROBID  Take 100 mg by mouth daily. Taking to prevent uti     ondansetron 4 MG disintegrating tablet  Commonly known as:  ZOFRAN ODT  Take 1 tablet (4 mg total) by mouth every 6 (six) hours as needed for nausea.     prenatal multivitamin Tabs tablet  Take 1 tablet by mouth at bedtime.        Fatima Blank Certified Nurse-Midwife 07/11/2013 8:48 PM  MDM Discussed with Dr. Melba Coon. RICE, heat to the area, hot showers, massage recommended  A: Sciatic nerve pain  P: Discharge home Recommended patient continue previously prescribed pain medications as directed Recommended massage, RICE, heat and hot showers PRN Patient advised to follow-up in the office as scheduled or sooner if symptoms persist or worsen Patient may return to MAU as needed or if her condition were to change or worsen  Farris Has, PA-C 07/11/2013 9:46 PM

## 2013-07-11 NOTE — MAU Note (Signed)
Pt presents with complaints of back pain that started last night and has been on and off all day. Pt denies any vaginal bleeding or leakage of fluid

## 2013-07-11 NOTE — Discharge Instructions (Signed)
Sciatica Sciatica is pain, weakness, numbness, or tingling along your sciatic nerve. The nerve starts in the lower back and runs down the back of each leg. Nerve damage or certain conditions pinch or put pressure on the sciatic nerve. This causes the pain, weakness, and other discomforts of sciatica. HOME CARE   Only take medicine as told by your doctor.  Apply ice to the affected area for 20 minutes. Do this 3 4 times a day for the first 48 72 hours. Then try heat in the same way.  Exercise, stretch, or do your usual activities if these do not make your pain worse.  Go to physical therapy as told by your doctor.  Keep all doctor visits as told.  Do not wear high heels or shoes that are not supportive.  Get a firm mattress if your mattress is too soft to lessen pain and discomfort. GET HELP RIGHT AWAY IF:   You cannot control when you poop (bowel movement) or pee (urinate).  You have more weakness in your lower back, lower belly (pelvis), butt (buttocks), or legs.  You have redness or puffiness (swelling) of your back.  You have a burning feeling when you pee.  You have pain that gets worse when you lie down.  You have pain that wakes you from your sleep.  Your pain is worse than past pain.  Your pain lasts longer than 4 weeks.  You are suddenly losing weight without reason. MAKE SURE YOU:   Understand these instructions.  Will watch this condition.  Will get help right away if you are not doing well or get worse. Document Released: 03/09/2008 Document Revised: 11/30/2011 Document Reviewed: 10/10/2011 [facility] Patient Information 2014 [place]. Back Pain in Pregnancy Back pain during pregnancy is common. It happens in about half of all pregnancies. It is important for you and your baby that you remain active during your pregnancy.If you feel that back pain is not allowing you to remain active or sleep well, it is time to see your caregiver. Back pain may be  caused by several factors related to changes during your pregnancy.Fortunately, unless you had trouble with your back before your pregnancy, the pain is likely to get better after you deliver. Low back pain usually occurs between the fifth and seventh months of pregnancy. It can, however, happen in the first couple months. Factors that increase the risk of back problems include:   Previous back problems.  Injury to your back.  Having twins or multiple births.  A chronic cough.  Stress.  Job-related repetitive motions.  Muscle or spinal disease in the back.  Family history of back problems, ruptured (herniated) discs, or osteoporosis.  Depression, anxiety, and panic attacks. CAUSES   When you are pregnant, your body produces a hormone called relaxin. This hormonemakes the ligaments connecting the low back and pubic bones more flexible. This flexibility allows the baby to be delivered more easily. When your ligaments are loose, your muscles need to work harder to support your back. Soreness in your back can come from tired muscles. Soreness can also come from back tissues that are irritated since they are receiving less support.  As the baby grows, it puts pressure on the nerves and blood vessels in your pelvis. This can cause back pain.  As the baby grows and gets heavier during pregnancy, the uterus pushes the stomach muscles forward and changes your center of gravity. This makes your back muscles work harder to maintain good posture. SYMPTOMS  Lumbar pain during pregnancy Lumbar pain during pregnancy usually occurs at or above the waist in the center of the back. There may be pain and numbness that radiates into your leg or foot. This is similar to low back pain experienced by non-pregnant women. It usually increases with sitting for long periods of time, standing, or repetitive lifting. Tenderness may also be present in the muscles along your upper back. Posterior pelvic pain during  pregnancy Pain in the back of the pelvis is more common than lumbar pain in pregnancy. It is a deep pain felt in your side at the waistline, or across the tailbone (sacrum), or in both places. You may have pain on one or both sides. This pain can also go into the buttocks and backs of the upper thighs. Pubic and groin pain may also be present. The pain does not quickly resolve with rest, and morning stiffness may also be present. Pelvic pain during pregnancy can be brought on by most activities. A high level of fitness before and during pregnancy may or may not prevent this problem. Labor pain is usually 1 to 2 minutes apart, lasts for about 1 minute, and involves a bearing down feeling or pressure in your pelvis. However, if you are at term with the pregnancy, constant low back pain can be the beginning of early labor, and you should be aware of this. DIAGNOSIS  X-rays of the back should not be done during the first 12 to 14 weeks of the pregnancy and only when absolutely necessary during the rest of the pregnancy. MRIs do not give off radiation and are safe during pregnancy. MRIs also should only be done when absolutely necessary. HOME CARE INSTRUCTIONS  Exercise as directed by your caregiver. Exercise is the most effective way to prevent or manage back pain. If you have a back problem, it is especially important to avoid sports that require sudden body movements. Swimming and walking are great activities.  Do not stand in one place for long periods of time.  Do not wear high heels.  Sit in chairs with good posture. Use a pillow on your lower back if necessary. Make sure your head rests over your shoulders and is not hanging forward.  Try sleeping on your side, preferably the left side, with a pillow or two between your legs. If you are sore after a night's rest, your bedmay betoo soft.Try placing a board between your mattress and box spring.  Listen to your body when lifting.If you are  experiencing pain, ask for help or try bending yourknees more so you can use your leg muscles rather than your back muscles. Squat down when picking up something from the floor. Do not bend over.  Eat a healthy diet. Try to gain weight within your caregiver's recommendations.  Use heat or cold packs 3 to 4 times a day for 15 minutes to help with the pain.  Only take over-the-counter or prescription medicines for pain, discomfort, or fever as directed by your caregiver. Sudden (acute) back pain  Use bed rest for only the most extreme, acute episodes of back pain. Prolonged bed rest over 48 hours will aggravate your condition.  Ice is very effective for acute conditions.  Put ice in a plastic bag.  Place a towel between your skin and the bag.  Leave the ice on for 10 to 20 minutes every 2 hours, or as needed.  Using heat packs for 30 minutes prior to activities is also helpful. Continued back  pain See your caregiver if you have continued problems. Your caregiver can help or refer you for appropriate physical therapy. With conditioning, most back problems can be avoided. Sometimes, a more serious issue may be the cause of back pain. You should be seen right away if new problems seem to be developing. Your caregiver may recommend:  A maternity girdle.  An elastic sling.  A back brace.  A massage therapist or acupuncture. SEEK MEDICAL CARE IF:   You are not able to do most of your daily activities, even when taking the pain medicine you were given.  You need a referral to a physical therapist or chiropractor.  You want to try acupuncture. SEEK IMMEDIATE MEDICAL CARE IF:  You develop numbness, tingling, weakness, or problems with the use of your arms or legs.  You develop severe back pain that is no longer relieved with medicines.  You have a sudden change in bowel or bladder control.  You have increasing pain in other areas of the body.  You develop shortness of breath,  dizziness, or fainting.  You develop nausea, vomiting, or sweating.  You have back pain which is similar to labor pains.  You have back pain along with your water breaking or vaginal bleeding.  You have back pain or numbness that travels down your leg.  Your back pain developed after you fell.  You develop pain on one side of your back. You may have a kidney stone.  You see blood in your urine. You may have a bladder infection or kidney stone.  You have back pain with blisters. You may have shingles. Back pain is fairly common during pregnancy but should not be accepted as just part of the process. Back pain should always be treated as soon as possible. This will make your pregnancy as pleasant as possible. Document Released: 09/08/2005 Document Revised: 08/23/2011 Document Reviewed: 10/20/2010 [facility] Patient Information 2014 [place], [place].

## 2013-07-12 ENCOUNTER — Inpatient Hospital Stay (HOSPITAL_COMMUNITY)
Admission: AD | Admit: 2013-07-12 | Discharge: 2013-07-12 | Disposition: A | Discharge status: Home / Self Care | Payer: Medicaid Other | Source: Ambulatory Visit | Attending: Obstetrics and Gynecology | Admitting: Obstetrics and Gynecology

## 2013-07-12 ENCOUNTER — Encounter (HOSPITAL_COMMUNITY): Payer: Self-pay | Admitting: General Practice

## 2013-07-12 DIAGNOSIS — R1084 Generalized abdominal pain: Secondary | ICD-10-CM

## 2013-07-12 DIAGNOSIS — R51 Headache: Secondary | ICD-10-CM | POA: Insufficient documentation

## 2013-07-12 DIAGNOSIS — M545 Low back pain, unspecified: Secondary | ICD-10-CM

## 2013-07-12 DIAGNOSIS — W06XXXA Fall from bed, initial encounter: Secondary | ICD-10-CM | POA: Insufficient documentation

## 2013-07-12 DIAGNOSIS — K219 Gastro-esophageal reflux disease without esophagitis: Secondary | ICD-10-CM | POA: Insufficient documentation

## 2013-07-12 DIAGNOSIS — R109 Unspecified abdominal pain: Secondary | ICD-10-CM | POA: Insufficient documentation

## 2013-07-12 DIAGNOSIS — O99891 Other specified diseases and conditions complicating pregnancy: Secondary | ICD-10-CM | POA: Insufficient documentation

## 2013-07-12 DIAGNOSIS — O99330 Smoking (tobacco) complicating pregnancy, unspecified trimester: Secondary | ICD-10-CM | POA: Insufficient documentation

## 2013-07-12 DIAGNOSIS — Y92009 Unspecified place in unspecified non-institutional (private) residence as the place of occurrence of the external cause: Secondary | ICD-10-CM | POA: Insufficient documentation

## 2013-07-12 DIAGNOSIS — M549 Dorsalgia, unspecified: Secondary | ICD-10-CM | POA: Insufficient documentation

## 2013-07-12 DIAGNOSIS — R519 Headache, unspecified: Secondary | ICD-10-CM

## 2013-07-12 DIAGNOSIS — O9989 Other specified diseases and conditions complicating pregnancy, childbirth and the puerperium: Principal | ICD-10-CM

## 2013-07-12 DIAGNOSIS — W19XXXA Unspecified fall, initial encounter: Secondary | ICD-10-CM

## 2013-07-12 LAB — URINE MICROSCOPIC-ADD ON

## 2013-07-12 LAB — URINALYSIS, ROUTINE W REFLEX MICROSCOPIC
Bilirubin Urine: NEGATIVE
GLUCOSE, UA: NEGATIVE mg/dL
Hgb urine dipstick: NEGATIVE
Ketones, ur: 15 mg/dL — AB
Nitrite: NEGATIVE
Protein, ur: NEGATIVE mg/dL
Specific Gravity, Urine: 1.015 (ref 1.005–1.030)
UROBILINOGEN UA: 0.2 mg/dL (ref 0.0–1.0)
pH: 7.5 (ref 5.0–8.0)

## 2013-07-12 MED ORDER — BUTALBITAL-APAP-CAFFEINE 50-325-40 MG PO TABS
1.0000 | ORAL_TABLET | Freq: Once | ORAL | Status: AC
  Administered 2013-07-12: 1 via ORAL
  Filled 2013-07-12: qty 1

## 2013-07-12 NOTE — MAU Note (Signed)
Getting out of bed, legs buckled and she fell on her abd. Having abd and back pain now. No bleeding.Scared.

## 2013-07-12 NOTE — MAU Provider Note (Signed)
History     CSN: [id number]  Arrival date and time: 07/12/13 1147   First Provider Initiated Contact with Patient 07/12/13 1239      Chief Complaint  Patient presents with  . Fall   HPI This is a 28 y.o. female at [redacted]w[redacted]d who presents with c/o falling as she got out of bed at 11am today. States fell on abdomen. Denies contractions or bleeding. + Fm.   RN Note: Getting out of bed, legs buckled and she fell on her abd. Having abd and back pain now. No bleeding.Scared  OB History   Grav Para Term Preterm Abortions TAB SAB Ect Mult Living   1         0      Past Medical History  Diagnosis Date  . Gastric reflux   . Anxiety   . Basedow's disease   . Pyelonephritis   . History of nephrolithiasis   . Anxiety disorder   . Bipolar disorder   . Interstitial cystitis   . Chronic back pain   . Fibroid     Past Surgical History  Procedure Laterality Date  . Tonsillectomy    . Adenoidectomy    . Tear duct probing      Family History  Problem Relation Age of Onset  . Diabetes    . Hypertension    . Lung cancer    . Bipolar disorder Sister     History  Substance Use Topics  . Smoking status: Current Some Day Smoker -- 0.25 packs/day for 8 years    Types: Cigarettes  . Smokeless tobacco: Never Used  . Alcohol Use: No     Comment: Couple months since last use of alcohol states pt.  Pt reports that she would drink 1/2 of a fifth to take away emotional pain.     Allergies: No Known Allergies  Prescriptions prior to admission  Medication Sig Dispense Refill  . acetaminophen (TYLENOL) 500 MG tablet Take 1,000 mg by mouth daily as needed for moderate pain.      . calcium carbonate (TUMS - DOSED IN MG ELEMENTAL CALCIUM) 500 MG chewable tablet Chew 2 tablets by mouth 3 (three) times daily as needed for indigestion or heartburn.      . cyclobenzaprine (FLEXERIL) 10 MG tablet Take 5-10 mg by mouth 3 (three) times daily as needed for muscle spasms.      . nitrofurantoin,  macrocrystal-monohydrate, (MACROBID) 100 MG capsule Take 100 mg by mouth daily. Taking to prevent uti      . ondansetron (ZOFRAN ODT) 4 MG disintegrating tablet Take 1 tablet (4 mg total) by mouth every 6 (six) hours as needed for nausea.  20 tablet  2  . Phenazopyridine HCl (AZO TABS PO) Take 1 tablet by mouth daily as needed (for UTI symptoms).      . Prenatal Vit-Fe Fumarate-FA (PRENATAL MULTIVITAMIN) TABS tablet Take 1 tablet by mouth at bedtime.         Review of Systems  Constitutional: Negative for fever, chills and malaise/fatigue.  Eyes: Negative for blurred vision and double vision.  Gastrointestinal: Negative for nausea, vomiting, abdominal pain, diarrhea and constipation.  Genitourinary: Negative for dysuria.  Neurological: Positive for headaches. Negative for dizziness and weakness.   Physical Exam   Blood pressure 133/86, pulse 100, temperature 97.6 F (36.4 C), temperature source Oral, resp. rate 18, height 5\' 5"  (1.651 m), weight 80.287 kg (177 lb), last menstrual period 01/03/2013.  Physical Exam  Constitutional: She is oriented  to person, place, and time. She appears well-developed and well-nourished. No distress.  HENT:  Head: Normocephalic.  Cardiovascular: Normal rate.   Respiratory: Effort normal.  GI: Soft. She exhibits no distension. There is no tenderness. There is no rebound and no guarding.  Musculoskeletal: Normal range of motion. She exhibits no edema.  Neurological: She is alert and oriented to person, place, and time.  Skin: Skin is warm and dry.  Psychiatric: She has a normal mood and affect.   Fetal heart rate reactive throughout with no contractions.   MAU Course  Procedures  MDM Monitored for 4 hours post fall. Fioricet given for headache with some relief. Pt states she thinks headache is from being upset and tired. Wants to go home to sleep.   Assessment and Plan  A:  [facility] at [redacted]w[redacted]d       S/P fall       Reassuring fetal heart rate pattern        Likely Stress headache  P  Discharge home      Fetal movement monitoring      Followup in office  [facility] 07/12/2013, 12:49 PM

## 2013-07-12 NOTE — Progress Notes (Signed)
FHT from earlier today reviewed.  Reactive NST, no regular ctx.

## 2013-07-12 NOTE — Discharge Instructions (Signed)
Third Trimester of Pregnancy °The third trimester is from week 29 through week 42, months 7 through 9. The third trimester is a time when the fetus is growing rapidly. At the end of the ninth month, the fetus is about 20 inches in length and weighs 6 10 pounds.  °BODY CHANGES °Your body goes through many changes during pregnancy. The changes vary from woman to woman.  °· Your weight will continue to increase. You can expect to gain 25 35 pounds (11 16 kg) by the end of the pregnancy. °· You may begin to get stretch marks on your hips, abdomen, and breasts. °· You may urinate more often because the fetus is moving lower into your pelvis and pressing on your bladder. °· You may develop or continue to have heartburn as a result of your pregnancy. °· You may develop constipation because certain hormones are causing the muscles that push waste through your intestines to slow down. °· You may develop hemorrhoids or swollen, bulging veins (varicose veins). °· You may have pelvic pain because of the weight gain and pregnancy hormones relaxing your joints between the bones in your pelvis. Back aches may result from over exertion of the muscles supporting your posture. °· Your breasts will continue to grow and be tender. A yellow discharge may leak from your breasts called colostrum. °· Your belly button may stick out. °· You may feel short of breath because of your expanding uterus. °· You may notice the fetus "dropping," or moving lower in your abdomen. °· You may have a bloody mucus discharge. This usually occurs a few days to a week before labor begins. °· Your cervix becomes thin and soft (effaced) near your due date. °WHAT TO EXPECT AT YOUR PRENATAL EXAMS  °You will have prenatal exams every 2 weeks until week 36. Then, you will have weekly prenatal exams. During a routine prenatal visit: °· You will be weighed to make sure you and the fetus are growing normally. °· Your blood pressure is taken. °· Your abdomen will be  measured to track your baby's growth. °· The fetal heartbeat will be listened to. °· Any test results from the previous visit will be discussed. °· You may have a cervical check near your due date to see if you have effaced. °At around 36 weeks, your caregiver will check your cervix. At the same time, your caregiver will also perform a test on the secretions of the vaginal tissue. This test is to determine if a type of bacteria, Group B streptococcus, is present. Your caregiver will explain this further. °Your caregiver may ask you: °· What your birth plan is. °· How you are feeling. °· If you are feeling the baby move. °· If you have had any abnormal symptoms, such as leaking fluid, bleeding, severe headaches, or abdominal cramping. °· If you have any questions. °Other tests or screenings that may be performed during your third trimester include: °· Blood tests that check for low iron levels (anemia). °· Fetal testing to check the health, activity level, and growth of the fetus. Testing is done if you have certain medical conditions or if there are problems during the pregnancy. °FALSE LABOR °You may feel small, irregular contractions that eventually go away. These are called Braxton Hicks contractions, or false labor. Contractions may last for hours, days, or even weeks before true labor sets in. If contractions come at regular intervals, intensify, or become painful, it is best to be seen by your caregiver.  °  SIGNS OF LABOR  °· Menstrual-like cramps. °· Contractions that are 5 minutes apart or less. °· Contractions that start on the top of the uterus and spread down to the lower abdomen and back. °· A sense of increased pelvic pressure or back pain. °· A watery or bloody mucus discharge that comes from the vagina. °If you have any of these signs before the 37th week of pregnancy, call your caregiver right away. You need to go to the hospital to get checked immediately. °HOME CARE INSTRUCTIONS  °· Avoid all  smoking, herbs, alcohol, and unprescribed drugs. These chemicals affect the formation and growth of the baby. °· Follow your caregiver's instructions regarding medicine use. There are medicines that are either safe or unsafe to take during pregnancy. °· Exercise only as directed by your caregiver. Experiencing uterine cramps is a good sign to stop exercising. °· Continue to eat regular, healthy meals. °· Wear a good support bra for breast tenderness. °· Do not use hot tubs, steam rooms, or saunas. °· Wear your seat belt at all times when driving. °· Avoid raw meat, uncooked cheese, cat litter boxes, and soil used by cats. These carry germs that can cause birth defects in the baby. °· Take your prenatal vitamins. °· Try taking a stool softener (if your caregiver approves) if you develop constipation. Eat more high-fiber foods, such as fresh vegetables or fruit and whole grains. Drink plenty of fluids to keep your urine clear or pale yellow. °· Take warm sitz baths to soothe any pain or discomfort caused by hemorrhoids. Use hemorrhoid cream if your caregiver approves. °· If you develop varicose veins, wear support hose. Elevate your feet for 15 minutes, 3 4 times a day. Limit salt in your diet. °· Avoid heavy lifting, wear low heal shoes, and practice good posture. °· Rest a lot with your legs elevated if you have leg cramps or low back pain. °· Visit your dentist if you have not gone during your pregnancy. Use a soft toothbrush to brush your teeth and be gentle when you floss. °· A sexual relationship may be continued unless your caregiver directs you otherwise. °· Do not travel far distances unless it is absolutely necessary and only with the approval of your caregiver. °· Take prenatal classes to understand, practice, and ask questions about the labor and delivery. °· Make a trial run to the hospital. °· Pack your hospital bag. °· Prepare the baby's nursery. °· Continue to go to all your prenatal visits as directed  by your caregiver. °SEEK MEDICAL CARE IF: °· You are unsure if you are in labor or if your water has broken. °· You have dizziness. °· You have mild pelvic cramps, pelvic pressure, or nagging pain in your abdominal area. °· You have persistent nausea, vomiting, or diarrhea. °· You have a bad smelling vaginal discharge. °· You have pain with urination. °SEEK IMMEDIATE MEDICAL CARE IF:  °· You have a fever. °· You are leaking fluid from your vagina. °· You have spotting or bleeding from your vagina. °· You have severe abdominal cramping or pain. °· You have rapid weight loss or gain. °· You have shortness of breath with chest pain. °· You notice sudden or extreme swelling of your face, hands, ankles, feet, or legs. °· You have not felt your baby move in over an hour. °· You have severe headaches that do not go away with medicine. °· You have vision changes. °Document Released: 05/25/2001 Document Revised: 01/31/2013 Document Reviewed:   You have severe abdominal cramping or pain.   You have rapid weight loss or gain.   You have shortness of breath with chest pain.   You notice sudden or extreme swelling of your face, hands, ankles, feet, or legs.   You have not felt your baby move in over an hour.   You have severe headaches that do not go away with medicine.   You have vision changes.  Document Released: 05/25/2001 Document Revised: 01/31/2013 Document Reviewed: 08/01/2012  [employer] Patient Information 2014 [employer].

## 2013-07-13 LAB — URINE CULTURE: Colony Count: 50000

## 2013-08-08 ENCOUNTER — Encounter (HOSPITAL_COMMUNITY): Payer: Self-pay | Admitting: *Deleted

## 2013-08-08 ENCOUNTER — Inpatient Hospital Stay (HOSPITAL_COMMUNITY)
Admission: AD | Admit: 2013-08-08 | Discharge: 2013-08-08 | Disposition: A | Discharge status: Home / Self Care | Payer: Medicaid Other | Source: Ambulatory Visit | Attending: Obstetrics and Gynecology | Admitting: Obstetrics and Gynecology

## 2013-08-08 DIAGNOSIS — O99891 Other specified diseases and conditions complicating pregnancy: Secondary | ICD-10-CM | POA: Insufficient documentation

## 2013-08-08 DIAGNOSIS — M549 Dorsalgia, unspecified: Secondary | ICD-10-CM | POA: Insufficient documentation

## 2013-08-08 DIAGNOSIS — O9989 Other specified diseases and conditions complicating pregnancy, childbirth and the puerperium: Principal | ICD-10-CM

## 2013-08-08 DIAGNOSIS — R109 Unspecified abdominal pain: Secondary | ICD-10-CM | POA: Insufficient documentation

## 2013-08-08 LAB — URINE MICROSCOPIC-ADD ON

## 2013-08-08 LAB — URINALYSIS, ROUTINE W REFLEX MICROSCOPIC
Bilirubin Urine: NEGATIVE
Glucose, UA: NEGATIVE mg/dL
Hgb urine dipstick: NEGATIVE
Ketones, ur: NEGATIVE mg/dL
Nitrite: NEGATIVE
Protein, ur: NEGATIVE mg/dL
Specific Gravity, Urine: <1.005 — ABNORMAL LOW (ref 1.005–1.030)
Urobilinogen, UA: 0.2 mg/dL (ref 0.0–1.0)
pH: 7 (ref 5.0–8.0)

## 2013-08-08 MED ORDER — BUTORPHANOL TARTRATE 1 MG/ML IJ SOLN
1.0000 mg | Freq: Once | INTRAMUSCULAR | Status: AC
  Administered 2013-08-08: 1 mg via INTRAMUSCULAR
  Filled 2013-08-08: qty 1

## 2013-08-08 NOTE — Discharge Instructions (Signed)
Braxton Hicks Contractions Pregnancy is commonly associated with contractions of the uterus throughout the pregnancy. Towards the end of pregnancy (32 to 34 weeks), these contractions [facility] Ishmael Holter) can develop more often and may become more forceful. This is not true labor because these contractions do not result in opening (dilatation) and thinning of the cervix. They are sometimes difficult to tell apart from true labor because these contractions can be forceful and people have different pain tolerances. You should not feel embarrassed if you go to the hospital with false labor. Sometimes, the only way to tell if you are in true labor is for your caregiver to follow the changes in the cervix. How to tell the difference between true and false labor:  False labor.  The contractions of false labor are usually shorter, irregular and not as hard as those of true labor.  They are often felt in the front of the lower abdomen and in the groin.  They may leave with walking around or changing positions while lying down.  They get weaker and are shorter lasting as time goes on.  These contractions are usually irregular.  They do not usually become progressively stronger, regular and closer together as with true labor.  True labor.  Contractions in true labor last 30 to 70 seconds, become very regular, usually become more intense, and increase in frequency.  They do not go away with walking.  The discomfort is usually felt in the top of the uterus and spreads to the lower abdomen and low back.  True labor can be determined by your caregiver with an exam. This will show that the cervix is dilating and getting thinner. If there are no prenatal problems or other health problems associated with the pregnancy, it is completely safe to be sent home with false labor and await the onset of true labor. HOME CARE INSTRUCTIONS   Keep up with your usual exercises and instructions.  Take medications as  directed.  Keep your regular prenatal appointment.  Eat and drink lightly if you think you are going into labor.  If BH contractions are making you uncomfortable:  Change your activity position from lying down or resting to walking/walking to resting.  Sit and rest in a tub of warm water.  Drink 2 to 3 glasses of water. Dehydration may cause B-H contractions.  Do slow and deep breathing several times an hour. SEEK IMMEDIATE MEDICAL CARE IF:   Your contractions continue to become stronger, more regular, and closer together.  You have a gushing, burst or leaking of fluid from the vagina.  An oral temperature above 102 F (38.9 C) develops.  You develop vaginal bleeding.  You develop continuous belly (abdominal) pain.  You have low back pain that you never had before.  You feel the baby's head pushing down causing pelvic pressure.  The baby is not moving as much as it used to. Document Released: 05/31/2005 Document Revised: 08/23/2011 Document Reviewed: 03/12/2013 [facility] Patient Information 2014 [place], [place].

## 2013-08-08 NOTE — MAU Note (Signed)
Patient states she has been having lower abdominal pressure and back pain. Denies bleeding or leaking and reports good fetal movement.

## 2013-08-09 LAB — OB RESULTS CONSOLE GBS: GBS: NEGATIVE

## 2013-08-18 ENCOUNTER — Inpatient Hospital Stay (HOSPITAL_COMMUNITY)
Admission: AD | Admit: 2013-08-18 | Discharge: 2013-08-18 | Disposition: A | Discharge status: Home / Self Care | Payer: Medicaid Other | Source: Ambulatory Visit | Attending: Obstetrics and Gynecology | Admitting: Obstetrics and Gynecology

## 2013-08-18 ENCOUNTER — Encounter (HOSPITAL_COMMUNITY): Payer: Medicaid Other | Admitting: Anesthesiology

## 2013-08-18 ENCOUNTER — Encounter (HOSPITAL_COMMUNITY): Payer: Self-pay | Admitting: *Deleted

## 2013-08-18 ENCOUNTER — Encounter (HOSPITAL_COMMUNITY): Payer: Self-pay

## 2013-08-18 ENCOUNTER — Inpatient Hospital Stay (HOSPITAL_COMMUNITY)
Admission: AD | Admit: 2013-08-18 | Discharge: 2013-08-21 | DRG: 775 | Disposition: A | Discharge status: Home / Self Care | Payer: Medicaid Other | Source: Ambulatory Visit | Attending: Obstetrics and Gynecology | Admitting: Obstetrics and Gynecology

## 2013-08-18 ENCOUNTER — Inpatient Hospital Stay (HOSPITAL_COMMUNITY): Payer: Medicaid Other | Admitting: Anesthesiology

## 2013-08-18 DIAGNOSIS — E079 Disorder of thyroid, unspecified: Secondary | ICD-10-CM | POA: Diagnosis present

## 2013-08-18 DIAGNOSIS — O4290 Premature rupture of membranes, unspecified as to length of time between rupture and onset of labor, unspecified weeks of gestation: Principal | ICD-10-CM | POA: Diagnosis present

## 2013-08-18 DIAGNOSIS — O9989 Other specified diseases and conditions complicating pregnancy, childbirth and the puerperium: Principal | ICD-10-CM

## 2013-08-18 DIAGNOSIS — R109 Unspecified abdominal pain: Secondary | ICD-10-CM | POA: Insufficient documentation

## 2013-08-18 DIAGNOSIS — O26839 Pregnancy related renal disease, unspecified trimester: Secondary | ICD-10-CM | POA: Diagnosis present

## 2013-08-18 DIAGNOSIS — O99891 Other specified diseases and conditions complicating pregnancy: Secondary | ICD-10-CM | POA: Insufficient documentation

## 2013-08-18 DIAGNOSIS — N289 Disorder of kidney and ureter, unspecified: Secondary | ICD-10-CM | POA: Diagnosis present

## 2013-08-18 DIAGNOSIS — K219 Gastro-esophageal reflux disease without esophagitis: Secondary | ICD-10-CM | POA: Diagnosis present

## 2013-08-18 DIAGNOSIS — O99284 Endocrine, nutritional and metabolic diseases complicating childbirth: Secondary | ICD-10-CM

## 2013-08-18 DIAGNOSIS — O99334 Smoking (tobacco) complicating childbirth: Secondary | ICD-10-CM | POA: Diagnosis present

## 2013-08-18 DIAGNOSIS — E0590 Thyrotoxicosis, unspecified without thyrotoxic crisis or storm: Secondary | ICD-10-CM | POA: Diagnosis present

## 2013-08-18 LAB — CBC
HEMATOCRIT: 36.3 % (ref 36.0–46.0)
HEMOGLOBIN: 12.3 g/dL (ref 12.0–15.0)
MCH: 28.6 pg (ref 26.0–34.0)
MCHC: 33.9 g/dL (ref 30.0–36.0)
MCV: 84.4 fL (ref 78.0–100.0)
PLATELETS: 165 10*3/uL (ref 150–400)
RBC: 4.3 MIL/uL (ref 3.87–5.11)
RDW: 14.1 % (ref 11.5–15.5)
WBC: 17.6 10*3/uL — AB (ref 4.0–10.5)

## 2013-08-18 LAB — RPR: RPR Ser Ql: NONREACTIVE

## 2013-08-18 LAB — TYPE AND SCREEN
ABO/RH(D): O POS
Antibody Screen: NEGATIVE

## 2013-08-18 LAB — POCT FERN TEST: POCT Fern Test: POSITIVE

## 2013-08-18 MED ORDER — LIDOCAINE HCL (PF) 1 % IJ SOLN
INTRAMUSCULAR | Status: DC | PRN
Start: 2013-08-18 — End: 2013-08-19
  Administered 2013-08-18 (×2): 4 mL

## 2013-08-18 MED ORDER — FENTANYL 2.5 MCG/ML BUPIVACAINE 1/10 % EPIDURAL INFUSION (WH - ANES): 14.0000 mL/h | INTRAMUSCULAR | Status: DC | PRN

## 2013-08-18 MED ORDER — OXYCODONE-ACETAMINOPHEN 5-325 MG PO TABS: 1.0000 | ORAL_TABLET | ORAL | Status: DC | PRN

## 2013-08-18 MED ORDER — ACETAMINOPHEN 325 MG PO TABS: 650.0000 mg | ORAL_TABLET | ORAL | Status: DC | PRN

## 2013-08-18 MED ORDER — EPHEDRINE 5 MG/ML INJ
10.0000 mg | INTRAVENOUS | Status: DC | PRN
  Filled 2013-08-18: qty 2

## 2013-08-18 MED ORDER — FLEET ENEMA 7-19 GM/118ML RE ENEM: 1.0000 | ENEMA | RECTAL | Status: DC | PRN

## 2013-08-18 MED ORDER — OXYTOCIN 40 UNITS IN LACTATED RINGERS INFUSION - SIMPLE MED
1.0000 m[IU]/min | INTRAVENOUS | Status: DC
  Administered 2013-08-18: 1 m[IU]/min via INTRAVENOUS
  Filled 2013-08-18: qty 1000

## 2013-08-18 MED ORDER — FENTANYL 2.5 MCG/ML BUPIVACAINE 1/10 % EPIDURAL INFUSION (WH - ANES)
INTRAMUSCULAR | Status: AC
  Filled 2013-08-18: qty 125

## 2013-08-18 MED ORDER — IBUPROFEN 600 MG PO TABS: 600.0000 mg | ORAL_TABLET | Freq: Four times a day (QID) | ORAL | Status: DC | PRN

## 2013-08-18 MED ORDER — BUTORPHANOL TARTRATE 1 MG/ML IJ SOLN
INTRAMUSCULAR | Status: AC
  Filled 2013-08-18: qty 1

## 2013-08-18 MED ORDER — LACTATED RINGERS IV SOLN: 500.0000 mL | Freq: Once | INTRAVENOUS | Status: DC

## 2013-08-18 MED ORDER — ONDANSETRON HCL 4 MG/2ML IJ SOLN: 4.0000 mg | Freq: Four times a day (QID) | INTRAMUSCULAR | Status: DC | PRN

## 2013-08-18 MED ORDER — EPHEDRINE 5 MG/ML INJ
INTRAVENOUS | Status: AC
  Filled 2013-08-18: qty 4

## 2013-08-18 MED ORDER — PHENYLEPHRINE 40 MCG/ML (10ML) SYRINGE FOR IV PUSH (FOR BLOOD PRESSURE SUPPORT)
80.0000 ug | PREFILLED_SYRINGE | INTRAVENOUS | Status: DC | PRN
  Filled 2013-08-18: qty 2

## 2013-08-18 MED ORDER — DIPHENHYDRAMINE HCL 50 MG/ML IJ SOLN: 12.5000 mg | INTRAMUSCULAR | Status: DC | PRN

## 2013-08-18 MED ORDER — BUTORPHANOL TARTRATE 1 MG/ML IJ SOLN: 1.0000 mg | Freq: Once | INTRAMUSCULAR | Status: DC

## 2013-08-18 MED ORDER — OXYTOCIN BOLUS FROM INFUSION: 500.0000 mL | INTRAVENOUS | Status: DC

## 2013-08-18 MED ORDER — LACTATED RINGERS IV SOLN
500.0000 mL | INTRAVENOUS | Status: DC | PRN
  Administered 2013-08-18: 1000 mL via INTRAVENOUS

## 2013-08-18 MED ORDER — TERBUTALINE SULFATE 1 MG/ML IJ SOLN: 0.2500 mg | Freq: Once | INTRAMUSCULAR | Status: AC | PRN

## 2013-08-18 MED ORDER — OXYTOCIN 40 UNITS IN LACTATED RINGERS INFUSION - SIMPLE MED: 62.5000 mL/h | INTRAVENOUS | Status: DC

## 2013-08-18 MED ORDER — PHENYLEPHRINE 40 MCG/ML (10ML) SYRINGE FOR IV PUSH (FOR BLOOD PRESSURE SUPPORT)
PREFILLED_SYRINGE | INTRAVENOUS | Status: AC
  Filled 2013-08-18: qty 10

## 2013-08-18 MED ORDER — LACTATED RINGERS IV SOLN
INTRAVENOUS | Status: DC
  Administered 2013-08-18: 20:00:00 via INTRAVENOUS

## 2013-08-18 MED ORDER — FENTANYL 2.5 MCG/ML BUPIVACAINE 1/10 % EPIDURAL INFUSION (WH - ANES)
INTRAMUSCULAR | Status: DC | PRN
  Administered 2013-08-18: 14 mL/h via EPIDURAL

## 2013-08-18 MED ORDER — LIDOCAINE HCL (PF) 1 % IJ SOLN
30.0000 mL | INTRAMUSCULAR | Status: AC | PRN
  Administered 2013-08-19: 30 mL via SUBCUTANEOUS
  Filled 2013-08-18: qty 30

## 2013-08-18 MED ORDER — CITRIC ACID-SODIUM CITRATE 334-500 MG/5ML PO SOLN: 30.0000 mL | ORAL | Status: DC | PRN

## 2013-08-18 NOTE — Anesthesia Procedure Notes (Addendum)
Epidural Patient location during procedure: OB Start time: 08/18/2013 7:35 PM End time: 08/18/2013 7:45 PM  Staffing Anesthesiologist: Nolon Nations R Performed by: anesthesiologist   Preanesthetic Checklist Completed: patient identified, pre-op evaluation, timeout performed, IV checked, risks and benefits discussed and monitors and equipment checked  Epidural Patient position: sitting Prep: site prepped and draped and DuraPrep Patient monitoring: heart rate Approach: midline Location: L2-L3 Injection technique: LOR air and LOR saline  Needle:  Needle type: Tuohy  Needle gauge: 17 G Needle length: 9 cm Needle insertion depth: 6 cm Catheter type: closed end flexible Catheter size: 19 Gauge Catheter at skin depth: 12 cm Test dose: negative  Assessment Sensory level: T8 Events: blood not aspirated, injection not painful, no injection resistance, negative IV test and no paresthesia  Additional Notes Reason for block:procedure for pain

## 2013-08-18 NOTE — H&P (Signed)
[name], TRNKA            ACCOUNT NO.:  [id number]  MEDICAL RECORD NO.:  [id number]  LOCATION:  9167                          FACILITY:  [facility]  PHYSICIAN:  Lucille Passy. Ulanda Edison, M.D. DATE OF BIRTH:  1985-08-05  DATE OF ADMISSION:  08/18/2013 DATE OF DISCHARGE:                             HISTORY & PHYSICAL   HISTORY OF PRESENT ILLNESS:  This is a 28 year old white female, para 0, gravida 1, at 37 weeks and 3 days with an [facility] of September 05, 2013 who is admitted with premature rupture of the membranes.  This patient began her prenatal course in our office on March 12, 2013.  Blood group and type O positive with a negative antibody, rubella immune, RPR nonreactive.  Urine culture positive with E coli.  Urine culture later was negative.  Hepatitis B surface antigen negative, HIV negative, GC and Chlamydia negative.  Cystic fibrosis negative.  First trimester screen negative.  AFP normal.  Repeat HIV and RPR negative.  Group B strep negative.  The patient was treated with Macrobid for the UTI.  She was treated with Rocephin and Bactrim with the urine culture was resistant to both of those.  The patient was given gentamicin for 24- hour loading dose.  Later, the urine culture was negative at 20 weeks. The patient was advised to stay on a daily suppressive dose.  She was later treated with Flexeril for back pain.  The patient called in at 25 weeks  complaining of right lower abdominal pain, rolling on the floor with pain, nausea, and vomiting.  The patient was last seen in our office on August 15, 2013, fundal height was 36 cm.  Overall, weight gain had been about 8 pounds.  AFP screen was negative.  Ultrasound on 04/11/2013 showed an average gestational age of [redacted] weeks 4 days, due date of March 21, but her due date was listed as 325, 1 hour Glucola test was 119.  PAST MEDICAL HISTORY: 1. Anxiety. 2. Depression. 3. Arthritis. 4. Joint pain. 5. Frequent UTIs. 6. Interstitial  cystitis. 7. Pyelonephritis.  ALLERGIES:  NO KNOWN DRUG ALLERGIES.  NO LATEX ALLERGY AND NO FOOD ALLERGIES.  PAST MEDICAL HISTORY: 1. She had myringotomy. 2. Wisdom teeth extraction. 3. T and A.  FAMILY HISTORY:  Father with high cholesterol.  Maternal grandmother with cancer of the lung.  Paternal grandmother  high cholesterol and paternal grandfather with cancer of the lung.  SOCIAL HISTORY:  At the outset of pregnancy, the patient has smoked daily.  She did not drink.  She worked in [employer], single, but in a relationship.  PHYSICAL EXAMINATION:  On admission, the patient came to the hospital earlier today with contractions, but she was not in labor.  She was sent home, but after she went home, she had spontaneous rupture of membranes at about 4 p.m. and came to the hospital.  Ruptured membranes was confirmed. VITAL SIGNS:  Temperature 97.9, pulse 94, respirations 20, blood pressure 116/59. HEART:  Normal size and sounds.  No murmurs. LUNGS:  Clear to auscultation. ABDOMEN:  Soft.  Fundal height on August 15, 2013, 36 cm.  Fetal heart tones normal.  Cervix is 2-3 cm 90-100% effaced, vertex  at a -1 station.  ADMITTING IMPRESSION:  Intrauterine pregnancy at 37 weeks and 3 days, premature rupture of the membranes.  The patient is admitted.  She requested and she will be given an epidural and we will see if progress ensues.  If no progress, we can stimulate labor with Pitocin.     Lucille Passy. Ulanda Edison, M.D.     TFH/MEDQ  D:  08/18/2013  T:  08/18/2013  Job:  [id number]

## 2013-08-18 NOTE — MAU Note (Signed)
Pt presents complaining of lower abdominal cramping that started last night and got worse this am. States she has some pink blood when she wipes. Denies other vaginal discharge and reports good fetal movement.

## 2013-08-18 NOTE — Progress Notes (Signed)
Patient ID: [name], female   DOB: [birth year], 28 y.o.   MRN: [id number] Pitocin at 5u/minute and the contractions are q 2-3 minutes. The FHR is normal and the cervix is 4+ cm 100% effaced and the vertex is at -1/0 station. The cervix has been 4 cm for 2 and 1/2 hours so will increase the pitocin.

## 2013-08-18 NOTE — Progress Notes (Signed)
Received call back about pt. Informed of vaginal exam and reactive baby. Received orders to discharge.

## 2013-08-18 NOTE — Progress Notes (Signed)
Paged to notify of pt arrival in MAU

## 2013-08-18 NOTE — MAU Note (Signed)
Wolbach, RN charge given pt report, pt to go to room 167.

## 2013-08-18 NOTE — MAU Note (Signed)
Pt presents by EMS. Reports SROM @ 1630

## 2013-08-18 NOTE — Anesthesia Preprocedure Evaluation (Addendum)
Anesthesia Evaluation  Patient identified by MRN, date of birth, ID band Patient awake    Reviewed: Allergy & Precautions, H&P , NPO status , Patient's Chart, lab work & pertinent test results  Airway Mallampati: II TM Distance: >3 FB Neck ROM: Full    Dental  (+) Dental Advisory Given   Pulmonary neg pulmonary ROS, Current Smoker,  breath sounds clear to auscultation        Cardiovascular negative cardio ROS  Rhythm:Regular Rate:Normal     Neuro/Psych PSYCHIATRIC DISORDERS Anxiety negative neurological ROS     GI/Hepatic Neg liver ROS, GERD-  ,  Endo/Other  Hyperthyroidism   Renal/GU Renal disease     Musculoskeletal negative musculoskeletal ROS (+)   Abdominal   Peds  Hematology negative hematology ROS (+)   Anesthesia Other Findings   Reproductive/Obstetrics (+) Pregnancy                         Anesthesia Physical Anesthesia Plan  ASA: II  Anesthesia Plan: Epidural   Post-op Pain Management:    Induction:   Airway Management Planned:   Additional Equipment:   Intra-op Plan:   Post-operative Plan:   Informed Consent: I have reviewed the patients History and Physical, chart, labs and discussed the procedure including the risks, benefits and alternatives for the proposed anesthesia with the patient or authorized representative who has indicated his/her understanding and acceptance.     Plan Discussed with:   Anesthesia Plan Comments:         Anesthesia Quick Evaluation

## 2013-08-18 NOTE — Discharge Instructions (Signed)
Fetal Movement Counts Patient Name: __________________________________________________ Patient Due Date: ____________________ Performing a fetal movement count is highly recommended in high-risk pregnancies, but it is good for every pregnant woman to do. Your caregiver may ask you to start counting fetal movements at 28 weeks of the pregnancy. Fetal movements often increase:  After eating a full meal.  After physical activity.  After eating or drinking something sweet or cold.  At rest. Pay attention to when you feel the baby is most active. This will help you notice a pattern of your baby's sleep and wake cycles and what factors contribute to an increase in fetal movement. It is important to perform a fetal movement count at the same time each day when your baby is normally most active.  HOW TO COUNT FETAL MOVEMENTS 1. Find a quiet and comfortable area to sit or lie down on your left side. Lying on your left side provides the best blood and oxygen circulation to your baby. 2. Write down the day and time on a sheet of paper or in a journal. 3. Start counting kicks, flutters, swishes, rolls, or jabs in a 2 hour period. You should feel at least 10 movements within 2 hours. 4. If you do not feel 10 movements in 2 hours, wait 2 3 hours and count again. Look for a change in the pattern or not enough counts in 2 hours. SEEK MEDICAL CARE IF:  You feel less than 10 counts in 2 hours, tried twice.  There is no movement in over an hour.  The pattern is changing or taking longer each day to reach 10 counts in 2 hours.  You feel the baby is not moving as he or she usually does. Date: ____________ Movements: ____________ Start time: ____________ [name] time: ____________  Date: ____________ Movements: ____________ Start time: ____________ [name] time: ____________ Date: ____________ Movements: ____________ Start time: ____________ [name] time: ____________ Date: ____________ Movements: ____________  Start time: ____________ [name] time: ____________ Date: ____________ Movements: ____________ Start time: ____________ [name] time: ____________ Date: ____________ Movements: ____________ Start time: ____________ [name] time: ____________ Date: ____________ Movements: ____________ Start time: ____________ [name] time: ____________ Date: ____________ Movements: ____________ Start time: ____________ [name] time: ____________  Date: ____________ Movements: ____________ Start time: ____________ [name] time: ____________ Date: ____________ Movements: ____________ Start time: ____________ [name] time: ____________ Date: ____________ Movements: ____________ Start time: ____________ [name] time: ____________ Date: ____________ Movements: ____________ Start time: ____________ [name] time: ____________ Date: ____________ Movements: ____________ Start time: ____________ [name] time: ____________ Date: ____________ Movements: ____________ Start time: ____________ [name] time: ____________ Date: ____________ Movements: ____________ Start time: ____________ [name] time: ____________  Date: ____________ Movements: ____________ Start time: ____________ [name] time: ____________ Date: ____________ Movements: ____________ Start time: ____________ [name] time: ____________ Date: ____________ Movements: ____________ Start time: ____________ [name] time: ____________ Date: ____________ Movements: ____________ Start time: ____________ [name] time: ____________ Date: ____________ Movements: ____________ Start time: ____________ [name] time: ____________ Date: ____________ Movements: ____________ Start time: ____________ [name] time: ____________ Date: ____________ Movements: ____________ Start time: ____________ [name] time: ____________  Date: ____________ Movements: ____________ Start time: ____________ [name] time: ____________ Date: ____________ Movements: ____________ Start time: ____________ [name] time:  ____________ Date: ____________ Movements: ____________ Start time: ____________ [name] time: ____________ Date: ____________ Movements: ____________ Start time: ____________ [name] time: ____________ Date: ____________ Movements: ____________ Start time: ____________ [name] time: ____________ Date: ____________ Movements: ____________ Start time: ____________ [name] time: ____________ Date: ____________ Movements: ____________ Start time: ____________ [name] time: ____________  Date: ____________ Movements: ____________ Start time: ____________ [name]  time: ____________ Date: ____________ Movements: ____________ Start time: ____________ [name] time: ____________ Date: ____________ Movements: ____________ Start time: ____________ [name] time: ____________ Date: ____________ Movements: ____________ Start time: ____________ [name] time: ____________ Date: ____________ Movements: ____________ Start time: ____________ [name] time: ____________ Date: ____________ Movements: ____________ Start time: ____________ [name] time: ____________ Date: ____________ Movements: ____________ Start time: ____________ [name] time: ____________  Date: ____________ Movements: ____________ Start time: ____________ [name] time: ____________ Date: ____________ Movements: ____________ Start time: ____________ [name] time: ____________ Date: ____________ Movements: ____________ Start time: ____________ [name] time: ____________ Date: ____________ Movements: ____________ Start time: ____________ [name] time: ____________ Date: ____________ Movements: ____________ Start time: ____________ [name] time: ____________ Date: ____________ Movements: ____________ Start time: ____________ [name] time: ____________ Date: ____________ Movements: ____________ Start time: ____________ [name] time: ____________  Date: ____________ Movements: ____________ Start time: ____________ [name] time: ____________ Date: ____________ Movements:  ____________ Start time: ____________ [name] time: ____________ Date: ____________ Movements: ____________ Start time: ____________ [name] time: ____________ Date: ____________ Movements: ____________ Start time: ____________ [name] time: ____________ Date: ____________ Movements: ____________ Start time: ____________ [name] time: ____________ Date: ____________ Movements: ____________ Start time: ____________ [name] time: ____________ Date: ____________ Movements: ____________ Start time: ____________ [name] time: ____________  Date: ____________ Movements: ____________ Start time: ____________ [name] time: ____________ Date: ____________ Movements: ____________ Start time: ____________ [name] time: ____________ Date: ____________ Movements: ____________ Start time: ____________ [name] time: ____________ Date: ____________ Movements: ____________ Start time: ____________ [name] time: ____________ Date: ____________ Movements: ____________ Start time: ____________ [name] time: ____________ Date: ____________ Movements: ____________ Start time: ____________ [name] time: ____________ Document Released: 06/30/2006 Document Revised: 05/17/2012 Document Reviewed: 03/27/2012 [employer] Patient Information 2014 [place]. Third Trimester of Pregnancy The third trimester is from week 29 through week 42, months 7 through 9. The third trimester is a time when the fetus is growing rapidly. At the end of the ninth month, the fetus is about 20 inches in length and weighs 6 10 pounds.  BODY CHANGES Your body goes through many changes during pregnancy. The changes vary from woman to woman.   Your weight will continue to increase. You can expect to gain 25 35 pounds (11 16 kg) by the end of the pregnancy.  You may begin to get stretch marks on your hips, abdomen, and breasts.  You may urinate more often because the fetus is moving lower into your pelvis and pressing on your bladder.  You may develop or  continue to have heartburn as a result of your pregnancy.  You may develop constipation because certain hormones are causing the muscles that push waste through your intestines to slow down.  You may develop hemorrhoids or swollen, bulging veins (varicose veins).  You may have pelvic pain because of the weight gain and pregnancy hormones relaxing your joints between the bones in your pelvis. Back aches may result from over exertion of the muscles supporting your posture.  Your breasts will continue to grow and be tender. A yellow discharge may leak from your breasts called colostrum.  Your belly button may stick out.  You may feel short of breath because of your expanding uterus.  You may notice the fetus "dropping," or moving lower in your abdomen.  You may have a bloody mucus discharge. This usually occurs a few days to a week before labor begins.  Your cervix becomes thin and soft (effaced) near your due date. WHAT TO EXPECT AT YOUR PRENATAL EXAMS  You will have prenatal exams every 2 weeks until week 36. Then, you will have weekly  prenatal exams. During a routine prenatal visit:  You will be weighed to make sure you and the fetus are growing normally.  Your blood pressure is taken.  Your abdomen will be measured to track your baby's growth.  The fetal heartbeat will be listened to.  Any test results from the previous visit will be discussed.  You may have a cervical check near your due date to see if you have effaced. At around 36 weeks, your caregiver will check your cervix. At the same time, your caregiver will also perform a test on the secretions of the vaginal tissue. This test is to determine if a type of bacteria, Group B streptococcus, is present. Your caregiver will explain this further. Your caregiver may ask you:  What your birth plan is.  How you are feeling.  If you are feeling the baby move.  If you have had any abnormal symptoms, such as leaking fluid,  bleeding, severe headaches, or abdominal cramping.  If you have any questions. Other tests or screenings that may be performed during your third trimester include:  Blood tests that check for low iron levels (anemia).  Fetal testing to check the health, activity level, and growth of the fetus. Testing is done if you have certain medical conditions or if there are problems during the pregnancy. FALSE LABOR You may feel small, irregular contractions that eventually go away. These are called Braxton Hicks contractions, or false labor. Contractions may last for hours, days, or even weeks before true labor sets in. If contractions come at regular intervals, intensify, or become painful, it is best to be seen by your caregiver.  SIGNS OF LABOR   Menstrual-like cramps.  Contractions that are 5 minutes apart or less.  Contractions that start on the top of the uterus and spread down to the lower abdomen and back.  A sense of increased pelvic pressure or back pain.  A watery or bloody mucus discharge that comes from the vagina. If you have any of these signs before the 37th week of pregnancy, call your caregiver right away. You need to go to the hospital to get checked immediately. HOME CARE INSTRUCTIONS   Avoid all smoking, herbs, alcohol, and unprescribed drugs. These chemicals affect the formation and growth of the baby.  Follow your caregiver's instructions regarding medicine use. There are medicines that are either safe or unsafe to take during pregnancy.  Exercise only as directed by your caregiver. Experiencing uterine cramps is a good sign to stop exercising.  Continue to eat regular, healthy meals.  Wear a good support bra for breast tenderness.  Do not use hot tubs, steam rooms, or saunas.  Wear your seat belt at all times when driving.  Avoid raw meat, uncooked cheese, cat litter boxes, and soil used by cats. These carry germs that can cause birth defects in the baby.  Take  your prenatal vitamins.  Try taking a stool softener (if your caregiver approves) if you develop constipation. Eat more high-fiber foods, such as fresh vegetables or fruit and whole grains. Drink plenty of fluids to keep your urine clear or pale yellow.  Take warm sitz baths to soothe any pain or discomfort caused by hemorrhoids. Use hemorrhoid cream if your caregiver approves.  If you develop varicose veins, wear support hose. Elevate your feet for 15 minutes, 3 4 times a day. Limit salt in your diet.  Avoid heavy lifting, wear low heal shoes, and practice good posture.  Rest a lot with your  legs elevated if you have leg cramps or low back pain.  Visit your dentist if you have not gone during your pregnancy. Use a soft toothbrush to brush your teeth and be gentle when you floss.  A sexual relationship may be continued unless your caregiver directs you otherwise.  Do not travel far distances unless it is absolutely necessary and only with the approval of your caregiver.  Take prenatal classes to understand, practice, and ask questions about the labor and delivery.  Make a trial run to the hospital.  Pack your hospital bag.  Prepare the baby's nursery.  Continue to go to all your prenatal visits as directed by your caregiver. SEEK MEDICAL CARE IF:  You are unsure if you are in labor or if your water has broken.  You have dizziness.  You have mild pelvic cramps, pelvic pressure, or nagging pain in your abdominal area.  You have persistent nausea, vomiting, or diarrhea.  You have a bad smelling vaginal discharge.  You have pain with urination. SEEK IMMEDIATE MEDICAL CARE IF:   You have a fever.  You are leaking fluid from your vagina.  You have spotting or bleeding from your vagina.  You have severe abdominal cramping or pain.  You have rapid weight loss or gain.  You have shortness of breath with chest pain.  You notice sudden or extreme swelling of your face,  hands, ankles, feet, or legs.  You have not felt your baby move in over an hour.  You have severe headaches that do not go away with medicine.  You have vision changes. Document Released: 05/25/2001 Document Revised: 01/31/2013 Document Reviewed: 08/01/2012 [facility] Patient Information 2014 [place]. Braxton Hicks Contractions Pregnancy is commonly associated with contractions of the uterus throughout the pregnancy. Towards the end of pregnancy (32 to 34 weeks), these contractions [facility]mael Holter) can develop more often and may become more forceful. This is not true labor because these contractions do not result in opening (dilatation) and thinning of the cervix. They are sometimes difficult to tell apart from true labor because these contractions can be forceful and people have different pain tolerances. You should not feel embarrassed if you go to the hospital with false labor. Sometimes, the only way to tell if you are in true labor is for your caregiver to follow the changes in the cervix. How to tell the difference between true and false labor:  False labor.  The contractions of false labor are usually shorter, irregular and not as hard as those of true labor.  They are often felt in the front of the lower abdomen and in the groin.  They may leave with walking around or changing positions while lying down.  They get weaker and are shorter lasting as time goes on.  These contractions are usually irregular.  They do not usually become progressively stronger, regular and closer together as with true labor.  True labor.  Contractions in true labor last 30 to 70 seconds, become very regular, usually become more intense, and increase in frequency.  They do not go away with walking.  The discomfort is usually felt in the top of the uterus and spreads to the lower abdomen and low back.  True labor can be determined by your caregiver with an exam. This will show that the cervix  is dilating and getting thinner. If there are no prenatal problems or other health problems associated with the pregnancy, it is completely safe to be sent home with false  labor and await the onset of true labor. HOME CARE INSTRUCTIONS   Keep up with your usual exercises and instructions.  Take medications as directed.  Keep your regular prenatal appointment.  Eat and drink lightly if you think you are going into labor.  If BH contractions are making you uncomfortable:  Change your activity position from lying down or resting to walking/walking to resting.  Sit and rest in a tub of warm water.  Drink 2 to 3 glasses of water. Dehydration may cause B-H contractions.  Do slow and deep breathing several times an hour. SEEK IMMEDIATE MEDICAL CARE IF:   Your contractions continue to become stronger, more regular, and closer together.  You have a gushing, burst or leaking of fluid from the vagina.  An oral temperature above 102 F (38.9 C) develops.  You have passage of blood-tinged mucus.  You develop vaginal bleeding.  You develop continuous belly (abdominal) pain.  You have low back pain that you never had before.  You feel the baby's head pushing down causing pelvic pressure.  The baby is not moving as much as it used to. Document Released: 05/31/2005 Document Revised: 08/23/2011 Document Reviewed: 03/12/2013 [facility] Patient Information 2014 [place], [place].

## 2013-08-19 ENCOUNTER — Encounter (HOSPITAL_COMMUNITY): Payer: Self-pay | Admitting: *Deleted

## 2013-08-19 MED ORDER — SENNOSIDES-DOCUSATE SODIUM 8.6-50 MG PO TABS
2.0000 | ORAL_TABLET | ORAL | Status: DC
  Administered 2013-08-20 (×2): 2 via ORAL
  Filled 2013-08-19 (×2): qty 2

## 2013-08-19 MED ORDER — SIMETHICONE 80 MG PO CHEW: 80.0000 mg | CHEWABLE_TABLET | ORAL | Status: DC | PRN

## 2013-08-19 MED ORDER — PRENATAL MULTIVITAMIN CH: 1.0000 | ORAL_TABLET | Freq: Every day | ORAL | Status: DC

## 2013-08-19 MED ORDER — BENZOCAINE-MENTHOL 20-0.5 % EX AERO
1.0000 "application " | INHALATION_SPRAY | CUTANEOUS | Status: DC | PRN
  Administered 2013-08-19: 1 via TOPICAL
  Filled 2013-08-19: qty 56

## 2013-08-19 MED ORDER — MEASLES, MUMPS & RUBELLA VAC ~~LOC~~ INJ
0.5000 mL | INJECTION | Freq: Once | SUBCUTANEOUS | Status: DC
  Filled 2013-08-19: qty 0.5

## 2013-08-19 MED ORDER — PNEUMOCOCCAL VAC POLYVALENT 25 MCG/0.5ML IJ INJ
0.5000 mL | INJECTION | INTRAMUSCULAR | Status: AC
  Administered 2013-08-20: 0.5 mL via INTRAMUSCULAR
  Filled 2013-08-19: qty 0.5

## 2013-08-19 MED ORDER — NITROFURANTOIN MONOHYD MACRO 100 MG PO CAPS
100.0000 mg | ORAL_CAPSULE | Freq: Every day | ORAL | Status: DC
  Administered 2013-08-20 – 2013-08-21 (×2): 100 mg via ORAL
  Filled 2013-08-19 (×3): qty 1

## 2013-08-19 MED ORDER — ONDANSETRON HCL 4 MG/2ML IJ SOLN: 4.0000 mg | INTRAMUSCULAR | Status: DC | PRN

## 2013-08-19 MED ORDER — DIBUCAINE 1 % RE OINT
1.0000 "application " | TOPICAL_OINTMENT | RECTAL | Status: DC | PRN
  Administered 2013-08-20: 1 via RECTAL
  Filled 2013-08-19: qty 28

## 2013-08-19 MED ORDER — DIPHENHYDRAMINE HCL 25 MG PO CAPS: 25.0000 mg | ORAL_CAPSULE | Freq: Four times a day (QID) | ORAL | Status: DC | PRN

## 2013-08-19 MED ORDER — OXYCODONE-ACETAMINOPHEN 5-325 MG PO TABS
1.0000 | ORAL_TABLET | ORAL | Status: DC | PRN
  Administered 2013-08-19 – 2013-08-20 (×7): 1 via ORAL
  Filled 2013-08-19 (×8): qty 1

## 2013-08-19 MED ORDER — TETANUS-DIPHTH-ACELL PERTUSSIS 5-2.5-18.5 LF-MCG/0.5 IM SUSP: 0.5000 mL | Freq: Once | INTRAMUSCULAR | Status: DC

## 2013-08-19 MED ORDER — IBUPROFEN 600 MG PO TABS
600.0000 mg | ORAL_TABLET | Freq: Four times a day (QID) | ORAL | Status: DC
  Administered 2013-08-19 – 2013-08-21 (×8): 600 mg via ORAL
  Filled 2013-08-19 (×7): qty 1

## 2013-08-19 MED ORDER — ONDANSETRON HCL 4 MG PO TABS: 4.0000 mg | ORAL_TABLET | ORAL | Status: DC | PRN

## 2013-08-19 MED ORDER — PRENATAL MULTIVITAMIN CH
1.0000 | ORAL_TABLET | Freq: Every day | ORAL | Status: DC
  Administered 2013-08-19 – 2013-08-20 (×2): 1 via ORAL
  Filled 2013-08-19 (×2): qty 1

## 2013-08-19 MED ORDER — ZOLPIDEM TARTRATE 5 MG PO TABS: 5.0000 mg | ORAL_TABLET | Freq: Every evening | ORAL | Status: DC | PRN

## 2013-08-19 MED ORDER — OXYTOCIN 40 UNITS IN LACTATED RINGERS INFUSION - SIMPLE MED: 62.5000 mL/h | INTRAVENOUS | Status: AC | PRN

## 2013-08-19 MED ORDER — LACTATED RINGERS IV SOLN: INTRAVENOUS | Status: AC

## 2013-08-19 MED ORDER — WITCH HAZEL-GLYCERIN EX PADS
1.0000 "application " | MEDICATED_PAD | CUTANEOUS | Status: DC | PRN
  Administered 2013-08-20: 1 via TOPICAL

## 2013-08-19 MED ORDER — PRENATAL MULTIVITAMIN CH
1.0000 | ORAL_TABLET | Freq: Every day | ORAL | Status: DC
  Administered 2013-08-19: 1 via ORAL
  Filled 2013-08-19: qty 1

## 2013-08-19 MED ORDER — LANOLIN HYDROUS EX OINT: TOPICAL_OINTMENT | CUTANEOUS | Status: DC | PRN

## 2013-08-19 NOTE — Anesthesia Postprocedure Evaluation (Signed)
Anesthesia Post Note  Patient: [name]  Procedure(s) Performed: * No procedures listed *  Anesthesia type: Epidural  Patient location: Mother/Baby  Post pain: Pain level controlled  Post assessment: Post-op Vital signs reviewed  Last Vitals:  Filed Vitals:   08/19/13 0639  BP: 111/65  Pulse: 90  Temp: 36.6 C  Resp: 16    Post vital signs: Reviewed  Level of consciousness: awake  Complications: No apparent anesthesia complications

## 2013-08-19 NOTE — Lactation Note (Signed)
This note was copied from the chart of [place]. Lactation Consultation Note  Patient Name: [name] Today's Date: 08/19/2013 Reason for consult: Initial assessment Mom reports baby has latched well a few times. Baby asleep at this visit. BF basics reviewed. Lactation brochure left for review. Advised of OP services and support group. Advised to ask for assist as needed.   Maternal Data Formula Feeding for Exclusion: No Infant to breast within first hour of birth: Yes Has patient been taught Hand Expression?: Yes Does the patient have breastfeeding experience prior to this delivery?: No  Feeding Feeding Type: Breast Fed Length of feed: 15 min  LATCH Score/Interventions                      Lactation Tools Discussed/Used     Consult Status Consult Status: Follow-up Date: 08/20/13 Follow-up type: In-patient    Katrine Coho 08/19/2013, 10:48 PM

## 2013-08-19 NOTE — Progress Notes (Signed)
Patient ID: [name], female   DOB: 1986-01-01, 28 y.o.   MRN: [id number] DOD VS stable no complaints

## 2013-08-19 NOTE — Progress Notes (Signed)
Care of pt resumed by this nurse

## 2013-08-19 NOTE — Progress Notes (Signed)
Alvan Dame, RN has assumed care of pt.

## 2013-08-19 NOTE — Progress Notes (Signed)
Patient ID: [name], female   DOB: 10/21/1985, 28 y.o.   MRN: [id number] Delivery note:  The pt progressed to full dilatation and pushed well to deliver a living female infant spontaneously LOA over a second degree midline laceration and bilateral labial lacerations. The infant had  Apgars of 9 and 9 at 1 and 5 minutes The placenta was delivered intact and the uterus was normal. The baby smelled foul and there was a possible hypospadius.Rectal was negative. The lacerations were repaired with 3-0 and 2-0 vicryl. The left labial tear was quite large and the right was much smaller but was bleeding so it was repaired also. 1% xylocaine used for the repair EBL 500 cc's.

## 2013-08-19 NOTE — Progress Notes (Signed)
Clinical Social Work Department PSYCHOSOCIAL ASSESSMENT - MATERNAL/CHILD 08/19/2013  Patient:  [name], [name]  Account Number:  [id number]  Admit Date:  08/18/2013  Ardine Eng Name:   undecided at this time    Clinical Social Worker:  Royer Cristobal, LCSW   Date/Time:  08/19/2013 12:30 PM  Date Referred:  08/18/2013   Referral source  Central Nurs[facility]     Referred reason  [place]   Other referral source:    I:  FAMILY / [place] legal guardian:     Other household support members/support persons Other support:    II  PSYCHOSOCIAL DATA Information Source:    Occupational hygienist Employment:   Museum/gallery curator resources:  [employer]:   Other  [place] / Grade:   Maternity Care Coordinator / Child Services Coordination / Early Interventions:  Cultural issues impacting care:    III  STRENGTHS Strengths  Supportive family/friends  Home prepared for Child (including basic supplies)  Adequate Resources   Strength comment:    IV  RISK FACTORS AND CURRENT PROBLEMS Current Problem:       V  SOCIAL WORK ASSESSMENT Acknowledged order for Social Work consult to assess mother's history of bipolar.  Mother was receptive to social work intervention.  Her cousin was present and she requested that she remain in during the assessment.  She is a single parent with no other dependents.  She and newborn will be living with maternal grandmother.    FOB is employed and mother notes that she worked during pregnancy. Mother acknowledged hx of depression.  She denies every being diagnosed with bipolar.  Informed that she was diagnosed a long time ago with depression and placed on medication.  She could not recall exactly when but stated that she was in elementary school.  Informed that 3-4 years ago she was hospitalized in a psychiatric facility for about 4 days due to depression.  Spoke with her regarding her use of alcohol.  She  admits to using alcohol (social drinker-usually beer) prior to becoming aware of the pregnancy and stated that she quit drinking once she found out about the pregnancy. Questioned her about the " of a 5th  to take away the emotional pain" that was noted in her chart, and she denied this.   She denies any current symptoms of depression or anxiety.  Discussed signs/symptoms of PP depression. Provided her with literature and treatment resources if needed.  She denies any of illicit drug use during pregnancy.    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      VI SOCIAL WORK PLAN Social Work Plan  No Further Intervention Required / No Barriers to Discharge

## 2013-08-20 LAB — CBC
HEMATOCRIT: 32 % — AB (ref 36.0–46.0)
Hemoglobin: 10.4 g/dL — ABNORMAL LOW (ref 12.0–15.0)
MCH: 27.8 pg (ref 26.0–34.0)
MCHC: 32.5 g/dL (ref 30.0–36.0)
MCV: 85.6 fL (ref 78.0–100.0)
Platelets: 145 10*3/uL — ABNORMAL LOW (ref 150–400)
RBC: 3.74 MIL/uL — ABNORMAL LOW (ref 3.87–5.11)
RDW: 14.6 % (ref 11.5–15.5)
WBC: 15.9 10*3/uL — ABNORMAL HIGH (ref 4.0–10.5)

## 2013-08-20 MED ORDER — OXYCODONE-ACETAMINOPHEN 5-325 MG PO TABS
1.0000 | ORAL_TABLET | ORAL | Status: DC | PRN
  Administered 2013-08-20: 2 via ORAL
  Administered 2013-08-20: 1 via ORAL
  Administered 2013-08-21 (×2): 2 via ORAL
  Filled 2013-08-20 (×3): qty 2

## 2013-08-20 NOTE — Progress Notes (Signed)
Patient ID: [name], female   DOB: 1986-03-28, 28 y.o.   MRN: [id number] #1 afebrile BP normal HGB stable.

## 2013-08-20 NOTE — Lactation Note (Signed)
This note was copied from the chart of [place]. Lactation Consultation Note: Follow up visit with mom. She reports that baby has been sleepy alot this afternoon. Unwrapped baby, he latched well and nursed for 25 minutes. Mom reports this is the best he has done today Encouragement given Encouraged to watch for feeding cues and encouraged to feed whenever she sees them. Discussed cluster feeding and encouraged to take a nap this afternoon. No questions at present. To call for assist prn  Patient Name: [name] HAFBX'[id number] Date: 08/20/2013 Reason for consult: Follow-up assessment   Maternal Data    Feeding Feeding Type: Breast Fed Length of feed: 25 min  LATCH Score/Interventions Latch: Grasps breast easily, tongue down, lips flanged, rhythmical sucking.  Audible Swallowing: A few with stimulation  Type of Nipple: Everted at rest and after stimulation  Comfort (Breast/Nipple): Soft / non-tender     Hold (Positioning): Assistance needed to correctly position infant at breast and maintain latch. Intervention(s): Breastfeeding basics reviewed;Support Pillows;Skin to skin  LATCH Score: 8  Lactation Tools Discussed/Used     Consult Status Consult Status: Follow-up Date: 08/21/13 Follow-up type: In-patient    Truddie Crumble 08/20/2013, 4:04 PM

## 2013-08-21 MED ORDER — IBUPROFEN 600 MG PO TABS: 600.0000 mg | ORAL_TABLET | Freq: Four times a day (QID) | ORAL | Status: DC | PRN

## 2013-08-21 MED ORDER — OXYCODONE-ACETAMINOPHEN 5-325 MG PO TABS: 1.0000 | ORAL_TABLET | Freq: Four times a day (QID) | ORAL | Status: DC | PRN

## 2013-08-21 NOTE — Progress Notes (Signed)
Patient ID: [name], female   DOB: [birth year], 28 y.o.   MRN: [id number] #2 afebrile BP normal for d/c

## 2013-08-21 NOTE — Discharge Instructions (Signed)
booklet °

## 2013-08-22 NOTE — Progress Notes (Signed)
Post discharge chart review completed.  

## 2013-08-22 NOTE — Discharge Summary (Signed)
NAMEDANIEL, JOHNDROW            ACCOUNT NO.:  [id number]  MEDICAL RECORD NO.:  [id number]  LOCATION:  9107                          FACILITY:  [facility]  PHYSICIAN:  Lucille Passy. Ulanda Edison, M.D. DATE OF BIRTH:  October 01, 1985  DATE OF ADMISSION:  08/18/2013 DATE OF DISCHARGE:  08/21/2013                              DISCHARGE SUMMARY   HOSPITAL COURSE:  This is a 28 year old white female, para 0, gravida 1, at 37 weeks and 3 days with an [facility] of September 05, 2013, admitted with premature rupture of membranes.  The patient had E. coli and UTI early in pregnancy.  She was treated with Macrobid.  Later she was treated with Rocephin and Bactrim, and urine culture was resistant to both of those.  The patient was then given gentamicin for 24-hour loading dose. Urine culture was negative at 20 weeks.  She was treated with Flexeril for back pain.  The patient came to the hospital earlier in the day of admission with contractions, but she was not in labor, she was sent home.  After she went home, she had spontaneous rupture of membranes about 4 p.m., came to the hospital.  Cervix was 2-3 cm 90%, vertex at - 1.  The patient requested and got an epidural.  At 11:32 p.m., the contractions were every 2-3 minutes on 5 milliunits a minute of Pitocin. Cervix was 4+ cm.  The patient progressed to full dilatation with Pitocin, pushed well to deliver a living female infant spontaneously LOA over a second-degree midline laceration and bilateral labial lacerations.  Apgars were 9 and 9 at 1 and 5 minutes.  Placenta delivered intact.  Uterus normal.  The baby smelled foul and there was a possible hypospadias.  Rectal was negative.  Lacerations were repaired with 3-0 and 2-0 Vicryl.  Both labial lacerations were also repaired. Postpartum, the patient did well and was discharged on the second postpartum day.  It was not clear to me if the baby had any penile abnormalities or mass, the patient asked the pediatricians to clear  the baby for circumcision.  The patient plans to do the circumcision in the office.  Her initial hemoglobin 12.3, hematocrit 36.3, white count 17600, platelet count 165,000.  Followup hemoglobin 10.4, RPR nonreactive.  The patient's only major complaint is that she has some back pain.  I felt her back, I found no abnormalities.  I told her that if she continues to have the problem, we will need to have consultation probably with an orthopedist.  FINAL DIAGNOSES:  Intrauterine pregnancy at 37+ weeks, delivered vertex, premature rupture of the membranes.  OPERATION:  Spontaneous delivery, vertex.  Repair of second-degree midline with laceration and bilateral labial lacerations.  FINAL CONDITION:  Improved.  INSTRUCTIONS:  Include our regular discharge instruction booklet as well as the after-visit summary.  The patient is advised to continue her Macrobid once daily as long as she has any, then take prenatal vitamins once a day, starting with discharge and prescriptions for Motrin 600 mg 30 tablets, 1 every 6 hours as needed for pain and Percocet 5/325, 30 tablets, 1 every 6 hours as needed for pain.  These are given at discharge.  The patient is  to return in 6 weeks.     Lucille Passy. Ulanda Edison, M.D.     TFH/MEDQ  D:  08/21/2013  T:  08/22/2013  Job:  [id number]

## 2013-08-28 ENCOUNTER — Ambulatory Visit: Payer: Self-pay

## 2013-08-28 NOTE — Lactation Note (Signed)
This note was copied from the chart of [facility]. Infant Lactation Consultation Outpatient Visit Note  Patient Name: [name] Date of Birth: 08/19/2013 Birth Weight:  6 lb 6.2 oz (2897 g) Gestational Age at Delivery: Gestational Age: [redacted]w[redacted]d Type of Delivery: SVD DISCHARGE WEIGHT: 5-13.7 WEIGHT TODAY: 5-10.1 Breastfeeding History Frequency of Breastfeeding: every 2-3 hours Length of Feeding: 5-15 minutes both sides Voids: qs Stools: 3 green or yellow/24 hours  Supplementing / Method:none Pumping:  Type of Pump:plans to borrow pump but she is not sure what kind   Frequency:  Volume:  none  Comments:    Consultation Evaluation: Mom and 55 day old baby here for feeding assessment.  Baby has had continued weight loss.  Shet felt milk came in and breasts fuller 2 days after discharge,  Observed baby latch to right breast with baby skin to skin in football hold.  Baby latched easily and techniques demonstrated to obtain deeper latch.  Baby nurses actively for 6-8 minutes then becomes sleepy and non-nutritive.  Demonstrated breast massage and compression to mom and baby responded well.  Baby transferred 62 mls from breast and content and relaxed after feeding.  Assisted mom with post pumping with symphony pump.  She pumped a total of 40 mls.  Discussed that weight loss is probably due to short, sleepy feeds.  Mom will work at keeping baby on longer and also using good breast massage.  If baby does not feed well or long enough she will supplement 30-45 mls of expressed breast milk.  Instructed to pump both breasts x 15 minutes after daytime feedings.  Weight check tomorrow with pediatrician.  Mom will check her schedule and schedule lactation follow up next week.  Initial Feeding Assessment:15 minutes right and 6 minutes left Pre-feed RFFMBW:4665 Post-feed LDJTTS:1779 Amount Transferred:62 mls Comments:  Additional Feeding Assessment: Pre-feed Weight: Post-feed Weight: Amount  Transferred: Comments:  Additional Feeding Assessment: Pre-feed Weight: Post-feed Weight: Amount Transferred: Comments:  Total Breast milk Transferred this Visit: 62 mls Total Supplement Given: none  Additional Interventions:   Follow-Up  Mom will call in the AM for follow up in 1 week      [name] 08/28/2013, 10:18 AM

## 2013-09-05 ENCOUNTER — Ambulatory Visit (HOSPITAL_COMMUNITY)
Admission: RE | Admit: 2013-09-05 | Discharge: 2013-09-05 | Disposition: A | Discharge status: Home / Self Care | Payer: Medicaid Other | Source: Ambulatory Visit | Attending: Obstetrics and Gynecology | Admitting: Obstetrics and Gynecology

## 2013-09-05 NOTE — Lactation Note (Addendum)
Infant Lactation Consultation Outpatient Visit Note  Patient Name: [name] Date of Birth: Jun 30, 1985   Gestational Age at Delivery: Gestational Age: <None> Type of Delivery: Vaginal Delivery 37 weeks  BW- 6-6 oz D/C weight - 5-6 oz  1st Dr. Visit - 5-8.3 oz 3/11 2nd Dr. Visit - 5-10 oz 3/13  3rd Dr. Visit - 5-11 oz 3/16 [place] visit -        5-15 oz  3/17 Smart start - 6-2 oz - 3/24  Today's weight - 6-2.8 oz 3/25  Reason for Today's visit - F/U from 3/17 due to low weight gain   Breastfeeding History- per mom  Frequency of Breastfeeding: per mom lately every 1-2 hours and back to 3 hours, no longer than 3 hours.  Length of Feeding: 15- 20 mins , sometime 30 mins. Some feedings feeds one breast and some both.  Mom denies sore nipples or engorgement, but has been having difficulty with swollen nodules . When feeding 1st breast the other breast  Tender. Goes away after feeding.  Voids: 8  Stools: 4 yellowish seedy stools   Supplementing / Method: No supplementing  Pumping:  Type of Pump:  hand pump -  ( received from the smart start RN , lost the cord to the DEBP.) per mom has only pumped at last [place] apt ,    Frequency:  Volume:    Comments: LC recommended checking with cousin for the cord , or call [employer] for a DEBP loaner   Consultation Evaluation: Both breast are full , no engorgement noted, nipples appear healthy pink and no breakdown noted    Initial Feeding Assessment: Pre-feed Weight: 6-2.8 oz 2802 g  Post-feed Weight: 6-3.6 oz 2824 g  Amount Transferred: 78ml  Comments: Latched with depth both breast , consistent pattern with multiply swallows noted. Increased with breast compressions. Per mom comfortable   Additional Feeding Assessment: wet diaper changed  Pre-feed Weight: 6-3.2 oz , 2812g  Post-feed Weight: 6-3.2 oz 2814g  Amount Transferred:2 ml  Comments: re-latched for a few sucks and released   Additional Feeding Assessment: Pre-feed Weight: 6-3.2 oz ,  2814 g  Post-feed Weight: 6-4.3 oz 2844 g  Amount Transferred: 30 ml  Comments:switched to 2nd breast and baby latched with depth . Multiply swallows and increased with breast compressions  Per mom comfortable   Total Breast milk Transferred this Visit: 54 ml ( baby spit a small amount after feeding  Total Supplement Given: none   Lactation Plan of Care -  Praised mom for her efforts breast feeding                                       - Breastfeeding goal - protect established milk supply and increase volume                                       - Steps - feeding baby consistently at the breast and add pumping after 4 -6  feedings a day and PRN ( 10 -15 mins)                                       - Baby to the breast 8 X's a day                                       -  Try doing extra pumping days and evening and not at night unless needed or is only feeds one breast                                       - Important - don't go over 4 hours with out some stimulation to breast , feeding or pumping                                       - keep pumping diary   Follow-Up- PRN and to attend the breast feeding support group next Monday evening 7pm or Tuesday 11 am       Myer Haff 09/05/2013, 10:57 AM

## 2013-09-06 ENCOUNTER — Ambulatory Visit (HOSPITAL_COMMUNITY): Payer: Medicaid Other

## 2013-10-05 ENCOUNTER — Emergency Department (HOSPITAL_COMMUNITY): Payer: Medicaid Other

## 2013-10-05 ENCOUNTER — Encounter (HOSPITAL_COMMUNITY): Payer: Self-pay

## 2013-10-05 ENCOUNTER — Emergency Department (HOSPITAL_COMMUNITY)
Admission: EM | Admit: 2013-10-05 | Discharge: 2013-10-05 | Disposition: A | Discharge status: Home / Self Care | Payer: Medicaid Other | Attending: Emergency Medicine | Admitting: Emergency Medicine

## 2013-10-05 DIAGNOSIS — Z87442 Personal history of urinary calculi: Secondary | ICD-10-CM | POA: Insufficient documentation

## 2013-10-05 DIAGNOSIS — Z87448 Personal history of other diseases of urinary system: Secondary | ICD-10-CM | POA: Insufficient documentation

## 2013-10-05 DIAGNOSIS — Z8659 Personal history of other mental and behavioral disorders: Secondary | ICD-10-CM | POA: Insufficient documentation

## 2013-10-05 DIAGNOSIS — R110 Nausea: Secondary | ICD-10-CM | POA: Insufficient documentation

## 2013-10-05 DIAGNOSIS — Z8639 Personal history of other endocrine, nutritional and metabolic disease: Secondary | ICD-10-CM | POA: Insufficient documentation

## 2013-10-05 DIAGNOSIS — Z792 Long term (current) use of antibiotics: Secondary | ICD-10-CM | POA: Insufficient documentation

## 2013-10-05 DIAGNOSIS — Z8742 Personal history of other diseases of the female genital tract: Secondary | ICD-10-CM | POA: Insufficient documentation

## 2013-10-05 DIAGNOSIS — F17200 Nicotine dependence, unspecified, uncomplicated: Secondary | ICD-10-CM | POA: Insufficient documentation

## 2013-10-05 DIAGNOSIS — R109 Unspecified abdominal pain: Secondary | ICD-10-CM

## 2013-10-05 DIAGNOSIS — Z3202 Encounter for pregnancy test, result negative: Secondary | ICD-10-CM | POA: Insufficient documentation

## 2013-10-05 DIAGNOSIS — G8929 Other chronic pain: Secondary | ICD-10-CM | POA: Insufficient documentation

## 2013-10-05 DIAGNOSIS — Z8719 Personal history of other diseases of the digestive system: Secondary | ICD-10-CM | POA: Insufficient documentation

## 2013-10-05 DIAGNOSIS — Z862 Personal history of diseases of the blood and blood-forming organs and certain disorders involving the immune mechanism: Secondary | ICD-10-CM | POA: Insufficient documentation

## 2013-10-05 DIAGNOSIS — R1031 Right lower quadrant pain: Secondary | ICD-10-CM | POA: Insufficient documentation

## 2013-10-05 LAB — COMPREHENSIVE METABOLIC PANEL
ALK PHOS: 74 U/L (ref 39–117)
ALT: 11 U/L (ref 0–35)
AST: 13 U/L (ref 0–37)
Albumin: 3.7 g/dL (ref 3.5–5.2)
BUN: 8 mg/dL (ref 6–23)
CO2: 24 mEq/L (ref 19–32)
Calcium: 9.3 mg/dL (ref 8.4–10.5)
Chloride: 101 mEq/L (ref 96–112)
Creatinine, Ser: 0.68 mg/dL (ref 0.50–1.10)
GFR calc Af Amer: >90 mL/min (ref >90)
GFR calc non Af Amer: >90 mL/min (ref >90)
Glucose, Bld: 91 mg/dL (ref 70–99)
POTASSIUM: 4.1 meq/L (ref 3.7–5.3)
SODIUM: 139 meq/L (ref 137–147)
TOTAL PROTEIN: 6.7 g/dL (ref 6.0–8.3)
Total Bilirubin: <0.2 mg/dL — ABNORMAL LOW (ref 0.3–1.2)

## 2013-10-05 LAB — URINALYSIS, ROUTINE W REFLEX MICROSCOPIC
Bilirubin Urine: NEGATIVE
GLUCOSE, UA: NEGATIVE mg/dL
Hgb urine dipstick: NEGATIVE
KETONES UR: NEGATIVE mg/dL
LEUKOCYTES UA: NEGATIVE
Nitrite: NEGATIVE
PH: 6 (ref 5.0–8.0)
Protein, ur: NEGATIVE mg/dL
SPECIFIC GRAVITY, URINE: 1.005 (ref 1.005–1.030)
Urobilinogen, UA: 0.2 mg/dL (ref 0.0–1.0)

## 2013-10-05 LAB — CBC WITH DIFFERENTIAL/PLATELET
Basophils Absolute: 0 10*3/uL (ref 0.0–0.1)
Basophils Relative: 0 % (ref 0–1)
EOS ABS: 0.4 10*3/uL (ref 0.0–0.7)
Eosinophils Relative: 4 % (ref 0–5)
HCT: 39.5 % (ref 36.0–46.0)
Hemoglobin: 13 g/dL (ref 12.0–15.0)
Lymphocytes Relative: 38 % (ref 12–46)
Lymphs Abs: 3.9 10*3/uL (ref 0.7–4.0)
MCH: 28 pg (ref 26.0–34.0)
MCHC: 32.9 g/dL (ref 30.0–36.0)
MCV: 84.9 fL (ref 78.0–100.0)
Monocytes Absolute: 0.8 10*3/uL (ref 0.1–1.0)
Monocytes Relative: 8 % (ref 3–12)
NEUTROS PCT: 50 % (ref 43–77)
Neutro Abs: 5.1 10*3/uL (ref 1.7–7.7)
PLATELETS: 270 10*3/uL (ref 150–400)
RBC: 4.65 MIL/uL (ref 3.87–5.11)
RDW: 13.5 % (ref 11.5–15.5)
WBC: 10.3 10*3/uL (ref 4.0–10.5)

## 2013-10-05 LAB — PREGNANCY, URINE: PREG TEST UR: NEGATIVE

## 2013-10-05 MED ORDER — HYDROCODONE-ACETAMINOPHEN 5-325 MG PO TABS: 1.0000 | ORAL_TABLET | Freq: Four times a day (QID) | ORAL | Status: DC | PRN

## 2013-10-05 MED ORDER — ONDANSETRON HCL 4 MG/2ML IJ SOLN
4.0000 mg | Freq: Once | INTRAMUSCULAR | Status: AC
  Administered 2013-10-05: 4 mg via INTRAVENOUS
  Filled 2013-10-05: qty 2

## 2013-10-05 MED ORDER — MORPHINE SULFATE 4 MG/ML IJ SOLN
4.0000 mg | Freq: Once | INTRAMUSCULAR | Status: AC
  Administered 2013-10-05: 4 mg via INTRAVENOUS
  Filled 2013-10-05: qty 1

## 2013-10-05 MED ORDER — IOHEXOL 300 MG/ML  SOLN
50.0000 mL | Freq: Once | INTRAMUSCULAR | Status: AC | PRN
  Administered 2013-10-05: 50 mL via ORAL

## 2013-10-05 MED ORDER — IOHEXOL 300 MG/ML  SOLN
100.0000 mL | Freq: Once | INTRAMUSCULAR | Status: AC | PRN
  Administered 2013-10-05: 100 mL via INTRAVENOUS

## 2013-10-05 MED ORDER — SODIUM CHLORIDE 0.9 % IV BOLUS (SEPSIS)
1000.0000 mL | Freq: Once | INTRAVENOUS | Status: AC
  Administered 2013-10-05: 1000 mL via INTRAVENOUS

## 2013-10-05 MED ORDER — IBUPROFEN 800 MG PO TABS: 800.0000 mg | ORAL_TABLET | Freq: Three times a day (TID) | ORAL | Status: DC | PRN

## 2013-10-05 MED ORDER — ONDANSETRON HCL 4 MG PO TABS: 4.0000 mg | ORAL_TABLET | Freq: Four times a day (QID) | ORAL | Status: DC

## 2013-10-05 NOTE — ED Provider Notes (Signed)
CSN: [id number]     Arrival date & time 10/05/13  7412 History   First MD Initiated Contact with Patient 10/05/13 [phone]     Chief Complaint  Patient presents with  . Abdominal Pain    RLQ     (Consider location/radiation/quality/duration/timing/severity/associated sxs/prior Treatment) HPI History provided by pt.   Pt presents w/ intermittent, non-radiating RLQ throbbing for the past 1.5 weeks.  Felt well before going to bed last night but woke 2 hours ago w/ severe, constant pain w/ associated nausea.  Had some relief earlier in the week with percocet.  Aggravated by palpation and vibration.  Denies fever, anorexia, change in bowels, GU sx.  Has never had pain like this in the past.  Per prior chart, patient was evaluated in [facility] for abd pain in pregnancy in 05/2013 and 07/2013, and pain thought to be secondary to round ligament pain.  Pt reports that current pain is higher and feels different.  Uncomplicated vaginal delivery 08/18/13.  Past Medical History  Diagnosis Date  . Gastric reflux   . Anxiety   . Basedow's disease   . Pyelonephritis   . History of nephrolithiasis   . Anxiety disorder   . Bipolar disorder   . Interstitial cystitis   . Chronic back pain   . Fibroid    Past Surgical History  Procedure Laterality Date  . Tonsillectomy    . Adenoidectomy    . Tear duct probing     Family History  Problem Relation Age of Onset  . Diabetes    . Hypertension    . Lung cancer    . Bipolar disorder Sister    History  Substance Use Topics  . Smoking status: Current Some Day Smoker -- 0.25 packs/day for 8 years    Types: Cigarettes  . Smokeless tobacco: Never Used  . Alcohol Use: No     Comment: Couple months since last use of alcohol states pt.  Pt reports that she would drink 1/2 of a fifth to take away emotional pain.    OB History   Grav Para Term Preterm Abortions TAB SAB Ect Mult Living   1 1 1       1      Review of Systems  All other systems reviewed and are  negative.     Allergies  Review of patient's allergies indicates no known allergies.  Home Medications   Prior to Admission medications   Medication Sig Start Date End Date Taking? Authorizing Provider  ibuprofen (ADVIL,MOTRIN) 600 MG tablet Take 1 tablet (600 mg total) by mouth every 6 (six) hours as needed. 08/21/13   Melina Schools, MD  nitrofurantoin, macrocrystal-monohydrate, (MACROBID) 100 MG capsule Take 100 mg by mouth daily. Taking to prevent uti    Historical Provider, MD  oxyCODONE-acetaminophen (PERCOCET/ROXICET) 5-325 MG per tablet Take 1 tablet by mouth every 6 (six) hours as needed for moderate pain. 08/21/13   Melina Schools, MD  Prenatal Vit-Fe Fumarate-FA (PRENATAL MULTIVITAMIN) TABS tablet Take 1 tablet by mouth at bedtime.     Historical Provider, MD   There were no vitals taken for this visit. Physical Exam  Nursing note and vitals reviewed. Constitutional: She is oriented to person, place, and time. She appears well-developed and well-nourished. No distress.  HENT:  Head: Normocephalic and atraumatic.  Eyes:  Normal appearance  Neck: Normal range of motion.  Cardiovascular: Normal rate and regular rhythm.   Pulmonary/Chest: Effort normal and breath sounds normal. No respiratory distress.  Abdominal: Soft. Bowel sounds are normal. She exhibits no distension and no mass. There is no rebound and no guarding.  RLQ ttp w/ guarding.  Possible peritonitis (pain aggravated by tapping R heel and shaking of hips).  Neg rovsing sign.   Genitourinary:  No CVA tenderness  Musculoskeletal: Normal range of motion.  Neurological: She is alert and oriented to person, place, and time.  Skin: Skin is warm and dry. No rash noted.  Psychiatric: She has a normal mood and affect. Her behavior is normal.    ED Course  Procedures (including critical care time) Labs Review Labs Reviewed  COMPREHENSIVE METABOLIC PANEL - Abnormal; Notable for the following:    Total Bilirubin  <0.2 (*)    All other components within normal limits  URINALYSIS, ROUTINE W REFLEX MICROSCOPIC - Abnormal; Notable for the following:    APPearance CLOUDY (*)    All other components within normal limits  CBC WITH DIFFERENTIAL  PREGNANCY, URINE    Imaging Review No results found.   EKG Interpretation None      MDM   Final diagnoses:  None    27yo F, 7wks PP, presents w/ intermittent RLQ pain and N/V x 1.5 weeks, acutely worsened this morning.  On exam, afebrile, NAD and non-toxic appearing, abd soft/non-distended, RLQ ttp w/ guarding and possible peritonitis.  Labs pending.  CT ordered to r/o acute appendicitis.  IV morphine and zofran ordered for sx.  4:47 AM   Pain improved.  Labs unremarkable.  CT still pending.  Lawyer, PA-C to resume care.     Remer Macho, PA-C 10/05/13 [phone]

## 2013-10-05 NOTE — ED Provider Notes (Signed)
I spoke with Dr. Ulanda Edison of GYN who saw the patient this week we reviewed.  Her test results from today's visit and he felt that no further imaging or testing was needed.  At this point.  He states he would followup with the patient.  The patient is still having pain but is some better. The patient is advised of the results. Told to return here as needed.  Brent General, PA-C 10/05/13 [phone]

## 2013-10-05 NOTE — Discharge Instructions (Signed)
Return here as needed. I spoke with Dr. Daiva Huge and we reviewed your test results. He would like for you to follow up with them. REturn here as needed for any worsening in your condition. This could be an evolving process that has yet to declare itself.

## 2013-10-05 NOTE — ED Provider Notes (Signed)
Medical screening examination/treatment/procedure(s) were performed by non-physician practitioner and as supervising physician I was immediately available for consultation/collaboration.   EKG Interpretation None        Wynetta Fines, MD 10/05/13 [phone]

## 2013-10-05 NOTE — ED Provider Notes (Signed)
Medical screening examination/treatment/procedure(s) were performed by non-physician practitioner and as supervising physician I was immediately available for consultation/collaboration.   EKG Interpretation None        Orpah Greek, MD 10/05/13 1048

## 2013-10-09 ENCOUNTER — Encounter (HOSPITAL_COMMUNITY): Payer: Self-pay | Admitting: Emergency Medicine

## 2013-10-09 ENCOUNTER — Emergency Department (HOSPITAL_COMMUNITY): Payer: Medicaid Other

## 2013-10-09 ENCOUNTER — Emergency Department (HOSPITAL_COMMUNITY)
Admission: EM | Admit: 2013-10-09 | Discharge: 2013-10-09 | Disposition: A | Discharge status: Home / Self Care | Payer: Medicaid Other | Attending: Emergency Medicine | Admitting: Emergency Medicine

## 2013-10-09 DIAGNOSIS — Z8742 Personal history of other diseases of the female genital tract: Secondary | ICD-10-CM | POA: Insufficient documentation

## 2013-10-09 DIAGNOSIS — R109 Unspecified abdominal pain: Secondary | ICD-10-CM

## 2013-10-09 DIAGNOSIS — Z87442 Personal history of urinary calculi: Secondary | ICD-10-CM | POA: Insufficient documentation

## 2013-10-09 DIAGNOSIS — Z862 Personal history of diseases of the blood and blood-forming organs and certain disorders involving the immune mechanism: Secondary | ICD-10-CM | POA: Insufficient documentation

## 2013-10-09 DIAGNOSIS — F17200 Nicotine dependence, unspecified, uncomplicated: Secondary | ICD-10-CM | POA: Insufficient documentation

## 2013-10-09 DIAGNOSIS — Z87448 Personal history of other diseases of urinary system: Secondary | ICD-10-CM | POA: Insufficient documentation

## 2013-10-09 DIAGNOSIS — Z8639 Personal history of other endocrine, nutritional and metabolic disease: Secondary | ICD-10-CM | POA: Insufficient documentation

## 2013-10-09 DIAGNOSIS — G8929 Other chronic pain: Secondary | ICD-10-CM | POA: Insufficient documentation

## 2013-10-09 DIAGNOSIS — R197 Diarrhea, unspecified: Secondary | ICD-10-CM | POA: Insufficient documentation

## 2013-10-09 DIAGNOSIS — R112 Nausea with vomiting, unspecified: Secondary | ICD-10-CM | POA: Insufficient documentation

## 2013-10-09 DIAGNOSIS — Z8719 Personal history of other diseases of the digestive system: Secondary | ICD-10-CM | POA: Insufficient documentation

## 2013-10-09 DIAGNOSIS — Z8659 Personal history of other mental and behavioral disorders: Secondary | ICD-10-CM | POA: Insufficient documentation

## 2013-10-09 LAB — CBC WITH DIFFERENTIAL/PLATELET
Basophils Absolute: 0 10*3/uL (ref 0.0–0.1)
Basophils Relative: 0 % (ref 0–1)
Eosinophils Absolute: 0.2 10*3/uL (ref 0.0–0.7)
Eosinophils Relative: 2 % (ref 0–5)
HCT: 41.7 % (ref 36.0–46.0)
Hemoglobin: 13.3 g/dL (ref 12.0–15.0)
Lymphocytes Relative: 29 % (ref 12–46)
Lymphs Abs: 3 10*3/uL (ref 0.7–4.0)
MCH: 27.2 pg (ref 26.0–34.0)
MCHC: 31.9 g/dL (ref 30.0–36.0)
MCV: 85.3 fL (ref 78.0–100.0)
Monocytes Absolute: 0.8 10*3/uL (ref 0.1–1.0)
Monocytes Relative: 8 % (ref 3–12)
Neutro Abs: 6.2 10*3/uL (ref 1.7–7.7)
Neutrophils Relative %: 61 % (ref 43–77)
Platelets: 295 10*3/uL (ref 150–400)
RBC: 4.89 MIL/uL (ref 3.87–5.11)
RDW: 13.5 % (ref 11.5–15.5)
WBC: 10.1 10*3/uL (ref 4.0–10.5)

## 2013-10-09 LAB — URINE MICROSCOPIC-ADD ON

## 2013-10-09 LAB — URINALYSIS, ROUTINE W REFLEX MICROSCOPIC
Bilirubin Urine: NEGATIVE
Glucose, UA: NEGATIVE mg/dL
Ketones, ur: NEGATIVE mg/dL
Nitrite: NEGATIVE
Protein, ur: NEGATIVE mg/dL
Specific Gravity, Urine: 1.007 (ref 1.005–1.030)
Urobilinogen, UA: 0.2 mg/dL (ref 0.0–1.0)
pH: 6 (ref 5.0–8.0)

## 2013-10-09 LAB — COMPREHENSIVE METABOLIC PANEL
ALT: 12 U/L (ref 0–35)
AST: 15 U/L (ref 0–37)
Albumin: 4.4 g/dL (ref 3.5–5.2)
Alkaline Phosphatase: 76 U/L (ref 39–117)
BUN: 10 mg/dL (ref 6–23)
CO2: 26 mEq/L (ref 19–32)
Calcium: 10 mg/dL (ref 8.4–10.5)
Chloride: 98 mEq/L (ref 96–112)
Creatinine, Ser: 0.74 mg/dL (ref 0.50–1.10)
GFR calc Af Amer: >90 mL/min (ref >90)
GFR calc non Af Amer: >90 mL/min (ref >90)
Glucose, Bld: 100 mg/dL — ABNORMAL HIGH (ref 70–99)
Potassium: 4.5 mEq/L (ref 3.7–5.3)
Sodium: 138 mEq/L (ref 137–147)
Total Bilirubin: <0.2 mg/dL — ABNORMAL LOW (ref 0.3–1.2)
Total Protein: 8 g/dL (ref 6.0–8.3)

## 2013-10-09 LAB — LIPASE, BLOOD: Lipase: 43 U/L (ref 11–59)

## 2013-10-09 MED ORDER — OXYCODONE-ACETAMINOPHEN 5-325 MG PO TABS: 1.0000 | ORAL_TABLET | ORAL | Status: DC | PRN

## 2013-10-09 MED ORDER — ONDANSETRON 4 MG PO TBDP: 4.0000 mg | ORAL_TABLET | Freq: Once | ORAL | Status: DC

## 2013-10-09 MED ORDER — ONDANSETRON 4 MG PO TBDP: 4.0000 mg | ORAL_TABLET | Freq: Three times a day (TID) | ORAL | Status: DC | PRN

## 2013-10-09 MED ORDER — ONDANSETRON HCL 4 MG/2ML IJ SOLN
4.0000 mg | Freq: Once | INTRAMUSCULAR | Status: AC
  Administered 2013-10-09: 4 mg via INTRAVENOUS
  Filled 2013-10-09: qty 2

## 2013-10-09 MED ORDER — OXYCODONE-ACETAMINOPHEN 5-325 MG PO TABS: 2.0000 | ORAL_TABLET | Freq: Once | ORAL | Status: DC

## 2013-10-09 MED ORDER — MORPHINE SULFATE 4 MG/ML IJ SOLN
6.0000 mg | Freq: Once | INTRAMUSCULAR | Status: AC
  Administered 2013-10-09: 6 mg via INTRAVENOUS
  Filled 2013-10-09: qty 2

## 2013-10-09 NOTE — Discharge Instructions (Signed)
Abdominal Pain, Adult °Many things can cause abdominal pain. Usually, abdominal pain is not caused by a disease and will improve without treatment. It can often be observed and treated at home. Your health care provider will do a physical exam and possibly order blood tests and X-rays to help determine the seriousness of your pain. However, in many cases, more time must pass before a clear cause of the pain can be found. Before that point, your health care provider may not know if you need more testing or further treatment. °HOME CARE INSTRUCTIONS  °Monitor your abdominal pain for any changes. The following actions may help to alleviate any discomfort you are experiencing: °· Only take over-the-counter or prescription medicines as directed by your health care provider. °· Do not take laxatives unless directed to do so by your health care provider. °· Try a clear liquid diet (broth, tea, or water) as directed by your health care provider. Slowly move to a bland diet as tolerated. °SEEK MEDICAL CARE IF: °· You have unexplained abdominal pain. °· You have abdominal pain associated with nausea or diarrhea. °· You have pain when you urinate or have a bowel movement. °· You experience abdominal pain that wakes you in the night. °· You have abdominal pain that is worsened or improved by eating food. °· You have abdominal pain that is worsened with eating fatty foods. °SEEK IMMEDIATE MEDICAL CARE IF:  °· Your pain does not go away within 2 hours. °· You have a fever. °· You keep throwing up (vomiting). °· Your pain is felt only in portions of the abdomen, such as the right side or the left lower portion of the abdomen. °· You pass bloody or black tarry stools. °MAKE SURE YOU: °· Understand these instructions.   °· Will watch your condition.   °· Will get help right away if you are not doing well or get worse.   °Document Released: 03/10/2005 Document Revised: 03/21/2013 Document Reviewed: 02/07/2013 °[employer]® Patient  Information ©2014 [employer]. ° °

## 2013-10-09 NOTE — ED Notes (Addendum)
Pt c/o RLQ pain with n/v/d. Pt states that she was seen here on Friday and had multiple tests done and wasn't given a specific diagnoses.  Pt was given Zofran for n/v but not working. Pt states that [place] sent pt here for specialists and Spoke to with charge nurse.   Dr Ulanda Edison told Charge RN he wants her re-worked up, explaining why protocol orders are placed.

## 2013-10-09 NOTE — ED Notes (Signed)
MD at bedside. 

## 2013-10-21 NOTE — ED Provider Notes (Signed)
CSN: [id number]     Arrival date & time 10/09/13  1506 History   First MD Initiated Contact with Patient 10/09/13 1703     Chief Complaint  Patient presents with  . Abdominal Pain  . Nausea  . Emesis  . Diarrhea     (Consider location/radiation/quality/duration/timing/severity/associated sxs/prior Treatment) HPI  28 year old female with intermittent, non-radiating right-sided abdominal pain  for the past 2 weeks. Felt well before going to bed last night but woke 2 hours ago w/ severe, constant pain w/ associated nausea. Had some relief earlier in the week with percocet. Aggravated by palpation and movement. Denies fever, anorexia, change in bowels, GU sx. she was seen in the emergency room 4 days ago and had a fairly unremarkable workup. Her pain has been persistent since her last evaluation.  Past Medical History  Diagnosis Date  . Gastric reflux   . Anxiety   . Basedow's disease   . Pyelonephritis   . History of nephrolithiasis   . Anxiety disorder   . Bipolar disorder   . Interstitial cystitis   . Chronic back pain   . Fibroid    Past Surgical History  Procedure Laterality Date  . Tonsillectomy    . Adenoidectomy    . Tear duct probing     Family History  Problem Relation Age of Onset  . Diabetes    . Hypertension    . Lung cancer    . Bipolar disorder Sister    History  Substance Use Topics  . Smoking status: Current Some Day Smoker -- 0.25 packs/day for 8 years    Types: Cigarettes  . Smokeless tobacco: Never Used  . Alcohol Use: No     Comment: Couple months since last use of alcohol states pt.  Pt reports that she would drink 1/2 of a fifth to take away emotional pain.    OB History   Grav Para Term Preterm Abortions TAB SAB Ect Mult Living   1 1 1       1      Review of Systems  All systems reviewed and negative, other than as noted in HPI.   Allergies  Review of patient's allergies indicates no known allergies.  Home Medications   Prior to  Admission medications   Medication Sig Start Date End Date Taking? Authorizing Provider  HYDROcodone-acetaminophen (NORCO/VICODIN) 5-325 MG per tablet Take 1 tablet by mouth every 6 (six) hours as needed for moderate pain. 10/05/13  Yes Resa Miner Lawyer, PA-C  ibuprofen (ADVIL,MOTRIN) 800 MG tablet Take 1 tablet (800 mg total) by mouth every 8 (eight) hours as needed. 10/05/13  Yes Resa Miner Lawyer, PA-C  ondansetron (ZOFRAN) 4 MG tablet Take 1 tablet (4 mg total) by mouth every 6 (six) hours. 10/05/13  Yes Resa Miner Lawyer, PA-C  ondansetron (ZOFRAN ODT) 4 MG disintegrating tablet Take 1 tablet (4 mg total) by mouth every 8 (eight) hours as needed for nausea or vomiting. 10/09/13   Virgel Manifold, MD  oxyCODONE-acetaminophen (PERCOCET/ROXICET) 5-325 MG per tablet Take 1-2 tablets by mouth every 4 (four) hours as needed for severe pain. 10/09/13   Virgel Manifold, MD   BP 135/83  Pulse 89  Temp(Src) 98.2 F (36.8 C) (Oral)  Resp 16  SpO2 97% Physical Exam  Nursing note and vitals reviewed. Constitutional: She appears well-developed and well-nourished. No distress.  HENT:  Head: Normocephalic and atraumatic.  Eyes: Conjunctivae are normal. Right eye exhibits no discharge. Left eye exhibits no discharge.  Neck: Neck  supple.  Cardiovascular: Normal rate, regular rhythm and normal heart sounds.  Exam reveals no gallop and no friction rub.   No murmur heard. Pulmonary/Chest: Effort normal and breath sounds normal. No respiratory distress.  Abdominal: Soft. She exhibits no distension. There is tenderness.  Right-sided abdominal tenderness. Patient grimaces with palpation, but she does not guard. No distention. No rebound tenderness.  Genitourinary:  No CVA tenderness.  Musculoskeletal: She exhibits no edema and no tenderness.  Neurological: She is alert.  Skin: Skin is warm and dry.  Psychiatric: She has a normal mood and affect. Her behavior is normal. Thought content normal.    ED  Course  Procedures (including critical care time) Labs Review Labs Reviewed  COMPREHENSIVE METABOLIC PANEL - Abnormal; Notable for the following:    Glucose, Bld 100 (*)    Total Bilirubin <0.2 (*)    All other components within normal limits  URINALYSIS, ROUTINE W REFLEX MICROSCOPIC - Abnormal; Notable for the following:    APPearance CLOUDY (*)    Hgb urine dipstick LARGE (*)    Leukocytes, UA TRACE (*)    All other components within normal limits  URINE MICROSCOPIC-ADD ON - Abnormal; Notable for the following:    Squamous Epithelial / LPF MANY (*)    All other components within normal limits  CBC WITH DIFFERENTIAL  LIPASE, BLOOD  POC URINE PREG, ED    Imaging Review No results found.  US Abdomen Complete  10/09/2013   CLINICAL DATA:  Pain.  EXAM: ULTRASOUND ABDOMEN COMPLETE  COMPARISON:  CT ABD/PELVIS W CM dated 10/05/2013  FINDINGS: Gallbladder:  Gallbladder is contracted. No definite gallstones noted. Gallbladder wall thickness is 4 mm, this may be secondary to contracted status post cholecystitis.  Common bile duct:  Diameter: 4 mm.  Liver:  No focal lesion identified. Within normal limits in parenchymal echogenicity.  IVC:  No abnormality visualized.  Pancreas:  Visualized portion unremarkable.  Spleen:  Size and appearance within normal limits.  Right Kidney:  Length: 10.9 cm. Echogenicity within normal limits. No mass or hydronephrosis visualized.  Left Kidney:  Length: 10.5 cm. Echogenicity within normal limits. No mass or hydronephrosis visualized.  Abdominal aorta:  No aneurysm visualized.  Other findings:  None.  IMPRESSION: Contracted gallbladder.  No definite focal abnormalities identified.   Electronically Signed   By: Marcello Moores  Register   On: 10/09/2013 18:06    EKG Interpretation None      MDM   Final diagnoses:  Abdominal pain    28 year old female with abdominal pain. Second ER evaluation in a few days. She has CT which was unremarkable. Ultrasound today does  not reveal any explanatory pathology. Workup otherwise fairly unremarkable. Deficiency for discharge at this time. As needed pain medication. Surgical or gastroenterology followup.    Virgel Manifold, MD 10/21/13 [phone]

## 2013-11-17 ENCOUNTER — Emergency Department (HOSPITAL_COMMUNITY): Payer: Medicaid Other

## 2013-11-17 ENCOUNTER — Emergency Department (HOSPITAL_COMMUNITY)
Admission: EM | Admit: 2013-11-17 | Discharge: 2013-11-17 | Disposition: A | Discharge status: Home / Self Care | Payer: Self-pay | Attending: Emergency Medicine | Admitting: Emergency Medicine

## 2013-11-17 ENCOUNTER — Encounter (HOSPITAL_COMMUNITY): Payer: Self-pay | Admitting: Emergency Medicine

## 2013-11-17 DIAGNOSIS — A499 Bacterial infection, unspecified: Secondary | ICD-10-CM | POA: Insufficient documentation

## 2013-11-17 DIAGNOSIS — Z8659 Personal history of other mental and behavioral disorders: Secondary | ICD-10-CM | POA: Insufficient documentation

## 2013-11-17 DIAGNOSIS — R109 Unspecified abdominal pain: Secondary | ICD-10-CM

## 2013-11-17 DIAGNOSIS — N760 Acute vaginitis: Secondary | ICD-10-CM | POA: Insufficient documentation

## 2013-11-17 DIAGNOSIS — R197 Diarrhea, unspecified: Secondary | ICD-10-CM | POA: Insufficient documentation

## 2013-11-17 DIAGNOSIS — G8929 Other chronic pain: Secondary | ICD-10-CM | POA: Insufficient documentation

## 2013-11-17 DIAGNOSIS — F17200 Nicotine dependence, unspecified, uncomplicated: Secondary | ICD-10-CM | POA: Insufficient documentation

## 2013-11-17 DIAGNOSIS — B9689 Other specified bacterial agents as the cause of diseases classified elsewhere: Secondary | ICD-10-CM | POA: Insufficient documentation

## 2013-11-17 DIAGNOSIS — Z87442 Personal history of urinary calculi: Secondary | ICD-10-CM | POA: Insufficient documentation

## 2013-11-17 DIAGNOSIS — R112 Nausea with vomiting, unspecified: Secondary | ICD-10-CM | POA: Insufficient documentation

## 2013-11-17 DIAGNOSIS — Z3202 Encounter for pregnancy test, result negative: Secondary | ICD-10-CM | POA: Insufficient documentation

## 2013-11-17 DIAGNOSIS — Z79899 Other long term (current) drug therapy: Secondary | ICD-10-CM | POA: Insufficient documentation

## 2013-11-17 DIAGNOSIS — Z8719 Personal history of other diseases of the digestive system: Secondary | ICD-10-CM | POA: Insufficient documentation

## 2013-11-17 LAB — COMPREHENSIVE METABOLIC PANEL
ALT: 11 U/L (ref 0–35)
AST: 14 U/L (ref 0–37)
Albumin: 3.7 g/dL (ref 3.5–5.2)
Alkaline Phosphatase: 56 U/L (ref 39–117)
BILIRUBIN TOTAL: 0.2 mg/dL — AB (ref 0.3–1.2)
BUN: 6 mg/dL (ref 6–23)
CO2: 24 meq/L (ref 19–32)
CREATININE: 0.8 mg/dL (ref 0.50–1.10)
Calcium: 8.7 mg/dL (ref 8.4–10.5)
Chloride: 103 mEq/L (ref 96–112)
GLUCOSE: 102 mg/dL — AB (ref 70–99)
Potassium: 3.7 mEq/L (ref 3.7–5.3)
Sodium: 140 mEq/L (ref 137–147)
Total Protein: 7.4 g/dL (ref 6.0–8.3)

## 2013-11-17 LAB — URINALYSIS, ROUTINE W REFLEX MICROSCOPIC
BILIRUBIN URINE: NEGATIVE
Glucose, UA: NEGATIVE mg/dL
KETONES UR: NEGATIVE mg/dL
LEUKOCYTES UA: NEGATIVE
NITRITE: NEGATIVE
PH: 7 (ref 5.0–8.0)
PROTEIN: NEGATIVE mg/dL
Specific Gravity, Urine: 1.021 (ref 1.005–1.030)
Urobilinogen, UA: 1 mg/dL (ref 0.0–1.0)

## 2013-11-17 LAB — CBC WITH DIFFERENTIAL/PLATELET
BASOS ABS: 0 10*3/uL (ref 0.0–0.1)
Basophils Relative: 0 % (ref 0–1)
Eosinophils Absolute: 0.1 10*3/uL (ref 0.0–0.7)
Eosinophils Relative: 1 % (ref 0–5)
HEMATOCRIT: 42.1 % (ref 36.0–46.0)
HEMOGLOBIN: 13.6 g/dL (ref 12.0–15.0)
LYMPHS PCT: 23 % (ref 12–46)
Lymphs Abs: 2.6 10*3/uL (ref 0.7–4.0)
MCH: 27.7 pg (ref 26.0–34.0)
MCHC: 32.3 g/dL (ref 30.0–36.0)
MCV: 85.7 fL (ref 78.0–100.0)
MONO ABS: 0.9 10*3/uL (ref 0.1–1.0)
Monocytes Relative: 8 % (ref 3–12)
Neutro Abs: 7.6 10*3/uL (ref 1.7–7.7)
Neutrophils Relative %: 68 % (ref 43–77)
Platelets: 253 10*3/uL (ref 150–400)
RBC: 4.91 MIL/uL (ref 3.87–5.11)
RDW: 13.8 % (ref 11.5–15.5)
WBC: 11.2 10*3/uL — AB (ref 4.0–10.5)

## 2013-11-17 LAB — WET PREP, GENITAL
TRICH WET PREP: NONE SEEN
YEAST WET PREP: NONE SEEN

## 2013-11-17 LAB — URINE MICROSCOPIC-ADD ON

## 2013-11-17 LAB — LIPASE, BLOOD: Lipase: 25 U/L (ref 11–59)

## 2013-11-17 LAB — POC URINE PREG, ED: PREG TEST UR: NEGATIVE

## 2013-11-17 MED ORDER — METRONIDAZOLE 500 MG PO TABS: 500.0000 mg | ORAL_TABLET | Freq: Two times a day (BID) | ORAL | Status: DC

## 2013-11-17 MED ORDER — ONDANSETRON HCL 4 MG/2ML IJ SOLN
4.0000 mg | Freq: Once | INTRAMUSCULAR | Status: AC
  Administered 2013-11-17: 4 mg via INTRAVENOUS
  Filled 2013-11-17: qty 2

## 2013-11-17 MED ORDER — HYDROMORPHONE HCL PF 1 MG/ML IJ SOLN
1.0000 mg | Freq: Once | INTRAMUSCULAR | Status: AC
  Administered 2013-11-17: 1 mg via INTRAVENOUS
  Filled 2013-11-17: qty 1

## 2013-11-17 MED ORDER — ONDANSETRON HCL 4 MG PO TABS: 4.0000 mg | ORAL_TABLET | Freq: Four times a day (QID) | ORAL | Status: DC

## 2013-11-17 MED ORDER — OXYCODONE-ACETAMINOPHEN 5-325 MG PO TABS: 1.0000 | ORAL_TABLET | ORAL | Status: DC | PRN

## 2013-11-17 NOTE — Discharge Instructions (Signed)
Abdominal Pain, Women °Abdominal (stomach, pelvic, or belly) pain can be caused by many things. It is important to tell your doctor: °· The location of the pain. °· Does it come and go or is it present all the time? °· Are there things that start the pain (eating certain foods, exercise)? °· Are there other symptoms associated with the pain (fever, nausea, vomiting, diarrhea)? °All of this is helpful to know when trying to find the cause of the pain. °CAUSES  °· Stomach: virus or bacteria infection, or ulcer. °· Intestine: appendicitis (inflamed appendix), regional ileitis (Crohn's disease), ulcerative colitis (inflamed colon), irritable bowel syndrome, diverticulitis (inflamed diverticulum of the colon), or cancer of the stomach or intestine. °· Gallbladder disease or stones in the gallbladder. °· Kidney disease, kidney stones, or infection. °· Pancreas infection or cancer. °· Fibromyalgia (pain disorder). °· Diseases of the female organs: °· Uterus: fibroid (non-cancerous) tumors or infection. °· Fallopian tubes: infection or tubal pregnancy. °· Ovary: cysts or tumors. °· Pelvic adhesions (scar tissue). °· Endometriosis (uterus lining tissue growing in the pelvis and on the pelvic organs). °· Pelvic congestion syndrome (female organs filling up with blood just before the menstrual period). °· Pain with the menstrual period. °· Pain with ovulation (producing an egg). °· Pain with an IUD (intrauterine device, birth control) in the uterus. °· Cancer of the female organs. °· Functional pain (pain not caused by a disease, may improve without treatment). °· Psychological pain. °· Depression. °DIAGNOSIS  °Your doctor will decide the seriousness of your pain by doing an examination. °· Blood tests. °· X-rays. °· Ultrasound. °· CT scan (computed tomography, special type of X-ray). °· MRI (magnetic resonance imaging). °· Cultures, for infection. °· Barium enema (dye inserted in the large intestine, to better view it with  X-rays). °· Colonoscopy (looking in intestine with a lighted tube). °· Laparoscopy (minor surgery, looking in abdomen with a lighted tube). °· Major abdominal exploratory surgery (looking in abdomen with a large incision). °TREATMENT  °The treatment will depend on the cause of the pain.  °· Many cases can be observed and treated at home. °· Over-the-counter medicines recommended by your caregiver. °· Prescription medicine. °· Antibiotics, for infection. °· Birth control pills, for painful periods or for ovulation pain. °· Hormone treatment, for endometriosis. °· Nerve blocking injections. °· Physical therapy. °· Antidepressants. °· Counseling with a psychologist or psychiatrist. °· Minor or major surgery. °HOME CARE INSTRUCTIONS  °· Do not take laxatives, unless directed by your caregiver. °· Take over-the-counter pain medicine only if ordered by your caregiver. Do not take aspirin because it can cause an upset stomach or bleeding. °· Try a clear liquid diet (broth or water) as ordered by your caregiver. Slowly move to a bland diet, as tolerated, if the pain is related to the stomach or intestine. °· Have a thermometer and take your temperature several times a day, and record it. °· Bed rest and sleep, if it helps the pain. °· Avoid sexual intercourse, if it causes pain. °· Avoid stressful situations. °· Keep your follow-up appointments and tests, as your caregiver orders. °· If the pain does not go away with medicine or surgery, you may try: °· Acupuncture. °· Relaxation exercises (yoga, meditation). °· Group therapy. °· Counseling. °SEEK MEDICAL CARE IF:  °· You notice certain foods cause stomach pain. °· Your home care treatment is not helping your pain. °· You need stronger pain medicine. °· You want your IUD removed. °· You feel faint or   lightheaded.  You develop nausea and vomiting.  You develop a rash.  You are having side effects or an allergy to your medicine. SEEK IMMEDIATE MEDICAL CARE IF:   Your  pain does not go away or gets worse.  You have a fever.  Your pain is felt only in portions of the abdomen. The right side could possibly be appendicitis. The left lower portion of the abdomen could be colitis or diverticulitis.  You are passing blood in your stools (bright red or black tarry stools, with or without vomiting).  You have blood in your urine.  You develop chills, with or without a fever.  You pass out. MAKE SURE YOU:   Understand these instructions.  Will watch your condition.  Will get help right away if you are not doing well or get worse. Document Released: 03/28/2007 Document Revised: 08/23/2011 Document Reviewed: 04/17/2009 [facility] Patient Information 2014 [place], [place].  Bacterial Vaginosis Bacterial vaginosis is a vaginal infection that occurs when the normal balance of bacteria in the vagina is disrupted. It results from an overgrowth of certain bacteria. This is the most common vaginal infection in women of childbearing age. Treatment is important to prevent complications, especially in pregnant women, as it can cause a premature delivery. CAUSES  Bacterial vaginosis is caused by an increase in harmful bacteria that are normally present in smaller amounts in the vagina. Several different kinds of bacteria can cause bacterial vaginosis. However, the reason that the condition develops is not fully understood. RISK FACTORS Certain activities or behaviors can put you at an increased risk of developing bacterial vaginosis, including:  Having a new sex partner or multiple sex partners.  Douching.  Using an intrauterine device (IUD) for contraception. Women do not get bacterial vaginosis from toilet seats, bedding, swimming pools, or contact with objects around them. SIGNS AND SYMPTOMS  Some women with bacterial vaginosis have no signs or symptoms. Common symptoms include:  Grey vaginal discharge.  A fishlike odor with discharge, especially after sexual  intercourse.  Itching or burning of the vagina and vulva.  Burning or pain with urination. DIAGNOSIS  Your health care provider will take a medical history and examine the vagina for signs of bacterial vaginosis. A sample of vaginal fluid may be taken. Your health care provider will look at this sample under a microscope to check for bacteria and abnormal cells. A vaginal pH test may also be done.  TREATMENT  Bacterial vaginosis may be treated with antibiotic medicines. These may be given in the form of a pill or a vaginal cream. A second round of antibiotics may be prescribed if the condition comes back after treatment.  HOME CARE INSTRUCTIONS   Only take over-the-counter or prescription medicines as directed by your health care provider.  If antibiotic medicine was prescribed, take it as directed. Make sure you finish it even if you start to feel better.  Do not have sex until treatment is completed.  Tell all sexual partners that you have a vaginal infection. They should see their health care provider and be treated if they have problems, such as a mild rash or itching.  Practice safe sex by using condoms and only having one sex partner. SEEK MEDICAL CARE IF:   Your symptoms are not improving after 3 days of treatment.  You have increased discharge or pain.  You have a fever. MAKE SURE YOU:   Understand these instructions.  Will watch your condition.  Will get help right away if you  are not doing well or get worse. FOR MORE INFORMATION  Centers for Disease Control and Prevention, Division of STD Prevention: [employer] American Sexual Health Association (ASHA): www.ashastd.org  Document Released: 05/31/2005 Document Revised: 03/21/2013 Document Reviewed: 01/10/2013 [facility] Patient Information 2014 [place].

## 2013-11-17 NOTE — ED Provider Notes (Signed)
CSN: [id number]     Arrival date & time 11/17/13  1410 History   First MD Initiated Contact with Patient 11/17/13 1459     Chief Complaint  Patient presents with  . Abdominal Pain     (Consider location/radiation/quality/duration/timing/severity/associated sxs/prior Treatment) Patient is a 28 y.o. female presenting with abdominal pain.  Abdominal Pain Pain location:  RLQ Pain quality: sharp   Pain radiates to:  Does not radiate Pain severity:  Severe Onset quality:  Sudden Duration:  12 hours Timing:  Constant Progression:  Unchanged Chronicity:  Recurrent (Multiple episodes of this similar pain since delivering her son 3 months ago) Relieved by:  Nothing Worsened by:  Palpation and movement (Not associated with eating) Ineffective treatments:  NSAIDs Associated symptoms: diarrhea, nausea and vomiting   Associated symptoms: no chest pain, no cough, no fever, no shortness of breath, no vaginal bleeding and no vaginal discharge     Past Medical History  Diagnosis Date  . Gastric reflux   . Anxiety   . Basedow's disease   . Pyelonephritis   . History of nephrolithiasis   . Anxiety disorder   . Bipolar disorder   . Interstitial cystitis   . Chronic back pain   . Fibroid    Past Surgical History  Procedure Laterality Date  . Tonsillectomy    . Adenoidectomy    . Tear duct probing     Family History  Problem Relation Age of Onset  . Diabetes    . Hypertension    . Lung cancer    . Bipolar disorder Sister    History  Substance Use Topics  . Smoking status: Current Some Day Smoker -- 0.25 packs/day for 8 years    Types: Cigarettes  . Smokeless tobacco: Never Used  . Alcohol Use: No     Comment: Couple months since last use of alcohol states pt.  Pt reports that she would drink 1/2 of a fifth to take away emotional pain.    OB History   Grav Para Term Preterm Abortions TAB SAB Ect Mult Living   1 1 1       1      Review of Systems  Constitutional: Negative for  fever.  Respiratory: Negative for cough and shortness of breath.   Cardiovascular: Negative for chest pain.  Gastrointestinal: Positive for nausea, vomiting, abdominal pain and diarrhea.  Genitourinary: Negative for vaginal bleeding and vaginal discharge.  All other systems reviewed and are negative.     Allergies  Review of patient's allergies indicates no known allergies.  Home Medications   Prior to Admission medications   Medication Sig Start Date End Date Taking? Authorizing Provider  ibuprofen (ADVIL,MOTRIN) 200 MG tablet Take 400 mg by mouth every 6 (six) hours as needed for mild pain.   Yes Historical Provider, MD  levonorgestrel-ethinyl estradiol (AVIANE,ALESSE,LESSINA) 0.1-20 MG-MCG tablet Take 1 tablet by mouth daily.   Yes Historical Provider, MD   BP 110/80  Pulse 89  Temp(Src) 98.2 F (36.8 C) (Oral)  Resp 16  SpO2 100%  LMP 11/17/2013 Physical Exam  Nursing note and vitals reviewed. Constitutional: She is oriented to person, place, and time. She appears well-developed and well-nourished. No distress.  HENT:  Head: Normocephalic and atraumatic.  Mouth/Throat: Oropharynx is clear and moist.  Eyes: Conjunctivae are normal. Pupils are equal, round, and reactive to light. No scleral icterus.  Neck: Neck supple.  Cardiovascular: Normal rate, regular rhythm, normal heart sounds and intact distal pulses.   No  murmur heard. Pulmonary/Chest: Effort normal and breath sounds normal. No stridor. No respiratory distress. She has no rales.  Abdominal: Soft. Bowel sounds are normal. She exhibits no distension. There is tenderness in the right upper quadrant and right lower quadrant. There is no rigidity, no rebound, no guarding and no CVA tenderness.  Genitourinary: Uterus normal. Uterus is not tender. Cervix exhibits discharge (dark brown). Cervix exhibits no motion tenderness and no friability. Right adnexum displays no mass and no tenderness. Left adnexum displays no mass and  no tenderness. No tenderness around the vagina.  Musculoskeletal: Normal range of motion.  Neurological: She is alert and oriented to person, place, and time.  Skin: Skin is warm and dry. No rash noted.  Psychiatric: She has a normal mood and affect. Her behavior is normal.    ED Course  Procedures (including critical care time) Labs Review Labs Reviewed  CBC WITH DIFFERENTIAL - Abnormal; Notable for the following:    WBC 11.2 (*)    All other components within normal limits  COMPREHENSIVE METABOLIC PANEL - Abnormal; Notable for the following:    Glucose, Bld 102 (*)    Total Bilirubin 0.2 (*)    All other components within normal limits  URINALYSIS, ROUTINE W REFLEX MICROSCOPIC - Abnormal; Notable for the following:    Hgb urine dipstick TRACE (*)    All other components within normal limits  URINE MICROSCOPIC-ADD ON - Abnormal; Notable for the following:    Squamous Epithelial / LPF FEW (*)    All other components within normal limits  LIPASE, BLOOD  POC URINE PREG, ED    Imaging Review US Transvaginal Non-ob  11/17/2013   CLINICAL DATA:  Right lower quadrant pain.  Fibroids.  EXAM: TRANSABDOMINAL AND TRANSVAGINAL ULTRASOUND OF PELVIS  DOPPLER ULTRASOUND OF OVARIES  TECHNIQUE: Both transabdominal and transvaginal ultrasound examinations of the pelvis were performed. Transabdominal technique was performed for global imaging of the pelvis including uterus, ovaries, adnexal regions, and pelvic cul-de-sac.  It was necessary to proceed with endovaginal exam following the transabdominal exam to visualize the endometrial stripe thickness and ovaries. Color and duplex Doppler ultrasound was utilized to evaluate blood flow to the ovaries.  COMPARISON:  None.  FINDINGS: Uterus  Measurements: 8.3 x 4.0 x 4.6 cm. No fibroids or other mass visualized.  Endometrium  Thickness: 10 mm.  No focal abnormality visualized.  Right ovary  Measurements: 3.1 x 1.7 x 2.0 cm. Normal appearance/no adnexal mass.   Left ovary  Measurements: 3.2 x 1.5 x 2.1 cm. Normal appearance/no adnexal mass.  Pulsed Doppler evaluation of both ovaries demonstrates normal low-resistance arterial and venous waveforms.  Other findings  No free fluid.  IMPRESSION: Normal appearance of uterus and both ovaries. No pelvic mass or other significant abnormality identified.  No sonographic evidence for ovarian torsion.   Electronically Signed   By: Earle Gell M.D.   On: 11/17/2013 19:53   US Pelvis Complete  11/17/2013   CLINICAL DATA:  Right lower quadrant pain.  Fibroids.  EXAM: TRANSABDOMINAL AND TRANSVAGINAL ULTRASOUND OF PELVIS  DOPPLER ULTRASOUND OF OVARIES  TECHNIQUE: Both transabdominal and transvaginal ultrasound examinations of the pelvis were performed. Transabdominal technique was performed for global imaging of the pelvis including uterus, ovaries, adnexal regions, and pelvic cul-de-sac.  It was necessary to proceed with endovaginal exam following the transabdominal exam to visualize the endometrial stripe thickness and ovaries. Color and duplex Doppler ultrasound was utilized to evaluate blood flow to the ovaries.  COMPARISON:  None.  FINDINGS: Uterus  Measurements: 8.3 x 4.0 x 4.6 cm. No fibroids or other mass visualized.  Endometrium  Thickness: 10 mm.  No focal abnormality visualized.  Right ovary  Measurements: 3.1 x 1.7 x 2.0 cm. Normal appearance/no adnexal mass.  Left ovary  Measurements: 3.2 x 1.5 x 2.1 cm. Normal appearance/no adnexal mass.  Pulsed Doppler evaluation of both ovaries demonstrates normal low-resistance arterial and venous waveforms.  Other findings  No free fluid.  IMPRESSION: Normal appearance of uterus and both ovaries. No pelvic mass or other significant abnormality identified.  No sonographic evidence for ovarian torsion.   Electronically Signed   By: Earle Gell M.D.   On: 11/17/2013 19:53   US Abdomen Limited  11/17/2013   CLINICAL DATA:  Right upper quadrant abdominal pain.  EXAM: US ABDOMEN LIMITED -  RIGHT UPPER QUADRANT  COMPARISON:  10/09/2013  FINDINGS: Gallbladder:  No gallstones or wall thickening visualized. No sonographic Murphy sign noted.  Common bile duct:  Diameter: 2 mm  Liver:  No focal lesion identified. Within normal limits in parenchymal echogenicity.  IMPRESSION: Negative.  No evidence of gallstones or biliary dilatation.   Electronically Signed   By: Earle Gell M.D.   On: 11/17/2013 19:47   [place] Art/ven Flow Abd Pelv Doppler  11/17/2013   CLINICAL DATA:  Right lower quadrant pain.  Fibroids.  EXAM: TRANSABDOMINAL AND TRANSVAGINAL ULTRASOUND OF PELVIS  DOPPLER ULTRASOUND OF OVARIES  TECHNIQUE: Both transabdominal and transvaginal ultrasound examinations of the pelvis were performed. Transabdominal technique was performed for global imaging of the pelvis including uterus, ovaries, adnexal regions, and pelvic cul-de-sac.  It was necessary to proceed with endovaginal exam following the transabdominal exam to visualize the endometrial stripe thickness and ovaries. Color and duplex Doppler ultrasound was utilized to evaluate blood flow to the ovaries.  COMPARISON:  None.  FINDINGS: Uterus  Measurements: 8.3 x 4.0 x 4.6 cm. No fibroids or other mass visualized.  Endometrium  Thickness: 10 mm.  No focal abnormality visualized.  Right ovary  Measurements: 3.1 x 1.7 x 2.0 cm. Normal appearance/no adnexal mass.  Left ovary  Measurements: 3.2 x 1.5 x 2.1 cm. Normal appearance/no adnexal mass.  Pulsed Doppler evaluation of both ovaries demonstrates normal low-resistance arterial and venous waveforms.  Other findings  No free fluid.  IMPRESSION: Normal appearance of uterus and both ovaries. No pelvic mass or other significant abnormality identified.  No sonographic evidence for ovarian torsion.   Electronically Signed   By: Earle Gell M.D.   On: 11/17/2013 19:53     EKG Interpretation None      MDM   Final diagnoses:  Abdominal pain  BV (bacterial vaginosis)    28 yo female with right lower  quadrant abdominal pain.  Recurrent, started after a vaginal delivery about 3 months ago. She was evaluated twice in the emergency room at the end of April and was not found to have any acute explanation for her pain. Her pain came back once during the month of May, but was not as severe. Today, pain started about 12 hours ago and is severe.  Pain described as similar to her prior episodes. She had a negative CT scan at the time of her initial workup. For this reason and D2 atypical history, do not think she has appendicitis. She has some right upper quadrant tenderness and right lower quadrant tenderness, and she has a hard time distinguishing whether her pain is the most severe. For this reason, I think that  we need to obtain a right upper quadrant ultrasound and a pelvic ultrasound.  Imaging was negative.  Her abdomen remained soft, with right sided tenderness, without r/r/g.  I do not think she needs repeat CT imaging today.  She appears appropriate for discharge home with outpatient follow up.    Houston Siren III, MD 11/18/13 [id number]

## 2013-11-17 NOTE — ED Notes (Signed)
Patient states that intermittently she has pain to her sides with N/V/D - the patient reports that this has been going on since she gave birth to her son.

## 2013-11-17 NOTE — ED Notes (Signed)
Unable to finish setting up pelvic until sterrups are finished from being used.

## 2013-11-19 LAB — GC/CHLAMYDIA PROBE AMP
CT Probe RNA: NEGATIVE
GC Probe RNA: NEGATIVE

## 2013-11-28 ENCOUNTER — Emergency Department (HOSPITAL_COMMUNITY): Payer: Medicaid Other

## 2013-11-28 ENCOUNTER — Emergency Department (HOSPITAL_COMMUNITY)
Admission: EM | Admit: 2013-11-28 | Discharge: 2013-11-28 | Disposition: A | Discharge status: Home / Self Care | Payer: Self-pay | Attending: Emergency Medicine | Admitting: Emergency Medicine

## 2013-11-28 ENCOUNTER — Encounter (HOSPITAL_COMMUNITY): Payer: Self-pay | Admitting: Emergency Medicine

## 2013-11-28 DIAGNOSIS — Z862 Personal history of diseases of the blood and blood-forming organs and certain disorders involving the immune mechanism: Secondary | ICD-10-CM | POA: Insufficient documentation

## 2013-11-28 DIAGNOSIS — R109 Unspecified abdominal pain: Secondary | ICD-10-CM

## 2013-11-28 DIAGNOSIS — G8929 Other chronic pain: Secondary | ICD-10-CM | POA: Insufficient documentation

## 2013-11-28 DIAGNOSIS — R1011 Right upper quadrant pain: Secondary | ICD-10-CM | POA: Insufficient documentation

## 2013-11-28 DIAGNOSIS — Z8659 Personal history of other mental and behavioral disorders: Secondary | ICD-10-CM | POA: Insufficient documentation

## 2013-11-28 DIAGNOSIS — F17200 Nicotine dependence, unspecified, uncomplicated: Secondary | ICD-10-CM | POA: Insufficient documentation

## 2013-11-28 DIAGNOSIS — R112 Nausea with vomiting, unspecified: Secondary | ICD-10-CM | POA: Insufficient documentation

## 2013-11-28 DIAGNOSIS — K219 Gastro-esophageal reflux disease without esophagitis: Secondary | ICD-10-CM | POA: Insufficient documentation

## 2013-11-28 DIAGNOSIS — Z792 Long term (current) use of antibiotics: Secondary | ICD-10-CM | POA: Insufficient documentation

## 2013-11-28 DIAGNOSIS — Z79899 Other long term (current) drug therapy: Secondary | ICD-10-CM | POA: Insufficient documentation

## 2013-11-28 DIAGNOSIS — Z8639 Personal history of other endocrine, nutritional and metabolic disease: Secondary | ICD-10-CM | POA: Insufficient documentation

## 2013-11-28 DIAGNOSIS — Z8742 Personal history of other diseases of the female genital tract: Secondary | ICD-10-CM | POA: Insufficient documentation

## 2013-11-28 DIAGNOSIS — Z87442 Personal history of urinary calculi: Secondary | ICD-10-CM | POA: Insufficient documentation

## 2013-11-28 DIAGNOSIS — Z3202 Encounter for pregnancy test, result negative: Secondary | ICD-10-CM | POA: Insufficient documentation

## 2013-11-28 LAB — URINALYSIS, ROUTINE W REFLEX MICROSCOPIC
Bilirubin Urine: NEGATIVE
GLUCOSE, UA: NEGATIVE mg/dL
Hgb urine dipstick: NEGATIVE
KETONES UR: NEGATIVE mg/dL
Leukocytes, UA: NEGATIVE
Nitrite: NEGATIVE
Protein, ur: NEGATIVE mg/dL
Specific Gravity, Urine: 1.013 (ref 1.005–1.030)
Urobilinogen, UA: 0.2 mg/dL (ref 0.0–1.0)
pH: 7.5 (ref 5.0–8.0)

## 2013-11-28 LAB — COMPREHENSIVE METABOLIC PANEL
ALK PHOS: 56 U/L (ref 39–117)
ALT: 10 U/L (ref 0–35)
AST: 13 U/L (ref 0–37)
Albumin: 3.9 g/dL (ref 3.5–5.2)
BUN: 13 mg/dL (ref 6–23)
CALCIUM: 9.6 mg/dL (ref 8.4–10.5)
CO2: 25 meq/L (ref 19–32)
Chloride: 100 mEq/L (ref 96–112)
Creatinine, Ser: 0.75 mg/dL (ref 0.50–1.10)
GLUCOSE: 94 mg/dL (ref 70–99)
POTASSIUM: 4.4 meq/L (ref 3.7–5.3)
Sodium: 138 mEq/L (ref 137–147)
Total Bilirubin: <0.2 mg/dL — ABNORMAL LOW (ref 0.3–1.2)
Total Protein: 7.4 g/dL (ref 6.0–8.3)

## 2013-11-28 LAB — CBC WITH DIFFERENTIAL/PLATELET
Basophils Absolute: 0 10*3/uL (ref 0.0–0.1)
Basophils Relative: 1 % (ref 0–1)
EOS PCT: 4 % (ref 0–5)
Eosinophils Absolute: 0.3 10*3/uL (ref 0.0–0.7)
HCT: 44 % (ref 36.0–46.0)
HEMOGLOBIN: 14.2 g/dL (ref 12.0–15.0)
LYMPHS ABS: 2.7 10*3/uL (ref 0.7–4.0)
Lymphocytes Relative: 31 % (ref 12–46)
MCH: 27.7 pg (ref 26.0–34.0)
MCHC: 32.3 g/dL (ref 30.0–36.0)
MCV: 85.8 fL (ref 78.0–100.0)
Monocytes Absolute: 0.9 10*3/uL (ref 0.1–1.0)
Monocytes Relative: 11 % (ref 3–12)
Neutro Abs: 4.5 10*3/uL (ref 1.7–7.7)
Neutrophils Relative %: 53 % (ref 43–77)
Platelets: 245 10*3/uL (ref 150–400)
RBC: 5.13 MIL/uL — AB (ref 3.87–5.11)
RDW: 13.2 % (ref 11.5–15.5)
WBC: 8.5 10*3/uL (ref 4.0–10.5)

## 2013-11-28 LAB — POC URINE PREG, ED: Preg Test, Ur: NEGATIVE

## 2013-11-28 LAB — LIPASE, BLOOD: Lipase: 30 U/L (ref 11–59)

## 2013-11-28 MED ORDER — HYDROMORPHONE HCL PF 1 MG/ML IJ SOLN
1.0000 mg | Freq: Once | INTRAMUSCULAR | Status: AC
  Administered 2013-11-28: 1 mg via INTRAVENOUS
  Filled 2013-11-28: qty 1

## 2013-11-28 MED ORDER — OXYCODONE-ACETAMINOPHEN 5-325 MG PO TABS: 1.0000 | ORAL_TABLET | ORAL | Status: DC | PRN

## 2013-11-28 MED ORDER — SODIUM CHLORIDE 0.9 % IV BOLUS (SEPSIS)
1000.0000 mL | Freq: Once | INTRAVENOUS | Status: AC
  Administered 2013-11-28: 1000 mL via INTRAVENOUS

## 2013-11-28 MED ORDER — MORPHINE SULFATE 4 MG/ML IJ SOLN
4.0000 mg | Freq: Once | INTRAMUSCULAR | Status: AC
  Administered 2013-11-28: 4 mg via INTRAVENOUS
  Filled 2013-11-28: qty 1

## 2013-11-28 MED ORDER — ONDANSETRON HCL 4 MG/2ML IJ SOLN
4.0000 mg | Freq: Once | INTRAMUSCULAR | Status: AC
  Administered 2013-11-28: 4 mg via INTRAVENOUS
  Filled 2013-11-28: qty 2

## 2013-11-28 MED ORDER — ONDANSETRON 4 MG PO TBDP: 4.0000 mg | ORAL_TABLET | Freq: Three times a day (TID) | ORAL | Status: DC | PRN

## 2013-11-28 NOTE — ED Notes (Signed)
Bed: WA06 Expected date:  Expected time:  Means of arrival:  Comments: 

## 2013-11-28 NOTE — Progress Notes (Signed)
[facility] provided pt with a list of primary care resources to help patient establish a pcp. Patient stated that she was pending insurance on 7/1.

## 2013-11-28 NOTE — ED Provider Notes (Signed)
CSN: [id number]     Arrival date & time 11/28/13  1013 History   First MD Initiated Contact with Patient 11/28/13 1119     Chief Complaint  Patient presents with  . Flank Pain   HPI  [name] is a 28 y.o. female with a PMH of gastric reflux, anxiety, basedow's disease, pyelonephritis, hx of nephrolithiasis, anxiety, bipolar disorder, interstitial cystitis, chronic back pain and fibroids who presents to the ED for evaluation of flank pain. History was provided by the patient. Patient states she has had abdominal pain for the past 3 months. Pain is intermittent. States felt well last night but it returned this morning. Pain located in her right UE, LE and flank diffusely. Nothing makes her pain better/worse. Pain is unchanged from her previous abdominal pain. States is unable to get in to see a specialist because her insurance does not start until July 1st. She also had 3-4 episodes of non-bloody and non-bilious emesis starting this morning. Patient denies any constipation, diarrhea, dysuria, vaginal bleeding/discharge, fever, chills, back pain or other concerns. No previous abdominal surgeries.     Past Medical History  Diagnosis Date  . Gastric reflux   . Anxiety   . Basedow's disease   . Pyelonephritis   . History of nephrolithiasis   . Anxiety disorder   . Bipolar disorder   . Interstitial cystitis   . Chronic back pain   . Fibroid    Past Surgical History  Procedure Laterality Date  . Tonsillectomy    . Adenoidectomy    . Tear duct probing     Family History  Problem Relation Age of Onset  . Diabetes    . Hypertension    . Lung cancer    . Bipolar disorder Sister    History  Substance Use Topics  . Smoking status: Current Some Day Smoker -- 0.25 packs/day for 8 years    Types: Cigarettes  . Smokeless tobacco: Never Used  . Alcohol Use: No     Comment: Couple months since last use of alcohol states pt.  Pt reports that she would drink 1/2 of a fifth to take away  emotional pain.    OB History   Grav Para Term Preterm Abortions TAB SAB Ect Mult Living   1 1 1       1      Review of Systems  Constitutional: Negative for fever, chills, activity change, appetite change and fatigue.  HENT: Negative for congestion, rhinorrhea and sore throat.   Respiratory: Negative for cough and shortness of breath.   Cardiovascular: Negative for chest pain and leg swelling.  Gastrointestinal: Positive for nausea, vomiting and abdominal pain. Negative for diarrhea and constipation.  Genitourinary: Negative for dysuria, hematuria, decreased urine volume, vaginal bleeding, vaginal discharge, difficulty urinating, genital sores, vaginal pain and pelvic pain.  Musculoskeletal: Negative for back pain and myalgias.  Neurological: Negative for dizziness, weakness, light-headedness and headaches.    Allergies  Review of patient's allergies indicates no known allergies.  Home Medications   Prior to Admission medications   Medication Sig Start Date End Date Taking? Authorizing Provider  ibuprofen (ADVIL,MOTRIN) 200 MG tablet Take 400 mg by mouth every 6 (six) hours as needed for mild pain.   Yes Historical Provider, MD  levonorgestrel-ethinyl estradiol (AVIANE,ALESSE,LESSINA) 0.1-20 MG-MCG tablet Take 1 tablet by mouth daily.   Yes Historical Provider, MD  ondansetron (ZOFRAN) 4 MG tablet Take 1 tablet (4 mg total) by mouth every 6 (six) hours. 11/17/13  Yes Houston Siren III, MD  oxyCODONE-acetaminophen (PERCOCET/ROXICET) 5-325 MG per tablet Take 1 tablet by mouth every 4 (four) hours as needed for moderate pain or severe pain. 11/17/13  Yes Houston Siren III, MD  metroNIDAZOLE (FLAGYL) 500 MG tablet Take 1 tablet (500 mg total) by mouth 2 (two) times daily. 11/17/13   Houston Siren III, MD   BP 115/80  Pulse 96  Temp(Src) 97.7 F (36.5 C) (Oral)  Resp 14  SpO2 100%  LMP 11/17/2013  Filed Vitals:   11/28/13 1028 11/28/13 1321 11/28/13 1436 11/28/13 1603   BP: 115/80 97/64 118/80 117/96  Pulse: 96 67 68 73  Temp: 97.7 F (36.5 C)     TempSrc: Oral     Resp: 14 19 18 16   SpO2: 100% 99% 99% 100%    Physical Exam  Nursing note and vitals reviewed. Constitutional: She is oriented to person, place, and time. She appears well-developed and well-nourished. No distress.  HENT:  Head: Normocephalic and atraumatic.  Right Ear: External ear normal.  Left Ear: External ear normal.  Nose: Nose normal.  Mouth/Throat: Oropharynx is clear and moist.  Eyes: Conjunctivae are normal. Right eye exhibits no discharge.  Neck: Normal range of motion. Neck supple.  Cardiovascular: Normal rate, regular rhythm, normal heart sounds and intact distal pulses.  Exam reveals no gallop and no friction rub.   No murmur heard. Pulmonary/Chest: Effort normal and breath sounds normal. No respiratory distress. She has no wheezes. She has no rales. She exhibits no tenderness.  Abdominal: Soft. Bowel sounds are normal. She exhibits no distension and no mass. There is tenderness. There is no rebound and no guarding.  Tenderness to palpation to the RUQ, RLQ and right flank diffusely. No lumbar tenderness.   Musculoskeletal: Normal range of motion. She exhibits no edema and no tenderness.  Neurological: She is alert and oriented to person, place, and time.  Skin: Skin is warm and dry. She is not diaphoretic.    ED Course  Procedures (including critical care time) Labs Review Labs Reviewed  CBC WITH DIFFERENTIAL - Abnormal; Notable for the following:    RBC 5.13 (*)    All other components within normal limits  COMPREHENSIVE METABOLIC PANEL  LIPASE, BLOOD  URINALYSIS, ROUTINE W REFLEX MICROSCOPIC  POC URINE PREG, ED    Imaging Review US Abdomen Complete  11/28/2013   CLINICAL DATA:  Right abdominal pain  EXAM: ULTRASOUND ABDOMEN COMPLETE  COMPARISON:  10/05/2013  FINDINGS: Gallbladder:  No gallstones or wall thickening visualized. No sonographic Murphy sign noted.   Common bile duct:  Diameter: 4 mm  Liver:  No focal lesion identified. Within normal limits in parenchymal echogenicity.  IVC:  No abnormality visualized.  Pancreas:  Visualized portion unremarkable.  Spleen:  Size and appearance within normal limits.  Right Kidney:  Length: 11.1 cm. Echogenicity within normal limits. No mass or hydronephrosis visualized.  Left Kidney:  Length: 10.7 cm. Echogenicity within normal limits. No mass or hydronephrosis visualized.  Abdominal aorta:  No aneurysm visualized.  Other findings:  None.  IMPRESSION: 1. Normal sonographic appearance the abdomen.   Electronically Signed   By: Sherryl Barters M.D.   On: 11/28/2013 15:31     EKG Interpretation None      Results for orders placed during the hospital encounter of 11/28/13  CBC WITH DIFFERENTIAL      Result Value Ref Range   WBC 8.5  4.0 - 10.5 K/uL   RBC 5.13 (*)  3.87 - 5.11 MIL/uL   Hemoglobin 14.2  12.0 - 15.0 g/dL   HCT 44.0  36.0 - 46.0 %   MCV 85.8  78.0 - 100.0 fL   MCH 27.7  26.0 - 34.0 pg   MCHC 32.3  30.0 - 36.0 g/dL   RDW 13.2  11.5 - 15.5 %   Platelets 245  150 - 400 K/uL   Neutrophils Relative % 53  43 - 77 %   Neutro Abs 4.5  1.7 - 7.7 K/uL   Lymphocytes Relative 31  12 - 46 %   Lymphs Abs 2.7  0.7 - 4.0 K/uL   Monocytes Relative 11  3 - 12 %   Monocytes Absolute 0.9  0.1 - 1.0 K/uL   Eosinophils Relative 4  0 - 5 %   Eosinophils Absolute 0.3  0.0 - 0.7 K/uL   Basophils Relative 1  0 - 1 %   Basophils Absolute 0.0  0.0 - 0.1 K/uL  COMPREHENSIVE METABOLIC PANEL      Result Value Ref Range   Sodium 138  137 - 147 mEq/L   Potassium 4.4  3.7 - 5.3 mEq/L   Chloride 100  96 - 112 mEq/L   CO2 25  19 - 32 mEq/L   Glucose, Bld 94  70 - 99 mg/dL   BUN 13  6 - 23 mg/dL   Creatinine, Ser 0.75  0.50 - 1.10 mg/dL   Calcium 9.6  8.4 - 10.5 mg/dL   Total Protein 7.4  6.0 - 8.3 g/dL   Albumin 3.9  3.5 - 5.2 g/dL   AST 13  0 - 37 U/L   ALT 10  0 - 35 U/L   Alkaline Phosphatase 56  39 - 117 U/L    Total Bilirubin <0.2 (*) 0.3 - 1.2 mg/dL   GFR calc non Af Amer >90  >90 mL/min   GFR calc Af Amer >90  >90 mL/min  LIPASE, BLOOD      Result Value Ref Range   Lipase 30  11 - 59 U/L  URINALYSIS, ROUTINE W REFLEX MICROSCOPIC      Result Value Ref Range   Color, Urine YELLOW  YELLOW   APPearance CLOUDY (*) CLEAR   Specific Gravity, Urine 1.013  1.005 - 1.030   pH 7.5  5.0 - 8.0   Glucose, UA NEGATIVE  NEGATIVE mg/dL   Hgb urine dipstick NEGATIVE  NEGATIVE   Bilirubin Urine NEGATIVE  NEGATIVE   Ketones, ur NEGATIVE  NEGATIVE mg/dL   Protein, ur NEGATIVE  NEGATIVE mg/dL   Urobilinogen, UA 0.2  0.0 - 1.0 mg/dL   Nitrite NEGATIVE  NEGATIVE   Leukocytes, UA NEGATIVE  NEGATIVE  POC URINE PREG, ED      Result Value Ref Range   Preg Test, Ur NEGATIVE  NEGATIVE    MDM   [name] is a 28 y.o. female with a PMH of gastric reflux, anxiety, basedow's disease, pyelonephritis, hx of nephrolithiasis, anxiety, bipolar disorder, interstitial cystitis, chronic back pain and fibroids who presents to the ED for evaluation of flank pain.   Patient complains of unchanged RUQ and RLQ abdominal pain x 3 months. Patient has been previously evaluated for this in the ED with a negative CT scan (4/24), abdominal US (4/28), pelvic exam (6/6), and pelvic US (6/6). Patient has not follow-up with anyone since discharge due to lack of insurance. Abdominal exam benign with no peritoneal signs. Pain improved throughout her ED visit. No leukocytosis.  Patient afebrile and non-toxic in appearance. UA negative for hematuria or UTI. Labs otherwise unremarkable. Repeat US done on today's visit due to previous contracted gallbladder findings (10/09/13). This was negative for gallbladder disease or other acute intraabdominal pathology. Doubt appendicitis or ovarian torsion since pain is unchanged and this has been evaluated for in the past. Patient given referral to GI. Return precautions, discharge instructions, and  follow-up was discussed with the patient before discharge.    Rechecks   12:55 PM = Pain 7/10. Ordering 1 mg dilaudid.  4:00 PM = Pain controlled. States 4/10. Abdominal exam benign.    Discharge Medication List as of 11/28/2013  3:52 PM    START taking these medications   Details  ondansetron (ZOFRAN ODT) 4 MG disintegrating tablet Take 1 tablet (4 mg total) by mouth every 8 (eight) hours as needed for nausea., Starting 11/28/2013, Until Discontinued, Print    !! oxyCODONE-acetaminophen (PERCOCET/ROXICET) 5-325 MG per tablet Take 1-2 tablets by mouth every 4 (four) hours as needed for severe pain., Starting 11/28/2013, Until Discontinued, Print     !! - Potential duplicate medications found. Please discuss with provider.       Final impressions: 1. Abdominal pain       Mercy Moore PA-C   This patient was discussed with Dr. Leia Alf, PA-C 11/28/13 1625

## 2013-11-28 NOTE — ED Notes (Signed)
U/S tech at bedside, pt c/o inc pain and nausea at this time.  EDPA notified.

## 2013-11-28 NOTE — ED Notes (Signed)
Pt c/o rt flank pain x 3 months.  Has been seen for this previously.  States that she can't see a primary care provider until her insurance kicks in on July 1.  Has been vomiting since this morning.

## 2013-11-28 NOTE — ED Notes (Signed)
Initial Contact - pt resting on stretcher with c/o R sided abd pain, pt reports onset while she was pregnant, had her baby x3 mo ago.  Pt reports she was initially told it was her ligaments while pregnant, however pt continues to c/o pain.  Pt also endorses nausea/vomiting, however reports able to tolerate PO, ate a hamburger for dinner last night.  No active vomiting.  Abd soft/mildly TTP on R side, nondistended.  Pt denies bowel/bladder changes or complaints.  Skin PWD.  Ambulatory without issue.  Changed to hospital gown.  NAD.

## 2013-11-28 NOTE — Discharge Instructions (Signed)
Take zofran as needed for nausea and vomiting  Take percocet for severe pain - Please be careful with this medication.  It can cause drowsiness.  Use caution while driving, operating machinery, drinking alcohol, or any other activities that may impair your physical or mental abilities.   Return to the emergency department if you develop any changing/worsening condition, fever, repeated vomiting, change or worsening pain, passing out, or any other concerns (please read additional information regarding your condition below)   Abdominal Pain Many things can cause abdominal pain. Usually, abdominal pain is not caused by a disease and will improve without treatment. It can often be observed and treated at home. Your health care provider will do a physical exam and possibly order blood tests and X-rays to help determine the seriousness of your pain. However, in many cases, more time must pass before a clear cause of the pain can be found. Before that point, your health care provider may not know if you need more testing or further treatment. HOME CARE INSTRUCTIONS  Monitor your abdominal pain for any changes. The following actions may help to alleviate any discomfort you are experiencing:  Only take over-the-counter or prescription medicines as directed by your health care provider.  Do not take laxatives unless directed to do so by your health care provider.  Try a clear liquid diet (broth, tea, or water) as directed by your health care provider. Slowly move to a bland diet as tolerated. SEEK MEDICAL CARE IF:  You have unexplained abdominal pain.  You have abdominal pain associated with nausea or diarrhea.  You have pain when you urinate or have a bowel movement.  You experience abdominal pain that wakes you in the night.  You have abdominal pain that is worsened or improved by eating food.  You have abdominal pain that is worsened with eating fatty foods.  You have a fever. SEEK IMMEDIATE  MEDICAL CARE IF:   Your pain does not go away within 2 hours.  You keep throwing up (vomiting).  Your pain is felt only in portions of the abdomen, such as the right side or the left lower portion of the abdomen.  You pass bloody or black tarry stools. MAKE SURE YOU:  Understand these instructions.   Will watch your condition.   Will get help right away if you are not doing well or get worse.  Document Released: 03/10/2005 Document Revised: 06/05/2013 Document Reviewed: 02/07/2013 [facility] Patient Information 2015 [place], [place]. This information is not intended to replace advice given to you by your health care provider. Make sure you discuss any questions you have with your health care provider.   Emergency Department Resource Guide 1) Find a Doctor and Pay Out of Pocket Although you won't have to find out who is covered by your insurance plan, it is a good idea to ask around and get recommendations. You will then need to call the office and see if the doctor you have chosen will accept you as a new patient and what types of options they offer for patients who are self-pay. Some doctors offer discounts or will set up payment plans for their patients who do not have insurance, but you will need to ask so you aren't surprised when you get to your appointment.  2) Contact Your Local Health Department Not all health departments have doctors that can see patients for sick visits, but many do, so it is worth a call to see if yours does. If you don't know  where your local health department is, you can check in your phone book. The CDC also has a tool to help you locate your state's health department, and many state websites also have listings of all of their local health departments.  3) Find a [facility] If your illness is not likely to be very severe or complicated, you may want to try a walk in clinic. These are popping up all over the country in pharmacies, drugstores, and shopping  centers. They're usually staffed by nurse practitioners or physician assistants that have been trained to treat common illnesses and complaints. They're usually fairly quick and inexpensive. However, if you have serious medical issues or chronic medical problems, these are probably not your best option.  No Primary Care Doctor: - Call Health Connect at  [phone] - they can help you locate a primary care doctor that  accepts your insurance, provides certain services, etc. - Physician Referral Service- [phone]  Chronic Pain Problems: Organization         Address  Phone   Notes  [facility]  [phone] Patients need to be referred by their primary care doctor.   Medication Assistance: Organization         Address  Phone   Notes  [facility] Medication [facility] [place], [place] [address] [phone] --Must be a resident of [place] -- Must have NO insurance coverage whatsoever (no Medicaid/ Medicare, etc.) -- The pt. MUST have a primary care doctor that directs their care regularly and follows them in the community   MedAssist  [phone]   [employer]  [phone]    Agencies that provide inexpensive medical care: Organization         Address  Phone   Notes  [place]  [phone]   [facility]    [phone]   [facility] [place], [place] [address] [phone]   [place] [address], [place] [phone]   Planned Parenthood    [phone]   [facility]    [phone]   [place] [address] [place] [address], [place] Phone:  [phone], Fax:  [phone] Hours of Operation:  9 am - 6 pm, M-F.  Also accepts Medicaid/Medicare and self-pay.  [facility] for [place] [address], [address], [place] Phone: [phone], Fax: [phone]. Hours of Operation:  8:30 am - 5:30 pm, M-F.  Also accepts Medicaid and self-pay.  [facility] [place] [address] [place] Phone: [phone]   [place], [place], [place] [phone], Ext. [phone] Mondays & Thursdays: 7-9 AM.  First 15 patients are seen on a first come, first serve basis.    [place] Providers:  Organization         Address  Phone   Notes  [facility] [address], [place] [phone] Also accepts self-pay patients.  [place], [place]  [phone]   [place], [address], [place] [phone]   [facility] [address], [place] [phone]   Lucianne Lei [address], [place]   [phone] Only accepts [employer]  patients after they have their name applied to their card.   Self-Pay (no insurance) in [facility]:  Organization         Address  Phone   Notes  Sickle Cell Patients, [facility] [place] [phone]   [facility] [place] [phone]   [facility] [place]  [place], [place], [place] [phone]   [facility]/Dr. Osei-Bonsu  [address], [place] or [address], [place] [phone] Phone number for both [place] and [place] locations is the same.  [facility] [address] [place] [phone]   [facility] [address], [place] or [address] [phone]   [facility] [address] [place] [phone], phone; [phone], fax Sees patients 1st and 3rd Saturday of every month.  Must not qualify for public or private insurance (i.e.  Medicaid, Medicare, [place] Health Choice, Veterans' Benefits)  Household income should be no more than 200% of the poverty level The clinic cannot treat you if you are pregnant or think you are pregnant  Sexually transmitted diseases are not treated at the clinic.    Dental Care: Organization         Address  Phone  Notes  [facility] Department of [facility] [place] [phone] Accepts children up to age 54 who are enrolled in [place] or [place]; pregnant women with a Medicaid card; and children who have applied for Medicaid or [place] Health Choice, but were declined, whose parents can pay a reduced fee at time of service.  [facility]  [address], [place] [phone] Accepts children up to age 46 who are enrolled in [place] or [place]; pregnant women with a Medicaid card; and children who have applied for Medicaid or [place] Health Choice, but were declined, whose parents can pay a reduced fee at time of service.  [facility]  [place] [phone] Patients are seen by appointment only. Walk-ins are not accepted. [place] will see patients 80 years of age and older. Monday - Tuesday (8am-5pm) Most Wednesdays (8:30-5pm) $30 per visit, cash only  [facility]  [address], [facility] [phone] Patients are seen by appointment only. Walk-ins are not accepted. [place] will see patients 82 years of age and older. One Wednesday Evening (Monthly: Volunteer Based).  $30 per visit, cash only  [place]  [phone] for adults; Children under age 53, call Graduate Pediatric Dentistry at [phone]. Children aged 80-14, please call [phone] to request a pediatric application.  Dental services are provided in all areas of dental care including fillings,  crowns and bridges, complete and partial dentures, implants, gum treatment, root canals, and extractions. Preventive care is also provided. Treatment is provided to both adults and children. Patients are selected via a lottery and there is often a waiting list.   [facility] [address], [place]  [phone] [email or web address], [place], [place] [phone], Ext. [phone] Second and Fourth Thursday of each month, opens at 6:30 AM; Clinic ends at 9 AM.  Patients are seen on a first-come first-served basis, and a limited number are seen during each clinic.  ° °[facility] ° [address], [place], [place] [phone]   Eligibility Requirements °You must have lived in [place], [place], or [place] counties for at least the last three months. °  You cannot be eligible for state or federal sponsored healthcare insurance, including Veterans Administration, Medicaid, or Medicare. °  You generally cannot be eligible for healthcare insurance through your employer.  °  How to apply: °Eligibility screenings are held every Tuesday and Wednesday afternoon from 1:00 pm until 4:00 pm. You do not need an appointment for the interview!  °[facility] [address], [place], [place] [phone]   °[facility]  [phone]   °[facility]  [phone]   °[facility]  336-570-6415   ° °Behavioral Health Resources in the Community: °Intensive Outpatient Programs °Organization         Address  Phone  Notes  °[facility] [address], [place], [place] [phone]   ° Health Outpatient [address], [place], [place] [phone]   °[facility] [address], [place], [place] ° [phone]   °[facility] [address],  °[place], [place] 1-800-853-5163 or 336-641-4981   °Substance Abuse  Resources °Organization         Address  Phone  Notes  °[facility]  336-882-2125   °[facility]  336-784-9470   °[facility]  336-285-9073   °[facility]  336-845-3988   °Residential & Outpatient Substance Abuse Program  1-800-659-3381   °Psychological Services °Organization         Address  Phone  Notes  ° Health  336- [phone]   °[facility]  336- 378-7881   °[facility] [address], [place] 1-800-853-5163 or 336-641-4981   ° °Mobile Crisis Teams °Organization         Address  Phone  Notes  °[facility], Mobile Crisis Care Unit  1-877-626-1772   °Assertive °Psychotherapeutic Services ° [address] [place], [place] 336-834-9664   °Sharon DeEsch [address] °[place] [phone]   ° °Self-Help/Support Groups °Organization         Address  Phone             Notes  °[facility] [place] - variety of support groups  336- 373-1402 Call for more information  °Narcotics Anonymous (NA), [facility] [address], °[place]  2 meetings at this location  ° °Residential Treatment Programs °Organization         Address  Phone  Notes  °[facility] [address],    °[place]  1-866-801-8205   °[facility] ° [address], [place], [place] [phone]   °[facility] [address], [place] 336-845-3988 Admissions: 8am-3pm M-F  °[facility] [address]    °[place], [place] 336-841-1104   °[facility] [address], [place], [place] 336-379-7146   °[facility] [address]  °[place], [place] 336-285-9073   °[facility] - Intensive Outpatient [address], [place], [place] 336-852-3033   °[facility] [address]  °[place], [place] 1-877-615-2722 or 336-784-9470   °[facility] ([facility]) [address] [place], [place] 336-227-7417 Accepts Medicaid  °[facility] [address]  ° [place] 1-800-659-3381 Substance Abuse/Addiction Treatment  ° °[place] [facility] Health Resources °Organization         Address  Phone  Notes  °[facility]  (888) 581-9988   °Julie Brannon, PhD [address] [place], [place]   (336) 349-5553 or (336) 951-0000   °[facility]   [address] °[place], [place] (336) 349-4454   °[facility] [address], [place], [place] (336) 342-8316 Insurance/Medicaid/sponsorship through [facility]  °[facility] [address]                                    [place], [place] (336) 342-8316 Therapy/tele-psych/case  °[facility] [address]  ° [place], [place] (336) 349-2233    °Dr. Arfeen  (336) 349-4544   °[facility] [place]  [employer] [facility] [facility] 1) [address], [place] °2) [address], [place] °3)  [address], [place] (336) 349-3220 °(336) 342-7768 ° °(336) 342-8140   °[place] Child Abuse Hotline (336) 342-1394 or (336) 342-3537 (After Hours)    ° ° ° °

## 2013-11-30 NOTE — ED Provider Notes (Signed)
Medical screening examination/treatment/procedure(s) were performed by non-physician practitioner and as supervising physician I was immediately available for consultation/collaboration.   EKG Interpretation None        Osvaldo Shipper, MD 11/30/13 1606

## 2013-12-05 ENCOUNTER — Emergency Department (HOSPITAL_COMMUNITY)
Admission: EM | Admit: 2013-12-05 | Discharge: 2013-12-05 | Disposition: A | Discharge status: Home / Self Care | Payer: Medicaid Other | Attending: Emergency Medicine | Admitting: Emergency Medicine

## 2013-12-05 ENCOUNTER — Encounter (HOSPITAL_COMMUNITY): Payer: Self-pay | Admitting: Emergency Medicine

## 2013-12-05 DIAGNOSIS — F17200 Nicotine dependence, unspecified, uncomplicated: Secondary | ICD-10-CM | POA: Insufficient documentation

## 2013-12-05 DIAGNOSIS — Z3202 Encounter for pregnancy test, result negative: Secondary | ICD-10-CM | POA: Insufficient documentation

## 2013-12-05 DIAGNOSIS — Z8719 Personal history of other diseases of the digestive system: Secondary | ICD-10-CM | POA: Insufficient documentation

## 2013-12-05 DIAGNOSIS — R109 Unspecified abdominal pain: Secondary | ICD-10-CM

## 2013-12-05 DIAGNOSIS — G8929 Other chronic pain: Secondary | ICD-10-CM | POA: Insufficient documentation

## 2013-12-05 DIAGNOSIS — Z8742 Personal history of other diseases of the female genital tract: Secondary | ICD-10-CM | POA: Insufficient documentation

## 2013-12-05 DIAGNOSIS — Z8659 Personal history of other mental and behavioral disorders: Secondary | ICD-10-CM | POA: Insufficient documentation

## 2013-12-05 DIAGNOSIS — Z862 Personal history of diseases of the blood and blood-forming organs and certain disorders involving the immune mechanism: Secondary | ICD-10-CM | POA: Insufficient documentation

## 2013-12-05 DIAGNOSIS — R197 Diarrhea, unspecified: Secondary | ICD-10-CM | POA: Insufficient documentation

## 2013-12-05 DIAGNOSIS — Z9089 Acquired absence of other organs: Secondary | ICD-10-CM | POA: Insufficient documentation

## 2013-12-05 DIAGNOSIS — R112 Nausea with vomiting, unspecified: Secondary | ICD-10-CM | POA: Insufficient documentation

## 2013-12-05 DIAGNOSIS — R1084 Generalized abdominal pain: Secondary | ICD-10-CM | POA: Insufficient documentation

## 2013-12-05 DIAGNOSIS — Z8639 Personal history of other endocrine, nutritional and metabolic disease: Secondary | ICD-10-CM | POA: Insufficient documentation

## 2013-12-05 DIAGNOSIS — Z79899 Other long term (current) drug therapy: Secondary | ICD-10-CM | POA: Insufficient documentation

## 2013-12-05 DIAGNOSIS — R1031 Right lower quadrant pain: Secondary | ICD-10-CM | POA: Insufficient documentation

## 2013-12-05 DIAGNOSIS — Z791 Long term (current) use of non-steroidal anti-inflammatories (NSAID): Secondary | ICD-10-CM | POA: Insufficient documentation

## 2013-12-05 DIAGNOSIS — Z87442 Personal history of urinary calculi: Secondary | ICD-10-CM | POA: Insufficient documentation

## 2013-12-05 LAB — CBC WITH DIFFERENTIAL/PLATELET
Basophils Absolute: 0 10*3/uL (ref 0.0–0.1)
Basophils Relative: 0 % (ref 0–1)
EOS ABS: 0.2 10*3/uL (ref 0.0–0.7)
Eosinophils Relative: 1 % (ref 0–5)
HCT: 41.9 % (ref 36.0–46.0)
HEMOGLOBIN: 13.5 g/dL (ref 12.0–15.0)
LYMPHS ABS: 2.2 10*3/uL (ref 0.7–4.0)
Lymphocytes Relative: 15 % (ref 12–46)
MCH: 27.5 pg (ref 26.0–34.0)
MCHC: 32.2 g/dL (ref 30.0–36.0)
MCV: 85.3 fL (ref 78.0–100.0)
MONOS PCT: 6 % (ref 3–12)
Monocytes Absolute: 0.9 10*3/uL (ref 0.1–1.0)
NEUTROS PCT: 78 % — AB (ref 43–77)
Neutro Abs: 11.7 10*3/uL — ABNORMAL HIGH (ref 1.7–7.7)
Platelets: 219 10*3/uL (ref 150–400)
RBC: 4.91 MIL/uL (ref 3.87–5.11)
RDW: 13.3 % (ref 11.5–15.5)
WBC: 14.9 10*3/uL — ABNORMAL HIGH (ref 4.0–10.5)

## 2013-12-05 LAB — LIPASE, BLOOD: LIPASE: 18 U/L (ref 11–59)

## 2013-12-05 LAB — URINE MICROSCOPIC-ADD ON

## 2013-12-05 LAB — URINALYSIS, ROUTINE W REFLEX MICROSCOPIC
Bilirubin Urine: NEGATIVE
Glucose, UA: NEGATIVE mg/dL
HGB URINE DIPSTICK: NEGATIVE
Ketones, ur: NEGATIVE mg/dL
Nitrite: NEGATIVE
PH: 6 (ref 5.0–8.0)
Protein, ur: NEGATIVE mg/dL
Specific Gravity, Urine: 1.016 (ref 1.005–1.030)
Urobilinogen, UA: 0.2 mg/dL (ref 0.0–1.0)

## 2013-12-05 LAB — PREGNANCY, URINE: Preg Test, Ur: NEGATIVE

## 2013-12-05 LAB — COMPREHENSIVE METABOLIC PANEL
ALK PHOS: 46 U/L (ref 39–117)
ALT: 11 U/L (ref 0–35)
AST: 15 U/L (ref 0–37)
Albumin: 3.6 g/dL (ref 3.5–5.2)
BILIRUBIN TOTAL: 0.2 mg/dL — AB (ref 0.3–1.2)
BUN: 7 mg/dL (ref 6–23)
CO2: 24 mEq/L (ref 19–32)
Calcium: 9 mg/dL (ref 8.4–10.5)
Chloride: 104 mEq/L (ref 96–112)
Creatinine, Ser: 0.72 mg/dL (ref 0.50–1.10)
GLUCOSE: 90 mg/dL (ref 70–99)
POTASSIUM: 4.4 meq/L (ref 3.7–5.3)
Sodium: 143 mEq/L (ref 137–147)
Total Protein: 7.1 g/dL (ref 6.0–8.3)

## 2013-12-05 LAB — RPR

## 2013-12-05 LAB — WET PREP, GENITAL
CLUE CELLS WET PREP: NONE SEEN
Trich, Wet Prep: NONE SEEN
Yeast Wet Prep HPF POC: NONE SEEN

## 2013-12-05 LAB — HIV ANTIBODY (ROUTINE TESTING W REFLEX): HIV 1&2 Ab, 4th Generation: NONREACTIVE

## 2013-12-05 MED ORDER — MORPHINE SULFATE 4 MG/ML IJ SOLN
6.0000 mg | Freq: Once | INTRAMUSCULAR | Status: AC
  Administered 2013-12-05: 6 mg via INTRAVENOUS
  Filled 2013-12-05: qty 2

## 2013-12-05 MED ORDER — SODIUM CHLORIDE 0.9 % IV BOLUS (SEPSIS)
1000.0000 mL | Freq: Once | INTRAVENOUS | Status: AC
  Administered 2013-12-05: 1000 mL via INTRAVENOUS

## 2013-12-05 MED ORDER — ONDANSETRON HCL 4 MG/2ML IJ SOLN
4.0000 mg | Freq: Once | INTRAMUSCULAR | Status: AC
  Administered 2013-12-05: 4 mg via INTRAVENOUS
  Filled 2013-12-05: qty 2

## 2013-12-05 MED ORDER — ONDANSETRON 4 MG PO TBDP: 4.0000 mg | ORAL_TABLET | Freq: Three times a day (TID) | ORAL | Status: DC | PRN

## 2013-12-05 NOTE — Discharge Instructions (Signed)
Please follow up closely with your doctor further management of your recurrent stomach discomfort.  Take zofran for nausea.  Abdominal Pain, Women Abdominal (stomach, pelvic, or belly) pain can be caused by many things. It is important to tell your doctor:  The location of the pain.  Does it come and go or is it present all the time?  Are there things that start the pain (eating certain foods, exercise)?  Are there other symptoms associated with the pain (fever, nausea, vomiting, diarrhea)? All of this is helpful to know when trying to find the cause of the pain. CAUSES   Stomach: virus or bacteria infection, or ulcer.  Intestine: appendicitis (inflamed appendix), regional ileitis (Crohn's disease), ulcerative colitis (inflamed colon), irritable bowel syndrome, diverticulitis (inflamed diverticulum of the colon), or cancer of the stomach or intestine.  Gallbladder disease or stones in the gallbladder.  Kidney disease, kidney stones, or infection.  Pancreas infection or cancer.  Fibromyalgia (pain disorder).  Diseases of the female organs:  Uterus: fibroid (non-cancerous) tumors or infection.  Fallopian tubes: infection or tubal pregnancy.  Ovary: cysts or tumors.  Pelvic adhesions (scar tissue).  Endometriosis (uterus lining tissue growing in the pelvis and on the pelvic organs).  Pelvic congestion syndrome (female organs filling up with blood just before the menstrual period).  Pain with the menstrual period.  Pain with ovulation (producing an egg).  Pain with an IUD (intrauterine device, birth control) in the uterus.  Cancer of the female organs.  Functional pain (pain not caused by a disease, may improve without treatment).  Psychological pain.  Depression. DIAGNOSIS  Your doctor will decide the seriousness of your pain by doing an examination.  Blood tests.  X-rays.  Ultrasound.  CT scan (computed tomography, special type of X-ray).  MRI (magnetic  resonance imaging).  Cultures, for infection.  Barium enema (dye inserted in the large intestine, to better view it with X-rays).  Colonoscopy (looking in intestine with a lighted tube).  Laparoscopy (minor surgery, looking in abdomen with a lighted tube).  Major abdominal exploratory surgery (looking in abdomen with a large incision). TREATMENT  The treatment will depend on the cause of the pain.   Many cases can be observed and treated at home.  Over-the-counter medicines recommended by your caregiver.  Prescription medicine.  Antibiotics, for infection.  Birth control pills, for painful periods or for ovulation pain.  Hormone treatment, for endometriosis.  Nerve blocking injections.  Physical therapy.  Antidepressants.  Counseling with a psychologist or psychiatrist.  Minor or major surgery. HOME CARE INSTRUCTIONS   Do not take laxatives, unless directed by your caregiver.  Take over-the-counter pain medicine only if ordered by your caregiver. Do not take aspirin because it can cause an upset stomach or bleeding.  Try a clear liquid diet (broth or water) as ordered by your caregiver. Slowly move to a bland diet, as tolerated, if the pain is related to the stomach or intestine.  Have a thermometer and take your temperature several times a day, and record it.  Bed rest and sleep, if it helps the pain.  Avoid sexual intercourse, if it causes pain.  Avoid stressful situations.  Keep your follow-up appointments and tests, as your caregiver orders.  If the pain does not go away with medicine or surgery, you may try:  Acupuncture.  Relaxation exercises (yoga, meditation).  Group therapy.  Counseling. SEEK MEDICAL CARE IF:   You notice certain foods cause stomach pain.  Your home care treatment is not  helping your pain.  You need stronger pain medicine.  You want your IUD removed.  You feel faint or lightheaded.  You develop nausea and  vomiting.  You develop a rash.  You are having side effects or an allergy to your medicine. SEEK IMMEDIATE MEDICAL CARE IF:   Your pain does not go away or gets worse.  You have a fever.  Your pain is felt only in portions of the abdomen. The right side could possibly be appendicitis. The left lower portion of the abdomen could be colitis or diverticulitis.  You are passing blood in your stools (bright red or black tarry stools, with or without vomiting).  You have blood in your urine.  You develop chills, with or without a fever.  You pass out. MAKE SURE YOU:   Understand these instructions.  Will watch your condition.  Will get help right away if you are not doing well or get worse. Document Released: 03/28/2007 Document Revised: 08/23/2011 Document Reviewed: 04/17/2009 [facility] Patient Information 2015 [place], [place]. This information is not intended to replace advice given to you by your health care provider. Make sure you discuss any questions you have with your health care provider.

## 2013-12-05 NOTE — ED Provider Notes (Signed)
CSN: [id number]     Arrival date & time 12/05/13  1027 History   First MD Initiated Contact with Patient 12/05/13 1105     Chief Complaint  Patient presents with  . Abdominal Pain     (Consider location/radiation/quality/duration/timing/severity/associated sxs/prior Treatment) HPI  28 year old female with history of anxiety, basedow's disease, GERD, chronic back pain, uterine fibroid who presents for evaluation of abdominal pain.  She patient report having right lower quadrant abdominal pain ongoing for nearly 4 months. However for the past 3 days she has been having worsening colicky pain to the right lower quadrant, nonradiating, 10 out of 10. Endorse nausea and has vomited 4-5 times of nonbloody nonbilious vomitus. Also reports having nonmucous, nonbloody diarrhea at least 7 times daily for the past 3 days. Endorse chills without fever. Worsening abdominal pain with movement and with breathing. Denies any dysuria, vaginal discharge or GU complaints. She has tried taking her home medication including Percocet and Tylenol with some improvement the pain returns. Her last menstrual period was June 6.  Report decreased in appetite.     Past Medical History  Diagnosis Date  . Gastric reflux   . Anxiety   . Basedow's disease   . Pyelonephritis   . History of nephrolithiasis   . Anxiety disorder   . Bipolar disorder   . Interstitial cystitis   . Chronic back pain   . Fibroid    Past Surgical History  Procedure Laterality Date  . Tonsillectomy    . Adenoidectomy    . Tear duct probing     Family History  Problem Relation Age of Onset  . Diabetes    . Hypertension    . Lung cancer    . Bipolar disorder Sister    History  Substance Use Topics  . Smoking status: Current Some Day Smoker -- 0.25 packs/day for 8 years    Types: Cigarettes  . Smokeless tobacco: Never Used  . Alcohol Use: No     Comment: Couple months since last use of alcohol states pt.  Pt reports that she would  drink 1/2 of a fifth to take away emotional pain.    OB History   Grav Para Term Preterm Abortions TAB SAB Ect Mult Living   1 1 1       1      Review of Systems  All other systems reviewed and are negative.     Allergies  Review of patient's allergies indicates no known allergies.  Home Medications   Prior to Admission medications   Medication Sig Start Date End Date Taking? Authorizing Provider  ibuprofen (ADVIL,MOTRIN) 200 MG tablet Take 400 mg by mouth every 6 (six) hours as needed for mild pain.   Yes Historical Provider, MD  levonorgestrel-ethinyl estradiol (AVIANE,ALESSE,LESSINA) 0.1-20 MG-MCG tablet Take 1 tablet by mouth daily.   Yes Historical Provider, MD  ondansetron (ZOFRAN ODT) 4 MG disintegrating tablet Take 1 tablet (4 mg total) by mouth every 8 (eight) hours as needed for nausea. 11/28/13  Yes Lucila Maine, PA-C  oxyCODONE-acetaminophen (PERCOCET/ROXICET) 5-325 MG per tablet Take 1-2 tablets by mouth every 4 (four) hours as needed for severe pain. 11/28/13  Yes Carlos American Palmer, PA-C   BP 113/69  Pulse 62  Temp(Src) 98.2 F (36.8 C) (Oral)  Resp 20  SpO2 99%  LMP 11/17/2013 Physical Exam  Nursing note and vitals reviewed. Constitutional: She is oriented to person, place, and time. She appears well-developed and well-nourished. No distress.  HENT:  Head: Atraumatic.  Eyes: Conjunctivae are normal.  Neck: Neck supple.  Cardiovascular: Normal rate and regular rhythm.   Pulmonary/Chest: Effort normal and breath sounds normal.  Abdominal: Soft. There is tenderness (Generalized abdominal tenderness most significant to right lower quadrant with guarding but without rebound tenderness. Abdomen otherwise soft, no peritoneal signs.). Hernia confirmed negative in the right inguinal area and confirmed negative in the left inguinal area.  Genitourinary: Uterus normal. Cervix exhibits no motion tenderness, no discharge and no friability. Right adnexum displays no  tenderness and no fullness. Left adnexum displays no tenderness and no fullness. No tenderness or bleeding around the vagina. No foreign body around the vagina. No vaginal discharge found.  Chaperone present:  Musculoskeletal: She exhibits no edema.  Lymphadenopathy:       Right: No inguinal adenopathy present.       Left: No inguinal adenopathy present.  Neurological: She is alert and oriented to person, place, and time.  Skin: No rash noted.  Psychiatric: She has a normal mood and affect.    ED Course  Procedures (including critical care time)  1:21 PM Pt here with acute on chronic lower abdominal pain. No discomfort on pelvic exam.  Does have elevated WBC of 14.9 but is afebrile.  She has had multiple abd [place] along with abd/pelvis CT scan in the past as recent as 1 week ago without acute abnormalities.  Wet prep result unremarkable.    Given multiple visits for same and extensive work up in the past without acute abnormalities i have consulted with Dr. Roderic Palau, who recommend against advance imaging and to have pt f/u with PCP for further care.  Pt agrees with plan.  She tolerates PO, will d/c with antinausea meds and return precaution discussed.    Labs Review Labs Reviewed  WET PREP, GENITAL - Abnormal; Notable for the following:    WBC, Wet Prep HPF POC FEW (*)    All other components within normal limits  URINALYSIS, ROUTINE W REFLEX MICROSCOPIC - Abnormal; Notable for the following:    APPearance TURBID (*)    Leukocytes, UA SMALL (*)    All other components within normal limits  CBC WITH DIFFERENTIAL - Abnormal; Notable for the following:    WBC 14.9 (*)    Neutrophils Relative % 78 (*)    Neutro Abs 11.7 (*)    All other components within normal limits  COMPREHENSIVE METABOLIC PANEL - Abnormal; Notable for the following:    Total Bilirubin 0.2 (*)    All other components within normal limits  URINE MICROSCOPIC-ADD ON - Abnormal; Notable for the following:    Bacteria, UA  FEW (*)    All other components within normal limits  GC/CHLAMYDIA PROBE AMP  PREGNANCY, URINE  LIPASE, BLOOD  RPR  HIV ANTIBODY (ROUTINE TESTING)    Imaging Review No results found.   EKG Interpretation None      MDM   Final diagnoses:  Chronic abdominal pain  Nausea vomiting and diarrhea    BP 109/59  Pulse 59  Temp(Src) 98.1 F (36.7 C) (Oral)  Resp 14  SpO2 99%  LMP 11/17/2013  I have reviewed nursing notes and vital signs. I personally reviewed the imaging tests through PACS system  I reviewed available ER/hospitalization records thought the EMR     Domenic Moras, [place] 12/06/13 1103

## 2013-12-05 NOTE — ED Notes (Signed)
Pt was seen last week for same.  C/o rt sided abd pain w/ vomiting and diarrhea. Pain has been going on for almost 4 months.

## 2013-12-06 LAB — GC/CHLAMYDIA PROBE AMP
CT Probe RNA: NEGATIVE
GC Probe RNA: NEGATIVE

## 2013-12-06 NOTE — ED Provider Notes (Signed)
Medical screening examination/treatment/procedure(s) were performed by non-physician practitioner and as supervising physician I was immediately available for consultation/collaboration.   EKG Interpretation None        Maudry Diego, MD 12/06/13 1108

## 2013-12-13 ENCOUNTER — Encounter: Payer: Self-pay | Admitting: Family Medicine

## 2013-12-13 ENCOUNTER — Ambulatory Visit (INDEPENDENT_AMBULATORY_CARE_PROVIDER_SITE_OTHER): Payer: 59 | Admitting: Family Medicine

## 2013-12-13 VITALS — BP 110/68 | HR 94 | Temp 98.5°F | Wt 165.2 lb

## 2013-12-13 DIAGNOSIS — R1011 Right upper quadrant pain: Secondary | ICD-10-CM

## 2013-12-13 DIAGNOSIS — B9689 Other specified bacterial agents as the cause of diseases classified elsewhere: Secondary | ICD-10-CM

## 2013-12-13 DIAGNOSIS — A499 Bacterial infection, unspecified: Secondary | ICD-10-CM

## 2013-12-13 DIAGNOSIS — N760 Acute vaginitis: Secondary | ICD-10-CM

## 2013-12-13 LAB — URINALYSIS, ROUTINE W REFLEX MICROSCOPIC
BILIRUBIN URINE: NEGATIVE
Ketones, ur: NEGATIVE
NITRITE: NEGATIVE
Specific Gravity, Urine: <=1.005 — AB (ref 1.000–1.030)
Total Protein, Urine: NEGATIVE
Urine Glucose: NEGATIVE
Urobilinogen, UA: 0.2 (ref 0.0–1.0)
pH: 6 (ref 5.0–8.0)

## 2013-12-13 LAB — CBC WITH DIFFERENTIAL/PLATELET
Basophils Absolute: 0 10*3/uL (ref 0.0–0.1)
Basophils Relative: 0.4 % (ref 0.0–3.0)
EOS PCT: 1.9 % (ref 0.0–5.0)
Eosinophils Absolute: 0.2 10*3/uL (ref 0.0–0.7)
HEMATOCRIT: 42.4 % (ref 36.0–46.0)
HEMOGLOBIN: 13.8 g/dL (ref 12.0–15.0)
LYMPHS ABS: 2.1 10*3/uL (ref 0.7–4.0)
Lymphocytes Relative: 16.5 % (ref 12.0–46.0)
MCHC: 32.4 g/dL (ref 30.0–36.0)
MCV: 85.3 fl (ref 78.0–100.0)
Monocytes Absolute: 0.7 10*3/uL (ref 0.1–1.0)
Monocytes Relative: 5.6 % (ref 3.0–12.0)
NEUTROS PCT: 75.6 % (ref 43.0–77.0)
Neutro Abs: 9.5 10*3/uL — ABNORMAL HIGH (ref 1.4–7.7)
Platelets: 230 10*3/uL (ref 150.0–400.0)
RBC: 4.97 Mil/uL (ref 3.87–5.11)
RDW: 13.6 % (ref 11.5–15.5)
WBC: 12.5 10*3/uL — AB (ref 4.0–10.5)

## 2013-12-13 LAB — H. PYLORI ANTIBODY, IGG: H Pylori IgG: NEGATIVE

## 2013-12-13 LAB — POCT URINE PREGNANCY: Preg Test, Ur: NEGATIVE

## 2013-12-13 NOTE — Progress Notes (Signed)
Pre visit review using our clinic review tool, if applicable. No additional management support is needed unless otherwise documented below in the visit note. 

## 2013-12-13 NOTE — Progress Notes (Signed)
  Subjective:     [name] is a 28 y.o. female who presents for evaluation of abdominal pain. Onset was several days ago. Symptoms have been gradually worsening. The pain is described as sharp, and is 8/10 in intensity. Pain is located in the RUQ without radiation.  Aggravating factors: eating.  Alleviating factors: none. Associated symptoms: none. The patient denies anorexia, arthralagias, belching, chills, constipation, diarrhea, dysuria, fever, flatus, frequency, headache, hematochezia, hematuria, melena, myalgias, nausea, sweats and vomiting.  The patient's history has been marked as reviewed and updated as appropriate.  Review of Systems Pertinent items are noted in HPI.     Objective:    BP 110/68  Pulse 94  Temp(Src) 98.5 F (36.9 C) (Oral)  Wt 165 lb 3.2 oz (74.934 kg)  SpO2 99%  LMP 11/17/2013  Breastfeeding? No General appearance: alert, cooperative, appears stated age and no distress Throat: lips, mucosa, and tongue normal; teeth and gums normal Neck: no adenopathy, no carotid bruit, no JVD, supple, symmetrical, trachea midline and thyroid not enlarged, symmetric, no tenderness/mass/nodules Lungs: clear to auscultation bilaterally Heart: S1, S2 normal Abdomen: abnormal findings:  moderate tenderness in the RUQ , soft   Assessment:    Abdominal pain, likely secondary to GB--- [place] in Er neg .    Plan:    The diagnosis was discussed with the patient and evaluation and treatment plans outlined. See orders for lab and imaging studies. Further follow-up plans will be based on outcome of lab/imaging studies; see orders.   1. RUQ pain R/u GB,  Wbc elevated in ER  And hx BV--- recheck today - NM Hepatobiliary; Future - CBC with Differential - POCT urine pregnancy - H. pylori antibody, IgG  2. Bacterial vaginitis Recheck urine - Urinalysis, Routine w reflex microscopic

## 2013-12-13 NOTE — Patient Instructions (Signed)

## 2013-12-18 ENCOUNTER — Other Ambulatory Visit: Payer: Self-pay

## 2013-12-18 MED ORDER — CEPHALEXIN 500 MG PO CAPS: 500.0000 mg | ORAL_CAPSULE | Freq: Two times a day (BID) | ORAL | Status: DC

## 2013-12-19 ENCOUNTER — Telehealth: Payer: Self-pay | Admitting: Family Medicine

## 2013-12-19 ENCOUNTER — Ambulatory Visit (HOSPITAL_BASED_OUTPATIENT_CLINIC_OR_DEPARTMENT_OTHER)
Admission: RE | Admit: 2013-12-19 | Discharge: 2013-12-19 | Disposition: A | Discharge status: Home / Self Care | Payer: 59 | Source: Ambulatory Visit | Attending: Family Medicine | Admitting: Family Medicine

## 2013-12-19 ENCOUNTER — Encounter (HOSPITAL_BASED_OUTPATIENT_CLINIC_OR_DEPARTMENT_OTHER): Payer: Self-pay

## 2013-12-19 ENCOUNTER — Other Ambulatory Visit: Payer: Self-pay | Admitting: General Practice

## 2013-12-19 DIAGNOSIS — R1011 Right upper quadrant pain: Secondary | ICD-10-CM | POA: Insufficient documentation

## 2013-12-19 DIAGNOSIS — R112 Nausea with vomiting, unspecified: Secondary | ICD-10-CM | POA: Insufficient documentation

## 2013-12-19 MED ORDER — ONDANSETRON 4 MG PO TBDP: 4.0000 mg | ORAL_TABLET | Freq: Three times a day (TID) | ORAL | Status: DC | PRN

## 2013-12-19 MED ORDER — IOHEXOL 300 MG/ML  SOLN
100.0000 mL | Freq: Once | INTRAMUSCULAR | Status: AC | PRN
  Administered 2013-12-19: 100 mL via INTRAVENOUS

## 2013-12-19 MED ORDER — OXYCODONE-ACETAMINOPHEN 5-325 MG PO TABS: 1.0000 | ORAL_TABLET | ORAL | Status: DC | PRN

## 2013-12-19 NOTE — Telephone Encounter (Signed)
Spoke with patient. Ask patient if she exhibited any symptoms of elevated temp, chill, some nausea and vomiting. Patient came into office and pick up Rx and showed no signs of immediate distress. Referral is in process.

## 2013-12-19 NOTE — Telephone Encounter (Signed)
Spoke with patient advised the following:   Nausea/vomiting is after meals Diarrhea is after meals Unsure how many macrobid she has due to not being home, advised to pick up keflex if not #14.  zofran sent to pharmacy  Pt would like surgery referral, due to sever pain. Also wants to know if oxycodone can be refilled. Last filled 11/29/13 #15 with 0. Told pt would find out if can be filled without appt.

## 2013-12-19 NOTE — Telephone Encounter (Signed)
Please enter urgent surgical referral due to RUQ pain w/ nausea and vomiting after eating.  Also needs order for CT scan abd w/ contrast to r/o cholecystitis.  If pain worsens, go to ER.  [place] for oxy #15

## 2013-12-19 NOTE — Telephone Encounter (Signed)
Caller name: [place]  Call back [phone] Pharmacy:  Reason for call:  Pt was prescribed RX cephALEXin (KEFLEX) 500 MG but has other medications from when she was preg.  Pt wants to know if she can take those instead.  Pt also states that she is having diarrhea again and side pain, on upper right side.  Please advise pt.

## 2013-12-19 NOTE — Telephone Encounter (Signed)
Macrobid would likely treat UTI but needs to have complete 7 day course (14 pills).  If she doesn't have 14 pills available, needs to pick up Keflex script to treat completely. As for nausea, pt can have Zofran 4mg  TID prn #20, no refills BUT if RUQ pain is severe or worsens, needs to be evaluated in office, UC or ER.  Pt's sxs sound consistent w/ gallbladder, nuclear scan isn't until later this month.  Ok for surgical referral if pt is in agreement.

## 2013-12-19 NOTE — Telephone Encounter (Signed)
Diarrhea is not consistent w/ gallbladder and usually nausea occurs after eating (if gallbladder related).  There may be other issue at play.  If pain does not improve w/ treatment of UTI and/or Zofran for nausea, will need CT scan.

## 2013-12-19 NOTE — Telephone Encounter (Signed)
Spoke with pt who advised she has macrobid and levaquin at home. Would either of those help? Pt states that she had diarrhea starting today, has also been throwing up, and only has RUQ pain. Please advise. Pt had been seen on 7/2 by [place].

## 2013-12-19 NOTE — Telephone Encounter (Signed)
Referral placed, med filled and ct ordered.

## 2013-12-20 ENCOUNTER — Telehealth: Payer: Self-pay

## 2013-12-20 NOTE — Telephone Encounter (Signed)
Advised patient of results. Has a appt with surgeon next week.

## 2013-12-20 NOTE — Telephone Encounter (Signed)
Message copied by Reino Bellis on Thu Dec 20, 2013 10:06 AM ------      Message from: Midge Minium      Created: Wed Dec 19, 2013  9:08 PM       No evidence of intraabdominal process- specifically no gallbladder abnormality noted. ------

## 2013-12-20 NOTE — Telephone Encounter (Signed)
LM to return call.

## 2013-12-26 ENCOUNTER — Encounter (INDEPENDENT_AMBULATORY_CARE_PROVIDER_SITE_OTHER): Payer: Self-pay | Admitting: Surgery

## 2013-12-26 ENCOUNTER — Ambulatory Visit (INDEPENDENT_AMBULATORY_CARE_PROVIDER_SITE_OTHER): Payer: 59 | Admitting: Surgery

## 2013-12-26 VITALS — BP 118/62 | HR 60 | Temp 98.0°F | Resp 18 | Ht 65.0 in | Wt 164.0 lb

## 2013-12-26 DIAGNOSIS — R1011 Right upper quadrant pain: Secondary | ICD-10-CM

## 2013-12-26 NOTE — Progress Notes (Signed)
[facility]  Chief Complaint  Patient presents with  . New Evaluation    RUQ abdominal pain - referral from Dr. Annye Asa    HISTORY: Patient is a 28 year old female referred by her primary care physician with right upper quadrant abdominal pain. Pain has been intermittent since her recent pregnancy and the birth of her child in March 2015. Recently she has had more persistent right upper quadrant pain. This is associated with meals but not to any particular type of food. She has had episodes of nausea and occasional emesis. She notes diarrhea. She denies fever.  Evaluation has included an abdominal CT scan which showed no acute findings. An abdominal ultrasound was obtained which was normal. Patient is scheduled for a nuclear medicine hepatobiliary scan next week.  Patient has had no prior abdominal surgery. Patient notes that her mother had gallbladder disease and required cholecystectomy.  Past Medical History  Diagnosis Date  . Gastric reflux   . Anxiety   . Basedow's disease   . Pyelonephritis   . History of nephrolithiasis   . Anxiety disorder   . Bipolar disorder   . Interstitial cystitis   . Chronic back pain   . Fibroid     Current Outpatient Prescriptions  Medication Sig Dispense Refill  . ibuprofen (ADVIL,MOTRIN) 200 MG tablet Take 400 mg by mouth every 6 (six) hours as needed for mild pain.      Marland Kitchen levonorgestrel-ethinyl estradiol (AVIANE,ALESSE,LESSINA) 0.1-20 MG-MCG tablet Take 1 tablet by mouth daily.      . ondansetron (ZOFRAN-ODT) 4 MG disintegrating tablet Take 1 tablet (4 mg total) by mouth every 8 (eight) hours as needed for nausea or vomiting.  20 tablet  0  . oxyCODONE-acetaminophen (PERCOCET/ROXICET) 5-325 MG per tablet Take 1-2 tablets by mouth every 4 (four) hours as needed for severe pain.  15 tablet  0  . cephALEXin (KEFLEX) 500 MG capsule Take 1 capsule (500 mg total) by mouth 2 (two) times daily.  10 capsule  0    No current facility-administered medications for this visit.    No Known Allergies  Family History  Problem Relation Age of Onset  . Diabetes    . Hypertension    . Lung cancer    . Bipolar disorder Sister     History   Social History  . Marital Status: Single    Spouse Name: N/A    Number of Children: N/A  . Years of Education: N/A   Occupational History  . driver     [employer]   Social History Main Topics  . Smoking status: Current Some Day Smoker -- 0.25 packs/day for 8 years    Types: Cigarettes  . Smokeless tobacco: Never Used  . Alcohol Use: No     Comment: Couple months since last use of alcohol states pt.  Pt reports that she would drink 1/2 of a fifth to take away emotional pain.   . Drug Use: No  . Sexual Activity: Yes    Birth Control/ Protection: None   Other Topics Concern  . None   Social History Narrative  . None    REVIEW OF SYSTEMS - PERTINENT POSITIVES ONLY: Denies jaundice. Denies acholic stools. Recent episodes of diarrhea. No fever.  EXAM: Filed Vitals:   12/26/13 0859  BP: 118/62  Pulse: 60  Temp: 98 F (36.7 C)  Resp: 18    GENERAL: well-developed, well-nourished, no acute distress HEENT: normocephalic; pupils equal and reactive;  sclerae clear; dentition good; mucous membranes moist NECK:  No palpable masses in the thyroid bed; symmetric on extension; no palpable anterior or posterior cervical lymphadenopathy; no supraclavicular masses; no tenderness CHEST: clear to auscultation bilaterally without rales, rhonchi, or wheezes CARDIAC: regular rate and rhythm without significant murmur; peripheral pulses are full ABDOMEN: soft without distension; bowel sounds present; no mass; no hepatosplenomegaly; no hernia; mild tenderness right upper quadrant with voluntary guarding, no palpable mass, no flank tenderness EXT:  non-tender without edema; no deformity NEURO: no gross focal deficits; no sign of tremor   LABORATORY  RESULTS: See [facility] (CHL-Epic) for most recent results  RADIOLOGY RESULTS: See [facility] (CHL-Epic) for most recent results  IMPRESSION: Right upper quadrant abdominal pain of uncertain etiology  PLAN: I discussed the above findings at length with the patient. I provided her with written literature on hepatobiliary disease and gallbladder surgery to review at home.  Patient will have a nuclear medicine hepatobiliary scan early next week. If the results are abnormal, then I believe she may benefit from cholecystectomy. We discussed laparoscopic cholecystectomy at length. Again I provided her with written literature to review. I described the procedure. We discussed intraoperative cholangiography. 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  If the nuclear medicine hepatobiliary scan is normal, then I believe she will require further evaluation. She has seen Dr. Oretha Caprice in the past. Approximately 6 years ago she had an upper endoscopy and colonoscopy performed by him. If her hepatobiliary scan is normal, then I would make referral back to Dr. Ardis Hughs for further assessment.  We will await the results of the hepatobiliary scan and then proceed as outlined above.  Earnstine Regal, MD, [facility]  Primary Care Physician: Garnet Koyanagi, DO

## 2013-12-26 NOTE — Patient Instructions (Signed)
  [facility]  LAPAROSCOPIC SURGERY - POST-OP INSTRUCTIONS  Always review your discharge instruction sheet given to you by the facility where your surgery was performed.  A prescription for pain medication may be given to you upon discharge.  Take your pain medication as prescribed.  If narcotic pain medicine is not needed, then you may take acetaminophen (Tylenol) or ibuprofen (Advil) as needed.  Take your usually prescribed medications unless otherwise directed.  If you need a refill on your pain medication, please contact your pharmacy.  They will contact our office to request authorization. Prescriptions will not be filled after 5 P.M. or on weekends.  You should follow a light diet the first few days after arrival home, such as soup and crackers or toast.  Be sure to include plenty of fluids daily.  Most patients will experience some swelling and bruising in the area of the incisions.  Ice packs will help.  Swelling and bruising can take several days to resolve.   It is common to experience some constipation if taking pain medication after surgery.  Increasing fluid intake and taking a stool softener (such as Colace) will usually help or prevent this problem from occurring.  A mild laxative (Milk of Magnesia or Miralax) should be taken according to package instructions if there are no bowel movements after 48 hours.  Unless discharge instructions indicate otherwise, you may remove your bandages 24-48 hours after surgery, and you may shower at that time.  You may have steri-strips (small skin tapes) in place directly over the incision.  These strips should be left on the skin for 7-10 days.  If your surgeon used skin glue on the incision, you may shower in 24 hours.  The glue will flake off over the next 2-3 weeks.  Any sutures or staples will be removed at the office during your follow-up visit.  ACTIVITIES:  You may resume regular (light) daily activities beginning the  next day-such as daily self-care, walking, climbing stairs-gradually increasing activities as tolerated.  You may have sexual intercourse when it is comfortable.  Refrain from any heavy lifting or straining until approved by your doctor.  You may drive when you are no longer taking prescription pain medication, you can comfortably wear a seatbelt, and you can safely maneuver your car and apply brakes.  You should see your doctor in the office for a follow-up appointment approximately 2-3 weeks after your surgery.  Make sure that you call for this appointment within a day or two after you arrive home to insure a convenient appointment time.  WHEN TO CALL YOUR DOCTOR: 1. Fever over 101.0 2. Inability to urinate 3. Continued bleeding from incision 4. Increased pain, redness, or drainage from the incision 5. Increasing abdominal pain  The clinic staff is available to answer your questions during regular business hours.  Please don't hesitate to call and ask to speak to one of the nurses for clinical concerns.  If you have a medical emergency, go to the nearest emergency room or call 911.  A surgeon from [facility] is always on call for the hospital.  [name] M. Monica Tsou, MD, FACS [facility] Office: [phone] Toll Free:  [phone] FAX [phone]  Web site: [email or web address] 

## 2014-01-01 ENCOUNTER — Telehealth (INDEPENDENT_AMBULATORY_CARE_PROVIDER_SITE_OTHER): Payer: Self-pay | Admitting: Surgery

## 2014-01-01 ENCOUNTER — Ambulatory Visit (HOSPITAL_COMMUNITY): Payer: 59

## 2014-01-01 ENCOUNTER — Encounter (HOSPITAL_COMMUNITY): Payer: Self-pay

## 2014-01-01 ENCOUNTER — Emergency Department (HOSPITAL_COMMUNITY)
Admission: EM | Admit: 2014-01-01 | Discharge: 2014-01-01 | Disposition: A | Discharge status: Home / Self Care | Payer: 59 | Attending: Emergency Medicine | Admitting: Emergency Medicine

## 2014-01-01 ENCOUNTER — Encounter (HOSPITAL_COMMUNITY)
Admission: RE | Admit: 2014-01-01 | Discharge: 2014-01-01 | Disposition: A | Discharge status: Home / Self Care | Payer: 59 | Source: Ambulatory Visit | Attending: Family Medicine | Admitting: Family Medicine

## 2014-01-01 ENCOUNTER — Encounter (HOSPITAL_COMMUNITY): Payer: Self-pay | Admitting: Emergency Medicine

## 2014-01-01 ENCOUNTER — Other Ambulatory Visit (INDEPENDENT_AMBULATORY_CARE_PROVIDER_SITE_OTHER): Payer: Self-pay | Admitting: Surgery

## 2014-01-01 VITALS — Wt 164.0 lb

## 2014-01-01 DIAGNOSIS — R1011 Right upper quadrant pain: Secondary | ICD-10-CM | POA: Insufficient documentation

## 2014-01-01 DIAGNOSIS — G8929 Other chronic pain: Secondary | ICD-10-CM | POA: Insufficient documentation

## 2014-01-01 DIAGNOSIS — R112 Nausea with vomiting, unspecified: Secondary | ICD-10-CM | POA: Insufficient documentation

## 2014-01-01 DIAGNOSIS — Z792 Long term (current) use of antibiotics: Secondary | ICD-10-CM | POA: Diagnosis not present

## 2014-01-01 DIAGNOSIS — K828 Other specified diseases of gallbladder: Secondary | ICD-10-CM

## 2014-01-01 DIAGNOSIS — Z3202 Encounter for pregnancy test, result negative: Secondary | ICD-10-CM | POA: Diagnosis not present

## 2014-01-01 DIAGNOSIS — Z8742 Personal history of other diseases of the female genital tract: Secondary | ICD-10-CM | POA: Diagnosis not present

## 2014-01-01 DIAGNOSIS — F17200 Nicotine dependence, unspecified, uncomplicated: Secondary | ICD-10-CM | POA: Insufficient documentation

## 2014-01-01 DIAGNOSIS — Z8719 Personal history of other diseases of the digestive system: Secondary | ICD-10-CM | POA: Diagnosis not present

## 2014-01-01 DIAGNOSIS — Z79899 Other long term (current) drug therapy: Secondary | ICD-10-CM | POA: Insufficient documentation

## 2014-01-01 DIAGNOSIS — R197 Diarrhea, unspecified: Secondary | ICD-10-CM | POA: Diagnosis not present

## 2014-01-01 DIAGNOSIS — F411 Generalized anxiety disorder: Secondary | ICD-10-CM | POA: Diagnosis not present

## 2014-01-01 LAB — COMPREHENSIVE METABOLIC PANEL
ALBUMIN: 3.8 g/dL (ref 3.5–5.2)
ALK PHOS: 52 U/L (ref 39–117)
ALT: 19 U/L (ref 0–35)
AST: 18 U/L (ref 0–37)
Anion gap: 15 (ref 5–15)
BUN: 8 mg/dL (ref 6–23)
CO2: 23 mEq/L (ref 19–32)
Calcium: 9.1 mg/dL (ref 8.4–10.5)
Chloride: 99 mEq/L (ref 96–112)
Creatinine, Ser: 0.72 mg/dL (ref 0.50–1.10)
GFR calc Af Amer: >90 mL/min (ref >90)
GFR calc non Af Amer: >90 mL/min (ref >90)
Glucose, Bld: 101 mg/dL — ABNORMAL HIGH (ref 70–99)
POTASSIUM: 4.5 meq/L (ref 3.7–5.3)
SODIUM: 137 meq/L (ref 137–147)
TOTAL PROTEIN: 7.6 g/dL (ref 6.0–8.3)
Total Bilirubin: <0.2 mg/dL — ABNORMAL LOW (ref 0.3–1.2)

## 2014-01-01 LAB — CBC WITH DIFFERENTIAL/PLATELET
BASOS PCT: 0 % (ref 0–1)
Basophils Absolute: 0 10*3/uL (ref 0.0–0.1)
EOS ABS: 0.1 10*3/uL (ref 0.0–0.7)
Eosinophils Relative: 1 % (ref 0–5)
HCT: 40 % (ref 36.0–46.0)
Hemoglobin: 13.1 g/dL (ref 12.0–15.0)
Lymphocytes Relative: 32 % (ref 12–46)
Lymphs Abs: 3.3 10*3/uL (ref 0.7–4.0)
MCH: 27.2 pg (ref 26.0–34.0)
MCHC: 32.8 g/dL (ref 30.0–36.0)
MCV: 83.2 fL (ref 78.0–100.0)
Monocytes Absolute: 0.7 10*3/uL (ref 0.1–1.0)
Monocytes Relative: 7 % (ref 3–12)
NEUTROS PCT: 60 % (ref 43–77)
Neutro Abs: 6.1 10*3/uL (ref 1.7–7.7)
Platelets: 239 10*3/uL (ref 150–400)
RBC: 4.81 MIL/uL (ref 3.87–5.11)
RDW: 13.1 % (ref 11.5–15.5)
WBC: 10.3 10*3/uL (ref 4.0–10.5)

## 2014-01-01 LAB — URINALYSIS, ROUTINE W REFLEX MICROSCOPIC
Bilirubin Urine: NEGATIVE
GLUCOSE, UA: NEGATIVE mg/dL
Hgb urine dipstick: NEGATIVE
Ketones, ur: NEGATIVE mg/dL
LEUKOCYTES UA: NEGATIVE
Nitrite: NEGATIVE
PH: 8 (ref 5.0–8.0)
Protein, ur: NEGATIVE mg/dL
Specific Gravity, Urine: 1.014 (ref 1.005–1.030)
Urobilinogen, UA: 0.2 mg/dL (ref 0.0–1.0)

## 2014-01-01 LAB — PREGNANCY, URINE: Preg Test, Ur: NEGATIVE

## 2014-01-01 LAB — LIPASE, BLOOD: Lipase: 24 U/L (ref 11–59)

## 2014-01-01 LAB — I-STAT CG4 LACTIC ACID, ED: Lactic Acid, Venous: 1.44 mmol/L (ref 0.5–2.2)

## 2014-01-01 MED ORDER — KETOROLAC TROMETHAMINE 30 MG/ML IJ SOLN
30.0000 mg | Freq: Once | INTRAMUSCULAR | Status: AC
  Administered 2014-01-01: 30 mg via INTRAVENOUS
  Filled 2014-01-01: qty 1

## 2014-01-01 MED ORDER — MORPHINE SULFATE 4 MG/ML IJ SOLN
4.0000 mg | Freq: Once | INTRAMUSCULAR | Status: AC
  Administered 2014-01-01: 4 mg via INTRAVENOUS
  Filled 2014-01-01: qty 1

## 2014-01-01 MED ORDER — SODIUM CHLORIDE 0.9 % IV BOLUS (SEPSIS)
1000.0000 mL | Freq: Once | INTRAVENOUS | Status: AC
  Administered 2014-01-01: 1000 mL via INTRAVENOUS

## 2014-01-01 MED ORDER — FENTANYL CITRATE 0.05 MG/ML IJ SOLN
100.0000 ug | Freq: Once | INTRAMUSCULAR | Status: AC
  Administered 2014-01-01: 100 ug via INTRAVENOUS
  Filled 2014-01-01: qty 2

## 2014-01-01 MED ORDER — ONDANSETRON HCL 4 MG/2ML IJ SOLN
4.0000 mg | Freq: Once | INTRAMUSCULAR | Status: AC
  Administered 2014-01-01: 4 mg via INTRAVENOUS
  Filled 2014-01-01: qty 2

## 2014-01-01 MED ORDER — TECHNETIUM TC 99M MEBROFENIN IV KIT
5.0000 | PACK | Freq: Once | INTRAVENOUS | Status: AC | PRN
  Administered 2014-01-01: 5 via INTRAVENOUS

## 2014-01-01 MED ORDER — ONDANSETRON 4 MG PO TBDP: 4.0000 mg | ORAL_TABLET | Freq: Three times a day (TID) | ORAL | Status: DC | PRN

## 2014-01-01 MED ORDER — SINCALIDE 5 MCG IJ SOLR: 0.0200 ug/kg | Freq: Once | INTRAMUSCULAR | Status: DC

## 2014-01-01 MED ORDER — METOCLOPRAMIDE HCL 5 MG/ML IJ SOLN
10.0000 mg | INTRAMUSCULAR | Status: AC
  Administered 2014-01-01: 10 mg via INTRAVENOUS
  Filled 2014-01-01: qty 2

## 2014-01-01 MED ORDER — IBUPROFEN 600 MG PO TABS: 600.0000 mg | ORAL_TABLET | Freq: Four times a day (QID) | ORAL | Status: DC | PRN

## 2014-01-01 MED ORDER — OXYCODONE-ACETAMINOPHEN 5-325 MG PO TABS: 1.0000 | ORAL_TABLET | ORAL | Status: DC | PRN

## 2014-01-01 NOTE — Telephone Encounter (Signed)
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  Discussed with patient.  Will plan to proceed with lap chole in near future.  The risks and benefits of the procedure have been discussed at length with the patient.  The patient understands the proposed procedure, potential alternative treatments, and the course of recovery to be expected.  All of the patient's questions have been answered at this time.  The patient wishes to proceed with surgery.  Earnstine Regal, MD, [facility] Office: [phone]

## 2014-01-01 NOTE — ED Notes (Signed)
Urine collected and at bedside.

## 2014-01-01 NOTE — ED Notes (Signed)
Pt c/o rt mid quad abd pain that began this am; pt states that she has been seen numerous times for this pain in the past and "thinks its my gallbladder"; pt c/o N/V; pt reports diarrhea this am; pt leaned over in chair holding onto abdomen

## 2014-01-01 NOTE — ED Provider Notes (Signed)
CSN: [id number]     Arrival date & time 01/01/14  0023 History   First MD Initiated Contact with Patient 01/01/14 0047     Chief Complaint  Patient presents with  . Abdominal Pain     (Consider location/radiation/quality/duration/timing/severity/associated sxs/prior Treatment) HPI Comments: Patient is a 28 year old female with a history of gastric reflux, anxiety, pyelonephritis, bipolar disorder, and chronic back pain who presents to the emergency department for abdominal pain. Patient states that she has been experiencing intermittent stabbing abdominal pain in her right mid and upper abdomen x4 months. She states that pain most recently began yesterday morning. Pain was intermittent in mildly improved before worsening again this evening. Patient states the pain is stabbing and nonradiating. She states that she has had associated nausea and 4 episodes of nonbloody emesis. She endorses watery, nonbloody stool yesterday morning which has resolved. Patient denies associated fever, hematemesis, melena, hematochezia, urinary symptoms, vaginal bleeding, vaginal discharge, and syncope. Patient denies a history of abdominal surgeries. She states that she had one vaginal delivery 4 months ago after which time her symptoms began. She has been followed by Dr. Harlow Asa of [facility] for this with plan for HIDA scan to further evaluate symptoms.  Patient is a 28 y.o. female presenting with abdominal pain. The history is provided by the patient. No language interpreter was used.  Abdominal Pain Associated symptoms: diarrhea, nausea and vomiting     Past Medical History  Diagnosis Date  . Gastric reflux   . Anxiety   . Basedow's disease   . Pyelonephritis   . History of nephrolithiasis   . Anxiety disorder   . Bipolar disorder   . Interstitial cystitis   . Chronic back pain   . Fibroid    Past Surgical History  Procedure Laterality Date  . Tonsillectomy    . Adenoidectomy    . Tear duct probing      Family History  Problem Relation Age of Onset  . Diabetes    . Hypertension    . Lung cancer    . Bipolar disorder Sister    History  Substance Use Topics  . Smoking status: Current Some Day Smoker -- 0.25 packs/day for 8 years    Types: Cigarettes  . Smokeless tobacco: Never Used  . Alcohol Use: No     Comment: Couple months since last use of alcohol states pt.  Pt reports that she would drink 1/2 of a fifth to take away emotional pain.    OB History   Grav Para Term Preterm Abortions TAB SAB Ect Mult Living   1 1 1       1       Review of Systems  Gastrointestinal: Positive for nausea, vomiting, abdominal pain and diarrhea.  All other systems reviewed and are negative.    Allergies  Review of patient's allergies indicates no known allergies.  Home Medications   Prior to Admission medications   Medication Sig Start Date End Date Taking? Authorizing Provider  levonorgestrel-ethinyl estradiol (AVIANE,ALESSE,LESSINA) 0.1-20 MG-MCG tablet Take 1 tablet by mouth daily.   Yes Historical Provider, MD  cephALEXin (KEFLEX) 500 MG capsule Take 1 capsule (500 mg total) by mouth 2 (two) times daily. 12/18/13   Rosalita Chessman, DO  ibuprofen (ADVIL,MOTRIN) 600 MG tablet Take 1 tablet (600 mg total) by mouth every 6 (six) hours as needed. 01/01/14   Antonietta Breach, PA-C  ondansetron (ZOFRAN-ODT) 4 MG disintegrating tablet Take 1 tablet (4 mg total) by mouth every 8 (eight)  hours as needed for nausea or vomiting. 01/01/14   Antonietta Breach, PA-C  oxyCODONE-acetaminophen (PERCOCET/ROXICET) 5-325 MG per tablet Take 1-2 tablets by mouth every 4 (four) hours as needed for severe pain. 01/01/14   Antonietta Breach, PA-C   BP 103/58  Pulse 80  Temp(Src) 98.6 F (37 C) (Oral)  Resp 18  Ht 5\' 5"  (1.651 m)  Wt 160 lb (72.576 kg)  BMI 26.63 kg/m2  SpO2 96%  LMP 12/19/2013  Physical Exam  Nursing note and vitals reviewed. Constitutional: She is oriented to person, place, and time. She appears  well-developed and well-nourished. No distress.  Nontoxic/nonseptic appearing. Patient appears anxious and uncomfortable, but is distractible.  HENT:  Head: Normocephalic and atraumatic.  Eyes: Conjunctivae and EOM are normal. No scleral icterus.  Neck: Normal range of motion.  Cardiovascular: Normal rate, regular rhythm and normal heart sounds.   Pulmonary/Chest: Effort normal and breath sounds normal. No respiratory distress. She has no wheezes. She has no rales.  Abdominal: Soft. Normal appearance. She exhibits no distension, no ascites, no pulsatile midline mass and no mass. There is tenderness in the right upper quadrant. There is no guarding, no CVA tenderness and negative Murphy's sign.    Focal TTP in RUQ with mild rebound tenderness. Referred tenderness to RUQ with palpation to rest of abdomen. Abdomen soft without peritoneal signs or involuntary guarding.  Musculoskeletal: Normal range of motion.  Neurological: She is alert and oriented to person, place, and time. She exhibits normal muscle tone. Coordination normal.  GCS 15. Patient moves extremities without ataxia  Skin: Skin is warm and dry. No rash noted. She is not diaphoretic. No erythema. No pallor.  Psychiatric: She has a normal mood and affect. Her behavior is normal.    ED Course  Procedures (including critical care time) Labs Review Labs Reviewed  COMPREHENSIVE METABOLIC PANEL - Abnormal; Notable for the following:    Glucose, Bld 101 (*)    Total Bilirubin <0.2 (*)    All other components within normal limits  URINALYSIS, ROUTINE W REFLEX MICROSCOPIC - Abnormal; Notable for the following:    APPearance CLOUDY (*)    All other components within normal limits  CBC WITH DIFFERENTIAL  LIPASE, BLOOD  PREGNANCY, URINE  I-STAT CG4 LACTIC ACID, ED    Imaging Review No results found.  Ct Abdomen Pelvis W Contrast  12/19/2013   CLINICAL DATA:  Right upper quadrant pain.  Nausea and vomiting.  EXAM: CT ABDOMEN AND  PELVIS WITH CONTRAST  TECHNIQUE: Multidetector CT imaging of the abdomen and pelvis was performed using the standard protocol following bolus administration of intravenous contrast.  CONTRAST:  126mL OMNIPAQUE IOHEXOL 300 MG/ML  SOLN  COMPARISON:  10/05/2013  FINDINGS: The liver, gallbladder, pancreas, spleen, adrenal glands, and kidneys are normal in appearance. No evidence of hydronephrosis. Uterus and adnexal regions are unremarkable in appearance.  No soft tissue masses or lymphadenopathy identified. No evidence of inflammatory process or abnormal fluid collections. Normal appendix is visualized. No evidence of bowel wall thickening or dilatation.  IMPRESSION: Negative. No acute findings or other significant abnormality identified.   Electronically Signed   By: Earle Gell M.D.   On: 12/19/2013 20:47   CLINICAL DATA: Right abdominal pain  ULTRASOUND ABDOMEN COMPLETE  COMPARISON: 10/05/2013  FINDINGS: Gallbladder: No gallstones or wall thickening visualized. No sonographic Murphy sign noted. Common bile duct: Diameter: 4 mm Liver: No focal lesion identified. Within normal limits in parenchymal echogenicity. IVC: No abnormality visualized. Pancreas: Visualized portion unremarkable.  Spleen: Size and appearance within normal limits. Right Kidney: Length: 11.1 cm. Echogenicity within normal limits. No mass or hydronephrosis visualized. Left Kidney: Length: 10.7 cm. Echogenicity within normal limits. No mass or hydronephrosis visualized. Abdominal aorta: No aneurysm visualized. Other findings: None. IMPRESSION: 1. Normal sonographic appearance the abdomen. Electronically Signed By: Sherryl Barters M.D. On: 11/28/2013 15:31    EKG Interpretation None      MDM   Final diagnoses:  Right upper quadrant pain    28 year old female presents to the emergency department for right upper quadrant abdominal pain. Patient has a history of same x4 months. Patient today is nontoxic and nonseptic appearing as well  as hemodynamically stable. Heart rate improved over ED course with fluids and pain control. Labs today are unremarkable; stable compared to prior. Prior imaging also reviewed which shows no apparent cause for recurrent right upper quadrant abdominal pain. Abdominal reexaminations has been stable over ED course without worsening or peritoneal signs. Given chronicity of symptoms, do not believe additional emergent imaging is indicated.  Pain well controlled with morphine and zofran; improved from 10/10 to 5/10. Patient scheduled for HIDA scan in AM. Believe symptoms able to be managed until HIDA scan today. Outpatient symptom management and return precautions discussed with patient who verbalizes comfort and understanding with this discharge plan with no unaddressed concerns.   Filed Vitals:   01/01/14 0037 01/01/14 0303 01/01/14 0501  BP: 103/77 103/58 104/61  Pulse: 122 80 72  Temp: 98.3 F (36.8 C) 98.6 F (37 C) 97.8 F (36.6 C)  TempSrc: Oral Oral Oral  Resp: 22 18 18   Height: 5\' 5"  (1.651 m)    Weight: 160 lb (72.576 kg)    SpO2: 99% 96% 94%       Antonietta Breach, PA-C 01/01/14 [phone]

## 2014-01-01 NOTE — ED Provider Notes (Signed)
Medical screening examination/treatment/procedure(s) were conducted as a shared visit with non-physician practitioner(s) and myself.  I personally evaluated the patient during the encounter.   EKG Interpretation None      The patient is feeling better at this time.  She is scheduled for a HIDA scan later this morning.  It is important that she follow up with this.  Pain improved.  Home with general surgery followup  Hoy Morn, MD 01/01/14 [phone]

## 2014-01-01 NOTE — Discharge Instructions (Signed)
Contact the office completing your HIDA scan at 8AM today. Notify them that you were in the ED for abdominal pain and received your last dose of narcotic pain medication at 3:45AM. You are not to have narcotic pain medication for 12 hrs before this test. Manage your pain with ibuprofen until your HIDA scan is complete. You may use Percocet for pain control only AFTER your HIDA scan. If your HIDA scan is postponed until tomorrow, you may take percocet until 12 hours before the rescheduled test. Follow up with Dr. Harlow Asa and your primary care doctor regarding your symptoms. Take Zofran as needed for nausea.  Abdominal Pain Many things can cause abdominal pain. Usually, abdominal pain is not caused by a disease and will improve without treatment. It can often be observed and treated at home. Your health care provider will do a physical exam and possibly order blood tests and X-rays to help determine the seriousness of your pain. However, in many cases, more time must pass before a clear cause of the pain can be found. Before that point, your health care provider may not know if you need more testing or further treatment. HOME CARE INSTRUCTIONS  Monitor your abdominal pain for any changes. The following actions may help to alleviate any discomfort you are experiencing:  Only take over-the-counter or prescription medicines as directed by your health care provider.  Do not take laxatives unless directed to do so by your health care provider.  Try a clear liquid diet (broth, tea, or water) as directed by your health care provider. Slowly move to a bland diet as tolerated. SEEK MEDICAL CARE IF:  You have unexplained abdominal pain.  You have abdominal pain associated with nausea or diarrhea.  You have pain when you urinate or have a bowel movement.  You experience abdominal pain that wakes you in the night.  You have abdominal pain that is worsened or improved by eating food.  You have abdominal pain  that is worsened with eating fatty foods.  You have a fever. SEEK IMMEDIATE MEDICAL CARE IF:   Your pain does not go away within 2 hours.  You keep throwing up (vomiting).  Your pain is felt only in portions of the abdomen, such as the right side or the left lower portion of the abdomen.  You pass bloody or black tarry stools. MAKE SURE YOU:  Understand these instructions.   Will watch your condition.   Will get help right away if you are not doing well or get worse.  Document Released: 03/10/2005 Document Revised: 06/05/2013 Document Reviewed: 02/07/2013 [facility] Patient Information 2015 [place], [place]. This information is not intended to replace advice given to you by your health care provider. Make sure you discuss any questions you have with your health care provider.  HIDA (Hepatobiliary) Scan Your caregiver has suggested that you have a HIDA Scan. This is also known as a hepatobiliary scan. The HIDA Scan helps evaluate the hepatobiliary system (liver and gallbladder and their ducts). Your liver is the organ in your body that produces bile. The bile is then collected in the gallbladder. The bile is stored and concentrated in the gallbladder. The bile is excreted (passed) into the small intestine when it is needed for digestion. A stone can block the duct (tube) leading from the gallbladder to the small intestine. This can cause an inflammation of the gallbladder (cholecystitis). Because bile is always needed for fat processing, you may feel a gallbladder attack especially after eating a fatty  meal. LET YOUR CAREGIVER KNOW ABOUT:  Allergies.  Medications taken including herbs, eye drops, over the counter medications, and creams.  Use of steroids (by mouth or creams).  Previous problems with anesthetics or novocaine.  Possibility of pregnancy, if this applies.  History of blood clots (thrombophlebitis).  History of bleeding or blood problems.  Previous surgery.  Other  health problems. BEFORE THE PROCEDURE  Do not eat or drink anything after midnight the night before the exam as instructed.  You may take medications with a small amount of water the morning of the exam unless your caregiver instructs you otherwise. You should be present 60 minutes prior to your procedure or as directed.  PROCEDURE   An IV will be placed in your arm and remain throughout the exam.  A small amount of very short acting radioactive material will be injected into the IV.  While lying down a special camera will be placed over your abdomen (belly). This camera is used to detect the injected material. The camera will place images on film. A radiologist (specialist in reading x-rays) can evaluate the images. It will help determine how well your gallbladder is working.  You will then be given a material called CCK. This will make your gallbladder contract. It occasionally causes symptoms (problems) that mimic a gallbladder attack or the feeling you have after eating a fatty meal.  The entire test usually takes one to two hours. Your caregiver can give you more accurate times. Following the test you may go home and resume normal activities and diet as instructed. Ask your caregiver how you are to find out your results. Remember, it is your responsibility to find out the results of your test. Do not assume everything is all right or "normal" if you have not heard from your caregiver. Document Released: 05/28/2000 Document Revised: 08/23/2011 Document Reviewed: 05/31/2005 [facility] Patient Information 2015 [place], [place]. This information is not intended to replace advice given to you by your health care provider. Make sure you discuss any questions you have with your health care provider.

## 2014-01-01 NOTE — Telephone Encounter (Signed)
Surgery scheduling form to coders to complete and send to surgery schedulers.

## 2014-01-02 ENCOUNTER — Telehealth (INDEPENDENT_AMBULATORY_CARE_PROVIDER_SITE_OTHER): Payer: Self-pay | Admitting: Surgery

## 2014-01-02 ENCOUNTER — Encounter (HOSPITAL_COMMUNITY): Payer: Self-pay | Admitting: Emergency Medicine

## 2014-01-02 ENCOUNTER — Telehealth (INDEPENDENT_AMBULATORY_CARE_PROVIDER_SITE_OTHER): Payer: Self-pay

## 2014-01-02 ENCOUNTER — Telehealth: Payer: Self-pay | Admitting: Family Medicine

## 2014-01-02 DIAGNOSIS — Z9089 Acquired absence of other organs: Secondary | ICD-10-CM | POA: Insufficient documentation

## 2014-01-02 DIAGNOSIS — Z87442 Personal history of urinary calculi: Secondary | ICD-10-CM | POA: Insufficient documentation

## 2014-01-02 DIAGNOSIS — K8020 Calculus of gallbladder without cholecystitis without obstruction: Secondary | ICD-10-CM | POA: Insufficient documentation

## 2014-01-02 DIAGNOSIS — F17200 Nicotine dependence, unspecified, uncomplicated: Secondary | ICD-10-CM | POA: Insufficient documentation

## 2014-01-02 DIAGNOSIS — Z8659 Personal history of other mental and behavioral disorders: Secondary | ICD-10-CM | POA: Insufficient documentation

## 2014-01-02 DIAGNOSIS — Z79899 Other long term (current) drug therapy: Secondary | ICD-10-CM | POA: Insufficient documentation

## 2014-01-02 DIAGNOSIS — Z862 Personal history of diseases of the blood and blood-forming organs and certain disorders involving the immune mechanism: Secondary | ICD-10-CM | POA: Insufficient documentation

## 2014-01-02 DIAGNOSIS — G8929 Other chronic pain: Secondary | ICD-10-CM | POA: Insufficient documentation

## 2014-01-02 DIAGNOSIS — Z8639 Personal history of other endocrine, nutritional and metabolic disease: Secondary | ICD-10-CM | POA: Insufficient documentation

## 2014-01-02 DIAGNOSIS — Z8719 Personal history of other diseases of the digestive system: Secondary | ICD-10-CM | POA: Insufficient documentation

## 2014-01-02 DIAGNOSIS — Z8742 Personal history of other diseases of the female genital tract: Secondary | ICD-10-CM | POA: Insufficient documentation

## 2014-01-02 MED ORDER — FENTANYL CITRATE 0.05 MG/ML IJ SOLN
50.0000 ug | Freq: Once | INTRAMUSCULAR | Status: AC
  Administered 2014-01-02: 50 ug via NASAL

## 2014-01-02 MED ORDER — FENTANYL CITRATE 0.05 MG/ML IJ SOLN
INTRAMUSCULAR | Status: DC
Start: 2014-01-02 — End: 2014-01-03
  Filled 2014-01-02: qty 2

## 2014-01-02 NOTE — Telephone Encounter (Signed)
Pt called with increasing abdominal pain and known gallstones.  Scheduled for elective lap chole for Dr Harlow Asa. Told to go to ED for evaluation.

## 2014-01-02 NOTE — ED Notes (Addendum)
Pt. reports persistent right upper abdominal pain with emesis , seen at [facility] ER yesterday blood tests / urine tests done prescribed with Zofran ODT , Percocet and Ibuprofen with no relief.

## 2014-01-02 NOTE — Telephone Encounter (Signed)
Pt spoke with Dr Harlow Asa yesterday regarding her gallbladder, he informed her that she will need sx. Pt is calling today stating that she has had to take pain meds due to her increased pain. Pt wanted to know if Dr Harlow Asa would write her out of work. Spoke with Dr Harlow Asa, he states that pt needs to see her PCP before sx he will not write her out, however he will write her out for 2 weeks after sx. Informed pt of this. Pt verbalized understanding.

## 2014-01-02 NOTE — Telephone Encounter (Signed)
Caller name: Myia  Call back number: [phone]   Reason for call:  Pt was seen on 7/2 for Gallbladder issues.  Pt states that her surgeon (has not had surgery yet) Dr. Royal Hawthorn, states she needs to contact her PCP to get a letter to be out of work.  Pt states that she has to take her pain medications, and if she take them she can't work.  That is why she needs to note to be out of work today.  Advised pt that this is prob not likely since she has not been seen since the 2nd.  Tried to get pt routed to [facility] for the pain she is having, but she refused, and stated that she went to ED the other day and does not want to go to a doctor again.  Please advise on all of this.

## 2014-01-02 NOTE — Telephone Encounter (Signed)
Patient states that she does not have a date for the surgery. She is waiting on a call from them to give her the date. "possibly next week but not sure". Advised patient to let us know when she has the date. States that she will call back when she is notified.

## 2014-01-02 NOTE — Telephone Encounter (Signed)
Pt called back. °

## 2014-01-03 ENCOUNTER — Emergency Department (HOSPITAL_COMMUNITY)
Admission: EM | Admit: 2014-01-03 | Discharge: 2014-01-03 | Disposition: A | Discharge status: Home / Self Care | Payer: 59 | Attending: Emergency Medicine | Admitting: Emergency Medicine

## 2014-01-03 DIAGNOSIS — K8050 Calculus of bile duct without cholangitis or cholecystitis without obstruction: Secondary | ICD-10-CM

## 2014-01-03 LAB — CBC WITH DIFFERENTIAL/PLATELET
BASOS ABS: 0 10*3/uL (ref 0.0–0.1)
Basophils Relative: 0 % (ref 0–1)
EOS ABS: 0.7 10*3/uL (ref 0.0–0.7)
Eosinophils Relative: 7 % — ABNORMAL HIGH (ref 0–5)
HCT: 36.7 % (ref 36.0–46.0)
Hemoglobin: 11.7 g/dL — ABNORMAL LOW (ref 12.0–15.0)
Lymphocytes Relative: 40 % (ref 12–46)
Lymphs Abs: 4.1 10*3/uL — ABNORMAL HIGH (ref 0.7–4.0)
MCH: 27.3 pg (ref 26.0–34.0)
MCHC: 31.9 g/dL (ref 30.0–36.0)
MCV: 85.7 fL (ref 78.0–100.0)
Monocytes Absolute: 0.6 10*3/uL (ref 0.1–1.0)
Monocytes Relative: 6 % (ref 3–12)
NEUTROS PCT: 47 % (ref 43–77)
Neutro Abs: 4.7 10*3/uL (ref 1.7–7.7)
PLATELETS: 206 10*3/uL (ref 150–400)
RBC: 4.28 MIL/uL (ref 3.87–5.11)
RDW: 13.5 % (ref 11.5–15.5)
WBC: 10.1 10*3/uL (ref 4.0–10.5)

## 2014-01-03 LAB — COMPREHENSIVE METABOLIC PANEL
ALBUMIN: 3.2 g/dL — AB (ref 3.5–5.2)
ALT: 16 U/L (ref 0–35)
AST: 13 U/L (ref 0–37)
Alkaline Phosphatase: 45 U/L (ref 39–117)
Anion gap: 11 (ref 5–15)
BUN: 6 mg/dL (ref 6–23)
CO2: 26 mEq/L (ref 19–32)
Calcium: 8.7 mg/dL (ref 8.4–10.5)
Chloride: 103 mEq/L (ref 96–112)
Creatinine, Ser: 0.72 mg/dL (ref 0.50–1.10)
GFR calc Af Amer: >90 mL/min (ref >90)
GFR calc non Af Amer: >90 mL/min (ref >90)
Glucose, Bld: 114 mg/dL — ABNORMAL HIGH (ref 70–99)
POTASSIUM: 4.3 meq/L (ref 3.7–5.3)
SODIUM: 140 meq/L (ref 137–147)
TOTAL PROTEIN: 6.1 g/dL (ref 6.0–8.3)
Total Bilirubin: <0.2 mg/dL — ABNORMAL LOW (ref 0.3–1.2)

## 2014-01-03 LAB — LIPASE, BLOOD: Lipase: 31 U/L (ref 11–59)

## 2014-01-03 MED ORDER — SODIUM CHLORIDE 0.9 % IV BOLUS (SEPSIS)
1000.0000 mL | Freq: Once | INTRAVENOUS | Status: AC
  Administered 2014-01-03: 1000 mL via INTRAVENOUS

## 2014-01-03 MED ORDER — ONDANSETRON HCL 4 MG/2ML IJ SOLN
4.0000 mg | Freq: Once | INTRAMUSCULAR | Status: AC
  Administered 2014-01-03: 4 mg via INTRAVENOUS
  Filled 2014-01-03: qty 2

## 2014-01-03 MED ORDER — PROMETHAZINE HCL 25 MG/ML IJ SOLN
12.5000 mg | Freq: Once | INTRAMUSCULAR | Status: AC
  Administered 2014-01-03: 12.5 mg via INTRAVENOUS
  Filled 2014-01-03: qty 1

## 2014-01-03 MED ORDER — FENTANYL CITRATE 0.05 MG/ML IJ SOLN
50.0000 ug | Freq: Once | INTRAMUSCULAR | Status: AC
  Administered 2014-01-03: 50 ug via INTRAVENOUS
  Filled 2014-01-03: qty 2

## 2014-01-03 MED ORDER — KETOROLAC TROMETHAMINE 30 MG/ML IJ SOLN
30.0000 mg | Freq: Once | INTRAMUSCULAR | Status: AC
  Administered 2014-01-03: 30 mg via INTRAVENOUS
  Filled 2014-01-03: qty 1

## 2014-01-03 NOTE — ED Provider Notes (Signed)
CSN: [id number]     Arrival date & time 01/02/14  2110 History   First MD Initiated Contact with Patient 01/03/14 0221     Chief Complaint  Patient presents with  . Abdominal Pain     (Consider location/radiation/quality/duration/timing/severity/associated sxs/prior Treatment) HPI  Patient is a 28 yo woman with recently diagnosed gallbladder disease who is in line for an elective cholecystectomy. She presents with right upper quadrant abdominal pain which began after she ate dinner last night around 1900. Pain has been persistent and severe. Worse when laying in the right lateral decubitus position.   The patient is afebrile in the ED. She has been nauseated but, denies vomiting. Her pain is nonradiating. She has received Fentanyl 50 mcg intranasally and this has helped to relieve her pain. She says she wants to go ahead and get this surgery over with. She called [facility] on call and spoke to Dr. Brantley Stage who advised her to come to the ED.   Past Medical History  Diagnosis Date  . Gastric reflux   . Anxiety   . Basedow's disease   . Pyelonephritis   . History of nephrolithiasis   . Anxiety disorder   . Bipolar disorder   . Interstitial cystitis   . Chronic back pain   . Fibroid    Past Surgical History  Procedure Laterality Date  . Tonsillectomy    . Adenoidectomy    . Tear duct probing     Family History  Problem Relation Age of Onset  . Diabetes    . Hypertension    . Lung cancer    . Bipolar disorder Sister    History  Substance Use Topics  . Smoking status: Current Some Day Smoker -- 0.25 packs/day for 8 years    Types: Cigarettes  . Smokeless tobacco: Never Used  . Alcohol Use: No     Comment: Couple months since last use of alcohol states pt.  Pt reports that she would drink 1/2 of a fifth to take away emotional pain.    OB History   Grav Para Term Preterm Abortions TAB SAB Ect Mult Living   1 1 1       1      Review of Systems  Ten point review of symptoms  performed and is negative with the exception of symptoms noted above.   Allergies  Review of patient's allergies indicates no known allergies.  Home Medications   Prior to Admission medications   Medication Sig Start Date End Date Taking? Authorizing Provider  ibuprofen (ADVIL,MOTRIN) 600 MG tablet Take 1 tablet (600 mg total) by mouth every 6 (six) hours as needed. 01/01/14  Yes Antonietta Breach, PA-C  levonorgestrel-ethinyl estradiol (AVIANE,ALESSE,LESSINA) 0.1-20 MG-MCG tablet Take 1 tablet by mouth daily.   Yes Historical Provider, MD  ondansetron (ZOFRAN-ODT) 4 MG disintegrating tablet Take 1 tablet (4 mg total) by mouth every 8 (eight) hours as needed for nausea or vomiting. 01/01/14  Yes Antonietta Breach, PA-C  oxyCODONE-acetaminophen (PERCOCET/ROXICET) 5-325 MG per tablet Take 1-2 tablets by mouth every 4 (four) hours as needed for severe pain. 01/01/14  Yes Antonietta Breach, PA-C   BP 99/62  Pulse 82  Temp(Src) 98 F (36.7 C) (Oral)  Resp 20  Ht 5\' 6"  (1.676 m)  Wt 169 lb (76.658 kg)  BMI 27.29 kg/m2  SpO2 98%  LMP 12/19/2013 Physical Exam  Gen: well developed and well nourished appearing, patient sleeping upon my arrival to her room, she was arousable and conversant. Head:  NCAT Eyes: PERL, EOMI Nose: no epistaixis or rhinorrhea Mouth/throat: mucosa is moist and pink Neck: supple, no stridor Lungs: CTA B, no wheezing, rhonchi or rales CV: RRR, no murmur, extremities appear well perfused.  Abd: soft, there is some tenderness to palpation of the right upper quadrant, no peritoneal signs appreciated, nondistended Back: no ttp, no cva ttp Skin: warm and dry Ext: normal to inspection, no dependent edema Neuro: CN ii-xii grossly intact, no focal deficits Psyche; normal affect,  calm and cooperative.   ED Course  Procedures (including critical care time) Labs Review  Imaging Review Nm Hepato W/eject Fract  01/01/2014   CLINICAL DATA:  28 year old female with right upper quadrant pain.  Initial encounter.  EXAM: NUCLEAR MEDICINE HEPATOBILIARY IMAGING WITH GALLBLADDER EF  TECHNIQUE: Sequential images of the abdomen were obtained out to 60 minutes following intravenous administration of radiopharmaceutical. After slow intravenous infusion of what 0.5 micrograms Cholecystokinin, gallbladder ejection fraction was determined.  RADIOPHARMACEUTICALS:  5.0 [place] [id number] Choletec  COMPARISON:  [facility] CT Abdomen and Pelvis 12/19/2013. Abdomen ultrasound 11/28/2013.  FINDINGS: Prompt radiotracer uptake by the liver and clearance of the blood pool. Prompt appearance of the central biliary tree and gallbladder by 20 min. Small bowel activity by 55 min.  Gallbladder ejection fraction calculated to be 25%. At 30 min, normal ejection fraction is greater than 30%.  The patient did experience pain symptoms during CCK infusion.  IMPRESSION: 1. Abnormally low gallbladder EF of 25% (normal > 30%). Patient reported pain symptoms with CCK injection. 2. Otherwise negative hepato biliary scan.   Electronically Signed   By: Lars Pinks M.D.   On: 01/01/2014 11:42     MDM   ddx includes biliary colic, cholecystitis, pancreatitis. We are managing symptomatically as we await labs.   ED work up is reassuring with normal LFTs, CBC, lipase. Patient is able to tolerate po intake. Her pain is improved. I discussed her case with Dr. Barry Dienes who concurs that the patient is safe for discharge with plan for outpatient cholecystectomy to be scheduled promptly. Return precautions discussed with the patient.    Elyn Peers, MD 01/03/14 [phone]

## 2014-01-03 NOTE — Discharge Instructions (Signed)
Biliary Colic  °Biliary colic is a steady or irregular pain in the upper abdomen. It is usually under the right side of the rib cage. It happens when gallstones interfere with the normal flow of bile from the gallbladder. Bile is a liquid that helps to digest fats. Bile is made in the liver and stored in the gallbladder. When you eat a meal, bile passes from the gallbladder through the cystic duct and the common bile duct into the small intestine. There, it mixes with partially digested food. If a gallstone blocks either of these ducts, the normal flow of bile is blocked. The muscle cells in the bile duct contract forcefully to try to move the stone. This causes the pain of biliary colic.  °SYMPTOMS  °· A person with biliary colic usually complains of pain in the upper abdomen. This pain can be: °¨ In the center of the upper abdomen just below the breastbone. °¨ In the upper-right part of the abdomen, near the gallbladder and liver. °¨ Spread back toward the right shoulder blade. °· Nausea and vomiting. °· The pain usually occurs after eating. °· Biliary colic is usually triggered by the digestive system's demand for bile. The demand for bile is high after fatty meals. Symptoms can also occur when a person who has been fasting suddenly eats a very large meal. Most episodes of biliary colic pass after 1 to 5 hours. After the most intense pain passes, your abdomen may continue to ache mildly for about 24 hours. °DIAGNOSIS  °After you describe your symptoms, your caregiver will perform a physical exam. He or she will pay attention to the upper right portion of your belly (abdomen). This is the area of your liver and gallbladder. An ultrasound will help your caregiver look for gallstones. Specialized scans of the gallbladder may also be done. Blood tests may be done, especially if you have fever or if your pain persists. °PREVENTION  °Biliary colic can be prevented by controlling the risk factors for gallstones. Some of  these risk factors, such as heredity, increasing age, and pregnancy are a normal part of life. Obesity and a high-fat diet are risk factors you can change through a healthy lifestyle. Women going through menopause who take hormone replacement therapy (estrogen) are also more likely to develop biliary colic. °TREATMENT  °· Pain medication may be prescribed. °· You may be encouraged to eat a fat-free diet. °· If the first episode of biliary colic is severe, or episodes of colic keep retuning, surgery to remove the gallbladder (cholecystectomy) is usually recommended. This procedure can be done through small incisions using an instrument called a laparoscope. The procedure often requires a brief stay in the hospital. Some people can leave the hospital the same day. It is the most widely used treatment in people troubled by painful gallstones. It is effective and safe, with no complications in more than 90% of cases. °· If surgery cannot be done, medication that dissolves gallstones may be used. This medication is expensive and can take months or years to work. Only small stones will dissolve. °· Rarely, medication to dissolve gallstones is combined with a procedure called shock-wave lithotripsy. This procedure uses carefully aimed shock waves to break up gallstones. In many people treated with this procedure, gallstones form again within a few years. °PROGNOSIS  °If gallstones block your cystic duct or common bile duct, you are at risk for repeated episodes of biliary colic. There is also a 25% chance that you will develop   a gallbladder infection(acute cholecystitis), or some other complication of gallstones within 10 to 20 years. If you have surgery, schedule it at a time that is convenient for you and at a time when you are not sick. °HOME CARE INSTRUCTIONS  °· Drink plenty of clear fluids. °· Avoid fatty, greasy or fried foods, or any foods that make your pain worse. °· Take medications as directed. °SEEK MEDICAL  CARE IF:  °· You develop a fever over 100.5° F (38.1° C). °· Your pain gets worse over time. °· You develop nausea that prevents you from eating and drinking. °· You develop vomiting. °SEEK IMMEDIATE MEDICAL CARE IF:  °· You have continuous or severe belly (abdominal) pain which is not relieved with medications. °· You develop nausea and vomiting which is not relieved with medications. °· You have symptoms of biliary colic and you suddenly develop a fever and shaking chills. This may signal cholecystitis. Call your caregiver immediately. °· You develop a yellow color to your skin or the white part of your eyes (jaundice). °Document Released: 11/01/2005 Document Revised: 08/23/2011 Document Reviewed: 01/11/2008 °[employer]® Patient Information ©2015 [employer]. This information is not intended to replace advice given to you by your health care provider. Make sure you discuss any questions you have with your health care provider. ° °

## 2014-01-03 NOTE — ED Notes (Signed)
Dr. Cheri Guppy aware of patients low blood pressure readings. See new orders for fluid bolus.

## 2014-01-03 NOTE — ED Notes (Signed)
Pt reports seen at [facility] on Monday for gallbladder issues and had scan done on Tuesday. She was told she would need to schedule for surgery. Tonight she is experiencing increase in pain to RUQ with nvd. Last ate/drank at 7pm. Surgeon informed her to come to [place].

## 2014-01-04 ENCOUNTER — Telehealth (INDEPENDENT_AMBULATORY_CARE_PROVIDER_SITE_OTHER): Payer: Self-pay

## 2014-01-04 NOTE — Telephone Encounter (Signed)
Pt is scheduled for a lap chole on 01/10/14 with Dr Harlow Asa. Pt is calling today stating that she has been having n/v and tenderness. Pt states that she has been taking Zofran, Tylenol and Percocet with minimal relief. Pt states that she has been to the ER several times and all they do for her is give her pain meds and send her home. Informed pt that I would send Dr Harlow Asa a message for any further recommendations. Informed pt that I would contact her as soon as we receive a response. Pt verbalized understanding.

## 2014-01-04 NOTE — Telephone Encounter (Signed)
Monica Nodal, RN at 01/04/2014 8:42 AM     Status: Signed        Pt is scheduled for a lap chole on 01/10/14 with Dr Harlow Asa. Pt is calling today stating that she has been having n/v and tenderness. Pt states that she has been taking Zofran, Tylenol and Percocet with minimal relief. Pt states that she has been to the ER several times and all they do for her is give her pain meds and send her home. Informed pt that I would send Dr Harlow Asa a message for any further recommendations. Informed pt that I would contact her as soon as we receive a response. Pt verbalized understanding.     Patient has been evaluated by ER and no acute surgical findings noted.  On my OR schedule for 01/10/2014.  If change in clinical status, patient should come to ER for assessment.  Otherwise will plan to proceed as scheduled on July 30th.  Monica Regal, MD, [facility] Office: [phone]

## 2014-01-04 NOTE — Telephone Encounter (Signed)
Informed pt of Dr Gala Lewandowsky message. Advised her that if anything changes that she needs to go to the ER for assessment. Pt verbalized understanding.

## 2014-01-05 ENCOUNTER — Observation Stay (HOSPITAL_COMMUNITY)
Admission: EM | Admit: 2014-01-05 | Discharge: 2014-01-06 | Disposition: A | Discharge status: Home / Self Care | Payer: 59 | Attending: General Surgery | Admitting: General Surgery

## 2014-01-05 ENCOUNTER — Encounter (HOSPITAL_COMMUNITY): Payer: Self-pay | Admitting: Emergency Medicine

## 2014-01-05 ENCOUNTER — Encounter (HOSPITAL_COMMUNITY)
Admission: EM | Disposition: A | Discharge status: Home / Self Care | Payer: Self-pay | Source: Home / Self Care | Attending: Emergency Medicine

## 2014-01-05 ENCOUNTER — Observation Stay (HOSPITAL_COMMUNITY): Payer: 59

## 2014-01-05 ENCOUNTER — Encounter (HOSPITAL_COMMUNITY): Payer: 59 | Admitting: Anesthesiology

## 2014-01-05 ENCOUNTER — Emergency Department (HOSPITAL_COMMUNITY): Payer: 59 | Admitting: Anesthesiology

## 2014-01-05 DIAGNOSIS — F17200 Nicotine dependence, unspecified, uncomplicated: Secondary | ICD-10-CM | POA: Diagnosis not present

## 2014-01-05 DIAGNOSIS — R1013 Epigastric pain: Secondary | ICD-10-CM

## 2014-01-05 DIAGNOSIS — R1011 Right upper quadrant pain: Secondary | ICD-10-CM

## 2014-01-05 DIAGNOSIS — K811 Chronic cholecystitis: Secondary | ICD-10-CM | POA: Diagnosis not present

## 2014-01-05 DIAGNOSIS — R112 Nausea with vomiting, unspecified: Secondary | ICD-10-CM

## 2014-01-05 DIAGNOSIS — K819 Cholecystitis, unspecified: Secondary | ICD-10-CM | POA: Diagnosis present

## 2014-01-05 DIAGNOSIS — K8020 Calculus of gallbladder without cholecystitis without obstruction: Secondary | ICD-10-CM | POA: Diagnosis present

## 2014-01-05 DIAGNOSIS — R109 Unspecified abdominal pain: Secondary | ICD-10-CM | POA: Diagnosis present

## 2014-01-05 HISTORY — PX: CHOLECYSTECTOMY: SHX55

## 2014-01-05 LAB — COMPREHENSIVE METABOLIC PANEL
ALT: 13 U/L (ref 0–35)
AST: 14 U/L (ref 0–37)
Albumin: 3.6 g/dL (ref 3.5–5.2)
Alkaline Phosphatase: 51 U/L (ref 39–117)
Anion gap: 12 (ref 5–15)
BUN: 8 mg/dL (ref 6–23)
CO2: 25 mEq/L (ref 19–32)
Calcium: 9.4 mg/dL (ref 8.4–10.5)
Chloride: 103 mEq/L (ref 96–112)
Creatinine, Ser: 0.84 mg/dL (ref 0.50–1.10)
GFR calc Af Amer: >90 mL/min (ref >90)
GFR calc non Af Amer: >90 mL/min (ref >90)
Glucose, Bld: 104 mg/dL — ABNORMAL HIGH (ref 70–99)
Potassium: 4.6 mEq/L (ref 3.7–5.3)
Sodium: 140 mEq/L (ref 137–147)
Total Bilirubin: <0.2 mg/dL — ABNORMAL LOW (ref 0.3–1.2)
Total Protein: 7.2 g/dL (ref 6.0–8.3)

## 2014-01-05 LAB — LIPASE, BLOOD: Lipase: 23 U/L (ref 11–59)

## 2014-01-05 SURGERY — LAPAROSCOPIC CHOLECYSTECTOMY WITH INTRAOPERATIVE CHOLANGIOGRAM
Anesthesia: General | Site: Abdomen

## 2014-01-05 MED ORDER — SUFENTANIL CITRATE 50 MCG/ML IV SOLN
INTRAVENOUS | Status: DC | PRN
  Administered 2014-01-05 (×2): 5 ug via INTRAVENOUS
  Administered 2014-01-05: 15 ug via INTRAVENOUS
  Administered 2014-01-05: 10 ug via INTRAVENOUS

## 2014-01-05 MED ORDER — PROPOFOL 10 MG/ML IV BOLUS
INTRAVENOUS | Status: AC
  Filled 2014-01-05: qty 20

## 2014-01-05 MED ORDER — GLYCOPYRROLATE 0.2 MG/ML IJ SOLN
INTRAMUSCULAR | Status: AC
  Filled 2014-01-05: qty 2

## 2014-01-05 MED ORDER — MORPHINE SULFATE 2 MG/ML IJ SOLN
2.0000 mg | INTRAMUSCULAR | Status: DC | PRN
  Administered 2014-01-05: 4 mg via INTRAVENOUS
  Filled 2014-01-05: qty 2

## 2014-01-05 MED ORDER — ONDANSETRON HCL 4 MG/2ML IJ SOLN
INTRAMUSCULAR | Status: DC | PRN
  Administered 2014-01-05: 4 mg via INTRAVENOUS

## 2014-01-05 MED ORDER — CISATRACURIUM BESYLATE 20 MG/10ML IV SOLN
INTRAVENOUS | Status: AC
  Filled 2014-01-05: qty 10

## 2014-01-05 MED ORDER — CEFAZOLIN SODIUM-DEXTROSE 2-3 GM-% IV SOLR
2.0000 g | INTRAVENOUS | Status: AC
  Administered 2014-01-05: 2 g via INTRAVENOUS
  Filled 2014-01-05: qty 50

## 2014-01-05 MED ORDER — PROPOFOL 10 MG/ML IV BOLUS
INTRAVENOUS | Status: DC | PRN
  Administered 2014-01-05: 200 mg via INTRAVENOUS

## 2014-01-05 MED ORDER — HYDROMORPHONE HCL PF 1 MG/ML IJ SOLN
1.0000 mg | INTRAMUSCULAR | Status: DC | PRN
  Administered 2014-01-05 – 2014-01-06 (×4): 1 mg via INTRAVENOUS
  Filled 2014-01-05 (×4): qty 1

## 2014-01-05 MED ORDER — PROMETHAZINE HCL 25 MG/ML IJ SOLN: 6.2500 mg | INTRAMUSCULAR | Status: DC | PRN

## 2014-01-05 MED ORDER — IOHEXOL 300 MG/ML  SOLN
INTRAMUSCULAR | Status: DC | PRN
  Administered 2014-01-05: 8 mL

## 2014-01-05 MED ORDER — BUPIVACAINE-EPINEPHRINE (PF) 0.25% -1:200000 IJ SOLN
INTRAMUSCULAR | Status: AC
  Filled 2014-01-05: qty 30

## 2014-01-05 MED ORDER — CISATRACURIUM BESYLATE (PF) 10 MG/5ML IV SOLN
INTRAVENOUS | Status: DC | PRN
  Administered 2014-01-05: 4 mg via INTRAVENOUS
  Administered 2014-01-05: 2 mg via INTRAVENOUS

## 2014-01-05 MED ORDER — BUPIVACAINE-EPINEPHRINE 0.25% -1:200000 IJ SOLN
INTRAMUSCULAR | Status: DC | PRN
  Administered 2014-01-05: 15 mL

## 2014-01-05 MED ORDER — LIDOCAINE HCL (CARDIAC) 20 MG/ML IV SOLN
INTRAVENOUS | Status: AC
  Filled 2014-01-05: qty 5

## 2014-01-05 MED ORDER — GLYCOPYRROLATE 0.2 MG/ML IJ SOLN
INTRAMUSCULAR | Status: DC | PRN
  Administered 2014-01-05: .4 mg via INTRAVENOUS

## 2014-01-05 MED ORDER — HEPARIN SODIUM (PORCINE) 5000 UNIT/ML IJ SOLN
5000.0000 [IU] | Freq: Three times a day (TID) | INTRAMUSCULAR | Status: DC
  Administered 2014-01-05 – 2014-01-06 (×2): 5000 [IU] via SUBCUTANEOUS
  Filled 2014-01-05 (×6): qty 1

## 2014-01-05 MED ORDER — KETOROLAC TROMETHAMINE 30 MG/ML IJ SOLN
INTRAMUSCULAR | Status: AC
  Filled 2014-01-05: qty 1

## 2014-01-05 MED ORDER — HYDROMORPHONE HCL PF 1 MG/ML IJ SOLN
0.2500 mg | INTRAMUSCULAR | Status: DC | PRN
  Administered 2014-01-05 (×2): 0.5 mg via INTRAVENOUS

## 2014-01-05 MED ORDER — DEXAMETHASONE SODIUM PHOSPHATE 10 MG/ML IJ SOLN
INTRAMUSCULAR | Status: DC | PRN
  Administered 2014-01-05: 10 mg via INTRAVENOUS

## 2014-01-05 MED ORDER — LACTATED RINGERS IV SOLN: INTRAVENOUS | Status: DC

## 2014-01-05 MED ORDER — PROMETHAZINE HCL 25 MG/ML IJ SOLN
25.0000 mg | Freq: Once | INTRAMUSCULAR | Status: AC
  Administered 2014-01-05: 25 mg via INTRAMUSCULAR
  Filled 2014-01-05: qty 1

## 2014-01-05 MED ORDER — CEFAZOLIN (ANCEF) 1 G IV SOLR: 2.0000 g | INTRAVENOUS | Status: DC

## 2014-01-05 MED ORDER — NEOSTIGMINE METHYLSULFATE 10 MG/10ML IV SOLN
INTRAVENOUS | Status: DC | PRN
  Administered 2014-01-05: 3 mg via INTRAVENOUS

## 2014-01-05 MED ORDER — LEVONORGESTREL-ETHINYL ESTRAD 0.1-20 MG-MCG PO TABS: 1.0000 | ORAL_TABLET | Freq: Every day | ORAL | Status: DC

## 2014-01-05 MED ORDER — HYDROMORPHONE HCL PF 1 MG/ML IJ SOLN
1.0000 mg | Freq: Once | INTRAMUSCULAR | Status: AC
Start: 2014-01-05 — End: 2014-01-05
  Administered 2014-01-05: 1 mg via INTRAMUSCULAR
  Filled 2014-01-05: qty 1

## 2014-01-05 MED ORDER — ONDANSETRON HCL 4 MG/2ML IJ SOLN
INTRAMUSCULAR | Status: AC
  Filled 2014-01-05: qty 2

## 2014-01-05 MED ORDER — HYDROMORPHONE HCL PF 1 MG/ML IJ SOLN
INTRAMUSCULAR | Status: AC
  Filled 2014-01-05: qty 1

## 2014-01-05 MED ORDER — KETOROLAC TROMETHAMINE 30 MG/ML IJ SOLN
15.0000 mg | Freq: Once | INTRAMUSCULAR | Status: AC | PRN
  Administered 2014-01-05: 30 mg via INTRAVENOUS

## 2014-01-05 MED ORDER — CEFAZOLIN SODIUM-DEXTROSE 2-3 GM-% IV SOLR
INTRAVENOUS | Status: AC
  Filled 2014-01-05: qty 50

## 2014-01-05 MED ORDER — MIDAZOLAM HCL 2 MG/2ML IJ SOLN
INTRAMUSCULAR | Status: AC
  Filled 2014-01-05: qty 2

## 2014-01-05 MED ORDER — LIDOCAINE HCL (CARDIAC) 20 MG/ML IV SOLN
INTRAVENOUS | Status: DC | PRN
  Administered 2014-01-05: 75 mg via INTRAVENOUS

## 2014-01-05 MED ORDER — LACTATED RINGERS IR SOLN
Status: DC | PRN
  Administered 2014-01-05: 1

## 2014-01-05 MED ORDER — SODIUM CHLORIDE 0.9 % IJ SOLN
INTRAMUSCULAR | Status: AC
  Filled 2014-01-05: qty 10

## 2014-01-05 MED ORDER — LACTATED RINGERS IV SOLN
INTRAVENOUS | Status: DC | PRN
  Administered 2014-01-05 (×2): via INTRAVENOUS

## 2014-01-05 MED ORDER — 0.9 % SODIUM CHLORIDE (POUR BTL) OPTIME
TOPICAL | Status: DC | PRN
  Administered 2014-01-05: 1000 mL

## 2014-01-05 MED ORDER — NEOSTIGMINE METHYLSULFATE 10 MG/10ML IV SOLN
INTRAVENOUS | Status: AC
  Filled 2014-01-05: qty 1

## 2014-01-05 MED ORDER — OXYCODONE-ACETAMINOPHEN 5-325 MG PO TABS
1.0000 | ORAL_TABLET | ORAL | Status: DC | PRN
  Administered 2014-01-05 – 2014-01-06 (×3): 2 via ORAL
  Filled 2014-01-05 (×3): qty 2

## 2014-01-05 MED ORDER — MIDAZOLAM HCL 5 MG/5ML IJ SOLN
INTRAMUSCULAR | Status: DC | PRN
  Administered 2014-01-05: 2 mg via INTRAVENOUS

## 2014-01-05 MED ORDER — SUFENTANIL CITRATE 50 MCG/ML IV SOLN
INTRAVENOUS | Status: AC
  Filled 2014-01-05: qty 1

## 2014-01-05 MED ORDER — SUCCINYLCHOLINE CHLORIDE 20 MG/ML IJ SOLN
INTRAMUSCULAR | Status: DC | PRN
  Administered 2014-01-05: 100 mg via INTRAVENOUS

## 2014-01-05 SURGICAL SUPPLY — 39 items
ADH SKN CLS APL DERMABOND .7 (GAUZE/BANDAGES/DRESSINGS) ×1
APPLIER CLIP ROT 10 11.4 M/L (STAPLE) ×3
APR CLP MED LRG 11.4X10 (STAPLE) ×1
BAG SPEC RTRVL LRG 6X4 10 (ENDOMECHANICALS) ×1
CANISTER SUCTION 2500CC (MISCELLANEOUS) ×3 IMPLANT
CATH REDDICK CHOLANGI 4FR 50CM (CATHETERS) IMPLANT
CHLORAPREP W/TINT 26ML (MISCELLANEOUS) ×3 IMPLANT
CLIP APPLIE ROT 10 11.4 M/L (STAPLE) ×1 IMPLANT
COVER MAYO STAND STRL (DRAPES) ×3 IMPLANT
DECANTER SPIKE VIAL GLASS SM (MISCELLANEOUS) ×3 IMPLANT
DERMABOND ADVANCED (GAUZE/BANDAGES/DRESSINGS) ×2
DERMABOND ADVANCED .7 DNX12 (GAUZE/BANDAGES/DRESSINGS) ×1 IMPLANT
DRAPE C-ARM 42X120 X-RAY (DRAPES) ×3 IMPLANT
DRAPE LAPAROSCOPIC ABDOMINAL (DRAPES) ×3 IMPLANT
DRAPE UTILITY XL STRL (DRAPES) ×3 IMPLANT
ELECT REM PT RETURN 9FT ADLT (ELECTROSURGICAL) ×3
ELECTRODE REM PT RTRN 9FT ADLT (ELECTROSURGICAL) ×1 IMPLANT
GLOVE BIOGEL PI IND STRL 7.5 (GLOVE) ×1 IMPLANT
GLOVE BIOGEL PI INDICATOR 7.5 (GLOVE) ×2
GLOVE SS BIOGEL STRL SZ 7.5 (GLOVE) ×1 IMPLANT
GLOVE SUPERSENSE BIOGEL SZ 7.5 (GLOVE) ×2
GOWN STRL REUS W/TWL XL LVL3 (GOWN DISPOSABLE) ×6 IMPLANT
HEMOSTAT SNOW SURGICEL 2X4 (HEMOSTASIS) IMPLANT
KIT BASIN OR (CUSTOM PROCEDURE TRAY) ×3 IMPLANT
NS IRRIG 1000ML POUR BTL (IV SOLUTION) ×2 IMPLANT
POUCH SPECIMEN RETRIEVAL 10MM (ENDOMECHANICALS) ×2 IMPLANT
SCISSORS LAP 5X35 DISP (ENDOMECHANICALS) ×3 IMPLANT
SET CHOLANGIOGRAPH MIX (MISCELLANEOUS) ×3 IMPLANT
SET IRRIG TUBING LAPAROSCOPIC (IRRIGATION / IRRIGATOR) ×3 IMPLANT
SLEEVE XCEL OPT CAN 5 100 (ENDOMECHANICALS) ×3 IMPLANT
SOLUTION ANTI FOG 6CC (MISCELLANEOUS) ×3 IMPLANT
SUT MNCRL AB 4-0 PS2 18 (SUTURE) ×3 IMPLANT
TOWEL OR 17X26 10 PK STRL BLUE (TOWEL DISPOSABLE) ×3 IMPLANT
TOWEL OR NON WOVEN STRL DISP B (DISPOSABLE) ×3 IMPLANT
TRAY LAP CHOLE (CUSTOM PROCEDURE TRAY) ×3 IMPLANT
TROCAR BLADELESS OPT 5 100 (ENDOMECHANICALS) ×3 IMPLANT
TROCAR XCEL BLUNT TIP 100MML (ENDOMECHANICALS) ×3 IMPLANT
TROCAR XCEL NON-BLD 11X100MML (ENDOMECHANICALS) ×3 IMPLANT
TUBING INSUFFLATION 10FT LAP (TUBING) ×3 IMPLANT

## 2014-01-05 NOTE — Anesthesia Preprocedure Evaluation (Signed)
Anesthesia Evaluation  Patient identified by MRN, date of birth, ID band Patient awake    Reviewed: Allergy & Precautions, H&P , NPO status , Patient's Chart, lab work & pertinent test results  Airway Mallampati: II TM Distance: >3 FB Neck ROM: Full    Dental no notable dental hx.    Pulmonary Current Smoker,  breath sounds clear to auscultation  Pulmonary exam normal       Cardiovascular negative cardio ROS  Rhythm:Regular Rate:Normal     Neuro/Psych negative neurological ROS  negative psych ROS   GI/Hepatic negative GI ROS, Neg liver ROS,   Endo/Other  Hyperthyroidism   Renal/GU negative Renal ROS  negative genitourinary   Musculoskeletal negative musculoskeletal ROS (+)   Abdominal   Peds negative pediatric ROS (+)  Hematology negative hematology ROS (+)   Anesthesia Other Findings   Reproductive/Obstetrics negative OB ROS                           Anesthesia Physical Anesthesia Plan  ASA: II  Anesthesia Plan: General   Post-op Pain Management:    Induction: Intravenous  Airway Management Planned: Oral ETT  Additional Equipment:   Intra-op Plan:   Post-operative Plan: Extubation in OR  Informed Consent: I have reviewed the patients History and Physical, chart, labs and discussed the procedure including the risks, benefits and alternatives for the proposed anesthesia with the patient or authorized representative who has indicated his/her understanding and acceptance.   Dental advisory given  Plan Discussed with: CRNA and Surgeon  Anesthesia Plan Comments:         Anesthesia Quick Evaluation

## 2014-01-05 NOTE — Anesthesia Postprocedure Evaluation (Signed)
  Anesthesia Post-op Note  Patient: [name]  Procedure(s) Performed: Procedure(s) (LRB): LAPAROSCOPIC CHOLECYSTECTOMY WITH INTRAOPERATIVE CHOLANGIOGRAM (N/A)  Patient Location: PACU  Anesthesia Type: General  Level of Consciousness: awake and alert   Airway and Oxygen Therapy: Patient Spontanous Breathing  Post-op Pain: mild  Post-op Assessment: Post-op Vital signs reviewed, Patient's Cardiovascular Status Stable, Respiratory Function Stable, Patent Airway and No signs of Nausea or vomiting  Last Vitals:  Filed Vitals:   01/05/14 1948  BP: 103/69  Pulse: 69  Temp: 36.6 C  Resp: 18    Post-op Vital Signs: stable   Complications: No apparent anesthesia complications

## 2014-01-05 NOTE — H&P (Signed)
[name] is an 28 y.o. female.   Chief Complaint: abdominal pain  HPI: patient is a 28 year old female who presents with worsening epigastric and right upper quadrant pain, nausea and vomiting. She has had gradually worsening symptoms of postprandial right upper quadrant and epigastric pain and nausea since just before the birth of her child in March. The symptoms have progressively worsened. She has had a recent workup including normal CT scan of the abdomen and pelvis and normal gallbladder ultrasound. However the patient has continued to have worsening symptoms. Recent HIDA was performed showing a slightly low ejection fraction of 25%. The patient was seen in our office and after discussion regarding pros and cons of surgery in this situation was scheduled for elective cholecystectomy later this week. She since has presented to the emergency room on 2 occasions and again today with persistent epigastric and right upper quadrant pain which she says is not bearable associated with frequent nausea and inability to take any by mouth intake. She states she has felt chilled and febrile at times. Does not feel she can get by any more at home.  Past Medical History  Diagnosis Date  . Gastric reflux   . Anxiety   . Basedow's disease   . Pyelonephritis   . History of nephrolithiasis   . Anxiety disorder   . Bipolar disorder   . Interstitial cystitis   . Chronic back pain   . Fibroid     Past Surgical History  Procedure Laterality Date  . Tonsillectomy    . Adenoidectomy    . Tear duct probing      Family History  Problem Relation Age of Onset  . Diabetes    . Hypertension    . Lung cancer    . Bipolar disorder Sister    Social History:  reports that she has been smoking Cigarettes.  She has a 2 pack-year smoking history. She has never used smokeless tobacco. She reports that she does not drink alcohol or use illicit drugs.  Allergies: No Known Allergies  No current  facility-administered medications for this encounter.   Current Outpatient Prescriptions  Medication Sig Dispense Refill  . ibuprofen (ADVIL,MOTRIN) 600 MG tablet Take 600 mg by mouth every 6 (six) hours as needed for moderate pain.      Marland Kitchen levonorgestrel-ethinyl estradiol (AVIANE,ALESSE,LESSINA) 0.1-20 MG-MCG tablet Take 1 tablet by mouth daily.      . ondansetron (ZOFRAN-ODT) 4 MG disintegrating tablet Take 1 tablet (4 mg total) by mouth every 8 (eight) hours as needed for nausea or vomiting.  20 tablet  0  . oxyCODONE-acetaminophen (PERCOCET/ROXICET) 5-325 MG per tablet Take 1-2 tablets by mouth every 4 (four) hours as needed for severe pain.  17 tablet  0   Facility-Administered Medications Ordered in Other Encounters  Medication Dose Route Frequency Provider Last Rate Last Dose  . sincalide [facility]) injection 1.5 mcg  0.02 mcg/kg Intravenous Once Medication Radiologist, MD         Results for orders placed during the hospital encounter of 01/05/14 (from the past 48 hour(s))  COMPREHENSIVE METABOLIC PANEL     Status: Abnormal   Collection Time    01/05/14 10:38 AM      Result Value Ref Range   Sodium 140  137 - 147 mEq/L   Potassium 4.6  3.7 - 5.3 mEq/L   Chloride 103  96 - 112 mEq/L   CO2 25  19 - 32 mEq/L   Glucose, Bld 104 (*) 70 -  99 mg/dL   BUN 8  6 - 23 mg/dL   Creatinine, Ser 0.84  0.50 - 1.10 mg/dL   Calcium 9.4  8.4 - 10.5 mg/dL   Total Protein 7.2  6.0 - 8.3 g/dL   Albumin 3.6  3.5 - 5.2 g/dL   AST 14  0 - 37 U/L   ALT 13  0 - 35 U/L   Alkaline Phosphatase 51  39 - 117 U/L   Total Bilirubin <0.2 (*) 0.3 - 1.2 mg/dL   GFR calc non Af Amer >90  >90 mL/min   GFR calc Af Amer >90  >90 mL/min   Comment: (NOTE)     The eGFR has been calculated using the CKD EPI equation.     This calculation has not been validated in all clinical situations.     eGFR's persistently <90 mL/min signify possible Chronic Kidney     Disease.   Anion gap 12  5 - 15  LIPASE, BLOOD      Status: None   Collection Time    01/05/14 10:38 AM      Result Value Ref Range   Lipase 23  11 - 59 U/L   No results found.  Review of Systems  Constitutional: Positive for fever and chills.  Respiratory: Negative for shortness of breath and wheezing.   Gastrointestinal: Positive for nausea, vomiting and abdominal pain.  Genitourinary: Negative.   Neurological: Positive for weakness.    Blood pressure 123/90, pulse 71, temperature 97.7 F (36.5 C), temperature source Oral, resp. rate 15, last menstrual period 12/19/2013, SpO2 100.00%, not currently breastfeeding. Physical Exam  General: Alert, well-developed Caucasian female who appears very uncomfortable Skin: Warm and dry without rash or infection. HEENT: No palpable masses or thyromegaly. Sclera nonicteric. Pupils equal round and reactive. Lungs: Breath sounds clear and equal without increased work of breathing Cardiovascular: Regular rate and rhythm without murmur. No JVD or edema. Peripheral pulses intact. Abdomen: Nondistended. Tender in the epigastrium and right upper quadrant with minimal palpation. No masses palpable. No organomegaly. No palpable hernias. Extremities: No edema or joint swelling or deformity. No chronic venous stasis changes. Neurologic: Alert and fully oriented.   Assessment/Plan Persistent and worsening epigastric and right upper quadrant abdominal pain, nausea and vomiting since birth of her child in March. Extensive workup negative as above except for slightly reduced ejection fraction on HIDA. Symptoms are certainly suggestive of biliary tract disease. It has previously been discussed with the patient and her mother that the results of cholecystectomy in this situation are not predictable in terms of relieving her symptoms.  Decision had previously been made to proceed with cholecystectomy and the patient feels that her symptoms now are severe and intolerable. I discussed options with her including possible  upper endoscopy but she and her mother would prefer to proceed with surgery as the next step and I don't think that this is unreasonable. They understand that it is very possible her symptoms will not be relieved with cholecystectomy.I discussed the procedure in detail.  The patient has previously been given Neurosurgeon We discussed the risks and benefits of a laparoscopic cholecystectomy and possible cholangiogram including, but not limited to bleeding, infection, injury to surrounding structures such as the intestine or liver, bile leak, retained gallstones, need to convert to an open procedure, prolonged diarrhea, blood clots such as  DVT, common bile duct injury, anesthesia risks, and possible need for additional procedures.  The likelihood of improvement in symptoms is uncertain. We  discussed the typical post-operative recovery course. She and her mother understand and desire to proceed.  Reiley Keisler T 01/05/2014, 1:09 PM

## 2014-01-05 NOTE — Transfer of Care (Signed)
Immediate Anesthesia Transfer of Care Note  Patient: [name]  Procedure(s) Performed: Procedure(s): LAPAROSCOPIC CHOLECYSTECTOMY WITH INTRAOPERATIVE CHOLANGIOGRAM (N/A)  Patient Location: PACU  Anesthesia Type:General  Level of Consciousness: awake, alert  and oriented  Airway & Oxygen Therapy: Patient Spontanous Breathing and Patient connected to nasal cannula oxygen  Post-op Assessment: Report given to PACU RN and Post -op Vital signs reviewed and stable  Post vital signs: Reviewed and stable  Complications: No apparent anesthesia complications

## 2014-01-05 NOTE — Anesthesia Procedure Notes (Signed)
Procedure Name: Intubation Date/Time: 01/05/2014 2:50 PM Performed by: Danley Danker L Patient Re-evaluated:Patient Re-evaluated prior to inductionOxygen Delivery Method: Circle system utilized Preoxygenation: Pre-oxygenation with 100% oxygen Intubation Type: IV induction Laryngoscope Size: Miller and 3 Grade View: Grade I Tube type: Oral Tube size: 7.5 mm Number of attempts: 1 Airway Equipment and Method: Stylet Placement Confirmation: ETT inserted through vocal cords under direct vision,  breath sounds checked- equal and bilateral and positive ETCO2 Secured at: 21 cm Tube secured with: Tape Dental Injury: Teeth and Oropharynx as per pre-operative assessment

## 2014-01-05 NOTE — ED Notes (Signed)
Pt has been seen here multiple times for same complaint.  Is supposed to have her gallbladder out on Thursday but states that she has not been able to hold anything down.  When asked when this started, pt states that she has been feeling the same way for 5 months.  Pt's mother seems agitated with patient.  She states "I'm ready for you to get this surgery so you can quit disrupting everyone's lives".

## 2014-01-05 NOTE — Op Note (Signed)
Preoperative diagnosis: Cholelithiasis and cholecystitis  Postoperative diagnosis: Cholelithiasis and cholecystitis  Surgical procedure: Laparoscopic cholecystectomy with intraoperative cholangiogram  Surgeon: Marland Kitchen T. Altus Zaino M.D.  Assistant: None  Anesthesia: General Endotracheal  Complications: None  Estimated blood loss: Minimal  Description of procedure: The patient brought to the operating room, placed in the supine position on the operating table, and general endotracheal anesthesia induced. The abdomen was widely sterilely prepped and draped. The patient had received preoperative IV antibiotics and PAS were in place. Patient timeout was performed the correct procedure verified. Standard 4 port technique was used with an open Hassan cannula at the umbilicus and the remainder of the ports placed under direct vision. The gallbladder was visualized. It appeared grossly unremarkable.  The stomach, duodenum, colon and retroperitoneum appearared unremarkable. The fundus was grasped and elevated up over the liver and the infundibulum retracted inferiolaterally. Peritoneum anterior and posterior to close triangle was incised and fibrofatty tissue stripped off the neck of the gallbladder toward the porta hepatis. The distal gallbladder was thoroughly dissected. The cystic artery was identified in close triangle and the cystic duct gallbladder junction dissected 360.  A good critical view was obtained. When the anatomy was clear the cystic duct was clipped at the gallbladder junction and an operative cholangiogram obtained through the cystic duct. This showed good filling of a normal common bile duct and intrahepatic ducts with free flow into the duodenum and no filling defects. Following this the Cholangiocath was removed and the cystic duct was doubly clipped proximally and divided. The cystic artery was doubly clipped proximally and distally and divided. The gallbladder was dissected free from  its bed using hook cautery and removed through the umbilical port site. Complete hemostasis was obtained in the gallbladder bed. The right upper quadrant was thoroughly irrigated and hemostasis assured. Trochars were removed and all CO2 evacuated and the [facility] trocar site fascial defect closed. Skin incisions were closed with subcuticular Monocryl and Dermabond. Sponge needle and instrument counts were correct. The patient was taken to PACU in good condition.  Selina Tapper T  01/05/2014

## 2014-01-06 MED ORDER — OXYCODONE-ACETAMINOPHEN 5-325 MG PO TABS: 1.0000 | ORAL_TABLET | ORAL | Status: DC | PRN

## 2014-01-06 MED ORDER — ONDANSETRON 4 MG PO TBDP: 4.0000 mg | ORAL_TABLET | Freq: Three times a day (TID) | ORAL | Status: DC | PRN

## 2014-01-06 NOTE — Discharge Summary (Signed)
Physician Discharge Summary [facility]  Patient ID: LEANA SPRINGSTON MRN: [id number] DOB/AGE: 08-18-1985 28 y.o.  Admit date: 01/05/2014 Discharge date: 01/06/2014  Admission Diagnoses:  Abdominal pain, biliary dyskinesia  Discharge Diagnoses:  Active Problems:   Gallstones   Cholecystitis   Discharged Condition: good  Hospital Course: patient admitted for observation after lap chole.  Post op course uncomplicated.  Pain controlled. Tolerating regular diet.  Discharge home on POD#1.  Consults: None  Treatments: surgery: lap chole with IOC  Discharge Exam: Blood pressure 93/58, pulse 82, temperature 97.8 F (36.6 C), temperature source Oral, resp. rate 18, last menstrual period 12/19/2013, SpO2 98.00%, not currently breastfeeding. HEENT - clear Neck - soft Chest - clear bilaterally Cor - RRR Abd - soft without distension; incisions clear and dry and intact  Disposition: Home  Discharge Instructions   Diet - low sodium heart healthy    Complete by:  As directed      Discharge instructions    Complete by:  As directed   [facility]  LAPAROSCOPIC SURGERY - POST-OP INSTRUCTIONS  Always review your discharge instruction sheet given to you by the facility where your surgery was performed.  A prescription for pain medication may be given to you upon discharge.  Take your pain medication as prescribed.  If narcotic pain medicine is not needed, then you may take acetaminophen (Tylenol) or ibuprofen (Advil) as needed.  Take your usually prescribed medications unless otherwise directed.  If you need a refill on your pain medication, please contact your pharmacy.  They will contact our office to request authorization. Prescriptions will not be filled after 5 P.M. or on weekends.  You should follow a light diet the first few days after arrival home, such as soup and crackers or toast.  Be sure to include plenty of fluids daily.  Most  patients will experience some swelling and bruising in the area of the incisions.  Ice packs will help.  Swelling and bruising can take several days to resolve.   It is common to experience some constipation if taking pain medication after surgery.  Increasing fluid intake and taking a stool softener (such as Colace) will usually help or prevent this problem from occurring.  A mild laxative (Milk of Magnesia or Miralax) should be taken according to package instructions if there are no bowel movements after 48 hours.  Unless discharge instructions indicate otherwise, you may remove your bandages 24-48 hours after surgery, and you may shower at that time.  You may have steri-strips (small skin tapes) in place directly over the incision.  These strips should be left on the skin for 7-10 days.  If your surgeon used skin glue on the incision, you may shower in 24 hours.  The glue will flake off over the next 2-3 weeks.  Any sutures or staples will be removed at the office during your follow-up visit.  ACTIVITIES:  You may resume regular (light) daily activities beginning the next day-such as daily self-care, walking, climbing stairs-gradually increasing activities as tolerated.  You may have sexual intercourse when it is comfortable.  Refrain from any heavy lifting or straining until approved by your doctor.  You may drive when you are no longer taking prescription pain medication, you can comfortably wear a seatbelt, and you can safely maneuver your car and apply brakes.  You should see your doctor in the office for a follow-up appointment approximately 2-3 weeks after your surgery.  Make sure that  you call for this appointment within a day or two after you arrive home to insure a convenient appointment time.  WHEN TO CALL YOUR DOCTOR: Fever over 101.0 Inability to urinate Continued bleeding from incision Increased pain, redness, or drainage from the incision Increasing abdominal pain  The clinic  staff is available to answer your questions during regular business hours.  Please don't hesitate to call and ask to speak to one of the nurses for clinical concerns.  If you have a medical emergency, go to the nearest emergency room or call 911.  A surgeon from [facility] is always on call for the hospital.  Earnstine Regal, MD, [facility] Office: [place] Free:  [phone] FAX [phone]  Web site: [email or web address]     Increase activity slowly    Complete by:  As directed      No dressing needed    Complete by:  As directed             Medication List         ibuprofen 600 MG tablet  Commonly known as:  ADVIL,MOTRIN  Take 600 mg by mouth every 6 (six) hours as needed for moderate pain.     levonorgestrel-ethinyl estradiol 0.1-20 MG-MCG tablet  Commonly known as:  AVIANE,ALESSE,LESSINA  Take 1 tablet by mouth daily.     ondansetron 4 MG disintegrating tablet  Commonly known as:  ZOFRAN-ODT  Take 1 tablet (4 mg total) by mouth every 8 (eight) hours as needed for nausea or vomiting.     oxyCODONE-acetaminophen 5-325 MG per tablet  Commonly known as:  PERCOCET/ROXICET  Take 1-2 tablets by mouth every 4 (four) hours as needed for moderate pain.           Follow-up Information   Follow up with HOXWORTH,BENJAMIN T, MD. Schedule an appointment as soon as possible for a visit in 3 weeks. (For wound re-check)    Specialty:  General Surgery   Contact information:   [address] [place] [address] [phone]       Earnstine Regal, MD, [facility] Office: [phone]   Signed: Earnstine Regal 01/06/2014, 8:05 AM

## 2014-01-07 ENCOUNTER — Encounter (HOSPITAL_COMMUNITY): Payer: Self-pay | Admitting: General Surgery

## 2014-01-08 ENCOUNTER — Other Ambulatory Visit (INDEPENDENT_AMBULATORY_CARE_PROVIDER_SITE_OTHER): Payer: Self-pay | Admitting: General Surgery

## 2014-01-08 ENCOUNTER — Telehealth (INDEPENDENT_AMBULATORY_CARE_PROVIDER_SITE_OTHER): Payer: Self-pay

## 2014-01-08 DIAGNOSIS — R112 Nausea with vomiting, unspecified: Secondary | ICD-10-CM

## 2014-01-08 MED ORDER — OXYCODONE-ACETAMINOPHEN 5-325 MG PO TABS: 1.0000 | ORAL_TABLET | ORAL | Status: DC | PRN

## 2014-01-08 NOTE — Telephone Encounter (Signed)
OK to refill 20 tabs if someone can write it.  Inform pt this is somewhat excessive pain med use for this procedure and this will be all I will write for.

## 2014-01-08 NOTE — Telephone Encounter (Signed)
Called and spoke to patient to make aware of rx for Oxycodone will be at the front desk for pick up.  Patient also, advised this will be the last prescription for narcotic pain medications per Dr. Excell Seltzer.  Patient verbalized understanding.

## 2014-01-08 NOTE — Telephone Encounter (Signed)
Patient calling into office requesting a refill on Oxycodone 5mg /325mg .  Patient states she's having to take 2 tablets every 4 hours for pain.  Patient denies having any fever, chills, nausea or vomiting.  Patient status post Laparoscopic Cholecystecomy w/IOC on 01/05/14.  Patient aware that message will be sent to Dr. Excell Seltzer and will call patient back accordingly.

## 2014-01-09 NOTE — ED Provider Notes (Signed)
CSN: [id number]     Arrival date & time 01/05/14  2423 History   First MD Initiated Contact with Patient 01/05/14 1020     Chief Complaint  Patient presents with  . Abdominal Pain  . Nausea  . Emesis     (Consider location/radiation/quality/duration/timing/severity/associated sxs/prior Treatment) HPI  28 year old female with abdominal pain. Patient has a known history of biliary dyskinesia. Her pain has been ongoing for the past 5-6 months. Feels like it is increasing in intensity and frequency. She has a scheduled cholecystectomy in less than a week. Despite this, she spends the emergency room several times for pain control reasons. Denies any acute change in her symptoms. No fevers or chills. Nausea and anorexia. No urinary complaints. No diarrhea. Has been taking Percocet and ibuprofen with minimal relief.  Past Medical History  Diagnosis Date  . Gastric reflux   . Anxiety   . Basedow's disease   . Pyelonephritis   . History of nephrolithiasis   . Anxiety disorder   . Bipolar disorder   . Interstitial cystitis   . Chronic back pain   . Fibroid    Past Surgical History  Procedure Laterality Date  . Tonsillectomy    . Adenoidectomy    . Tear duct probing    . Cholecystectomy N/A 01/05/2014    Procedure: LAPAROSCOPIC CHOLECYSTECTOMY WITH INTRAOPERATIVE CHOLANGIOGRAM;  Surgeon: Edward Jolly, MD;  Location: [facility];  Service: General;  Laterality: N/A;   Family History  Problem Relation Age of Onset  . Diabetes    . Hypertension    . Lung cancer    . Bipolar disorder Sister    History  Substance Use Topics  . Smoking status: Current Some Day Smoker -- 0.25 packs/day for 8 years    Types: Cigarettes  . Smokeless tobacco: Never Used  . Alcohol Use: No     Comment: Couple months since last use of alcohol states pt.  Pt reports that she would drink 1/2 of a fifth to take away emotional pain.    OB History   Grav Para Term Preterm Abortions TAB SAB Ect Mult Living    1 1 1       1      Review of Systems  All systems reviewed and negative, other than as noted in HPI.   Allergies  Review of patient's allergies indicates no known allergies.  Home Medications   Prior to Admission medications   Medication Sig Start Date End Date Taking? Authorizing Provider  ibuprofen (ADVIL,MOTRIN) 600 MG tablet Take 600 mg by mouth every 6 (six) hours as needed for moderate pain. 01/01/14  Yes Antonietta Breach, PA-C  levonorgestrel-ethinyl estradiol (AVIANE,ALESSE,LESSINA) 0.1-20 MG-MCG tablet Take 1 tablet by mouth daily.   Yes Historical Provider, MD  ondansetron (ZOFRAN-ODT) 4 MG disintegrating tablet Take 1 tablet (4 mg total) by mouth every 8 (eight) hours as needed for nausea or vomiting. 01/06/14   Earnstine Regal, MD  oxyCODONE-acetaminophen (PERCOCET/ROXICET) 5-325 MG per tablet Take 1-2 tablets by mouth every 4 (four) hours as needed for moderate pain. 01/08/14   Harl Bowie, MD   BP 93/58  Pulse 82  Temp(Src) 97.8 F (36.6 C) (Oral)  Resp 18  Ht 5\' 6"  (1.676 m)  Wt 160 lb (72.576 kg)  BMI 25.84 kg/m2  SpO2 98%  LMP 12/19/2013 Physical Exam  Nursing note and vitals reviewed. Constitutional: She appears well-developed and well-nourished. No distress.  HENT:  Head: Normocephalic and atraumatic.  Eyes: Conjunctivae are  normal. Right eye exhibits no discharge. Left eye exhibits no discharge.  Neck: Neck supple.  Cardiovascular: Normal rate, regular rhythm and normal heart sounds.  Exam reveals no gallop and no friction rub.   No murmur heard. Pulmonary/Chest: Effort normal and breath sounds normal. No respiratory distress.  Abdominal: Soft. She exhibits no distension. There is tenderness. There is no rebound.  Diffuse abdominal tenderness, but worse periumbilically, right upper quadrant and epigastrium. Voluntary guarding. No rebound. No distention.  Musculoskeletal: She exhibits no edema and no tenderness.  Neurological: She is alert.  Skin: Skin is  warm and dry.  Psychiatric: She has a normal mood and affect. Her behavior is normal. Thought content normal.    ED Course  Procedures (including critical care time) Labs Review Labs Reviewed  COMPREHENSIVE METABOLIC PANEL - Abnormal; Notable for the following:    Glucose, Bld 104 (*)    Total Bilirubin <0.2 (*)    All other components within normal limits  LIPASE, BLOOD  SURGICAL PATHOLOGY    Imaging Review No results found.   EKG Interpretation None      MDM   Final diagnoses:  Gallstones    Note: Above diagnosis should read biliary dyskinesia, not gallstones.  28 year old female with abdominal pain. Ongoing. No biliary dyskinesia. Has upcoming surgery scheduled for less than a week from now. She's been seen in the emergency room multiple times despite this. Labs today are pretty unremarkable. There is no peritonitis. Given her repeat emergency room evaluations though, will discuss with Gen. surgery.    Virgel Manifold, MD 01/09/14 [phone]

## 2014-01-10 ENCOUNTER — Ambulatory Visit (HOSPITAL_COMMUNITY): Admission: RE | Admit: 2014-01-10 | Payer: 59 | Source: Ambulatory Visit | Admitting: Surgery

## 2014-01-10 ENCOUNTER — Encounter (HOSPITAL_COMMUNITY): Admission: RE | Payer: Self-pay | Source: Ambulatory Visit

## 2014-01-10 SURGERY — LAPAROSCOPIC CHOLECYSTECTOMY WITH INTRAOPERATIVE CHOLANGIOGRAM
Anesthesia: General

## 2014-01-24 ENCOUNTER — Ambulatory Visit (INDEPENDENT_AMBULATORY_CARE_PROVIDER_SITE_OTHER): Payer: 59 | Admitting: General Surgery

## 2014-01-24 VITALS — BP 100/66 | HR 95 | Temp 98.1°F | Ht 66.5 in | Wt 165.1 lb

## 2014-01-24 DIAGNOSIS — Z09 Encounter for follow-up examination after completed treatment for conditions other than malignant neoplasm: Secondary | ICD-10-CM

## 2014-01-24 NOTE — Progress Notes (Signed)
Chief complaint: Followup cholecystectomy  History: Patient returns to the office status post urgent laparoscopic cholecystectomy for worsening biliary dyskinesia/chronic cholecystitis. She is recently postpartum. Surgery date was 01/07/2014. Happily, she reports she has had relief of her symptoms from the surgery. Still has some twinges of discomfort in the right upper quadrant that are improving but she states nothing like what she was having preoperatively.  Exam: BP 100/66  Pulse 95  Temp(Src) 98.1 F (36.7 C) (Oral)  Ht 5' 6.5" (1.689 m)  Wt 165 lb 2 oz (74.9 kg)  BMI 26.26 kg/m2 General: Appears well no distress Abdomen: Soft and nontender. Incisions well healed without hernias or infection or other complication  Pathology: Revealed chronic cholecystitis  Assessment and plan: Doing well following laparoscopic cholecystectomy with excellent early relief of symptoms. At this point she is discharged from my care to return as needed.

## 2014-02-05 ENCOUNTER — Telehealth (INDEPENDENT_AMBULATORY_CARE_PROVIDER_SITE_OTHER): Payer: Self-pay

## 2014-02-05 NOTE — Telephone Encounter (Signed)
Pt s/p lap chole 01/05/14 by Dr Excell Seltzer. Pt states that she has gone back to work and she has noticed that she has some pain at the umbilical site. Pt denies any n/v, or excessive pain. Advised pt to scale back on her activities and then increase as tolerated. Advised pt that she can take up to 800mg  Ibuprofen every 8 hours as well as place heat/ice on the area. Advised pt to call our office back if she notices any n/v, fevers, chills or excessive pain. Pt verbalized understanding.

## 2014-02-21 ENCOUNTER — Ambulatory Visit: Payer: Self-pay | Admitting: Family Medicine

## 2014-02-21 ENCOUNTER — Emergency Department
Admission: EM | Admit: 2014-02-21 | Discharge: 2014-02-21 | Disposition: A | Discharge status: Home / Self Care | Payer: 59 | Source: Home / Self Care | Attending: Emergency Medicine | Admitting: Emergency Medicine

## 2014-02-21 ENCOUNTER — Encounter: Payer: Self-pay | Admitting: Emergency Medicine

## 2014-02-21 DIAGNOSIS — L03317 Cellulitis of buttock: Secondary | ICD-10-CM

## 2014-02-21 DIAGNOSIS — L0231 Cutaneous abscess of buttock: Secondary | ICD-10-CM

## 2014-02-21 DIAGNOSIS — N390 Urinary tract infection, site not specified: Secondary | ICD-10-CM

## 2014-02-21 LAB — POCT URINALYSIS DIP (MANUAL ENTRY)
Bilirubin, UA: NEGATIVE
Glucose, UA: NEGATIVE
Leukocytes, UA: NEGATIVE
Nitrite, UA: NEGATIVE
Protein Ur, POC: NEGATIVE
Spec Grav, UA: 1.015 (ref 1.005–1.03)
Urobilinogen, UA: 0.2 (ref 0–1)
pH, UA: 6.5 (ref 5–8)

## 2014-02-21 MED ORDER — CEPHALEXIN 500 MG PO CAPS: 500.0000 mg | ORAL_CAPSULE | Freq: Three times a day (TID) | ORAL | Status: DC

## 2014-02-21 NOTE — ED Provider Notes (Signed)
CSN: [id number]     Arrival date & time 02/21/14  1157 History   First MD Initiated Contact with Patient 02/21/14 1201     Chief Complaint  Patient presents with  . Wound Infection  . Urinary Tract Infection    HPI 2 chief complaints.  First, complains of left medial buttock skin infection. Soreness for 2 days. No drainage. Denies fever or chills. Denies vaginal or GYN symptoms. Denies chance of pregnancy .   Second complaint is UTI symptoms x 3 days.  + dysuria + frequency + urgency No hematuria No vaginal discharge No fever/chills No lower abdominal pain No nausea No vomiting No back pain No fatigue She denies chance of pregnancy.--LMP was normal one week ago Has tried 2 days of Macrobid that she had at home, no improvement .  Has nonspecific headache, ibuprofen helped somewhat. No focal neurologic symptoms. No visual change.   Past Medical History  Diagnosis Date  . Gastric reflux   . Anxiety   . Basedow's disease   . Pyelonephritis   . History of nephrolithiasis   . Anxiety disorder   . Bipolar disorder   . Interstitial cystitis   . Chronic back pain   . Fibroid    Past Surgical History  Procedure Laterality Date  . Tonsillectomy    . Adenoidectomy    . Tear duct probing    . Cholecystectomy N/A 01/05/2014    Procedure: LAPAROSCOPIC CHOLECYSTECTOMY WITH INTRAOPERATIVE CHOLANGIOGRAM;  Surgeon: Edward Jolly, MD;  Location: [facility];  Service: General;  Laterality: N/A;   Family History  Problem Relation Age of Onset  . Diabetes    . Hypertension    . Lung cancer    . Bipolar disorder Sister    History  Substance Use Topics  . Smoking status: Current Some Day Smoker -- 0.25 packs/day for 8 years    Types: Cigarettes  . Smokeless tobacco: Never Used  . Alcohol Use: No     Comment: Couple months since last use of alcohol states pt.  Pt reports that she would drink 1/2 of a fifth to take away emotional pain.    OB History   Grav Para Term Preterm  Abortions TAB SAB Ect Mult Living   1 1 1       1      Review of Systems  All other systems reviewed and are negative.   Allergies  Review of patient's allergies indicates no known allergies.  Home Medications   Prior to Admission medications   Medication Sig Start Date End Date Taking? Authorizing Provider  nitrofurantoin, macrocrystal-monohydrate, (MACROBID) 100 MG capsule Take 100 mg by mouth 2 (two) times daily. Patient had some at home and started taking x 2 days ago. However she has not taken any Macrobid today.   Yes Historical Provider, MD  ibuprofen (ADVIL,MOTRIN) 600 MG tablet Take 600 mg by mouth every 6 (six) hours as needed for moderate pain. 01/01/14   Antonietta Breach, PA-C  levonorgestrel-ethinyl estradiol (AVIANE,ALESSE,LESSINA) 0.1-20 MG-MCG tablet Take 1 tablet by mouth daily.    Historical Provider, MD   BP 117/80  Pulse 77  Temp(Src) 97.6 F (36.4 C) (Oral)  Resp 16  Ht 5\' 6"  (1.676 m)  Wt 167 lb (75.751 kg)  BMI 26.97 kg/m2  SpO2 98%  LMP 02/14/2014  Breastfeeding? Unknown Physical Exam  Nursing note and vitals reviewed. Constitutional: She is oriented to person, place, and time. She appears well-developed and well-nourished. No distress.  HENT:  Mouth/Throat:  Oropharynx is clear and moist.  Eyes: No scleral icterus.  Neck: Neck supple.  Cardiovascular: Normal rate, regular rhythm and normal heart sounds.   Pulmonary/Chest: Breath sounds normal.  Abdominal: Soft. She exhibits no mass. There is no hepatosplenomegaly. There is tenderness in the suprapubic area. There is no rebound, no guarding and no CVA tenderness.  Lymphadenopathy:    She has no cervical adenopathy.  Neurological: She is alert and oriented to person, place, and time.  Skin: Skin is warm and dry.     Nurse chaperone, Stanton Kidney assisted with exam. Medial left buttock: 1 x 1 cm area of erythema and induration with a mostly healed dry superficial ulcer without drainage or exudate or fluctuance. No  heat or red streaks.    ED Course  Procedures (including critical care time) Labs Review Labs Reviewed  POCT URINALYSIS DIP (MANUAL ENTRY)   Results for orders placed during the hospital encounter of 02/21/14  POCT URINALYSIS DIP (MANUAL ENTRY)      Result Value Ref Range   Color, UA yellow     Clarity, UA clear     Glucose, UA neg     Bilirubin, UA negative     Bilirubin, UA       Spec Grav, UA 1.015  1.005 - 1.03   Blood, UA trace-lysed     pH, UA 6.5  5 - 8   Protein Ur, POC negative     Urobilinogen, UA 0.2  0 - 1   Nitrite, UA Negative     Leukocytes, UA Negative        Imaging Review No results found.   MDM   1. Urinary tract infection, site not specified   2. Cellulitis of buttock, left    By history and based on trace blood and urinalysis, she may have a partially treated uncomplicated UTI.--As she took Macrodantin for 2 days. Also has very small cellulitis left buttock without evidence of abscess . Treatment options discussed, as well as risks, benefits, alternatives. Patient voiced understanding and agreement with the following plans: Send off urine culture. She prefers to treat both UTI and cellulitis with one antibiotic, so I selected cephalexin 500 3 times a day x10 days. Other symptomatic care discussed. Note written to excuse from work today and tomorrow. Follow-up with your primary care doctor in 3-4 days if not improving, or sooner if symptoms become worse. Precautions discussed. Red flags discussed. Questions invited and answered. Patient voiced understanding and agreement.     Jacqulyn Cane, MD 02/21/14 1258

## 2014-02-21 NOTE — ED Notes (Signed)
C/o possible skin infection to perineal area.  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  Also c/o s/s of UTI.  Hx of UTI's.  Third c/o of headache.

## 2014-02-23 ENCOUNTER — Telehealth: Payer: Self-pay | Admitting: Emergency Medicine

## 2014-02-23 LAB — URINE CULTURE
Colony Count: NO GROWTH
Organism ID, Bacteria: NO GROWTH

## 2014-02-24 ENCOUNTER — Telehealth: Payer: Self-pay | Admitting: Emergency Medicine

## 2014-02-24 NOTE — ED Notes (Signed)
Inquired about patient's status; encourage them to call with questions/concerns.  

## 2014-02-28 ENCOUNTER — Ambulatory Visit (INDEPENDENT_AMBULATORY_CARE_PROVIDER_SITE_OTHER): Payer: 59 | Admitting: Medical

## 2014-02-28 ENCOUNTER — Encounter: Payer: Self-pay | Admitting: Medical

## 2014-02-28 VITALS — BP 121/84 | HR 73 | Temp 98.7°F | Ht 65.5 in | Wt 172.8 lb

## 2014-02-28 DIAGNOSIS — R300 Dysuria: Secondary | ICD-10-CM

## 2014-02-28 DIAGNOSIS — N390 Urinary tract infection, site not specified: Secondary | ICD-10-CM | POA: Insufficient documentation

## 2014-02-28 LAB — POCT URINALYSIS DIPSTICK
Bilirubin, UA: NEGATIVE
Blood, UA: NEGATIVE
Glucose, UA: NEGATIVE
Ketones, UA: NEGATIVE
Nitrite, UA: NEGATIVE
PROTEIN UA: 5
Spec Grav, UA: 1.01
UROBILINOGEN UA: 0.2
pH, UA: 7

## 2014-02-28 MED ORDER — PHENAZOPYRIDINE HCL 200 MG PO TABS: 200.0000 mg | ORAL_TABLET | Freq: Three times a day (TID) | ORAL | Status: DC | PRN

## 2014-02-28 MED ORDER — CEFTRIAXONE SODIUM 1 G IJ SOLR
1.0000 g | Freq: Once | INTRAMUSCULAR | Status: AC
  Administered 2014-02-28: 1 g via INTRAMUSCULAR

## 2014-02-28 MED ORDER — SULFAMETHOXAZOLE-TMP DS 800-160 MG PO TABS: 1.0000 | ORAL_TABLET | Freq: Two times a day (BID) | ORAL | Status: DC

## 2014-02-28 NOTE — Progress Notes (Signed)
   Subjective:    Patient ID: [name], female    DOB: [birth year], 28 y.o.   MRN: [id number]  HPI  Pt states last week she had a uti. Pt went to urgent care in [place]. Pt given keflex for staph infection and the uti. I don't see a culture result in computer. [facility] Pt has burning and frequent urination. Pt has some chills. No nausea or vomiting. Some rt cva area pain. This is despite being on keflx. Pt has taken levaquin for 4 days. She states 500 mg tabs. Pt does not know how old that prescription is 56 2 yrs old or more. LMP- 2 weeks ago. Expected time and normal duration.   She states area of skin infection is resolved.     Review of Systems  Constitutional: Positive for chills. Negative for fever and fatigue.  HENT: Negative.   Respiratory: Negative for cough, choking, chest tightness and wheezing.   Cardiovascular: Negative for chest pain and palpitations.  Gastrointestinal: Negative for abdominal pain, diarrhea, constipation, blood in stool and anal bleeding.  Genitourinary: Positive for dysuria and frequency.  Musculoskeletal: Positive for back pain.       Rt cva area.  Skin: Negative.   Neurological: Negative.        Objective:   Physical Exam  General  Mental Status- Alert. Orientation- Orientation x 4.   Skin General:- Normal. Moisture- Dry. Temperature- Warm.    Heart Ausculation-RRR  Lungs Ausculation- Clear, even, unlabored bilaterlly.    Abdomen Palpation/Percussion: Palpation and Percussion of the abdomen reveal- Non Tender, No Rebound tenderness, No Rigidity(guarding), No Palpable abdominal masses and No jar tenderness. No suprapubic tenderness. Liver:-Normal. Spleen:- Normal. Other Characteristics- No Costovertebral angle tenderness- Left or Costovertebral angle tenderness- Right.  Auscultation: Auscultation of the abdomen reveals- Bowel Sounds normal.  Back Rt cva tenderness moderate.        Assessment &  Plan:

## 2014-02-28 NOTE — Patient Instructions (Addendum)
You appear to have a urinary infection. May have kidney involvement on rt side as well. Stop levaquin as this may be expired. We will send your urine out for culture. I want you to take bactrim ds bid x 7 days pending culture results. If your symptoms worsen or change then notify us. If severe then ED evaluation. We gave you rocephin 1 gram im today. Follow up in 7 days or as needed.  Pt mentioned briefly hx of ADD. Desire to start school in January. I asked her to schedule separate appointment for add in November.

## 2014-03-02 LAB — CULTURE, URINE COMPREHENSIVE
COLONY COUNT: NO GROWTH
Organism ID, Bacteria: NO GROWTH

## 2014-03-14 ENCOUNTER — Ambulatory Visit (INDEPENDENT_AMBULATORY_CARE_PROVIDER_SITE_OTHER): Payer: 59 | Admitting: Medical

## 2014-03-14 ENCOUNTER — Encounter: Payer: Self-pay | Admitting: Medical

## 2014-03-14 VITALS — BP 125/88 | HR 74 | Temp 98.3°F | Ht 65.5 in | Wt 169.8 lb

## 2014-03-14 DIAGNOSIS — H538 Other visual disturbances: Secondary | ICD-10-CM

## 2014-03-14 DIAGNOSIS — H579 Unspecified disorder of eye and adnexa: Secondary | ICD-10-CM

## 2014-03-14 NOTE — Assessment & Plan Note (Signed)
Some recently and optometrist will evaluate patient today as discussed on the eye pressure diagnosis/problem.

## 2014-03-14 NOTE — Assessment & Plan Note (Signed)
Is unclear why patient has eye pressure but she is seeing her optometrist today. If severe diagnosis found then optometrist may consult with ophthalmologist.
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
## 2014-03-14 NOTE — Patient Instructions (Addendum)
I want you to see your optometrist tomorrow for dilated eye exam and for pressure testing.(Please call us when optometrist gives there opinion)  If your vision changes acutely, worse vision, or increasing pressure then ED evaluation.  Follow up in 2 wks  For ADD evauation.(Could not address both complaints today)  I did call your optometrist at [phone]. Talked with receptionist and asked for pt to be seen for the eye complaints today. I asked pt to call optometrist  as well later this afternoon and see if they will see her today or tomorrow.  I did get referral personnel from our office to call the patient's optometrist. By the time our staff member/Jennifer called optometrist, the patient had already arranged to be seen today.

## 2014-03-15 ENCOUNTER — Telehealth: Payer: Self-pay | Admitting: Family Medicine

## 2014-03-15 NOTE — Telephone Encounter (Signed)
emmi emailed °

## 2014-03-27 ENCOUNTER — Ambulatory Visit (INDEPENDENT_AMBULATORY_CARE_PROVIDER_SITE_OTHER): Payer: 59 | Admitting: Internal Medicine

## 2014-03-27 ENCOUNTER — Encounter: Payer: Self-pay | Admitting: Internal Medicine

## 2014-03-27 VITALS — BP 121/86 | HR 82 | Temp 97.7°F | Wt 170.0 lb

## 2014-03-27 DIAGNOSIS — J020 Streptococcal pharyngitis: Secondary | ICD-10-CM

## 2014-03-27 DIAGNOSIS — R52 Pain, unspecified: Secondary | ICD-10-CM

## 2014-03-27 DIAGNOSIS — R059 Cough, unspecified: Secondary | ICD-10-CM

## 2014-03-27 DIAGNOSIS — R05 Cough: Secondary | ICD-10-CM

## 2014-03-27 DIAGNOSIS — J209 Acute bronchitis, unspecified: Secondary | ICD-10-CM

## 2014-03-27 DIAGNOSIS — R5081 Fever presenting with conditions classified elsewhere: Secondary | ICD-10-CM

## 2014-03-27 LAB — POCT INFLUENZA A/B
Influenza A, POC: NEGATIVE
Influenza B, POC: NEGATIVE

## 2014-03-27 MED ORDER — HYDROCODONE-HOMATROPINE 5-1.5 MG/5ML PO SYRP: 5.0000 mL | ORAL_SOLUTION | Freq: Four times a day (QID) | ORAL | Status: DC | PRN

## 2014-03-27 MED ORDER — AZITHROMYCIN 250 MG PO TABS: ORAL_TABLET | ORAL | Status: DC

## 2014-03-27 NOTE — Progress Notes (Signed)
Subjective:    Patient ID: [name], female    DOB: Jul 16, 1985, 28 y.o.   MRN: [id number]  DOS:  03/27/2014 Type of visit - description : acute Interval history: Symptoms started 2 days ago with persisting cough, green sputum production, no hemoptysis. Yesterday had a fever of 103. Admits to HA but only when she coughs. Admits to chest pain, anteriorly at the tracheal area worse when she coughs.  ROS Mild runny nose. No actual sinus pain or congestion Mild difficulty breathing and wheezing. No dysuria or gross hematuria Was seen recently with eye symptoms, they resolved  Past Medical History  Diagnosis Date  . Gastric reflux   . Anxiety   . Basedow's disease   . Pyelonephritis   . History of nephrolithiasis   . Anxiety disorder   . Bipolar disorder   . Interstitial cystitis   . Chronic back pain   . Fibroid     Past Surgical History  Procedure Laterality Date  . Tonsillectomy    . Adenoidectomy    . Tear duct probing    . Cholecystectomy N/A 01/05/2014    Procedure: LAPAROSCOPIC CHOLECYSTECTOMY WITH INTRAOPERATIVE CHOLANGIOGRAM;  Surgeon: Edward Jolly, MD;  Location: [facility];  Service: General;  Laterality: N/A;    History   Social History  . Marital Status: Single    Spouse Name: N/A    Number of Children: N/A  . Years of Education: N/A   Occupational History  . driver     [employer]   Social History Main Topics  . Smoking status: Current Some Day Smoker -- 0.25 packs/day for 8 years    Types: Cigarettes  . Smokeless tobacco: Never Used  . Alcohol Use: No     Comment: Couple months since last use of alcohol states pt.  Pt reports that she would drink 1/2 of a fifth to take away emotional pain.   . Drug Use: No  . Sexual Activity: Yes    Birth Control/ Protection: None   Other Topics Concern  . Not on file   Social History Narrative  . No narrative on file        Medication List       This list is accurate as of: 03/27/14   6:51 PM.  Always use your most recent med list.               azithromycin 250 MG tablet  Commonly known as:  ZITHROMAX Z-PAK  2 tabs a day the first day, then 1 tab a day x 4 days     HYDROcodone-homatropine 5-1.5 MG/5ML syrup  Commonly known as:  HYCODAN  Take 5 mLs by mouth every 6 (six) hours as needed for cough.     ibuprofen 600 MG tablet  Commonly known as:  ADVIL,MOTRIN  Take 600 mg by mouth every 6 (six) hours as needed for moderate pain.     levonorgestrel-ethinyl estradiol 0.1-20 MG-MCG tablet  Commonly known as:  AVIANE,ALESSE,LESSINA  Take 1 tablet by mouth daily.           Objective:   Physical Exam BP 121/86  Pulse 82  Temp(Src) 97.7 F (36.5 C) (Oral)  Wt 170 lb (77.111 kg)  SpO2 93%  LMP 03/14/2014  Breastfeeding? No General -- alert, well-developed, NAD except for persistent cough.   HEENT-- Not pale.  R Ear-- normal L ear-- normal Throat symmetric, no redness or discharge. Face symmetric, sinuses not tender to palpation. Nose slt congested.  Lungs -- normal respiratory effort, no intercostal retractions, no accessory muscle use, and normal breath sounds.  Heart-- normal rate, regular rhythm, no murmur.  Extremities-- no pretibial edema bilaterally  Neurologic--  alert & oriented X3. Speech normal, gait appropriate for age, strength symmetric and appropriate for age.  Psych-- Cognition and judgment appear intact. Cooperative with normal attention span and concentration. No anxious or depressed appearing.     Assessment & Plan:  Bronchitis, Flu test came back negative, she has persisting cough, likely bronchitis. Plan: Cough suppression with hydrocodone, Z-Pak. Patient advised to call me if not improving, she will need  Further eval including an x-ray

## 2014-03-27 NOTE — Progress Notes (Signed)
Pre visit review using our clinic review tool, if applicable. No additional management support is needed unless otherwise documented below in the visit note. 

## 2014-03-27 NOTE — Patient Instructions (Signed)
Rest, fluids , tylenol If  cough, take Mucinex DM twice a day as needed  If the cough is severe, take hydrocodone. Will cause drowsiness Take the antibiotic as prescribed  (zpack); It may affect the affectivity of  birth-control pills Call if not gradually better over the next  10 days Call anytime if the symptoms are severe, you have high fever, short of breath, increeased chest pain

## 2014-04-01 ENCOUNTER — Telehealth: Payer: Self-pay | Admitting: Family Medicine

## 2014-04-01 ENCOUNTER — Telehealth: Payer: Self-pay | Admitting: *Deleted

## 2014-04-01 ENCOUNTER — Ambulatory Visit (INDEPENDENT_AMBULATORY_CARE_PROVIDER_SITE_OTHER): Payer: 59 | Admitting: Medical

## 2014-04-01 ENCOUNTER — Encounter: Payer: Self-pay | Admitting: Medical

## 2014-04-01 VITALS — BP 128/94 | HR 86 | Temp 98.4°F | Ht 65.5 in | Wt 171.2 lb

## 2014-04-01 DIAGNOSIS — F909 Attention-deficit hyperactivity disorder, unspecified type: Secondary | ICD-10-CM

## 2014-04-01 DIAGNOSIS — F988 Other specified behavioral and emotional disorders with onset usually occurring in childhood and adolescence: Secondary | ICD-10-CM

## 2014-04-01 MED ORDER — AMPHETAMINE-DEXTROAMPHETAMINE 30 MG PO TABS: 30.0000 mg | ORAL_TABLET | Freq: Every day | ORAL | Status: DC

## 2014-04-01 NOTE — Progress Notes (Signed)
Pre visit review using our clinic review tool, if applicable. No additional management support is needed unless otherwise documented below in the visit note. 

## 2014-04-01 NOTE — Assessment & Plan Note (Signed)
Pt scored high on questionnaire. She was diagnosed in 4th grade and has been meds intermittently after high school. Mostly when taking courses. She recently restarted college level courses so I went ahead and gave rx. She signed controlled medicine agreement today and I plan on getting UDS when she is in for follow up.   I did review her chart today and dx of bipolar. Pt did not seem to be either depressed or manic today. Normal affect and behavior. Will monitor her while on medicine make sure her mood remains stable.

## 2014-04-01 NOTE — Telephone Encounter (Signed)
emmi emailed °

## 2014-04-01 NOTE — Patient Instructions (Addendum)
I am starting you on adderall for add. Start medication and avoid caffeine beverages. If you have any side effects stop meds and notify us. Follow up in one month or as needed.  Follow up in one month or as needed.    Attention Deficit Hyperactivity Disorder Attention deficit hyperactivity disorder (ADHD) is a problem with behavior issues based on the way the brain functions (neurobehavioral disorder). It is a common reason for behavior and academic problems in school. SYMPTOMS  There are 3 types of ADHD. The 3 types and some of the symptoms include:  Inattentive.  Gets bored or distracted easily.  Loses or forgets things. Forgets to hand in homework.  Has trouble organizing or completing tasks.  Difficulty staying on task.  An inability to organize daily tasks and school work.  Leaving projects, chores, or homework unfinished.  Trouble paying attention or responding to details. Careless mistakes.  Difficulty following directions. Often seems like is not listening.  Dislikes activities that require sustained attention (like chores or homework).  Hyperactive-impulsive.  Feels like it is impossible to sit still or stay in a seat. Fidgeting with hands and feet.  Trouble waiting turn.  Talking too much or out of turn. Interruptive.  Speaks or acts impulsively.  Aggressive, disruptive behavior.  Constantly busy or on the go; noisy.  Often leaves seat when they are expected to remain seated.  Often runs or climbs where it is not appropriate, or feels very restless.  Combined.  Has symptoms of both of the above. Often children with ADHD feel discouraged about themselves and with school. They often perform well below their abilities in school. As children get older, the excess motor activities can calm down, but the problems with paying attention and staying organized persist. Most children do not outgrow ADHD but with good treatment can learn to cope with the  symptoms. DIAGNOSIS  When ADHD is suspected, the diagnosis should be made by professionals trained in ADHD. This professional will collect information about the individual suspected of having ADHD. Information must be collected from various settings where the person lives, works, or attends school.  Diagnosis will include:  Confirming symptoms began in childhood.  Ruling out other reasons for the child's behavior.  The health care providers will check with the child's school and check their medical records.  They will talk to teachers and parents.  Behavior rating scales for the child will be filled out by those dealing with the child on a daily basis. A diagnosis is made only after all information has been considered. TREATMENT  Treatment usually includes behavioral treatment, tutoring or extra support in school, and stimulant medicines. Because of the way a person's brain works with ADHD, these medicines decrease impulsivity and hyperactivity and increase attention. This is different than how they would work in a person who does not have ADHD. Other medicines used include antidepressants and certain blood pressure medicines. Most experts agree that treatment for ADHD should address all aspects of the person's functioning. Along with medicines, treatment should include structured classroom management at school. Parents should reward good behavior, provide constant discipline, and set limits. Tutoring should be available for the child as needed. ADHD is a lifelong condition. If untreated, the disorder can have long-term serious effects into adolescence and adulthood. HOME CARE INSTRUCTIONS   Often with ADHD there is a lot of frustration among family members dealing with the condition. Blame and anger are also feelings that are common. In many cases, because the problem  affects the family as a whole, the entire family may need help. A therapist can help the family find better ways to handle the  disruptive behaviors of the person with ADHD and promote change. If the person with ADHD is young, most of the therapist's work is with the parents. Parents will learn techniques for coping with and improving their child's behavior. Sometimes only the child with the ADHD needs counseling. Your health care providers can help you make these decisions.  Children with ADHD may need help learning how to organize. Some helpful tips include:  Keep routines the same every day from wake-up time to bedtime. Schedule all activities, including homework and playtime. Keep the schedule in a place where the person with ADHD will often see it. Mark schedule changes as far in advance as possible.  Schedule outdoor and indoor recreation.  Have a place for everything and keep everything in its place. This includes clothing, backpacks, and school supplies.  Encourage writing down assignments and bringing home needed books. Work with your child's teachers for assistance in organizing school work.  Offer your child a well-balanced diet. Breakfast that includes a balance of whole grains, protein, and fruits or vegetables is especially important for school performance. Children should avoid drinks with caffeine including:  Soft drinks.  Coffee.  Tea.  However, some older children (adolescents) may find these drinks helpful in improving their attention. Because it can also be common for adolescents with ADHD to become addicted to caffeine, talk with your health care provider about what is a safe amount of caffeine intake for your child.  Children with ADHD need consistent rules that they can understand and follow. If rules are followed, give small rewards. Children with ADHD often receive, and expect, criticism. Look for good behavior and praise it. Set realistic goals. Give clear instructions. Look for activities that can foster success and self-esteem. Make time for pleasant activities with your child. Give lots of  affection.  Parents are their children's greatest advocates. Learn as much as possible about ADHD. This helps you become a stronger and better advocate for your child. It also helps you educate your child's teachers and instructors if they feel inadequate in these areas. Parent support groups are often helpful. A national group with local chapters is called Children and Adults with Attention Deficit Hyperactivity Disorder (CHADD). SEEK MEDICAL CARE IF:  Your child has repeated muscle twitches, cough, or speech outbursts.  Your child has sleep problems.  Your child has a marked loss of appetite.  Your child develops depression.  Your child has new or worsening behavioral problems.  Your child develops dizziness.  Your child has a racing heart.  Your child has stomach pains.  Your child develops headaches. SEEK IMMEDIATE MEDICAL CARE IF:  Your child has been diagnosed with depression or anxiety and the symptoms seem to be getting worse.  Your child has been depressed and suddenly appears to have increased energy or motivation.  You are worried that your child is having a bad reaction to a medication he or she is taking for ADHD. Document Released: 05/21/2002 Document Revised: 06/05/2013 Document Reviewed: 02/05/2013 [facility] Patient Information 2015 [place], [place]. This information is not intended to replace advice given to you by your health care provider. Make sure you discuss any questions you have with your health care provider.

## 2014-04-01 NOTE — Progress Notes (Signed)
Subjective:    Patient ID: [name], female    DOB: Jan 10, 1986, 28 y.o.   MRN: [id number]  HPI  Pt is in school. 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 Pt states pt was diagnosed with ADD in 4th grade. Pt was on ADD medication for 2 years in college.She was on  adderal in college. Pt last menstrual cycle was 2 weeks ago.  She is on ocp.  Pt answered ADHD questionnaire. Pt did score high on questionnaire. Was self report scale but numerous questions rated at often or very often. Questionnaire should be scanned to her chart.  Past Medical History  Diagnosis Date  . Gastric reflux   . Anxiety   . Basedow's disease   . Pyelonephritis   . History of nephrolithiasis   . Anxiety disorder   . Bipolar disorder   . Interstitial cystitis   . Chronic back pain   . Fibroid     History   Social History  . Marital Status: Single    Spouse Name: N/A    Number of Children: N/A  . Years of Education: N/A   Occupational History  . driver     [employer]   Social History Main Topics  . Smoking status: Current Some Day Smoker -- 0.25 packs/day for 8 years    Types: Cigarettes  . Smokeless tobacco: Never Used  . Alcohol Use: No     Comment: Couple months since last use of alcohol states pt.  Pt reports that she would drink 1/2 of a fifth to take away emotional pain.   . Drug Use: No  . Sexual Activity: Yes    Birth Control/ Protection: None   Other Topics Concern  . Not on file   Social History Narrative  . No narrative on file    Past Surgical History  Procedure Laterality Date  . Tonsillectomy    . Adenoidectomy    . Tear duct probing    . Cholecystectomy N/A 01/05/2014    Procedure: LAPAROSCOPIC CHOLECYSTECTOMY WITH INTRAOPERATIVE CHOLANGIOGRAM;  Surgeon:  Jolly, MD;  Location: [facility];  Service: General;  Laterality: N/A;    Family History  Problem Relation Age of Onset  . Diabetes    .  Hypertension    . Lung cancer    . Bipolar disorder Sister     No Known Allergies  Current Outpatient Prescriptions on File Prior to Visit  Medication Sig Dispense Refill  . ibuprofen (ADVIL,MOTRIN) 600 MG tablet Take 600 mg by mouth every 6 (six) hours as needed for moderate pain.      Marland Kitchen levonorgestrel-ethinyl estradiol (AVIANE,ALESSE,LESSINA) 0.1-20 MG-MCG tablet Take 1 tablet by mouth daily.      Marland Kitchen azithromycin (ZITHROMAX Z-PAK) 250 MG tablet 2 tabs a day the first day, then 1 tab a day x 4 days  6 tablet  0  . HYDROcodone-homatropine (HYCODAN) 5-1.5 MG/5ML syrup Take 5 mLs by mouth every 6 (six) hours as needed for cough.  180 mL  0   No current facility-administered medications on file prior to visit.    BP 128/94  Pulse 86  Temp(Src) 98.4 F (36.9 C) (Oral)  Ht 5' 5.5" (1.664 m)  Wt 171 lb 3.2 oz (77.656 kg)  BMI 28.05 kg/m2  SpO2 98%  LMP 03/14/2014            Review of Systems  Constitutional: Negative for fever, chills and fatigue.  Cardiovascular: Negative for chest pain, palpitations and leg swelling.  Endocrine: Negative for polydipsia, polyphagia and polyuria.  Genitourinary: Negative.   Musculoskeletal: Negative.   Neurological: Negative for dizziness, syncope, speech difficulty, numbness and headaches.  Hematological: Negative for adenopathy. Does not bruise/bleed easily.  Psychiatric/Behavioral: Positive for decreased concentration. Negative for suicidal ideas, hallucinations, sleep disturbance, self-injury, dysphoric mood and agitation. The patient is not nervous/anxious and is not hyperactive.        Hx of add.       Objective:   Physical Exam  General Mental Status- Alert. General Appearance- Not in acute distress.   Skin General: Color- Normal Color. Moisture- Normal Moisture.  Neck Carotid Arteries- Normal color. Moisture- Normal Moisture. No carotid bruits. No JVD.  Chest and Lung Exam Auscultation: Breath Sounds:-Normal, clear,  even and unlabored.  Cardiovascular Auscultation:Rhythm- Regular, rate and rhythm. Murmurs & Other Heart Sounds:Auscultation of the heart reveals- No Murmurs.   Neurologic Cranial Nerve exam:- CN III-XII intact(No nystagmus), symmetric smile. Drift Test:- No drift. Romberg Exam:- Negative.  Heal to Toe Gait exam:-Normal. Finger to Nose:- Normal/Intact Strength:- 5/5 equal and symmetric strength both upper and lower extremities.        Assessment & Plan:

## 2014-04-01 NOTE — Telephone Encounter (Signed)
Prior authorization for Adderall 30 mg initiated. Awaiting determination. JG//CMA

## 2014-04-02 NOTE — Telephone Encounter (Signed)
PA for Adderall approved through 04/02/2015. File ID: [id number]. Approval letter sent for scanning. JG//CMA

## 2014-04-15 ENCOUNTER — Encounter: Payer: Self-pay | Admitting: Medical

## 2014-05-02 ENCOUNTER — Encounter: Payer: Self-pay | Admitting: Medical

## 2014-05-02 ENCOUNTER — Ambulatory Visit (INDEPENDENT_AMBULATORY_CARE_PROVIDER_SITE_OTHER): Payer: 59 | Admitting: Medical

## 2014-05-02 VITALS — BP 120/87 | HR 73 | Temp 98.6°F | Ht 65.5 in | Wt 162.2 lb

## 2014-05-02 DIAGNOSIS — Z23 Encounter for immunization: Secondary | ICD-10-CM

## 2014-05-02 DIAGNOSIS — F988 Other specified behavioral and emotional disorders with onset usually occurring in childhood and adolescence: Secondary | ICD-10-CM

## 2014-05-02 DIAGNOSIS — F909 Attention-deficit hyperactivity disorder, unspecified type: Secondary | ICD-10-CM

## 2014-05-02 MED ORDER — AMPHETAMINE-DEXTROAMPHETAMINE 30 MG PO TABS: 30.0000 mg | ORAL_TABLET | Freq: Every day | ORAL | Status: DC

## 2014-05-02 NOTE — Patient Instructions (Addendum)
For your ADD today, we will continue your adderral same dose. Please get UDS today. Provided normal results on uds then come by in one month to pick up rx of adderral refill.

## 2014-05-02 NOTE — Progress Notes (Signed)
Subjective:    Patient ID: [name], female    DOB: [birth year], 28 y.o.   MRN: [id number]  HPI   Pt in for follow up. Pt states her concentration level is a lot better now. Pt is in school and taking cna classes. Just started school program a month ago. Pt had 98 on her 1st  2 tesst so far. ADD dx in 4th grade. No obvious side effect except mild decrease in appetite.   LMP- 3 wks ago. On birth control.   Past Medical History  Diagnosis Date  . Gastric reflux   . Anxiety   . Basedow's disease   . Pyelonephritis   . History of nephrolithiasis   . Anxiety disorder   . Bipolar disorder   . Interstitial cystitis   . Chronic back pain   . Fibroid     History   Social History  . Marital Status: Single    Spouse Name: N/A    Number of Children: N/A  . Years of Education: N/A   Occupational History  . driver     [employer]   Social History Main Topics  . Smoking status: Current Some Day Smoker -- 0.25 packs/day for 8 years    Types: Cigarettes  . Smokeless tobacco: Never Used  . Alcohol Use: No     Comment: Couple months since last use of alcohol states pt.  Pt reports that she would drink 1/2 of a fifth to take away emotional pain.   . Drug Use: No  . Sexual Activity: Yes    Birth Control/ Protection: None   Other Topics Concern  . Not on file   Social History Narrative    Past Surgical History  Procedure Laterality Date  . Tonsillectomy    . Adenoidectomy    . Tear duct probing    . Cholecystectomy N/A 01/05/2014    Procedure: LAPAROSCOPIC CHOLECYSTECTOMY WITH INTRAOPERATIVE CHOLANGIOGRAM;  Surgeon:  Jolly, MD;  Location: [facility];  Service: General;  Laterality: N/A;    Family History  Problem Relation Age of Onset  . Diabetes    . Hypertension    . Lung cancer    . Bipolar disorder Sister     No Known Allergies  Current Outpatient Prescriptions on File Prior to Visit  Medication Sig Dispense Refill  . HYDROcodone-homatropine  (HYCODAN) 5-1.5 MG/5ML syrup Take 5 mLs by mouth every 6 (six) hours as needed for cough. 180 mL 0  . ibuprofen (ADVIL,MOTRIN) 600 MG tablet Take 600 mg by mouth every 6 (six) hours as needed for moderate pain.    Marland Kitchen levonorgestrel-ethinyl estradiol (AVIANE,ALESSE,LESSINA) 0.1-20 MG-MCG tablet Take 1 tablet by mouth daily.     No current facility-administered medications on file prior to visit.    BP 120/87 mmHg  Pulse 73  Temp(Src) 98.6 F (37 C) (Oral)  Ht 5' 5.5" (1.664 m)  Wt 162 lb 3.2 oz (73.573 kg)  BMI 26.57 kg/m2  SpO2 100%  LMP 04/01/2014          Review of Systems  Constitutional: Negative for fever, chills and fatigue.  Respiratory: Negative for cough, chest tightness, shortness of breath and wheezing.   Cardiovascular: Negative for chest pain and palpitations.  Gastrointestinal: Negative.   Neurological: Negative.   Psychiatric/Behavioral: Negative for suicidal ideas, behavioral problems, sleep disturbance, self-injury, decreased concentration and agitation.       Concentration better.       Objective:   Physical Exam  General  Mental Status- Alert. General Appearance- Not in acute distress.     Chest and Lung Exam Auscultation: Breath Sounds:-Normal.  Cardiovascular Auscultation:Rythm- Regular. Murmurs & Other Heart Sounds:Auscultation of the heart reveals- No Murmurs.   Neurologic Cranial Nerve exam:- CN III-XII intact(No nystagmus), symmetric smile. Normal/Intact Strength:- 5/5 equal and symmetric strength both upper and lower extremities.       Assessment & Plan:

## 2014-05-02 NOTE — Progress Notes (Signed)
Pre visit review using our clinic review tool, if applicable. No additional management support is needed unless otherwise documented below in the visit note. 

## 2014-05-02 NOTE — Assessment & Plan Note (Signed)
For your ADD today, we will continue your adderral same dose. Please get UDS today. Provided normal results on uds then come by in one month to pick up rx of adderral refill.

## 2014-05-08 ENCOUNTER — Emergency Department (HOSPITAL_COMMUNITY): Payer: 59

## 2014-05-08 ENCOUNTER — Emergency Department (HOSPITAL_BASED_OUTPATIENT_CLINIC_OR_DEPARTMENT_OTHER)
Admission: EM | Admit: 2014-05-08 | Discharge: 2014-05-08 | Disposition: A | Discharge status: Home / Self Care | Payer: 59 | Attending: Emergency Medicine | Admitting: Emergency Medicine

## 2014-05-08 ENCOUNTER — Emergency Department (HOSPITAL_BASED_OUTPATIENT_CLINIC_OR_DEPARTMENT_OTHER): Payer: 59

## 2014-05-08 ENCOUNTER — Encounter (HOSPITAL_BASED_OUTPATIENT_CLINIC_OR_DEPARTMENT_OTHER): Payer: Self-pay

## 2014-05-08 DIAGNOSIS — Z87442 Personal history of urinary calculi: Secondary | ICD-10-CM | POA: Diagnosis not present

## 2014-05-08 DIAGNOSIS — Z8719 Personal history of other diseases of the digestive system: Secondary | ICD-10-CM | POA: Diagnosis not present

## 2014-05-08 DIAGNOSIS — Z8659 Personal history of other mental and behavioral disorders: Secondary | ICD-10-CM | POA: Insufficient documentation

## 2014-05-08 DIAGNOSIS — G8929 Other chronic pain: Secondary | ICD-10-CM | POA: Diagnosis not present

## 2014-05-08 DIAGNOSIS — Z720 Tobacco use: Secondary | ICD-10-CM | POA: Insufficient documentation

## 2014-05-08 DIAGNOSIS — R519 Headache, unspecified: Secondary | ICD-10-CM

## 2014-05-08 DIAGNOSIS — Z87448 Personal history of other diseases of urinary system: Secondary | ICD-10-CM | POA: Insufficient documentation

## 2014-05-08 DIAGNOSIS — Z79899 Other long term (current) drug therapy: Secondary | ICD-10-CM | POA: Diagnosis not present

## 2014-05-08 DIAGNOSIS — M542 Cervicalgia: Secondary | ICD-10-CM | POA: Insufficient documentation

## 2014-05-08 DIAGNOSIS — Z8742 Personal history of other diseases of the female genital tract: Secondary | ICD-10-CM | POA: Insufficient documentation

## 2014-05-08 DIAGNOSIS — Z8639 Personal history of other endocrine, nutritional and metabolic disease: Secondary | ICD-10-CM | POA: Diagnosis not present

## 2014-05-08 DIAGNOSIS — R51 Headache: Secondary | ICD-10-CM | POA: Insufficient documentation

## 2014-05-08 MED ORDER — HYDROMORPHONE HCL 1 MG/ML IJ SOLN
1.0000 mg | Freq: Once | INTRAMUSCULAR | Status: AC
  Administered 2014-05-08: 1 mg via INTRAVENOUS
  Filled 2014-05-08: qty 1

## 2014-05-08 MED ORDER — METOCLOPRAMIDE HCL 5 MG/ML IJ SOLN
10.0000 mg | Freq: Once | INTRAMUSCULAR | Status: AC
  Administered 2014-05-08: 10 mg via INTRAVENOUS
  Filled 2014-05-08: qty 2

## 2014-05-08 MED ORDER — DIPHENHYDRAMINE HCL 50 MG/ML IJ SOLN
25.0000 mg | Freq: Once | INTRAMUSCULAR | Status: AC
  Administered 2014-05-08: 25 mg via INTRAVENOUS
  Filled 2014-05-08: qty 1

## 2014-05-08 MED ORDER — SODIUM CHLORIDE 0.9 % IV SOLN
Freq: Once | INTRAVENOUS | Status: AC
  Administered 2014-05-08: 18:00:00 via INTRAVENOUS

## 2014-05-08 MED ORDER — SUMATRIPTAN SUCCINATE 100 MG PO TABS: 100.0000 mg | ORAL_TABLET | ORAL | Status: DC | PRN

## 2014-05-08 NOTE — ED Notes (Signed)
C/o HA x 2-3 days

## 2014-05-08 NOTE — Discharge Instructions (Signed)

## 2014-05-08 NOTE — ED Notes (Signed)
Pt complains of frontal headache. Reports severe pain when she moves her eyes and it shoots pain up the back of her neck.  Pt denies vision change, weakness or n/v.

## 2014-05-08 NOTE — ED Provider Notes (Signed)
CSN: [id number]     Arrival date & time 05/08/14  1734 History   First MD Initiated Contact with Patient 05/08/14 1749     Chief Complaint  Patient presents with  . Headache     (Consider location/radiation/quality/duration/timing/severity/associated sxs/prior Treatment) Patient is a 28 y.o. female presenting with headaches. The history is provided by the patient. No language interpreter was used.  Headache Pain location:  Frontal Quality:  Stabbing Radiates to:  Does not radiate Onset quality:  Gradual Duration:  2 days Timing:  Constant Progression:  Worsening Chronicity:  New Similar to prior headaches: yes   Relieved by:  Nothing Worsened by:  Nothing tried Ineffective treatments:  NSAIDs Associated symptoms: neck pain   Associated symptoms: no abdominal pain, no back pain, no fever, no focal weakness, no photophobia, no seizures and no sore throat   Risk factors: no anger, does not have insomnia and lifestyle not sedentary   Pt reports she took  hydrocodone, ibuprofen aleve and torodol   Past Medical History  Diagnosis Date  . Gastric reflux   . Anxiety   . Basedow's disease   . Pyelonephritis   . History of nephrolithiasis   . Anxiety disorder   . Bipolar disorder   . Interstitial cystitis   . Chronic back pain   . Fibroid    Past Surgical History  Procedure Laterality Date  . Tonsillectomy    . Adenoidectomy    . Tear duct probing    . Cholecystectomy N/A 01/05/2014    Procedure: LAPAROSCOPIC CHOLECYSTECTOMY WITH INTRAOPERATIVE CHOLANGIOGRAM;  Surgeon: Edward Jolly, MD;  Location: [facility];  Service: General;  Laterality: N/A;   Family History  Problem Relation Age of Onset  . Diabetes    . Hypertension    . Lung cancer    . Bipolar disorder Sister    History  Substance Use Topics  . Smoking status: Current Some Day Smoker -- 0.25 packs/day for 8 years    Types: Cigarettes  . Smokeless tobacco: Never Used  . Alcohol Use: No   OB History    Gravida Para Term Preterm AB TAB SAB Ectopic Multiple Living   1 1 1       1      Review of Systems  Constitutional: Negative for fever.  HENT: Negative for sore throat.   Eyes: Negative for photophobia.  Gastrointestinal: Negative for abdominal pain.  Musculoskeletal: Positive for neck pain. Negative for back pain.  Neurological: Positive for headaches. Negative for focal weakness and seizures.  All other systems reviewed and are negative.     Allergies  Review of patient's allergies indicates no known allergies.  Home Medications   Prior to Admission medications   Medication Sig Start Date End Date Taking? Authorizing Provider  amphetamine-dextroamphetamine (ADDERALL) 30 MG tablet Take 1 tablet by mouth daily. 05/02/14   Andrews, PA-C  HYDROcodone-homatropine (HYCODAN) 5-1.5 MG/5ML syrup Take 5 mLs by mouth every 6 (six) hours as needed for cough. 03/27/14   Colon Branch, MD  ibuprofen (ADVIL,MOTRIN) 600 MG tablet Take 600 mg by mouth every 6 (six) hours as needed for moderate pain. 01/01/14   Antonietta Breach, PA-C  levonorgestrel-ethinyl estradiol (AVIANE,ALESSE,LESSINA) 0.1-20 MG-MCG tablet Take 1 tablet by mouth daily.    Historical Provider, MD   BP 137/90 mmHg  Pulse 100  Temp(Src) 98.7 F (37.1 C) (Oral)  Resp 18  Ht 5\' 5"  (1.651 m)  Wt 160 lb (72.576 kg)  BMI 26.63 kg/m2  SpO2 100%  LMP 05/04/2014 Physical Exam  Constitutional: She is oriented to person, place, and time. She appears well-developed and well-nourished.  HENT:  Head: Normocephalic and atraumatic.  Right Ear: External ear normal.  Left Ear: External ear normal.  Mouth/Throat: Oropharynx is clear and moist.  Eyes: Conjunctivae and EOM are normal. Pupils are equal, round, and reactive to light.  Neck: Normal range of motion.  Cardiovascular: Normal rate and normal heart sounds.   Pulmonary/Chest: Effort normal and breath sounds normal.  Abdominal: She exhibits no distension.  Musculoskeletal:  Normal range of motion.  Neurological: She is alert and oriented to person, place, and time.  Skin: Skin is warm.  Psychiatric: She has a normal mood and affect.  Nursing note and vitals reviewed.   ED Course  Procedures (including critical care time) Labs Review Labs Reviewed - No data to display  Imaging Review Ct Head Wo Contrast  05/08/2014   CLINICAL DATA:  Right-sided headache for 3 days  EXAM: CT HEAD WITHOUT CONTRAST  TECHNIQUE: Contiguous axial images were obtained from the base of the skull through the vertex without intravenous contrast.  COMPARISON:  February 16, 2011  FINDINGS: The ventricles are normal in size and configuration. There is no mass, hemorrhage, extra-axial fluid collection, or midline shift. Gray-white compartments are normal. There is no demonstrable acute infarct. Bony calvarium appears intact. The mastoid air cells are clear.  IMPRESSION: Study within normal limits. No evidence of intracranial mass, hemorrhage, or acute appearing infarct/focal gray-white compartment lesion.   Electronically Signed   By: Lowella Grip M.D.   On: 05/08/2014 21:17     EKG Interpretation None      MDM no relief from migraine cocktail iv.   Pt given dilaudid 1 mg iv.   Pt sleeping after medication.  Pt reports some relief from headache.     Final diagnoses:  None    See your primary care Md for recheck next week.   imitrex     Fransico Meadow, PA-C 05/08/14 [place], MD 05/08/14 [phone]

## 2014-05-13 ENCOUNTER — Emergency Department (HOSPITAL_COMMUNITY): Payer: 59

## 2014-05-13 ENCOUNTER — Emergency Department (HOSPITAL_COMMUNITY)
Admission: EM | Admit: 2014-05-13 | Discharge: 2014-05-13 | Disposition: A | Discharge status: Home / Self Care | Payer: 59 | Attending: Emergency Medicine | Admitting: Emergency Medicine

## 2014-05-13 ENCOUNTER — Encounter (HOSPITAL_COMMUNITY): Payer: Self-pay | Admitting: Emergency Medicine

## 2014-05-13 DIAGNOSIS — S134XXA Sprain of ligaments of cervical spine, initial encounter: Secondary | ICD-10-CM | POA: Insufficient documentation

## 2014-05-13 DIAGNOSIS — Z8742 Personal history of other diseases of the female genital tract: Secondary | ICD-10-CM | POA: Insufficient documentation

## 2014-05-13 DIAGNOSIS — Z8639 Personal history of other endocrine, nutritional and metabolic disease: Secondary | ICD-10-CM | POA: Insufficient documentation

## 2014-05-13 DIAGNOSIS — Z8659 Personal history of other mental and behavioral disorders: Secondary | ICD-10-CM | POA: Diagnosis not present

## 2014-05-13 DIAGNOSIS — Z87442 Personal history of urinary calculi: Secondary | ICD-10-CM | POA: Diagnosis not present

## 2014-05-13 DIAGNOSIS — G8929 Other chronic pain: Secondary | ICD-10-CM | POA: Diagnosis not present

## 2014-05-13 DIAGNOSIS — Y998 Other external cause status: Secondary | ICD-10-CM | POA: Diagnosis not present

## 2014-05-13 DIAGNOSIS — Z720 Tobacco use: Secondary | ICD-10-CM | POA: Diagnosis not present

## 2014-05-13 DIAGNOSIS — Z8719 Personal history of other diseases of the digestive system: Secondary | ICD-10-CM | POA: Diagnosis not present

## 2014-05-13 DIAGNOSIS — Y92410 Unspecified street and highway as the place of occurrence of the external cause: Secondary | ICD-10-CM | POA: Insufficient documentation

## 2014-05-13 DIAGNOSIS — Y9389 Activity, other specified: Secondary | ICD-10-CM | POA: Diagnosis not present

## 2014-05-13 DIAGNOSIS — M436 Torticollis: Secondary | ICD-10-CM | POA: Diagnosis present

## 2014-05-13 DIAGNOSIS — S139XXA Sprain of joints and ligaments of unspecified parts of neck, initial encounter: Secondary | ICD-10-CM

## 2014-05-13 MED ORDER — IBUPROFEN 600 MG PO TABS: 600.0000 mg | ORAL_TABLET | Freq: Four times a day (QID) | ORAL | Status: DC | PRN

## 2014-05-13 MED ORDER — CYCLOBENZAPRINE HCL 10 MG PO TABS: 10.0000 mg | ORAL_TABLET | Freq: Two times a day (BID) | ORAL | Status: DC | PRN

## 2014-05-13 MED ORDER — HYDROCODONE-ACETAMINOPHEN 5-325 MG PO TABS: 1.0000 | ORAL_TABLET | ORAL | Status: DC | PRN

## 2014-05-13 MED ORDER — IBUPROFEN 800 MG PO TABS
800.0000 mg | ORAL_TABLET | Freq: Once | ORAL | Status: AC
  Administered 2014-05-13: 800 mg via ORAL
  Filled 2014-05-13: qty 1
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
## 2014-05-13 NOTE — Discharge Instructions (Signed)
Cervical Sprain °A cervical sprain is an injury in the neck in which the strong, fibrous tissues (ligaments) that connect your neck bones stretch or tear. Cervical sprains can range from mild to severe. Severe cervical sprains can cause the neck vertebrae to be unstable. This can lead to damage of the spinal cord and can result in serious nervous system problems. The amount of time it takes for a cervical sprain to get better depends on the cause and extent of the injury. Most cervical sprains heal in 1 to 3 weeks. °CAUSES  °Severe cervical sprains may be caused by:  °· Contact sport injuries (such as from football, rugby, wrestling, hockey, auto racing, gymnastics, diving, martial arts, or boxing).   °· Motor vehicle collisions.   °· Whiplash injuries. This is an injury from a sudden forward and backward whipping movement of the head and neck.  °· Falls.   °Mild cervical sprains may be caused by:  °· Being in an awkward position, such as while cradling a telephone between your ear and shoulder.   °· Sitting in a chair that does not offer proper support.   °· Working at a poorly designed computer station.   °· Looking up or down for long periods of time.   °SYMPTOMS  °· Pain, soreness, stiffness, or a burning sensation in the front, back, or sides of the neck. This discomfort may develop immediately after the injury or slowly, 24 hours or more after the injury.   °· Pain or tenderness directly in the middle of the back of the neck.   °· Shoulder or upper back pain.   °· Limited ability to move the neck.   °· Headache.   °· Dizziness.   °· Weakness, numbness, or tingling in the hands or arms.   °· Muscle spasms.   °· Difficulty swallowing or chewing.   °· Tenderness and swelling of the neck.   °DIAGNOSIS  °Most of the time your health care provider can diagnose a cervical sprain by taking your history and doing a physical exam. Your health care provider will ask about previous neck injuries and any known neck  problems, such as arthritis in the neck. X-rays may be taken to find out if there are any other problems, such as with the bones of the neck. Other tests, such as a CT scan or MRI, may also be needed.  °TREATMENT  °Treatment depends on the severity of the cervical sprain. Mild sprains can be treated with rest, keeping the neck in place (immobilization), and pain medicines. Severe cervical sprains are immediately immobilized. Further treatment is done to help with pain, muscle spasms, and other symptoms and may include: °· Medicines, such as pain relievers, numbing medicines, or muscle relaxants.   °· Physical therapy. This may involve stretching exercises, strengthening exercises, and posture training. Exercises and improved posture can help stabilize the neck, strengthen muscles, and help stop symptoms from returning.   °HOME CARE INSTRUCTIONS  °· Put ice on the injured area.   °¨ Put ice in a plastic bag.   °¨ Place a towel between your skin and the bag.   °¨ Leave the ice on for 15-20 minutes, 3-4 times a day.   °· If your injury was severe, you may have been given a cervical collar to wear. A cervical collar is a two-piece collar designed to keep your neck from moving while it heals. °¨ Do not remove the collar unless instructed by your health care provider. °¨ If you have long hair, keep it outside of the collar. °¨ Ask your health care provider before making any adjustments to your collar. Minor   adjustments may be required over time to improve comfort and reduce pressure on your chin or on the back of your head. °¨ If you are allowed to remove the collar for cleaning or bathing, follow your health care provider's instructions on how to do so safely. °¨ Keep your collar clean by wiping it with mild soap and water and drying it completely. If the collar you have been given includes removable pads, remove them every 1-2 days and hand wash them with soap and water. Allow them to air dry. They should be completely  dry before you wear them in the collar. °¨ If you are allowed to remove the collar for cleaning and bathing, wash and dry the skin of your neck. Check your skin for irritation or sores. If you see any, tell your health care provider. °¨ Do not drive while wearing the collar.   °· Only take over-the-counter or prescription medicines for pain, discomfort, or fever as directed by your health care provider.   °· Keep all follow-up appointments as directed by your health care provider.   °· Keep all physical therapy appointments as directed by your health care provider.   °· Make any needed adjustments to your workstation to promote good posture.   °· Avoid positions and activities that make your symptoms worse.   °· Warm up and stretch before being active to help prevent problems.   °SEEK MEDICAL CARE IF:  °· Your pain is not controlled with medicine.   °· You are unable to decrease your pain medicine over time as planned.   °· Your activity level is not improving as expected.   °SEEK IMMEDIATE MEDICAL CARE IF:  °· You develop any bleeding. °· You develop stomach upset. °· You have signs of an allergic reaction to your medicine.   °· Your symptoms get worse.   °· You develop new, unexplained symptoms.   °· You have numbness, tingling, weakness, or paralysis in any part of your body.   °MAKE SURE YOU:  °· Understand these instructions. °· Will watch your condition. °· Will get help right away if you are not doing well or get worse. °Document Released: 03/28/2007 Document Revised: 06/05/2013 Document Reviewed: 12/06/2012 °Exit[employer]® Patient Information ©2015 [employer]. This information is not intended to replace advice given to you by your health care provider. Make sure you discuss any questions you have with your health care provider. ° °Motor Vehicle Collision °It is common to have multiple bruises and sore muscles after a motor vehicle collision (MVC). These tend to feel worse for the first 24 hours. You may have  the most stiffness and soreness over the first several hours. You may also feel worse when you wake up the first morning after your collision. After this point, you will usually begin to improve with each day. The speed of improvement often depends on the severity of the collision, the number of injuries, and the location and nature of these injuries. °HOME CARE INSTRUCTIONS °· Put ice on the injured area. °¨ Put ice in a plastic bag. °¨ Place a towel between your skin and the bag. °¨ Leave the ice on for 15-20 minutes, 3-4 times a day, or as directed by your health care provider. °· Drink enough fluids to keep your urine clear or pale yellow. Do not drink alcohol. °· Take a warm shower or bath once or twice a day. This will increase blood flow to sore muscles. °· You may return to activities as directed by your caregiver. Be careful when lifting, as this may aggravate neck or back   pain. °· Only take over-the-counter or prescription medicines for pain, discomfort, or fever as directed by your caregiver. Do not use aspirin. This may increase bruising and bleeding. °SEEK IMMEDIATE MEDICAL CARE IF: °· You have numbness, tingling, or weakness in the arms or legs. °· You develop severe headaches not relieved with medicine. °· You have severe neck pain, especially tenderness in the middle of the back of your neck. °· You have changes in bowel or bladder control. °· There is increasing pain in any area of the body. °· You have shortness of breath, light-headedness, dizziness, or fainting. °· You have chest pain. °· You feel sick to your stomach (nauseous), throw up (vomit), or sweat. °· You have increasing abdominal discomfort. °· There is blood in your urine, stool, or vomit. °· You have pain in your shoulder (shoulder strap areas). °· You feel your symptoms are getting worse. °MAKE SURE YOU: °· Understand these instructions. °· Will watch your condition. °· Will get help right away if you are not doing well or get  worse. °Document Released: 05/31/2005 Document Revised: 10/15/2013 Document Reviewed: 10/28/2010 °ExitCare® Patient Information ©2015 [employer]. This information is not intended to replace advice given to you by your health care provider. Make sure you discuss any questions you have with your health care provider. ° °
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
## 2014-05-16 ENCOUNTER — Emergency Department (HOSPITAL_COMMUNITY): Payer: 59

## 2014-05-16 ENCOUNTER — Encounter (HOSPITAL_COMMUNITY): Payer: Self-pay | Admitting: Emergency Medicine

## 2014-05-16 ENCOUNTER — Emergency Department (HOSPITAL_COMMUNITY)
Admission: EM | Admit: 2014-05-16 | Discharge: 2014-05-16 | Disposition: A | Discharge status: Home / Self Care | Payer: 59 | Attending: Emergency Medicine | Admitting: Emergency Medicine

## 2014-05-16 DIAGNOSIS — Z79899 Other long term (current) drug therapy: Secondary | ICD-10-CM | POA: Diagnosis not present

## 2014-05-16 DIAGNOSIS — Z720 Tobacco use: Secondary | ICD-10-CM | POA: Insufficient documentation

## 2014-05-16 DIAGNOSIS — Z87442 Personal history of urinary calculi: Secondary | ICD-10-CM | POA: Diagnosis not present

## 2014-05-16 DIAGNOSIS — Y998 Other external cause status: Secondary | ICD-10-CM | POA: Insufficient documentation

## 2014-05-16 DIAGNOSIS — Z8659 Personal history of other mental and behavioral disorders: Secondary | ICD-10-CM | POA: Insufficient documentation

## 2014-05-16 DIAGNOSIS — G8929 Other chronic pain: Secondary | ICD-10-CM | POA: Diagnosis not present

## 2014-05-16 DIAGNOSIS — Z8742 Personal history of other diseases of the female genital tract: Secondary | ICD-10-CM | POA: Diagnosis not present

## 2014-05-16 DIAGNOSIS — M542 Cervicalgia: Secondary | ICD-10-CM

## 2014-05-16 DIAGNOSIS — S29092A Other injury of muscle and tendon of back wall of thorax, initial encounter: Secondary | ICD-10-CM | POA: Diagnosis not present

## 2014-05-16 DIAGNOSIS — Z8639 Personal history of other endocrine, nutritional and metabolic disease: Secondary | ICD-10-CM | POA: Insufficient documentation

## 2014-05-16 DIAGNOSIS — M545 Low back pain, unspecified: Secondary | ICD-10-CM

## 2014-05-16 DIAGNOSIS — Z8719 Personal history of other diseases of the digestive system: Secondary | ICD-10-CM | POA: Diagnosis not present

## 2014-05-16 DIAGNOSIS — S3992XA Unspecified injury of lower back, initial encounter: Secondary | ICD-10-CM | POA: Diagnosis not present

## 2014-05-16 DIAGNOSIS — S199XXA Unspecified injury of neck, initial encounter: Secondary | ICD-10-CM | POA: Diagnosis not present

## 2014-05-16 DIAGNOSIS — S8991XA Unspecified injury of right lower leg, initial encounter: Secondary | ICD-10-CM | POA: Diagnosis present

## 2014-05-16 DIAGNOSIS — Y9389 Activity, other specified: Secondary | ICD-10-CM | POA: Insufficient documentation

## 2014-05-16 DIAGNOSIS — Y92410 Unspecified street and highway as the place of occurrence of the external cause: Secondary | ICD-10-CM | POA: Diagnosis not present

## 2014-05-16 MED ORDER — PREDNISONE 10 MG PO TABS: ORAL_TABLET | ORAL | Status: DC

## 2014-05-16 MED ORDER — IBUPROFEN 800 MG PO TABS
800.0000 mg | ORAL_TABLET | Freq: Once | ORAL | Status: AC
  Administered 2014-05-16: 800 mg via ORAL
  Filled 2014-05-16: qty 1

## 2014-05-16 NOTE — ED Notes (Signed)
Pt c/o right leg pain radiating from buttocks to knee, also intermittent numbness in r/leg. Pt is alert, oriented and ambulatory. Denies LOC at post [facility]
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
## 2014-05-16 NOTE — Discharge Instructions (Signed)

## 2014-05-21 ENCOUNTER — Encounter: Payer: Self-pay | Admitting: Medical

## 2014-05-21 ENCOUNTER — Ambulatory Visit (INDEPENDENT_AMBULATORY_CARE_PROVIDER_SITE_OTHER): Payer: 59 | Admitting: Medical

## 2014-05-21 ENCOUNTER — Ambulatory Visit (HOSPITAL_BASED_OUTPATIENT_CLINIC_OR_DEPARTMENT_OTHER)
Admission: RE | Admit: 2014-05-21 | Discharge: 2014-05-21 | Disposition: A | Discharge status: Home / Self Care | Payer: 59 | Source: Ambulatory Visit | Attending: Medical | Admitting: Medical

## 2014-05-21 VITALS — BP 116/83 | HR 94 | Temp 98.3°F | Ht 65.5 in | Wt 162.2 lb

## 2014-05-21 DIAGNOSIS — M546 Pain in thoracic spine: Secondary | ICD-10-CM | POA: Insufficient documentation

## 2014-05-21 DIAGNOSIS — M549 Dorsalgia, unspecified: Secondary | ICD-10-CM | POA: Insufficient documentation

## 2014-05-21 DIAGNOSIS — M5441 Lumbago with sciatica, right side: Secondary | ICD-10-CM

## 2014-05-21 DIAGNOSIS — M542 Cervicalgia: Secondary | ICD-10-CM | POA: Insufficient documentation

## 2014-05-21 MED ORDER — DICLOFENAC SODIUM 75 MG PO TBEC: 75.0000 mg | DELAYED_RELEASE_TABLET | Freq: Two times a day (BID) | ORAL | Status: DC

## 2014-05-21 MED ORDER — METHOCARBAMOL 500 MG PO TABS: 500.0000 mg | ORAL_TABLET | Freq: Three times a day (TID) | ORAL | Status: DC | PRN

## 2014-05-21 MED ORDER — TRAMADOL HCL 50 MG PO TABS: 50.0000 mg | ORAL_TABLET | Freq: Three times a day (TID) | ORAL | Status: DC | PRN

## 2014-05-21 NOTE — Assessment & Plan Note (Addendum)
Will get xray of thoracic spine today. Same management for lumbar spine.
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
## 2014-05-21 NOTE — Assessment & Plan Note (Signed)
Lumbar xray was negative.  You mentioned you are out of hydrocodone and will prescribe tramadol in the place of. Stop ibuprofen and I am prescribing diclofenac.  We will follow your injuries/level of pain and how it effects daily activities. If you have any persisting radicular pain on follow up would consider getting MRI of lumbar spine. Also would consider getting an MRI.  If you get leg weakness, saddle anesthesia, incontinence, foot drop severe pain then ED evaluation.  Follow up here in 10 days.

## 2014-05-21 NOTE — Assessment & Plan Note (Signed)
  Xray was negative but will follow up and see this pain is resolving.

## 2014-05-21 NOTE — Patient Instructions (Signed)
Your cervical spine xray and lumbar spine xray appeared negative for acute injury. But you have thoracic region pain on exam. I will get thoracic xray today. I want you to stop flexeril and I will prescribe robaxin muscle relaxant in place of. Your mentioned you are out of hydrocodone and will prescribe tramadol in the place of. Stop ibuprofen and I am prescribing diclofenac.  We will follow your injuries/level of pain and how it effects daily activities. If you have any persisting radicular pain on follow up would consider getting MRI of lumbar spine. Also would consider getting an MRI.  If you get leg weakness, saddle anesthesia, incontinence, foot drop severe pain then ED evaluation.  Follow up in 10 days or as needed

## 2014-05-21 NOTE — Progress Notes (Signed)
Pre visit review using our clinic review tool, if applicable. No additional management support is needed unless otherwise documented below in the visit note. 

## 2014-05-31 ENCOUNTER — Encounter: Payer: Self-pay | Admitting: Family Medicine

## 2014-06-04 ENCOUNTER — Encounter: Payer: Self-pay | Admitting: Family Medicine

## 2014-06-04 ENCOUNTER — Ambulatory Visit (INDEPENDENT_AMBULATORY_CARE_PROVIDER_SITE_OTHER): Payer: 59 | Admitting: Family Medicine

## 2014-06-04 VITALS — BP 106/76 | HR 92 | Temp 98.1°F | Wt 158.0 lb

## 2014-06-04 DIAGNOSIS — F909 Attention-deficit hyperactivity disorder, unspecified type: Secondary | ICD-10-CM

## 2014-06-04 DIAGNOSIS — M5441 Lumbago with sciatica, right side: Secondary | ICD-10-CM

## 2014-06-04 DIAGNOSIS — F988 Other specified behavioral and emotional disorders with onset usually occurring in childhood and adolescence: Secondary | ICD-10-CM

## 2014-06-04 MED ORDER — AMPHETAMINE-DEXTROAMPHETAMINE 30 MG PO TABS: 30.0000 mg | ORAL_TABLET | Freq: Every day | ORAL | Status: DC

## 2014-06-04 MED ORDER — METAXALONE 800 MG PO TABS: 800.0000 mg | ORAL_TABLET | Freq: Three times a day (TID) | ORAL | Status: DC

## 2014-06-04 MED ORDER — TRAMADOL HCL 50 MG PO TABS: 100.0000 mg | ORAL_TABLET | Freq: Four times a day (QID) | ORAL | Status: DC | PRN

## 2014-06-04 NOTE — Patient Instructions (Signed)
Back Pain, Adult Low back pain is very common. About 1 in 5 people have back pain.The cause of low back pain is rarely dangerous. The pain often gets better over time.About half of people with a sudden onset of back pain feel better in just 2 weeks. About 8 in 10 people feel better by 6 weeks.  CAUSES Some common causes of back pain include:  Strain of the muscles or ligaments supporting the spine.  Wear and tear (degeneration) of the spinal discs.  Arthritis.  Direct injury to the back. DIAGNOSIS Most of the time, the direct cause of low back pain is not known.However, back pain can be treated effectively even when the exact cause of the pain is unknown.Answering your caregiver's questions about your overall health and symptoms is one of the most accurate ways to make sure the cause of your pain is not dangerous. If your caregiver needs more information, he or she may order lab work or imaging tests (X-rays or MRIs).However, even if imaging tests show changes in your back, this usually does not require surgery. HOME CARE INSTRUCTIONS For many people, back pain returns.Since low back pain is rarely dangerous, it is often a condition that people can learn to manageon their own.   Remain active. It is stressful on the back to sit or stand in one place. Do not sit, drive, or stand in one place for more than 30 minutes at a time. Take short walks on level surfaces as soon as pain allows.Try to increase the length of time you walk each day.  Do not stay in bed.Resting more than 1 or 2 days can delay your recovery.  Do not avoid exercise or work.Your body is made to move.It is not dangerous to be active, even though your back may hurt.Your back will likely heal faster if you return to being active before your pain is gone.  Pay attention to your body when you bend and lift. Many people have less discomfortwhen lifting if they bend their knees, keep the load close to their bodies,and  avoid twisting. Often, the most comfortable positions are those that put less stress on your recovering back.  Find a comfortable position to sleep. Use a firm mattress and lie on your side with your knees slightly bent. If you lie on your back, put a pillow under your knees.  Only take over-the-counter or prescription medicines as directed by your caregiver. Over-the-counter medicines to reduce pain and inflammation are often the most helpful.Your caregiver may prescribe muscle relaxant drugs.These medicines help dull your pain so you can more quickly return to your normal activities and healthy exercise.  Put ice on the injured area.  Put ice in a plastic bag.  Place a towel between your skin and the bag.  Leave the ice on for 15-20 minutes, 03-04 times a day for the first 2 to 3 days. After that, ice and heat may be alternated to reduce pain and spasms.  Ask your caregiver about trying back exercises and gentle massage. This may be of some benefit.  Avoid feeling anxious or stressed.Stress increases muscle tension and can worsen back pain.It is important to recognize when you are anxious or stressed and learn ways to manage it.Exercise is a great option. SEEK MEDICAL CARE IF:  You have pain that is not relieved with rest or medicine.  You have pain that does not improve in 1 week.  You have new symptoms.  You are generally not feeling well. SEEK   IMMEDIATE MEDICAL CARE IF:   You have pain that radiates from your back into your legs.  You develop new bowel or bladder control problems.  You have unusual weakness or numbness in your arms or legs.  You develop nausea or vomiting.  You develop abdominal pain.  You feel faint. Document Released: 05/31/2005 Document Revised: 11/30/2011 Document Reviewed: 10/02/2013 Exit[employer] Patient Information 2015 [employer]. This information is not intended to replace advice given to you by your health care provider. Make sure you  discuss any questions you have with your health care provider.  

## 2014-06-04 NOTE — Addendum Note (Signed)
Addended by: [name] on: 06/04/2014 02:14 PM   Modules accepted: Orders
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
## 2014-06-04 NOTE — Progress Notes (Signed)
Pre visit review using our clinic review tool, if applicable. No additional management support is needed unless otherwise documented below in the visit note. 

## 2014-06-15 ENCOUNTER — Ambulatory Visit (HOSPITAL_BASED_OUTPATIENT_CLINIC_OR_DEPARTMENT_OTHER): Payer: 59

## 2014-06-16 ENCOUNTER — Emergency Department (HOSPITAL_BASED_OUTPATIENT_CLINIC_OR_DEPARTMENT_OTHER): Payer: 59

## 2014-06-16 ENCOUNTER — Encounter (HOSPITAL_BASED_OUTPATIENT_CLINIC_OR_DEPARTMENT_OTHER): Payer: Self-pay | Admitting: *Deleted

## 2014-06-16 ENCOUNTER — Inpatient Hospital Stay (HOSPITAL_BASED_OUTPATIENT_CLINIC_OR_DEPARTMENT_OTHER)
Admission: EM | Admit: 2014-06-16 | Discharge: 2014-06-19 | DRG: 392 | Disposition: A | Discharge status: Home / Self Care | Payer: 59 | Attending: Internal Medicine | Admitting: Internal Medicine

## 2014-06-16 DIAGNOSIS — F17210 Nicotine dependence, cigarettes, uncomplicated: Secondary | ICD-10-CM | POA: Diagnosis present

## 2014-06-16 DIAGNOSIS — K219 Gastro-esophageal reflux disease without esophagitis: Secondary | ICD-10-CM | POA: Diagnosis present

## 2014-06-16 DIAGNOSIS — R1110 Vomiting, unspecified: Secondary | ICD-10-CM

## 2014-06-16 DIAGNOSIS — Z79899 Other long term (current) drug therapy: Secondary | ICD-10-CM | POA: Diagnosis not present

## 2014-06-16 DIAGNOSIS — Z87442 Personal history of urinary calculi: Secondary | ICD-10-CM | POA: Diagnosis present

## 2014-06-16 DIAGNOSIS — F988 Other specified behavioral and emotional disorders with onset usually occurring in childhood and adolescence: Secondary | ICD-10-CM | POA: Diagnosis present

## 2014-06-16 DIAGNOSIS — R9389 Abnormal findings on diagnostic imaging of other specified body structures: Secondary | ICD-10-CM

## 2014-06-16 DIAGNOSIS — M549 Dorsalgia, unspecified: Secondary | ICD-10-CM | POA: Diagnosis present

## 2014-06-16 DIAGNOSIS — R109 Unspecified abdominal pain: Principal | ICD-10-CM

## 2014-06-16 DIAGNOSIS — F319 Bipolar disorder, unspecified: Secondary | ICD-10-CM | POA: Diagnosis present

## 2014-06-16 DIAGNOSIS — M5441 Lumbago with sciatica, right side: Secondary | ICD-10-CM

## 2014-06-16 DIAGNOSIS — Z720 Tobacco use: Secondary | ICD-10-CM | POA: Diagnosis present

## 2014-06-16 LAB — BASIC METABOLIC PANEL
Anion gap: 7 (ref 5–15)
BUN: 7 mg/dL (ref 6–23)
CO2: 26 mmol/L (ref 19–32)
Calcium: 9.1 mg/dL (ref 8.4–10.5)
Chloride: 104 mEq/L (ref 96–112)
Creatinine, Ser: 0.73 mg/dL (ref 0.50–1.10)
GFR calc Af Amer: >90 mL/min (ref >90)
GLUCOSE: 100 mg/dL — AB (ref 70–99)
POTASSIUM: 3.8 mmol/L (ref 3.5–5.1)
Sodium: 137 mmol/L (ref 135–145)

## 2014-06-16 LAB — URINALYSIS, ROUTINE W REFLEX MICROSCOPIC
GLUCOSE, UA: NEGATIVE mg/dL
KETONES UR: 15 mg/dL — AB
NITRITE: NEGATIVE
Protein, ur: NEGATIVE mg/dL
Specific Gravity, Urine: 1.026 (ref 1.005–1.030)
Urobilinogen, UA: 0.2 mg/dL (ref 0.0–1.0)
pH: 5.5 (ref 5.0–8.0)

## 2014-06-16 LAB — HEPATIC FUNCTION PANEL
ALT: 45 U/L — AB (ref 0–35)
AST: 26 U/L (ref 0–37)
Albumin: 4.2 g/dL (ref 3.5–5.2)
Alkaline Phosphatase: 54 U/L (ref 39–117)
Total Bilirubin: 0.3 mg/dL (ref 0.3–1.2)
Total Protein: 7.4 g/dL (ref 6.0–8.3)

## 2014-06-16 LAB — CBC WITH DIFFERENTIAL/PLATELET
Basophils Absolute: 0 10*3/uL (ref 0.0–0.1)
Basophils Relative: 0 % (ref 0–1)
EOS PCT: 2 % (ref 0–5)
Eosinophils Absolute: 0.2 10*3/uL (ref 0.0–0.7)
HCT: 44 % (ref 36.0–46.0)
HEMOGLOBIN: 14.2 g/dL (ref 12.0–15.0)
LYMPHS ABS: 1.1 10*3/uL (ref 0.7–4.0)
Lymphocytes Relative: 9 % — ABNORMAL LOW (ref 12–46)
MCH: 26.9 pg (ref 26.0–34.0)
MCHC: 32.3 g/dL (ref 30.0–36.0)
MCV: 83.5 fL (ref 78.0–100.0)
MONOS PCT: 6 % (ref 3–12)
Monocytes Absolute: 0.7 10*3/uL (ref 0.1–1.0)
Neutro Abs: 9.2 10*3/uL — ABNORMAL HIGH (ref 1.7–7.7)
Neutrophils Relative %: 83 % — ABNORMAL HIGH (ref 43–77)
Platelets: 175 10*3/uL (ref 150–400)
RBC: 5.27 MIL/uL — ABNORMAL HIGH (ref 3.87–5.11)
RDW: 14 % (ref 11.5–15.5)
WBC: 11.1 10*3/uL — ABNORMAL HIGH (ref 4.0–10.5)

## 2014-06-16 LAB — URINE MICROSCOPIC-ADD ON

## 2014-06-16 LAB — PREGNANCY, URINE: PREG TEST UR: NEGATIVE

## 2014-06-16 MED ORDER — PROMETHAZINE HCL 25 MG/ML IJ SOLN
25.0000 mg | INTRAMUSCULAR | Status: DC | PRN
  Administered 2014-06-16: 25 mg via INTRAVENOUS
  Filled 2014-06-16: qty 1

## 2014-06-16 MED ORDER — SODIUM CHLORIDE 0.9 % IV SOLN
Freq: Once | INTRAVENOUS | Status: AC
  Administered 2014-06-16: 21:00:00 via INTRAVENOUS

## 2014-06-16 MED ORDER — SODIUM CHLORIDE 0.9 % IV SOLN
Freq: Once | INTRAVENOUS | Status: AC
  Administered 2014-06-16: 19:00:00 via INTRAVENOUS

## 2014-06-16 MED ORDER — IOHEXOL 300 MG/ML  SOLN
100.0000 mL | Freq: Once | INTRAMUSCULAR | Status: AC | PRN
  Administered 2014-06-16: 100 mL via INTRAVENOUS

## 2014-06-16 MED ORDER — ONDANSETRON HCL 4 MG/2ML IJ SOLN
4.0000 mg | Freq: Once | INTRAMUSCULAR | Status: AC
  Administered 2014-06-16: 4 mg via INTRAVENOUS
  Filled 2014-06-16: qty 2

## 2014-06-16 MED ORDER — SODIUM CHLORIDE 0.9 % IV BOLUS (SEPSIS)
1000.0000 mL | Freq: Once | INTRAVENOUS | Status: AC
  Administered 2014-06-16: 1000 mL via INTRAVENOUS

## 2014-06-16 MED ORDER — HYDROMORPHONE HCL 1 MG/ML IJ SOLN
1.0000 mg | Freq: Once | INTRAMUSCULAR | Status: AC
  Administered 2014-06-16: 1 mg via INTRAVENOUS
  Filled 2014-06-16: qty 1

## 2014-06-16 MED ORDER — FENTANYL CITRATE 0.05 MG/ML IJ SOLN
100.0000 ug | Freq: Once | INTRAMUSCULAR | Status: AC
  Administered 2014-06-16: 100 ug via INTRAVENOUS
  Filled 2014-06-16: qty 2

## 2014-06-16 NOTE — ED Provider Notes (Signed)
CSN: [id number]     Arrival date & time 06/16/14  1733 History  This chart was scribed for Monica Lanes, MD by Lowella Petties, ED Scribe. The patient was seen in room MH05/MH05. Patient's care was started at 7:06 PM.    Chief Complaint  Patient presents with  . Emesis   The history is provided by the patient. No language interpreter was used.   HPI Comments: [name] is a 29 y.o. female who presents to the Emergency Department complaining of constant, severe, lower, right sided abdominal pain which began this morning and intermittently radiates to her back. She reports associated nausea, vomiting, and diarrhea. She states that she feels dehydrated after vomiting so many times. She states that the pain began after the second time she vomited. She states that she has had her gallbladder removed. She expresses concern that she may have a bladder infection.   Past Medical History  Diagnosis Date  . Gastric reflux   . Anxiety   . Basedow's disease   . Pyelonephritis   . History of nephrolithiasis   . Anxiety disorder   . Bipolar disorder   . Interstitial cystitis   . Chronic back pain   . Fibroid    Past Surgical History  Procedure Laterality Date  . Tonsillectomy    . Adenoidectomy    . Tear duct probing    . Cholecystectomy N/A 01/05/2014    Procedure: LAPAROSCOPIC CHOLECYSTECTOMY WITH INTRAOPERATIVE CHOLANGIOGRAM;  Surgeon: Edward Jolly, MD;  Location: [facility] OR[facility];  Service: General;  Laterality: N/A;  . Esophagogastroduodenoscopy (egd) with propofol N/A 06/18/2014    Procedure: ESOPHAGOGASTRODUODENOSCOPY (EGD) WITH PROPOFOL;  Surgeon: Wonda Horner, MD;  Location: [facility] ENDOSCOPY;  Service: Endoscopy;  Laterality: N/A;   Family History  Problem Relation Age of Onset  . Diabetes    . Hypertension    . Lung cancer    . Bipolar disorder Sister    History  Substance Use Topics  . Smoking status: Current Some Day Smoker -- 0.25 packs/day for 8 years    Types: Cigarettes   . Smokeless tobacco: Never Used  . Alcohol Use: No   OB History    Gravida Para Term Preterm AB TAB SAB Ectopic Multiple Living   1 1 1       1      Review of Systems  Gastrointestinal: Positive for nausea, vomiting, abdominal pain and diarrhea.  All other systems reviewed and are negative.  Allergies  Review of patient's allergies indicates no known allergies.  Home Medications   Prior to Admission medications   Medication Sig Start Date End Date Taking? Authorizing Provider  amphetamine-dextroamphetamine (ADDERALL) 30 MG tablet Take 1 tablet by mouth daily. 06/04/14  Yes Rosalita Chessman, DO  levonorgestrel-ethinyl estradiol (AVIANE,ALESSE,LESSINA) 0.1-20 MG-MCG tablet Take 1 tablet by mouth daily.   Yes Historical Provider, MD  metaxalone (SKELAXIN) 800 MG tablet Take 1 tablet (800 mg total) by mouth 3 (three) times daily. Patient taking differently: Take 800 mg by mouth daily as needed for muscle spasms.  06/04/14  Yes Yvonne R Lowne, DO  ondansetron (ZOFRAN) 4 MG tablet Take 1 tablet (4 mg total) by mouth every 6 (six) hours as needed for nausea or vomiting. 06/19/14   Shanker Kristeen Mans, MD  oxyCODONE (OXY IR/ROXICODONE) 5 MG immediate release tablet Take 1 tablet (5 mg total) by mouth every 6 (six) hours as needed for moderate pain. 06/19/14   Shanker Kristeen Mans, MD  pantoprazole (  PROTONIX) 40 MG tablet Take 1 tablet (40 mg total) by mouth daily at 12 noon. 06/19/14   Shanker Kristeen Mans, MD   Triage Vitals: BP 122/80 mmHg  Pulse 115  Temp(Src) 97.8 F (36.6 C) (Oral)  Resp 20  SpO2 100%  LMP 05/07/2014 Physical Exam Physical Exam  Nursing note and vitals reviewed. Constitutional: She is oriented to person, place, and time. She appears well-developed and well-nourished.  Appears uncomfortable.Marland Kitchen  HENT:  Head: Normocephalic and atraumatic.  Eyes: Pupils are equal, round, and reactive to light.  Neck: Normal range of motion.  Cardiovascular: Normal rate and intact distal pulses.    Pulmonary/Chest: No respiratory distress.  breath sounds equal with no wheezes rales or rhonchi. Abdominal: Normal appearance. She exhibits no distension.  tenderness noted in the right upper and right lower quadrant.  No rebound or guarding..  Active bowel sounds. Musculoskeletal: Normal range of motion.  Neurological: She is alert and oriented to person, place, and time. No cranial nerve deficit.  Skin: Skin is warm and dry. No rash noted.  Psychiatric: She has a normal mood and affect. Her behavior is normal.   ED Course  Procedures (including critical care time)  Medications  ondansetron (ZOFRAN) injection 4 mg (not administered)  ondansetron (ZOFRAN) injection 4 mg (4 mg Intravenous Given 06/16/14 1855)  0.9 %  sodium chloride infusion ( Intravenous Stopped 06/16/14 1950)  HYDROmorphone (DILAUDID) injection 1 mg (1 mg Intravenous Given 06/16/14 1915)  sodium chloride 0.9 % bolus 1,000 mL (0 mLs Intravenous Stopped 06/16/14 1937)  sodium chloride 0.9 % bolus 1,000 mL (0 mLs Intravenous Stopped 06/16/14 2027)  fentaNYL (SUBLIMAZE) injection 100 mcg (100 mcg Intravenous Given 06/16/14 2111)  0.9 %  sodium chloride infusion ( Intravenous Stopped 06/16/14 2136)  iohexol (OMNIPAQUE) 300 MG/ML solution 100 mL (100 mLs Intravenous Contrast Given 06/16/14 2129)  0.9 %  sodium chloride infusion ( Intravenous New Bag/Given 06/17/14 0408)  sodium chloride 0.9 % bolus 1,000 mL (1,000 mLs Intravenous Given 06/17/14 0330)  promethazine (PHENERGAN) injection 12.5 mg (12.5 mg Intravenous Given 06/19/14 0037)    DIAGNOSTIC STUDIES: Oxygen Saturation is 100% on room air, normal by my interpretation.    COORDINATION OF CARE: 7:09 PM-Discussed treatment plan which includes lab work and pain medication with pt at bedside and pt agreed to plan.   Labs Review Labs Reviewed  URINALYSIS, ROUTINE W REFLEX MICROSCOPIC - Abnormal; Notable for the following:    Color, Urine AMBER (*)    APPearance TURBID (*)    Hgb urine  dipstick SMALL (*)    Bilirubin Urine SMALL (*)    Ketones, ur 15 (*)    Leukocytes, UA MODERATE (*)    All other components within normal limits  CBC WITH DIFFERENTIAL - Abnormal; Notable for the following:    WBC 11.1 (*)    RBC 5.27 (*)    Neutrophils Relative % 83 (*)    Neutro Abs 9.2 (*)    Lymphocytes Relative 9 (*)    All other components within normal limits  BASIC METABOLIC PANEL - Abnormal; Notable for the following:    Glucose, Bld 100 (*)    All other components within normal limits  URINE MICROSCOPIC-ADD ON - Abnormal; Notable for the following:    Squamous Epithelial / LPF MANY (*)    Bacteria, UA MANY (*)    All other components within normal limits  HEPATIC FUNCTION PANEL - Abnormal; Notable for the following:    ALT 45 (*)  All other components within normal limits  URINALYSIS, ROUTINE W REFLEX MICROSCOPIC - Abnormal; Notable for the following:    Ketones, ur 15 (*)    All other components within normal limits  COMPREHENSIVE METABOLIC PANEL - Abnormal; Notable for the following:    Chloride 115 (*)    BUN <5 (*)    Calcium 7.5 (*)    Total Protein 4.9 (*)    Albumin 2.8 (*)    Alkaline Phosphatase 36 (*)    All other components within normal limits  CBC - Abnormal; Notable for the following:    Hemoglobin 11.2 (*)    HCT 35.1 (*)    Platelets 138 (*)    All other components within normal limits  GLUCOSE, CAPILLARY - Abnormal; Notable for the following:    Glucose-Capillary 101 (*)    All other components within normal limits  GLUCOSE, CAPILLARY - Abnormal; Notable for the following:    Glucose-Capillary 115 (*)    All other components within normal limits  GLUCOSE, CAPILLARY - Abnormal; Notable for the following:    Glucose-Capillary 105 (*)    All other components within normal limits  URINE CULTURE  GC/CHLAMYDIA PROBE AMP  CULTURE, BLOOD (ROUTINE X 2)  CULTURE, BLOOD (ROUTINE X 2)  PREGNANCY, URINE  PROTIME-INR  APTT  LIPASE, BLOOD  LACTIC  ACID, PLASMA  HIV ANTIBODY (ROUTINE TESTING)  GLUCOSE, CAPILLARY  SURGICAL PATHOLOGY    Imaging Review  CLINICAL DATA: Acute onset severe right lower quadrant abdominal pain earlier today, intermittently radiating to the back. This is associated with nausea, vomiting and diarrhea. Prior history of pyelonephritis, nephrolithiasis and interstitial cystitis. Surgical history includes cholecystectomy.  EXAM: CT ABDOMEN AND PELVIS WITH CONTRAST  TECHNIQUE: Multidetector CT imaging of the abdomen and pelvis was performed using the standard protocol following bolus administration of intravenous contrast.  CONTRAST: 100 ml Omnipaque 300 IV. Oral contrast was not administered at the request of the emergency department physician.  COMPARISON: Numerous prior CT abdomen and pelvis examinations dating back to 05/31/2008, most recently 12/19/2013.  FINDINGS: Interval development of mild intra and extrahepatic biliary ductal dilation since the 12/2013 CT, the common bile duct measuring up to 10 mm diameter. The duct can be followed to the ampulla, where there is her soft tissue which protrudes into the duodenum. No evidence of bile duct stone. Gallbladder surgically absent.  Liver otherwise normal in appearance. Normal appearing spleen, pancreas, adrenal glands, and kidneys. No visible aortoiliofemoral atherosclerosis. Patent visceral arteries. No significant lymphadenopathy.  Stomach decompressed and normal in appearance. Normal appearing small bowel. Liquid stool throughout normal appearing colon. Normal appendix in the right upper pelvis. No ascites.  Normal-appearing uterus and ovaries. Urinary bladder decompressed and unremarkable. No free pelvic fluid.  Bone window images unremarkable. Visualized lung bases clear apart from minimal expected dependent atelectasis posteriorly in the right lower lobe. Heart size normal.  IMPRESSION: 1. Interval development of mild intra and  extrahepatic biliary ductal dilation since the most recent prior CT in July, 2015. The duct can be followed to the ampulla, and there is soft tissue at the ampulla which protrudes into the duodenum. Upper endoscopy may be helpful in further evaluation to exclude an ampullary tumor or choledochocele. 2. No acute abnormalities otherwise involving the abdomen or pelvis.   Electronically Signed By: Evangeline Dakin M.D. On: 06/16/2014 21:45  Patient transferrred to [facility] for [facility]   Final diagnoses:  Vomiting  Abdominal pain, unspecified abdominal location  Abnormal CT scan  I personally performed the services described in this documentation, which was scribed in my presence. The recorded information has been reviewed and considered.   Monica Lanes, MD 06/23/14 1328

## 2014-06-16 NOTE — ED Notes (Signed)
Patient states she has been vomiting since this morning, thinks she mat have a bladder infection, and she has a sharp pain in her abd that radiates to her back, pt is crying in triage,

## 2014-06-16 NOTE — ED Notes (Signed)
MD at bedside. 

## 2014-06-17 ENCOUNTER — Inpatient Hospital Stay (HOSPITAL_COMMUNITY): Payer: 59

## 2014-06-17 ENCOUNTER — Encounter (HOSPITAL_COMMUNITY): Payer: Self-pay | Admitting: *Deleted

## 2014-06-17 ENCOUNTER — Other Ambulatory Visit: Payer: Self-pay

## 2014-06-17 DIAGNOSIS — F909 Attention-deficit hyperactivity disorder, unspecified type: Secondary | ICD-10-CM

## 2014-06-17 DIAGNOSIS — K210 Gastro-esophageal reflux disease with esophagitis: Secondary | ICD-10-CM

## 2014-06-17 DIAGNOSIS — R1010 Upper abdominal pain, unspecified: Secondary | ICD-10-CM

## 2014-06-17 DIAGNOSIS — R1011 Right upper quadrant pain: Secondary | ICD-10-CM

## 2014-06-17 DIAGNOSIS — R109 Unspecified abdominal pain: Secondary | ICD-10-CM | POA: Diagnosis present

## 2014-06-17 DIAGNOSIS — Z720 Tobacco use: Secondary | ICD-10-CM

## 2014-06-17 LAB — URINALYSIS, ROUTINE W REFLEX MICROSCOPIC
BILIRUBIN URINE: NEGATIVE
Glucose, UA: NEGATIVE mg/dL
Hgb urine dipstick: NEGATIVE
Ketones, ur: 15 mg/dL — AB
LEUKOCYTES UA: NEGATIVE
NITRITE: NEGATIVE
Protein, ur: NEGATIVE mg/dL
SPECIFIC GRAVITY, URINE: 1.01 (ref 1.005–1.030)
UROBILINOGEN UA: 0.2 mg/dL (ref 0.0–1.0)
pH: 5.5 (ref 5.0–8.0)

## 2014-06-17 LAB — CBC
HCT: 35.1 % — ABNORMAL LOW (ref 36.0–46.0)
Hemoglobin: 11.2 g/dL — ABNORMAL LOW (ref 12.0–15.0)
MCH: 27 pg (ref 26.0–34.0)
MCHC: 31.9 g/dL (ref 30.0–36.0)
MCV: 84.6 fL (ref 78.0–100.0)
Platelets: 138 10*3/uL — ABNORMAL LOW (ref 150–400)
RBC: 4.15 MIL/uL (ref 3.87–5.11)
RDW: 14.1 % (ref 11.5–15.5)
WBC: 6.9 10*3/uL (ref 4.0–10.5)

## 2014-06-17 LAB — LIPASE, BLOOD: LIPASE: 21 U/L (ref 11–59)

## 2014-06-17 LAB — LACTIC ACID, PLASMA: Lactic Acid, Venous: 0.6 mmol/L (ref 0.5–2.2)

## 2014-06-17 LAB — COMPREHENSIVE METABOLIC PANEL
ALT: 29 U/L (ref 0–35)
ANION GAP: 7 (ref 5–15)
AST: 17 U/L (ref 0–37)
Albumin: 2.8 g/dL — ABNORMAL LOW (ref 3.5–5.2)
Alkaline Phosphatase: 36 U/L — ABNORMAL LOW (ref 39–117)
CO2: 19 mmol/L (ref 19–32)
Calcium: 7.5 mg/dL — ABNORMAL LOW (ref 8.4–10.5)
Chloride: 115 mEq/L — ABNORMAL HIGH (ref 96–112)
Creatinine, Ser: 0.78 mg/dL (ref 0.50–1.10)
GFR calc non Af Amer: >90 mL/min (ref >90)
Glucose, Bld: 86 mg/dL (ref 70–99)
Potassium: 3.5 mmol/L (ref 3.5–5.1)
SODIUM: 141 mmol/L (ref 135–145)
TOTAL PROTEIN: 4.9 g/dL — AB (ref 6.0–8.3)
Total Bilirubin: 0.6 mg/dL (ref 0.3–1.2)

## 2014-06-17 LAB — PROTIME-INR
INR: 1.11 (ref 0.00–1.49)
PROTHROMBIN TIME: 14.5 s (ref 11.6–15.2)

## 2014-06-17 LAB — APTT: aPTT: 29 seconds (ref 24–37)

## 2014-06-17 LAB — HIV ANTIBODY (ROUTINE TESTING W REFLEX): HIV: NONREACTIVE

## 2014-06-17 MED ORDER — SODIUM CHLORIDE 0.9 % IV SOLN
INTRAVENOUS | Status: AC
  Administered 2014-06-17: 04:00:00 via INTRAVENOUS

## 2014-06-17 MED ORDER — AMPHETAMINE-DEXTROAMPHETAMINE 10 MG PO TABS
30.0000 mg | ORAL_TABLET | Freq: Every day | ORAL | Status: DC
  Administered 2014-06-19: 30 mg via ORAL
  Filled 2014-06-17: qty 3

## 2014-06-17 MED ORDER — PANTOPRAZOLE SODIUM 40 MG PO TBEC
40.0000 mg | DELAYED_RELEASE_TABLET | Freq: Every day | ORAL | Status: DC
  Administered 2014-06-17 – 2014-06-19 (×3): 40 mg via ORAL
  Filled 2014-06-17 (×3): qty 1

## 2014-06-17 MED ORDER — ONDANSETRON HCL 4 MG/2ML IJ SOLN
4.0000 mg | Freq: Three times a day (TID) | INTRAMUSCULAR | Status: DC | PRN
  Administered 2014-06-17 – 2014-06-19 (×7): 4 mg via INTRAVENOUS
  Filled 2014-06-17 (×9): qty 2

## 2014-06-17 MED ORDER — TRAMADOL HCL 50 MG PO TABS
100.0000 mg | ORAL_TABLET | Freq: Four times a day (QID) | ORAL | Status: DC | PRN
  Filled 2014-06-17: qty 2

## 2014-06-17 MED ORDER — PROMETHAZINE HCL 25 MG/ML IJ SOLN
12.5000 mg | Freq: Four times a day (QID) | INTRAMUSCULAR | Status: AC | PRN
  Administered 2014-06-18 – 2014-06-19 (×2): 12.5 mg via INTRAVENOUS
  Filled 2014-06-17 (×2): qty 1

## 2014-06-17 MED ORDER — SODIUM CHLORIDE 0.9 % IV SOLN
INTRAVENOUS | Status: DC
  Administered 2014-06-17: 12:00:00 via INTRAVENOUS

## 2014-06-17 MED ORDER — PIPERACILLIN-TAZOBACTAM 3.375 G IVPB
3.3750 g | Freq: Three times a day (TID) | INTRAVENOUS | Status: DC
  Administered 2014-06-17: 3.375 g via INTRAVENOUS
  Filled 2014-06-17 (×3): qty 50

## 2014-06-17 MED ORDER — AMPHETAMINE-DEXTROAMPHETAMINE 30 MG PO TABS: 30.0000 mg | ORAL_TABLET | Freq: Every day | ORAL | Status: DC

## 2014-06-17 MED ORDER — SODIUM CHLORIDE 0.9 % IV BOLUS (SEPSIS)
1000.0000 mL | Freq: Once | INTRAVENOUS | Status: AC
  Administered 2014-06-17: 1000 mL via INTRAVENOUS

## 2014-06-17 MED ORDER — METAXALONE 800 MG PO TABS
800.0000 mg | ORAL_TABLET | Freq: Three times a day (TID) | ORAL | Status: DC
  Administered 2014-06-17 – 2014-06-19 (×7): 800 mg via ORAL
  Filled 2014-06-17 (×9): qty 1

## 2014-06-17 MED ORDER — NICOTINE 14 MG/24HR TD PT24
14.0000 mg | MEDICATED_PATCH | Freq: Every day | TRANSDERMAL | Status: DC
  Administered 2014-06-17: 14 mg via TRANSDERMAL
  Filled 2014-06-17 (×3): qty 1

## 2014-06-17 MED ORDER — SODIUM CHLORIDE 0.9 % IV SOLN
INTRAVENOUS | Status: DC
  Administered 2014-06-17: 14:00:00 via INTRAVENOUS

## 2014-06-17 MED ORDER — HYDROMORPHONE HCL 1 MG/ML IJ SOLN
1.0000 mg | INTRAMUSCULAR | Status: DC | PRN
Start: 2014-06-17 — End: 2014-06-19
  Administered 2014-06-17 – 2014-06-18 (×9): 1 mg via INTRAVENOUS
  Filled 2014-06-17 (×10): qty 1

## 2014-06-17 MED ORDER — DICLOFENAC SODIUM 75 MG PO TBEC
75.0000 mg | DELAYED_RELEASE_TABLET | Freq: Two times a day (BID) | ORAL | Status: DC
  Administered 2014-06-17: 75 mg via ORAL
  Filled 2014-06-17 (×2): qty 1

## 2014-06-17 MED ORDER — LEVONORGESTREL-ETHINYL ESTRAD 0.1-20 MG-MCG PO TABS
1.0000 | ORAL_TABLET | Freq: Every day | ORAL | Status: DC
  Administered 2014-06-19: 1 via ORAL

## 2014-06-17 MED ORDER — HEPARIN SODIUM (PORCINE) 5000 UNIT/ML IJ SOLN
5000.0000 [IU] | Freq: Three times a day (TID) | INTRAMUSCULAR | Status: DC
  Administered 2014-06-17 – 2014-06-19 (×8): 5000 [IU] via SUBCUTANEOUS
  Filled 2014-06-17 (×9): qty 1

## 2014-06-17 MED ORDER — LEVONORGESTREL-ETHINYL ESTRAD 0.1-20 MG-MCG PO TABS: 1.0000 | ORAL_TABLET | Freq: Every day | ORAL | Status: DC

## 2014-06-17 NOTE — ED Notes (Signed)
I notified nurses Channin and Kaila of BP 93/53.
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
## 2014-06-17 NOTE — H&P (Addendum)
[facility] History and Physical  [name] OQH:[id number] DOB: 23-May-1986 DOA: 06/16/2014  Referring physician: ED physician PCP: Garnet Koyanagi, DO  Specialists:   Chief Complaint: Abdominal pain and nausea, vomiting  HPI: [name] is a 29 y.o. female past medical history of smoking, GERD, ADD, bipolar, back pain, s/p of cholecystectomy, who presents with nausea vomiting and abdominal pain.  Patient reports that her abdominal pain started yesterday morning. It is located in epigastric area, worse on the right upper quadrant. It is 8/10 in severity, intermittent. It  happens every few minutes. It is sharp, nonradiating. Aggravated by movement. It is associated with nausea and vomiting. Patient had 3 watery diarrhea yesterday. No recent antibiotics use. Patient does not have fever or chills.  Patient also reports having increased urinary frequency and burning on urination, no dysuria. Symptoms began after her abdominal pain started, she attributs urinary symptoms to possible dehydration. Patient denies fever, chills, fatigue, headaches, cough, chest pain, SOB, skin rashes or leg swelling.  Work up in the ED demonstrates leukocytosis with WBC 11.1. Slightly elevated ALT at 45. CT abdomen/pelvis showed dilation of biliary duct. Patient is admitted to inpatient for further evaluation and treatment.  Review of Systems: As presented in the history of presenting illness, rest negative.  Where does patient live?  At home Can patient participate in ADLs? Yes  Allergy: No Known Allergies  Past Medical History  Diagnosis Date  . Gastric reflux   . Anxiety   . Basedow's disease   . Pyelonephritis   . History of nephrolithiasis   . Anxiety disorder   . Bipolar disorder   . Interstitial cystitis   . Chronic back pain   . Fibroid     Past Surgical History  Procedure Laterality Date  . Tonsillectomy    . Adenoidectomy    . Tear duct probing    . Cholecystectomy N/A  01/05/2014    Procedure: LAPAROSCOPIC CHOLECYSTECTOMY WITH INTRAOPERATIVE CHOLANGIOGRAM;  Surgeon: Edward Jolly, MD;  Location: [facility];  Service: General;  Laterality: N/A;    Social History:  reports that she has been smoking Cigarettes.  She has a 2 pack-year smoking history. She has never used smokeless tobacco. She reports that she does not drink alcohol or use illicit drugs.  Family History:  Family History  Problem Relation Age of Onset  . Diabetes    . Hypertension    . Lung cancer    . Bipolar disorder Sister      Prior to Admission medications   Medication Sig Start Date End Date Taking? Authorizing Provider  amphetamine-dextroamphetamine (ADDERALL) 30 MG tablet Take 1 tablet by mouth daily. 06/04/14   Rosalita Chessman, DO  amphetamine-dextroamphetamine (ADDERALL) 30 MG tablet Take 1 tablet by mouth daily. 06/04/14   Rosalita Chessman, DO  amphetamine-dextroamphetamine (ADDERALL) 30 MG tablet Take 1 tablet by mouth daily. 06/04/14   Rosalita Chessman, DO  diclofenac (VOLTAREN) 75 MG EC tablet Take 1 tablet (75 mg total) by mouth 2 (two) times daily. 05/21/14   Meriam Sprague Saguier, PA-C  levonorgestrel-ethinyl estradiol (AVIANE,ALESSE,LESSINA) 0.1-20 MG-MCG tablet Take 1 tablet by mouth daily.    Historical Provider, MD  metaxalone (SKELAXIN) 800 MG tablet Take 1 tablet (800 mg total) by mouth 3 (three) times daily. 06/04/14   Rosalita Chessman, DO  traMADol (ULTRAM) 50 MG tablet Take 2 tablets (100 mg total) by mouth every 6 (six) hours as needed for moderate pain. 06/04/14   Alferd Apa  Lowne, DO    Physical Exam: Filed Vitals:   06/17/14 0004 06/17/14 0030 06/17/14 0041 06/17/14 0145  BP: 91/53 93/61 93/53  86/54  Pulse: 80 72 72 70  Temp:   99.1 F (37.3 C) 98 F (36.7 C)  TempSrc:   Oral Oral  Resp:   18   Height:    5\' 5"  (1.651 m)  SpO2: 99% 96% 98% 97%   General: Not in acute distress HEENT:       Eyes: PERRL, EOMI, no scleral icterus       ENT: No discharge from the ears  and nose, no pharynx injection, no tonsillar enlargement.        Neck: No JVD, no bruit, no mass felt. Cardiac: S1/S2, RRR, No murmurs, No gallops or rubs Pulm: Good air movement bilaterally. Clear to auscultation bilaterally. No rales, wheezing, rhonchi or rubs. Abd: Soft, nondistended, severe tenderness over RUQ, no rebound pain, no organomegaly, BS present Ext: No edema bilaterally. 2+DP/PT pulse bilaterally Musculoskeletal: No joint deformities, erythema, or stiffness, ROM full Skin: No rashes.  Neuro: Alert and oriented X3, cranial nerves II-XII grossly intact, muscle strength 5/5 in all extremeties, sensation to light touch intact.  Psych: Patient is not psychotic, no suicidal or hemocidal ideation.  Labs on Admission:  Basic Metabolic Panel:  Recent Labs Lab 06/16/14 1857  NA 137  K 3.8  CL 104  CO2 26  GLUCOSE 100*  BUN 7  CREATININE 0.73  CALCIUM 9.1   Liver Function Tests:  Recent Labs Lab 06/16/14 2331  AST 26  ALT 45*  ALKPHOS 54  BILITOT 0.3  PROT 7.4  ALBUMIN 4.2   No results for input(s): LIPASE, AMYLASE in the last 168 hours. No results for input(s): AMMONIA in the last 168 hours. CBC:  Recent Labs Lab 06/16/14 1857  WBC 11.1*  NEUTROABS 9.2*  HGB 14.2  HCT 44.0  MCV 83.5  PLT 175   Cardiac Enzymes: No results for input(s): CKTOTAL, CKMB, CKMBINDEX, TROPONINI in the last 168 hours.  BNP (last 3 results) No results for input(s): PROBNP in the last 8760 hours. CBG: No results for input(s): GLUCAP in the last 168 hours.  Radiological Exams on Admission: Ct Abdomen Pelvis W Contrast  06/16/2014   CLINICAL DATA:  Acute onset severe right lower quadrant abdominal pain earlier today, intermittently radiating to the back. This is associated with nausea, vomiting and diarrhea. Prior history of pyelonephritis, nephrolithiasis and interstitial cystitis. Surgical history includes cholecystectomy.  EXAM: CT ABDOMEN AND PELVIS WITH CONTRAST  TECHNIQUE:  Multidetector CT imaging of the abdomen and pelvis was performed using the standard protocol following bolus administration of intravenous contrast.  CONTRAST:  100 ml Omnipaque 300 IV. Oral contrast was not administered at the request of the emergency department physician.  COMPARISON:  Numerous prior CT abdomen and pelvis examinations dating back to 05/31/2008, most recently 12/19/2013.  FINDINGS: Interval development of mild intra and extrahepatic biliary ductal dilation since the 12/2013 CT, the common bile duct measuring up to 10 mm diameter. The duct can be followed to the ampulla, where there is her soft tissue which protrudes into the duodenum. No evidence of bile duct stone. Gallbladder surgically absent.  Liver otherwise normal in appearance. Normal appearing spleen, pancreas, adrenal glands, and kidneys. No visible aortoiliofemoral atherosclerosis. Patent visceral arteries. No significant lymphadenopathy.  Stomach decompressed and normal in appearance. Normal appearing small bowel. Liquid stool throughout normal appearing colon. Normal appendix in the right upper pelvis. No ascites.  Normal-appearing uterus and ovaries. Urinary bladder decompressed and unremarkable. No free pelvic fluid.  Bone window images unremarkable. Visualized lung bases clear apart from minimal expected dependent atelectasis posteriorly in the right lower lobe. Heart size normal.  IMPRESSION: 1. Interval development of mild intra and extrahepatic biliary ductal dilation since the most recent prior CT in July, 2015. The duct can be followed to the ampulla, and there is soft tissue at the ampulla which protrudes into the duodenum. Upper endoscopy may be helpful in further evaluation to exclude an ampullary tumor or choledochocele. 2. No acute abnormalities otherwise involving the abdomen or pelvis.   Electronically Signed   By: Evangeline Dakin M.D.   On: 06/16/2014 21:45    EKG: will get one  Assessment/Plan Principal Problem:    Abdominal pain Active Problems:   GERD   NEPHROLITHIASIS, HX OF   Bipolar 1 disorder   ADD (attention deficit disorder)   Back pain   Vomiting   Tobacco abuse  Abdominal Pain: CT-abd/pelvis showed mild intra and extrahepatic biliary ductal dilation. There is soft tissue at the ampulla which protrudes into the duodenum. Patient has history of cholecystectomy. Patient has mild leukocytosis with WBC 11.1, which is likely due to pain-induced distress. Her blood pressure is very soft, currently 86/54, which is likely due to fentanyl use, however sepsis cannot be ruled out completely. I will start antibiotics empirically.  -will admit to med-surg bed. -IV zosyn -NPO -INR/PTT -US-abdomen -Symptomatic treatment: Dilaudid for pain, Zofran for nausea -IVF: 125cc/h -blood culture x 2 -check lipase -called GI in AM, will see today.  ADD and bipolar: Stable, no suicidal or homicidal ideations -Continue home Adderall  Back pain:  -Continue tramadol, metaxalone, diclofenac  Tobacco abuse: Smokes 1.3 pack a day for 70 years -Did counseling about importance of quitting smoking -Nicotine patch  GERD -Protonix  Possible UTI: Patient has symptoms of increased urinary frequency and burning, but no dysuria. Urinalysis is positive for UTI, but this urine collection was a dirty catch with a many squamous cells. -Repeat urinalysis -Gets urine culture -Check GC/Chlamydia probs -Patient is on Zosyn  DVT ppx: SQ Heparin     Code Status: Full code Family Communication: None at bed side.      Disposition Plan: Admit to inpatient   Date of Service 06/17/2014    Ivor Costa [facility] Pager [phone]  If 7PM-7AM, please contact night-coverage [email or web address] Password [email or web address] 06/17/2014, 3:20 AM

## 2014-06-17 NOTE — Consult Note (Signed)
Referring Provider: Dr. Sloan Leiter Primary Care Physician:  Garnet Koyanagi, DO   Reason for Consultation:  Abdominal pain; Nausea and vomiting  HPI: [name] is a 29 y.o. female being seen for a consult due to abdominal pain/N/V in the setting of an abnormal CT that showed a soft tissue area protruding at the ampulla into the duodenum. She reports feeling ok until yesterday when she had the acute onset of profuse vomiting (nonbloody) with nausea and diffuse abdominal pain. Denies any abdominal pain prior to yesterday. Also had watery loose stools starting yesterday. +chills. Denies fevers. Weight stable. Denies jaundice. Gallbladder removed in July 2015 and no retained stone seen on intraoperative cholangiogram. LFTs normal. EUS in 2009 that was done due to a cystic structure seen at the gastrohepatic ligament and the EUS was normal. Her aunt is at the bedside.     Past Medical History  Diagnosis Date  . Gastric reflux   . Anxiety   . Basedow's disease   . Pyelonephritis   . History of nephrolithiasis   . Anxiety disorder   . Bipolar disorder   . Interstitial cystitis   . Chronic back pain   . Fibroid     Past Surgical History  Procedure Laterality Date  . Tonsillectomy    . Adenoidectomy    . Tear duct probing    . Cholecystectomy N/A 01/05/2014    Procedure: LAPAROSCOPIC CHOLECYSTECTOMY WITH INTRAOPERATIVE CHOLANGIOGRAM;  Surgeon: Edward Jolly, MD;  Location: [facility];  Service: General;  Laterality: N/A;    Prior to Admission medications   Medication Sig Start Date End Date Taking? Authorizing Provider  amphetamine-dextroamphetamine (ADDERALL) 30 MG tablet Take 1 tablet by mouth daily. 06/04/14   Rosalita Chessman, DO  amphetamine-dextroamphetamine (ADDERALL) 30 MG tablet Take 1 tablet by mouth daily. 06/04/14   Rosalita Chessman, DO  amphetamine-dextroamphetamine (ADDERALL) 30 MG tablet Take 1 tablet by mouth daily. 06/04/14   Rosalita Chessman, DO  diclofenac (VOLTAREN)  75 MG EC tablet Take 1 tablet (75 mg total) by mouth 2 (two) times daily. 05/21/14   Meriam Sprague Saguier, PA-C  levonorgestrel-ethinyl estradiol (AVIANE,ALESSE,LESSINA) 0.1-20 MG-MCG tablet Take 1 tablet by mouth daily.    Historical Provider, MD  metaxalone (SKELAXIN) 800 MG tablet Take 1 tablet (800 mg total) by mouth 3 (three) times daily. 06/04/14   Rosalita Chessman, DO  traMADol (ULTRAM) 50 MG tablet Take 2 tablets (100 mg total) by mouth every 6 (six) hours as needed for moderate pain. 06/04/14   Rosalita Chessman, DO    Scheduled Meds: . sodium chloride   Intravenous STAT  . amphetamine-dextroamphetamine  30 mg Oral Q breakfast  . heparin  5,000 Units Subcutaneous 3 times per day  . levonorgestrel-ethinyl estradiol  1 tablet Oral Daily  . metaxalone  800 mg Oral TID  . nicotine  14 mg Transdermal Daily  . pantoprazole  40 mg Oral Q1200   Continuous Infusions: . sodium chloride 75 mL/hr at 06/17/14 1200   PRN Meds:.HYDROmorphone (DILAUDID) injection, ondansetron (ZOFRAN) IV, traMADol  Allergies as of 06/16/2014  . (No Known Allergies)    Family History  Problem Relation Age of Onset  . Diabetes    . Hypertension    . Lung cancer    . Bipolar disorder Sister     History   Social History  . Marital Status: Single    Spouse Name: N/A    Number of Children: N/A  . Years of Education: N/A  Occupational History  . driver     [employer]   Social History Main Topics  . Smoking status: Current Some Day Smoker -- 0.25 packs/day for 8 years    Types: Cigarettes  . Smokeless tobacco: Never Used  . Alcohol Use: No  . Drug Use: No  . Sexual Activity: Not on file   Other Topics Concern  . Not on file   Social History Narrative    Review of Systems: All negative from GI standpoint except as stated above in HPI.  Physical Exam: Vital signs: Filed Vitals:   06/17/14 0410  BP: 89/51  Pulse: 79  Temp: 98.5 F (36.9 C)  Resp: 17   Last BM Date: 06/17/14 General:    Alert,  Well-developed, well-nourished, pleasant and cooperative in NAD HEENT: anicteric Neck: supple, nontender Lungs:  Clear throughout to auscultation.   No wheezes, crackles, or rhonchi. No acute distress. Heart:  Regular rate and rhythm; no murmurs, clicks, rubs,  or gallops. Abdomen: RUQ and epigastric tenderness with guarding, soft, nondistended, +BS  Rectal:  Deferred Ext: no edema  GI:  Lab Results:  Recent Labs  06/16/14 1857 06/17/14 0511  WBC 11.1* 6.9  HGB 14.2 11.2*  HCT 44.0 35.1*  PLT 175 138*   BMET  Recent Labs  06/16/14 1857 06/17/14 0511  NA 137 141  K 3.8 3.5  CL 104 115*  CO2 26 19  GLUCOSE 100* 86  BUN 7 <5*  CREATININE 0.73 0.78  CALCIUM 9.1 7.5*   LFT  Recent Labs  06/16/14 2331 06/17/14 0511  PROT 7.4 4.9*  ALBUMIN 4.2 2.8*  AST 26 17  ALT 45* 29  ALKPHOS 54 36*  BILITOT 0.3 0.6  BILIDIR <0.1  --   IBILI NOT CALCULATED  --    PT/INR  Recent Labs  06/17/14 0511  LABPROT 14.5  INR 1.11     Studies/Results: US Abdomen Complete  06/17/2014   CLINICAL DATA:  Right upper quadrant abdominal pain with nausea, vomiting, and diarrhea. CT scan showed biliary dilatation extending to the ampulla.  EXAM: ULTRASOUND ABDOMEN COMPLETE  COMPARISON:  06/16/2014  FINDINGS: Gallbladder: Surgically absent  Common bile duct: Diameter: 9 mm in diameter.  No stone is observed.  Liver: Mild intrahepatic biliary dilatation. No focal hepatic lesion identified.  IVC: No abnormality visualized.  Pancreas: Visualized portion unremarkable.  Spleen: Size and appearance within normal limits.  Right Kidney: Length: 11.1 cm. Echogenicity within normal limits. No mass or hydronephrosis visualized.  Left Kidney: Length: 10.7 cm. Echogenicity within normal limits. No mass or hydronephrosis visualized.  Abdominal aorta: No aneurysm visualized.  Other findings: None.  IMPRESSION: 1. Mild intrahepatic and extrahepatic biliary dilatation. No discrete stone is identified  in the CBD on today's ultrasound. Differential diagnostic considerations continue to include low grade obstruction in the vicinity of the ampulla, versus physiologic dilatation of the biliary system.   Electronically Signed   By: Sherryl Barters M.D.   On: 06/17/2014 08:03   Ct Abdomen Pelvis W Contrast  06/16/2014   CLINICAL DATA:  Acute onset severe right lower quadrant abdominal pain earlier today, intermittently radiating to the back. This is associated with nausea, vomiting and diarrhea. Prior history of pyelonephritis, nephrolithiasis and interstitial cystitis. Surgical history includes cholecystectomy.  EXAM: CT ABDOMEN AND PELVIS WITH CONTRAST  TECHNIQUE: Multidetector CT imaging of the abdomen and pelvis was performed using the standard protocol following bolus administration of intravenous contrast.  CONTRAST:  100 ml Omnipaque 300 IV. Oral  contrast was not administered at the request of the emergency department physician.  COMPARISON:  Numerous prior CT abdomen and pelvis examinations dating back to 05/31/2008, most recently 12/19/2013.  FINDINGS: Interval development of mild intra and extrahepatic biliary ductal dilation since the 12/2013 CT, the common bile duct measuring up to 10 mm diameter. The duct can be followed to the ampulla, where there is her soft tissue which protrudes into the duodenum. No evidence of bile duct stone. Gallbladder surgically absent.  Liver otherwise normal in appearance. Normal appearing spleen, pancreas, adrenal glands, and kidneys. No visible aortoiliofemoral atherosclerosis. Patent visceral arteries. No significant lymphadenopathy.  Stomach decompressed and normal in appearance. Normal appearing small bowel. Liquid stool throughout normal appearing colon. Normal appendix in the right upper pelvis. No ascites.  Normal-appearing uterus and ovaries. Urinary bladder decompressed and unremarkable. No free pelvic fluid.  Bone window images unremarkable. Visualized lung bases  clear apart from minimal expected dependent atelectasis posteriorly in the right lower lobe. Heart size normal.  IMPRESSION: 1. Interval development of mild intra and extrahepatic biliary ductal dilation since the most recent prior CT in July, 2015. The duct can be followed to the ampulla, and there is soft tissue at the ampulla which protrudes into the duodenum. Upper endoscopy may be helpful in further evaluation to exclude an ampullary tumor or choledochocele. 2. No acute abnormalities otherwise involving the abdomen or pelvis.   Electronically Signed   By: Evangeline Dakin M.D.   On: 06/16/2014 21:45    Impression/Plan:  29 yo with the acute onset of N/V/diarrhea/abdominal pain concerning for gastroenteritis but with an abnormal CT as stated above. Mild biliary dilation that could be postop and unrelated to the CT findings especially with lack of jaundice or transminitis. I doubt she has a biliary stricture and a malignancy would be very unlikely as well based on her presentation. A choledochocele is possible and EGD with possible side-viewing endoscopy is needed to further evaluate the ampulla. Clears today. NPO p MN. Dr. Penelope Coop will do with MAC for sedation tomorrow morning. Risks/benefits discussed and patient agrees to proceed.   LOS: 1 day   [place] C.  06/17/2014, 12:53 PM

## 2014-06-18 ENCOUNTER — Encounter (HOSPITAL_COMMUNITY)
Admission: EM | Disposition: A | Discharge status: Home / Self Care | Payer: 59 | Source: Home / Self Care | Attending: Internal Medicine

## 2014-06-18 ENCOUNTER — Inpatient Hospital Stay (HOSPITAL_COMMUNITY): Payer: 59 | Admitting: Anesthesiology

## 2014-06-18 ENCOUNTER — Other Ambulatory Visit: Payer: Self-pay | Admitting: Gastroenterology

## 2014-06-18 ENCOUNTER — Encounter (HOSPITAL_COMMUNITY): Payer: Self-pay | Admitting: *Deleted

## 2014-06-18 DIAGNOSIS — G43A0 Cyclical vomiting, not intractable: Secondary | ICD-10-CM

## 2014-06-18 DIAGNOSIS — R1013 Epigastric pain: Secondary | ICD-10-CM

## 2014-06-18 DIAGNOSIS — K219 Gastro-esophageal reflux disease without esophagitis: Secondary | ICD-10-CM

## 2014-06-18 HISTORY — PX: ESOPHAGOGASTRODUODENOSCOPY (EGD) WITH PROPOFOL: SHX5813

## 2014-06-18 LAB — GLUCOSE, CAPILLARY: Glucose-Capillary: 101 mg/dL — ABNORMAL HIGH (ref 70–99)

## 2014-06-18 LAB — URINE CULTURE: Colony Count: 4000

## 2014-06-18 SURGERY — ESOPHAGOGASTRODUODENOSCOPY (EGD) WITH PROPOFOL
Anesthesia: Monitor Anesthesia Care

## 2014-06-18 MED ORDER — HYDROMORPHONE HCL 1 MG/ML IJ SOLN
0.2500 mg | INTRAMUSCULAR | Status: DC | PRN
Start: 2014-06-18 — End: 2014-06-19

## 2014-06-18 MED ORDER — LACTATED RINGERS IV SOLN
INTRAVENOUS | Status: DC | PRN
  Administered 2014-06-18: 10:00:00 via INTRAVENOUS

## 2014-06-18 MED ORDER — ONDANSETRON HCL 4 MG/2ML IJ SOLN: 4.0000 mg | Freq: Once | INTRAMUSCULAR | Status: AC | PRN

## 2014-06-18 MED ORDER — BUTAMBEN-TETRACAINE-BENZOCAINE 2-2-14 % EX AERO
INHALATION_SPRAY | CUTANEOUS | Status: DC | PRN
  Administered 2014-06-18: 2 via TOPICAL

## 2014-06-18 MED ORDER — FENTANYL CITRATE 0.05 MG/ML IJ SOLN
INTRAMUSCULAR | Status: DC | PRN
  Administered 2014-06-18: 50 ug via INTRAVENOUS

## 2014-06-18 MED ORDER — PROPOFOL INFUSION 10 MG/ML OPTIME
INTRAVENOUS | Status: DC | PRN
  Administered 2014-06-18: 140 ug/kg/min via INTRAVENOUS

## 2014-06-18 MED ORDER — LACTATED RINGERS IV SOLN
INTRAVENOUS | Status: DC
  Administered 2014-06-18: 1000 mL via INTRAVENOUS

## 2014-06-18 MED ORDER — MIDAZOLAM HCL 5 MG/5ML IJ SOLN
INTRAMUSCULAR | Status: DC | PRN
  Administered 2014-06-18: 2 mg via INTRAVENOUS

## 2014-06-18 NOTE — Anesthesia Postprocedure Evaluation (Signed)
  Anesthesia Post-op Note  Patient: [name]  Procedure(s) Performed: Procedure(s): ESOPHAGOGASTRODUODENOSCOPY (EGD) WITH PROPOFOL (N/A)  Patient Location: Endoscopy Unit  Anesthesia Type: MAC  Level of Consciousness: awake, alert  and oriented  Airway and Oxygen Therapy: Patient Spontanous Breathing and Patient connected to nasal cannula oxygen  Post-op Pain: None  Post-op Assessment: Post-op Vital signs reviewed, Patient's Cardiovascular Status Stable, Respiratory Function Stable, Patent Airway, No signs of Nausea or vomiting and Pain level controlled  Post-op Vital Signs: stable  Last Vitals:  Filed Vitals:   06/18/14 1406  BP: 137/83  Pulse: 77  Temp: 36.8 C  Resp: 18    Complications: No apparent anesthesia complications
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
## 2014-06-18 NOTE — Op Note (Signed)
[facility] [place], [address]   ENDOSCOPY PROCEDURE REPORT  PATIENT: [name], [name]  MR#: [id number] BIRTHDATE: [birth year] , 28  yrs. old GENDER: female ENDOSCOPIST: Acquanetta Sit, MD REFERRED BY: PROCEDURE DATE:  Jul 17, 2014 PROCEDURE:  EGD with biopsy ASA CLASS:     2 INDICATIONS:  nausea and vomiting and epigastric pain. CT scan raises question of area of papilla bulging into intestine MEDICATIONS: propofol per anesthesia TOPICAL ANESTHETIC:  DESCRIPTION OF PROCEDURE: After the risks benefits and alternatives of the procedure were thoroughly explained, informed consent was obtained.  The      Pentax endoscope was introduced through the mouth and advanced to the second portion of the duodenum , Without limitations.  The instrument was slowly withdrawn as the mucosa was fully examined.  Findings:  Esophagus: The esophagogastric junction was at 35 cm and looked irregular. See image 008. This area was biopsied to look for any evidence of Barrett's esophagus.  Stomach: Mild erythema but otherwise normal. Biopsy of antrum was obtained primarily to look for evidence of H. pylori.  Duodenum: Normal. I removed the forward-viewing scope, and then passed a lateral viewing duodenum scope to get a face on view of the papilla of Vater. This looked normal. see image 010 and 011.    The scope was then withdrawn from the patient and the procedure completed.  COMPLICATIONS: There were no immediate complications.  ENDOSCOPIC IMPRESSION:#1. Rule out Barrett's esophagus. #2. Mild erythema of the stomach. #3. Normal-appearing papilla of Vater   RECOMMENDATIONS:continue current therapy. Advance diet.   REPEAT EXAM:  eSignedAcquanetta Sit, MD 07/17/14 12:26 PM    CC:  CPT CODES: ICD CODES:  The ICD and CPT codes recommended by this software are interpretations from the data that the clinical staff has captured with the software.   The verification of the translation of this report to the ICD and CPT codes and modifiers is the sole responsibility of the health care institution and practicing physician where this report was generated.  [employer] will not be held responsible for the validity of the ICD and CPT codes included on this report.  AMA assumes no liability for data contained or not contained herein. CPT is a Designer, television/film set of the [employer].  PATIENT NAME:  [name], [name] MR#: [id number]

## 2014-06-18 NOTE — Anesthesia Preprocedure Evaluation (Addendum)
Anesthesia Evaluation  Patient identified by MRN, date of birth, ID band Patient awake    Reviewed: Allergy & Precautions, NPO status , Patient's Chart, lab work & pertinent test results  Airway Mallampati: I  TM Distance: >3 FB Neck ROM: Full    Dental  (+) Teeth Intact, Dental Advisory Given   Pulmonary Current Smoker,  breath sounds clear to auscultation        Cardiovascular Rhythm:Regular Rate:Normal     Neuro/Psych    GI/Hepatic   Endo/Other    Renal/GU      Musculoskeletal   Abdominal   Peds  Hematology   Anesthesia Other Findings   Reproductive/Obstetrics                           Anesthesia Physical Anesthesia Plan  ASA: II  Anesthesia Plan: MAC   Post-op Pain Management:    Induction: Intravenous  Airway Management Planned: Nasal Cannula  Additional Equipment:   Intra-op Plan:   Post-operative Plan:   Informed Consent: I have reviewed the patients History and Physical, chart, labs and discussed the procedure including the risks, benefits and alternatives for the proposed anesthesia with the patient or authorized representative who has indicated his/her understanding and acceptance.   Dental advisory given  Plan Discussed with: CRNA and Anesthesiologist  Anesthesia Plan Comments:       Anesthesia Quick Evaluation

## 2014-06-18 NOTE — Anesthesia Procedure Notes (Signed)
Procedure Name: MAC Date/Time: 06/18/2014 11:52 AM Performed by: Kyung Rudd Pre-anesthesia Checklist: Patient identified, Emergency Drugs available, Suction available, Patient being monitored and Timeout performed Patient Re-evaluated:Patient Re-evaluated prior to inductionOxygen Delivery Method: Nasal cannula Intubation Type: IV induction Placement Confirmation: positive ETCO2

## 2014-06-18 NOTE — Transfer of Care (Signed)
Immediate Anesthesia Transfer of Care Note  Patient: [name]  Procedure(s) Performed: Procedure(s): ESOPHAGOGASTRODUODENOSCOPY (EGD) WITH PROPOFOL (N/A)  Patient Location: Endoscopy Unit  Anesthesia Type:MAC  Level of Consciousness: awake, alert  and oriented  Airway & Oxygen Therapy: Patient Spontanous Breathing and Patient connected to nasal cannula oxygen  Post-op Assessment: Report given to PACU RN, Post -op Vital signs reviewed and stable and Patient moving all extremities X 4  Post vital signs: Reviewed and stable  Complications: No apparent anesthesia complications

## 2014-06-19 ENCOUNTER — Encounter (HOSPITAL_COMMUNITY): Payer: Self-pay | Admitting: Gastroenterology

## 2014-06-19 LAB — GC/CHLAMYDIA PROBE AMP
CT Probe RNA: NEGATIVE
GC PROBE AMP APTIMA: NEGATIVE

## 2014-06-19 LAB — GLUCOSE, CAPILLARY
GLUCOSE-CAPILLARY: 115 mg/dL — AB (ref 70–99)
GLUCOSE-CAPILLARY: 105 mg/dL — AB (ref 70–99)
Glucose-Capillary: 98 mg/dL (ref 70–99)

## 2014-06-19 MED ORDER — ONDANSETRON HCL 4 MG PO TABS: 4.0000 mg | ORAL_TABLET | Freq: Four times a day (QID) | ORAL | Status: DC | PRN

## 2014-06-19 MED ORDER — OXYCODONE HCL 5 MG PO TABS
5.0000 mg | ORAL_TABLET | Freq: Four times a day (QID) | ORAL | Status: DC | PRN
  Filled 2014-06-19: qty 1

## 2014-06-19 MED ORDER — HYDROMORPHONE HCL 1 MG/ML IJ SOLN
0.5000 mg | INTRAMUSCULAR | Status: DC | PRN
  Administered 2014-06-19: 0.5 mg via INTRAVENOUS
  Filled 2014-06-19: qty 1

## 2014-06-19 MED ORDER — PANTOPRAZOLE SODIUM 40 MG PO TBEC: 40.0000 mg | DELAYED_RELEASE_TABLET | Freq: Every day | ORAL | Status: DC

## 2014-06-19 MED ORDER — PROMETHAZINE HCL 25 MG/ML IJ SOLN
12.5000 mg | Freq: Four times a day (QID) | INTRAMUSCULAR | Status: DC | PRN
  Administered 2014-06-19: 12.5 mg via INTRAVENOUS
  Filled 2014-06-19 (×2): qty 1

## 2014-06-19 MED ORDER — OXYCODONE HCL 5 MG PO TABS: 5.0000 mg | ORAL_TABLET | Freq: Four times a day (QID) | ORAL | Status: DC | PRN

## 2014-06-19 NOTE — Progress Notes (Signed)
[facility] Progress Note  Subjective: She looks fine. She was talking on the phone when I came in. Said she has some upper abdominal pain, and a little nausea but better. She wants to go home.  Objective: Vital signs in last 24 hours: Temp:  [97.7 F (36.5 C)-98.6 F (37 C)] 97.7 F (36.5 C) (01/06 1336) Pulse Rate:  [53-65] 59 (01/06 1336) Resp:  [16-20] 20 (01/06 1336) BP: (93-126)/(57-95) 124/95 mmHg (01/06 1336) SpO2:  [95 %-100 %] 100 % (01/06 1336) Weight change:    PE:  No distress Abdomen soft.    Lab Results: Results for orders placed or performed during the hospital encounter of 06/16/14 (from the past 24 hour(s))  Glucose, capillary     Status: None   Collection Time: 06/19/14  7:39 AM  Result Value Ref Range   Glucose-Capillary 98 70 - 99 mg/dL  Glucose, capillary     Status: Abnormal   Collection Time: 06/19/14 11:55 AM  Result Value Ref Range   Glucose-Capillary 115 (H) 70 - 99 mg/dL    Studies/Results: No results found.    Assessment: Abdominal pain and nausea. Seems improved. EGD done yesterday and nothing to explain the pain. CT abdomen also does not show anything to explain the pain. Although she had some dilation of the CBD, she is post lap chole last year and her LFTs are normal. No ampullary lesion on EGD.  Plan: I think she can go home.    Wonda Horner 06/19/2014, 3:46 PM
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
## 2014-06-20 ENCOUNTER — Ambulatory Visit: Payer: Self-pay | Admitting: Family Medicine

## 2014-06-22 ENCOUNTER — Ambulatory Visit (HOSPITAL_BASED_OUTPATIENT_CLINIC_OR_DEPARTMENT_OTHER)
Admission: RE | Admit: 2014-06-22 | Discharge: 2014-06-22 | Disposition: A | Discharge status: Home / Self Care | Payer: 59 | Source: Ambulatory Visit | Attending: Family Medicine | Admitting: Family Medicine

## 2014-06-22 DIAGNOSIS — M545 Low back pain: Secondary | ICD-10-CM | POA: Insufficient documentation

## 2014-06-22 DIAGNOSIS — M129 Arthropathy, unspecified: Secondary | ICD-10-CM | POA: Insufficient documentation

## 2014-06-22 DIAGNOSIS — M5441 Lumbago with sciatica, right side: Secondary | ICD-10-CM

## 2014-06-22 DIAGNOSIS — M79604 Pain in right leg: Secondary | ICD-10-CM | POA: Insufficient documentation

## 2014-06-23 LAB — CULTURE, BLOOD (ROUTINE X 2)
CULTURE: NO GROWTH
Culture: NO GROWTH

## 2014-06-24 ENCOUNTER — Ambulatory Visit (INDEPENDENT_AMBULATORY_CARE_PROVIDER_SITE_OTHER): Payer: 59 | Admitting: Family Medicine

## 2014-06-24 ENCOUNTER — Encounter: Payer: Self-pay | Admitting: Family Medicine

## 2014-06-24 VITALS — BP 100/72 | HR 79 | Temp 97.8°F | Resp 16 | Wt 158.8 lb

## 2014-06-24 DIAGNOSIS — M545 Low back pain, unspecified: Secondary | ICD-10-CM

## 2014-06-24 DIAGNOSIS — K2100 Gastro-esophageal reflux disease with esophagitis, without bleeding: Secondary | ICD-10-CM

## 2014-06-24 DIAGNOSIS — K2270 Barrett's esophagus without dysplasia: Secondary | ICD-10-CM

## 2014-06-24 MED ORDER — PANTOPRAZOLE SODIUM 40 MG PO TBEC: 40.0000 mg | DELAYED_RELEASE_TABLET | Freq: Every day | ORAL | Status: DC

## 2014-06-24 MED ORDER — ONDANSETRON HCL 4 MG PO TABS: 4.0000 mg | ORAL_TABLET | Freq: Four times a day (QID) | ORAL | Status: DC | PRN

## 2014-06-24 NOTE — Progress Notes (Signed)
Subjective:     [name] is an 29 y.o. female who presents for evaluation of heartburn and f/u from hospital.  This has been associated with midespigastric pain, nausea and vomiting. She denies abdominal bloating, belching, belching and eructation, bilious reflux, chest pain, choking on food, cough, deep pressure at base of neck, difficulty swallowing, dysphagia, early satiety, fullness after meals, heartburn, hematemesis, hoarseness, laryngitis, melena, need to clear throat frequently, nocturnal burning, odynophagia, shortness of breath, symptoms primarily relate to meals, and lying down after meals, unexpected weight loss and wheezing. Symptoms have been present for 1 week. She denies dysphagia. She has not lost weight. She denies melena, hematochezia, hematemesis, and coffee ground emesis. Medical therapy in the past has included: none.---- pt had EGD --- + barretts esophagus. Pt d/c with zofran,  protonix and is feeling better.  She has and appointment with [facility]--- 1/ 25.    The following portions of the patient's history were reviewed and updated as appropriate:  She  has a past medical history of Gastric reflux; Anxiety; Basedow's disease; Pyelonephritis; History of nephrolithiasis; Anxiety disorder; Bipolar disorder; Interstitial cystitis; Chronic back pain; and Fibroid. She  does not have any pertinent problems on file. She  has past surgical history that includes Tonsillectomy; Adenoidectomy; Tear duct probing; Cholecystectomy (N/A, 01/05/2014); and Esophagogastroduodenoscopy (egd) with propofol (N/A, 06/18/2014). Her family history includes Bipolar disorder in her sister; Diabetes in an other family member; Hypertension in an other family member; Lung cancer in an other family member. She  reports that she has been smoking Cigarettes.  She has a 2 pack-year smoking history. She has never used smokeless tobacco. She reports that she does not drink alcohol or use illicit drugs. She  has a current medication list which includes the following prescription(s): amphetamine-dextroamphetamine, levonorgestrel-ethinyl estradiol, metaxalone, ondansetron, oxycodone, and pantoprazole. Current Outpatient Prescriptions on File Prior to Visit  Medication Sig Dispense Refill  . amphetamine-dextroamphetamine (ADDERALL) 30 MG tablet Take 1 tablet by mouth daily. 30 tablet 0  . levonorgestrel-ethinyl estradiol (AVIANE,ALESSE,LESSINA) 0.1-20 MG-MCG tablet Take 1 tablet by mouth daily.    . metaxalone (SKELAXIN) 800 MG tablet Take 1 tablet (800 mg total) by mouth 3 (three) times daily. (Patient taking differently: Take 800 mg by mouth daily as needed for muscle spasms. ) 60 tablet 0  . ondansetron (ZOFRAN) 4 MG tablet Take 1 tablet (4 mg total) by mouth every 6 (six) hours as needed for nausea or vomiting. 20 tablet 0  . oxyCODONE (OXY IR/ROXICODONE) 5 MG immediate release tablet Take 1 tablet (5 mg total) by mouth every 6 (six) hours as needed for moderate pain. 20 tablet 0  . pantoprazole (PROTONIX) 40 MG tablet Take 1 tablet (40 mg total) by mouth daily at 12 noon. 30 tablet 0   No current facility-administered medications on file prior to visit.   She has No Known Allergies..  Review of Systems Pertinent items are noted in HPI.   Objective:     BP 100/72 mmHg  Pulse 79  Temp(Src) 97.8 F (36.6 C) (Oral)  Resp 16  Wt 158 lb 12.8 oz (72.031 kg)  SpO2 99%  LMP 06/02/2014 (Exact Date) General appearance: alert, cooperative, appears stated age and no distress Throat: lips, mucosa, and tongue normal; teeth and gums normal Neck: no adenopathy, no carotid bruit, no JVD, supple, symmetrical, trachea midline and thyroid not enlarged, symmetric, no tenderness/mass/nodules Lungs: clear to auscultation bilaterally Heart: S1, S2 normal Abdomen: abnormal findings:  moderate tenderness in the  epigastrium  Neuro-- antalgic gait,  dtr equal and b/l  Assessment:    Gastroesophageal Reflux  Disease,      Plan:    Nonpharmacologic treatments were discussed including: eating smaller meals, elevation of the head of bed at night, avoidance of caffeine, chocolate, nicotine and peppermint, and avoiding tight fitting clothing. Will start a trial of proton pump inhibitors. f/u prn    1. Barrett's esophagus determined by biopsy F/u GI - ondansetron (ZOFRAN) 4 MG tablet; Take 1 tablet (4 mg total) by mouth every 6 (six) hours as needed for nausea or vomiting.  Dispense: 30 tablet; Refill: 1 - pantoprazole (PROTONIX) 40 MG tablet; Take 1 tablet (40 mg total) by mouth daily at 12 noon.  Dispense: 30 tablet; Refill: 5  2. Gastroesophageal reflux disease with esophagitis   - ondansetron (ZOFRAN) 4 MG tablet; Take 1 tablet (4 mg total) by mouth every 6 (six) hours as needed for nausea or vomiting.  Dispense: 30 tablet; Refill: 1 - pantoprazole (PROTONIX) 40 MG tablet; Take 1 tablet (40 mg total) by mouth daily at 12 noon.  Dispense: 30 tablet; Refill: 5  3. Back pain at L4-L5 level CLINICAL DATA: Low back pain, right buttock pain, right leg pain  EXAM: MRI LUMBAR SPINE WITHOUT CONTRAST  TECHNIQUE: Multiplanar, multisequence MR imaging of the lumbar spine was performed. No intravenous contrast was administered.  COMPARISON: 04/05/2008  FINDINGS: There is transitional anatomy with a rudimentary disc space at S1-S2. The vertebral bodies of the lumbar spine are normal in size. The vertebral bodies of the lumbar spine are normal in alignment. There is normal bone marrow signal demonstrated throughout the vertebra. The intervertebral disc spaces are well-maintained.  The spinal cord is normal in signal and contour. The cord terminates normally at L1 . The nerve roots of the cauda equina and the filum terminale are normal.  The visualized portions of the SI joints are unremarkable.  The imaged intra-abdominal contents are unremarkable.  T12-L1: No significant disc  bulge. No evidence of neural foraminal stenosis. No central canal stenosis.  L1-L2: No significant disc bulge. No evidence of neural foraminal stenosis. No central canal stenosis.  L2-L3: No significant disc bulge. No evidence of neural foraminal stenosis. No central canal stenosis.  L3-L4: No significant disc bulge. No evidence of neural foraminal stenosis. No central canal stenosis.  L4-L5: No significant disc bulge. No evidence of neural foraminal stenosis. No central canal stenosis.  L5-S1: No significant disc bulge. No evidence of neural foraminal stenosis. No central canal stenosis. Mild bilateral facet arthropathy.  IMPRESSION: 1. No significant lumbar spine disc protrusion, foraminal stenosis or spinal stenosis. 2. Mild bilateral facet arthropathy at L5-S1. No significant interval change compared with 04/05/2008.   Electronically Signed  By: Kathreen Devoid  On: 06/22/2014 16:13  - Ambulatory referral to Physical Therapy

## 2014-06-24 NOTE — Progress Notes (Signed)
Pre visit review using our clinic review tool, if applicable. No additional management support is needed unless otherwise documented below in the visit note. 

## 2014-06-24 NOTE — Patient Instructions (Addendum)
Back Pain, Adult Low back pain is very common. About 1 in 5 people have back pain.The cause of low back pain is rarely dangerous. The pain often gets better over time.About half of people with a sudden onset of back pain feel better in just 2 weeks. About 8 in 10 people feel better by 6 weeks.  CAUSES Some common causes of back pain include:  Strain of the muscles or ligaments supporting the spine.  Wear and tear (degeneration) of the spinal discs.  Arthritis.  Direct injury to the back. DIAGNOSIS Most of the time, the direct cause of low back pain is not known.However, back pain can be treated effectively even when the exact cause of the pain is unknown.Answering your caregiver's questions about your overall health and symptoms is one of the most accurate ways to make sure the cause of your pain is not dangerous. If your caregiver needs more information, he or she may order lab work or imaging tests (X-rays or MRIs).However, even if imaging tests show changes in your back, this usually does not require surgery. HOME CARE INSTRUCTIONS For many people, back pain returns.Since low back pain is rarely dangerous, it is often a condition that people can learn to manageon their own.   Remain active. It is stressful on the back to sit or stand in one place. Do not sit, drive, or stand in one place for more than 30 minutes at a time. Take short walks on level surfaces as soon as pain allows.Try to increase the length of time you walk each day.  Do not stay in bed.Resting more than 1 or 2 days can delay your recovery.  Do not avoid exercise or work.Your body is made to move.It is not dangerous to be active, even though your back may hurt.Your back will likely heal faster if you return to being active before your pain is gone.  Pay attention to your body when you bend and lift. Many people have less discomfortwhen lifting if they bend their knees, keep the load close to their bodies,and  avoid twisting. Often, the most comfortable positions are those that put less stress on your recovering back.  Find a comfortable position to sleep. Use a firm mattress and lie on your side with your knees slightly bent. If you lie on your back, put a pillow under your knees.  Only take over-the-counter or prescription medicines as directed by your caregiver. Over-the-counter medicines to reduce pain and inflammation are often the most helpful.Your caregiver may prescribe muscle relaxant drugs.These medicines help dull your pain so you can more quickly return to your normal activities and healthy exercise.  Put ice on the injured area.  Put ice in a plastic bag.  Place a towel between your skin and the bag.  Leave the ice on for 15-20 minutes, 03-04 times a day for the first 2 to 3 days. After that, ice and heat may be alternated to reduce pain and spasms.  Ask your caregiver about trying back exercises and gentle massage. This may be of some benefit.  Avoid feeling anxious or stressed.Stress increases muscle tension and can worsen back pain.It is important to recognize when you are anxious or stressed and learn ways to manage it.Exercise is a great option. SEEK MEDICAL CARE IF:  You have pain that is not relieved with rest or medicine.  You have pain that does not improve in 1 week.  You have new symptoms.  You are generally not feeling well. SEEK   IMMEDIATE MEDICAL CARE IF:   You have pain that radiates from your back into your legs.  You develop new bowel or bladder control problems.  You have unusual weakness or numbness in your arms or legs.  You develop nausea or vomiting.  You develop abdominal pain.  You feel faint. Document Released: 05/31/2005 Document Revised: 11/30/2011 Document Reviewed: 10/02/2013 [facility] Patient Information 2015 [place], [place]. This information is not intended to replace advice given to you by your health care provider. Make sure you  discuss any questions you have with your health care provider. Food Choices for Gastroesophageal Reflux Disease When you have gastroesophageal reflux disease (GERD), the foods you eat and your eating habits are very important. Choosing the right foods can help ease the discomfort of GERD. WHAT GENERAL GUIDELINES DO I NEED TO FOLLOW?  Choose fruits, vegetables, whole grains, low-fat dairy products, and low-fat meat, fish, and poultry.  Limit fats such as oils, salad dressings, butter, nuts, and avocado.  Keep a food diary to identify foods that cause symptoms.  Avoid foods that cause reflux. These may be different for different people.  Eat frequent small meals instead of three large meals each day.  Eat your meals slowly, in a relaxed setting.  Limit fried foods.  Cook foods using methods other than frying.  Avoid drinking alcohol.  Avoid drinking large amounts of liquids with your meals.  Avoid bending over or lying down until 2-3 hours after eating. WHAT FOODS ARE NOT RECOMMENDED? The following are some foods and drinks that may worsen your symptoms: Vegetables Tomatoes. Tomato juice. Tomato and spaghetti sauce. Chili peppers. Onion and garlic. Horseradish. Fruits Oranges, grapefruit, and lemon (fruit and juice). Meats High-fat meats, fish, and poultry. This includes hot dogs, ribs, ham, sausage, salami, and bacon. Dairy Whole milk and chocolate milk. Sour cream. Cream. Butter. Ice cream. Cream cheese.  Beverages Coffee and tea, with or without caffeine. Carbonated beverages or energy drinks. Condiments Hot sauce. Barbecue sauce.  Sweets/Desserts Chocolate and cocoa. Donuts. Peppermint and spearmint. Fats and Oils High-fat foods, including [place] fries and potato chips. Other Vinegar. Strong spices, such as black pepper, white pepper, red pepper, cayenne, curry powder, cloves, ginger, and chili powder. The items listed above may not be a complete list of foods and  beverages to avoid. Contact your dietitian for more information. Document Released: 05/31/2005 Document Revised: 06/05/2013 Document Reviewed: 04/04/2013 [facility] Patient Information 2015 [place], [place]. This information is not intended to replace advice given to you by your health care provider. Make sure you discuss any questions you have with your health care provider.

## 2014-07-03 ENCOUNTER — Ambulatory Visit: Payer: No Typology Code available for payment source | Attending: Family Medicine

## 2014-07-03 DIAGNOSIS — M545 Low back pain: Secondary | ICD-10-CM | POA: Insufficient documentation

## 2014-07-03 DIAGNOSIS — M5416 Radiculopathy, lumbar region: Secondary | ICD-10-CM | POA: Insufficient documentation

## 2014-07-09 ENCOUNTER — Telehealth: Payer: Self-pay | Admitting: Family Medicine

## 2014-07-09 DIAGNOSIS — M545 Low back pain: Secondary | ICD-10-CM

## 2014-07-09 NOTE — Telephone Encounter (Signed)
Ref has been placed and the patient has been made aware.      KP

## 2014-07-09 NOTE — Telephone Encounter (Signed)
Caller name: Deanda Relation to pt: self Call back number: [phone] Pharmacy:  Reason for call:   Patient states that she has been to PT but is still having shooting pain going down her leg and now here leg is numb.  Please call after 2pm

## 2014-07-09 NOTE — Telephone Encounter (Signed)
Spoke patient and she stated she has PT ;last Wednesday and it made her issues worst, she said now when she picks up her 19 pound son her right neg goes numb (feels like it is asleep). She denied having any falls and wants to know what to do? Please advise     KP

## 2014-07-09 NOTE — Telephone Encounter (Signed)
Refer to ortho.

## 2014-07-10 ENCOUNTER — Ambulatory Visit: Payer: No Typology Code available for payment source | Admitting: Rehabilitation

## 2014-07-10 DIAGNOSIS — M545 Low back pain: Secondary | ICD-10-CM | POA: Diagnosis not present

## 2014-07-17 ENCOUNTER — Ambulatory Visit: Payer: 59 | Attending: Family Medicine

## 2014-07-17 DIAGNOSIS — M5416 Radiculopathy, lumbar region: Secondary | ICD-10-CM | POA: Insufficient documentation

## 2014-07-17 DIAGNOSIS — M545 Low back pain: Secondary | ICD-10-CM | POA: Insufficient documentation

## 2014-07-22 ENCOUNTER — Emergency Department
Admission: EM | Admit: 2014-07-22 | Discharge: 2014-07-22 | Disposition: A | Discharge status: Home / Self Care | Payer: 59 | Source: Home / Self Care

## 2014-07-22 ENCOUNTER — Encounter: Payer: Self-pay | Admitting: *Deleted

## 2014-07-22 DIAGNOSIS — L739 Follicular disorder, unspecified: Secondary | ICD-10-CM

## 2014-07-22 HISTORY — DX: Barrett's esophagus without dysplasia: K22.70

## 2014-07-22 HISTORY — DX: Other specified behavioral and emotional disorders with onset usually occurring in childhood and adolescence: F98.8

## 2014-07-22 HISTORY — DX: Dorsalgia, unspecified: M54.9

## 2014-07-22 MED ORDER — DOXYCYCLINE HYCLATE 100 MG PO CAPS: 100.0000 mg | ORAL_CAPSULE | Freq: Two times a day (BID) | ORAL | Status: DC

## 2014-07-22 NOTE — ED Provider Notes (Signed)
CSN: [id number]     Arrival date & time 07/22/14  1428 History   None    Chief Complaint  Patient presents with  . Abscess     HPI Comments: Patient complains of one week history of painful "bumps" in her right axilla.  The area became more painful today.  No drainage from the area.  No fevers, chills, and sweats.  She had an episode of nausea/vomiting today, now resolved.  She feels well otherwise.  She shaves her axillae regularly.  She states that she has had staph infections in the past.  Patient is a 29 y.o. female presenting with abscess. The history is provided by the patient.  Abscess Abscess location: right axilla. Abscess quality: painful and redness   Abscess quality: not draining, no fluctuance, no induration, no itching, no warmth and not weeping   Red streaking: no   Duration:  1 week Progression:  Worsening Pain details:    Quality:  Aching   Severity:  Mild   Duration:  1 week   Timing:  Constant   Progression:  Worsening Chronicity:  New Relieved by:  Nothing Exacerbated by: movement of right shoulder. Ineffective treatments:  None tried Associated symptoms: nausea and vomiting   Associated symptoms: no fatigue and no fever     Past Medical History  Diagnosis Date  . Gastric reflux   . Anxiety   . Basedow's disease   . Pyelonephritis   . History of nephrolithiasis   . Anxiety disorder   . Bipolar disorder   . Interstitial cystitis   . Chronic back pain   . Fibroid   . Barrett esophagus   . ADD (attention deficit disorder)   . Back pain    Past Surgical History  Procedure Laterality Date  . Tonsillectomy    . Adenoidectomy    . Tear duct probing    . Cholecystectomy N/A 01/05/2014    Procedure: LAPAROSCOPIC CHOLECYSTECTOMY WITH INTRAOPERATIVE CHOLANGIOGRAM;  Surgeon: Edward Jolly, MD;  Location: [facility];  Service: General;  Laterality: N/A;  . Esophagogastroduodenoscopy (egd) with propofol N/A 06/18/2014    Procedure: ESOPHAGOGASTRODUODENOSCOPY  (EGD) WITH PROPOFOL;  Surgeon: Wonda Horner, MD;  Location: [facility] ENDOSCOPY;  Service: Endoscopy;  Laterality: N/A;   Family History  Problem Relation Age of Onset  . Diabetes    . Hypertension    . Lung cancer    . Bipolar disorder Sister    History  Substance Use Topics  . Smoking status: Current Some Day Smoker -- 0.25 packs/day for 8 years    Types: Cigarettes  . Smokeless tobacco: Never Used  . Alcohol Use: No   OB History    Gravida Para Term Preterm AB TAB SAB Ectopic Multiple Living   1 1 1       1      Review of Systems  Constitutional: Negative for fever and fatigue.  Gastrointestinal: Positive for nausea and vomiting.  All other systems reviewed and are negative.   Allergies  Review of patient's allergies indicates no known allergies.  Home Medications   Prior to Admission medications   Medication Sig Start Date End Date Taking? Authorizing Provider  amphetamine-dextroamphetamine (ADDERALL) 30 MG tablet Take 1 tablet by mouth daily. 06/04/14  Yes Rosalita Chessman, DO  levonorgestrel-ethinyl estradiol (AVIANE,ALESSE,LESSINA) 0.1-20 MG-MCG tablet Take 1 tablet by mouth daily.   Yes Historical Provider, MD  oxyCODONE (OXY IR/ROXICODONE) 5 MG immediate release tablet Take 1 tablet (5 mg total) by mouth every  6 (six) hours as needed for moderate pain. 06/19/14  Yes Shanker Kristeen Mans, MD  pantoprazole (PROTONIX) 40 MG tablet Take 1 tablet (40 mg total) by mouth daily at 12 noon. 06/24/14  Yes Rosalita Chessman, DO  doxycycline (VIBRAMYCIN) 100 MG capsule Take 1 capsule (100 mg total) by mouth 2 (two) times daily. Take with food. 07/22/14   Kandra Nicolas, MD  metaxalone (SKELAXIN) 800 MG tablet Take 1 tablet (800 mg total) by mouth 3 (three) times daily. Patient taking differently: Take 800 mg by mouth daily as needed for muscle spasms.  06/04/14   Rosalita Chessman, DO  ondansetron (ZOFRAN) 4 MG tablet Take 1 tablet (4 mg total) by mouth every 6 (six) hours as needed for nausea or  vomiting. 06/24/14   Alferd Apa Lowne, DO   BP 120/82 mmHg  Pulse 88  Temp(Src) 98.4 F (36.9 C) (Oral)  Resp 16  Ht 5\' 6"  (1.676 m)  Wt 155 lb (70.308 kg)  BMI 25.03 kg/m2  SpO2 97%  LMP 07/08/2014 Physical Exam Nursing notes and Vital Signs reviewed. Appearance:  Patient appears stated age, and in no acute distress Eyes:  Pupils are equal, round, and reactive to light and accomodation.  Extraocular movement is intact.  Conjunctivae are not inflamed   Pharynx:  Normal Neck:  Supple.  No adenopathy Lungs:  Clear to auscultation.  Breath sounds are equal.  Heart:  Regular rate and rhythm without murmurs, rubs, or gallops.  Abdomen:  Nontender    Skin:  Right axilla has two macular erythematous macules 1cm diameter, tender to palpation without induration or fluctuance   ED Course  Procedures  none   MDM   1. Acute folliculitis    Begin doxycycline 100mg  bid for 10 days. Apply warm compress several times daily.  May take Tylenol as needed for pain. Followup with Family Doctor if not improved in one week.  Suggest that she let her razor soak in anti-bacterial solution between shaves.    Kandra Nicolas, MD 07/22/14 [phone]

## 2014-07-22 NOTE — Discharge Instructions (Signed)
Apply warm compress several times daily.  May take Tylenol as needed for pain.   Folliculitis  Folliculitis is redness, soreness, and swelling (inflammation) of the hair follicles. This condition can occur anywhere on the body. People with weakened immune systems, diabetes, or obesity have a greater risk of getting folliculitis. CAUSES  Bacterial infection. This is the most common cause.  Fungal infection.  Viral infection.  Contact with certain chemicals, especially oils and tars. Long-term folliculitis can result from bacteria that live in the nostrils. The bacteria may trigger multiple outbreaks of folliculitis over time. SYMPTOMS Folliculitis most commonly occurs on the scalp, thighs, legs, back, buttocks, and areas where hair is shaved frequently. An early sign of folliculitis is a small, white or yellow, pus-filled, itchy lesion (pustule). These lesions appear on a red, inflamed follicle. They are usually less than 0.2 inches (5 mm) wide. When there is an infection of the follicle that goes deeper, it becomes a boil or furuncle. A group of closely packed boils creates a larger lesion (carbuncle). Carbuncles tend to occur in hairy, sweaty areas of the body. DIAGNOSIS  Your caregiver can usually tell what is wrong by doing a physical exam. A sample may be taken from one of the lesions and tested in a lab. This can help determine what is causing your folliculitis. TREATMENT  Treatment may include:  Applying warm compresses to the affected areas.  Taking antibiotic medicines orally or applying them to the skin.  Draining the lesions if they contain a large amount of pus or fluid.  Laser hair removal for cases of long-lasting folliculitis. This helps to prevent regrowth of the hair. HOME CARE INSTRUCTIONS  Apply warm compresses to the affected areas as directed by your caregiver.  If antibiotics are prescribed, take them as directed. Finish them even if you start to feel  better.  You may take over-the-counter medicines to relieve itching.  Do not shave irritated skin.  Follow up with your caregiver as directed. SEEK IMMEDIATE MEDICAL CARE IF:   You have increasing redness, swelling, or pain in the affected area.  You have a fever. MAKE SURE YOU:  Understand these instructions.  Will watch your condition.  Will get help right away if you are not doing well or get worse. Document Released: 08/09/2001 Document Revised: 11/30/2011 Document Reviewed: 08/31/2011 [facility] Patient Information 2015 [place], [place]. This information is not intended to replace advice given to you by your health care provider. Make sure you discuss any questions you have with your health care provider.

## 2014-07-22 NOTE — ED Notes (Signed)
Pt c/o abscess under her RT arm x 1 wk. She c/o nausea and dizziness x today. Denies fever. She reports a hx of staph infection.

## 2014-07-24 ENCOUNTER — Ambulatory Visit: Payer: 59

## 2014-07-24 DIAGNOSIS — M5416 Radiculopathy, lumbar region: Secondary | ICD-10-CM | POA: Diagnosis not present

## 2014-07-24 DIAGNOSIS — M545 Low back pain: Secondary | ICD-10-CM | POA: Diagnosis present

## 2014-07-25 ENCOUNTER — Telehealth: Payer: Self-pay | Admitting: Emergency Medicine

## 2014-07-31 ENCOUNTER — Ambulatory Visit: Payer: 59 | Admitting: Rehabilitation

## 2014-08-07 ENCOUNTER — Ambulatory Visit: Payer: 59

## 2014-08-14 ENCOUNTER — Ambulatory Visit: Payer: 59 | Attending: Family Medicine

## 2014-08-14 DIAGNOSIS — M545 Low back pain: Secondary | ICD-10-CM | POA: Insufficient documentation

## 2014-08-14 DIAGNOSIS — M5416 Radiculopathy, lumbar region: Secondary | ICD-10-CM | POA: Diagnosis not present

## 2014-08-14 DIAGNOSIS — M5441 Lumbago with sciatica, right side: Secondary | ICD-10-CM

## 2014-08-14 NOTE — Therapy (Addendum)
[facility] [address]  [place], [place], [address] Phone: [phone]   Fax:  [phone]  Physical Therapy Treatment/Renewal  Patient Details  Name: [name] MRN: [id number] Date of Birth: 21-Feb-1986 Referring Provider:  Rosalita Chessman, DO  Encounter Date: 08/14/2014      PT End of Session - 08/14/14 1544    Visit Number 5   Number of Visits 10   Date for PT Re-Evaluation 09/13/14   PT Start Time 1502   PT Stop Time 1555   PT Time Calculation (min) 53 min   Activity Tolerance Patient tolerated treatment well   Behavior During Therapy [facility] for tasks assessed/performed      Past Medical History  Diagnosis Date  . Gastric reflux   . Anxiety   . Basedow's disease   . Pyelonephritis   . History of nephrolithiasis   . Anxiety disorder   . Bipolar disorder   . Interstitial cystitis   . Chronic back pain   . Fibroid   . Barrett esophagus   . ADD (attention deficit disorder)   . Back pain     Past Surgical History  Procedure Laterality Date  . Tonsillectomy    . Adenoidectomy    . Tear duct probing    . Cholecystectomy N/A 01/05/2014    Procedure: LAPAROSCOPIC CHOLECYSTECTOMY WITH INTRAOPERATIVE CHOLANGIOGRAM;  Surgeon: Edward Jolly, MD;  Location: [facility];  Service: General;  Laterality: N/A;  . Esophagogastroduodenoscopy (egd) with propofol N/A 06/18/2014    Procedure: ESOPHAGOGASTRODUODENOSCOPY (EGD) WITH PROPOFOL;  Surgeon: Wonda Horner, MD;  Location: [facility] ENDOSCOPY;  Service: Endoscopy;  Laterality: N/A;    LMP 07/08/2014  Visit Diagnosis:  Midline low back pain with right-sided sciatica - Plan: PT plan of care cert/re-cert      Subjective Assessment - 08/14/14 1500    Symptoms Still having pain into right leg.  Not doing exercises.  Had so much going on    How long can you stand comfortably? work shift; starts after standing at work about 6 hours   Pain Score 4   8/10 at worst    Pain Location Back  radiates to right knee   Pain Type Chronic pain   Pain Onset More than a month ago   Pain Frequency Intermittent   Aggravating Factors  standing on it   Pain Relieving Factors hot bath, lying down          [facility] PT Assessment - 08/14/14 0001    ROM / Strength   AROM / PROM / Strength AROM;Strength   Strength   Strength Assessment Site Hip;Knee;Ankle   Right/Left Hip Right;Left   Right Hip Flexion 4/5   Right Hip Extension 3+/5   Right Hip ABduction 4-/5   Left Hip Flexion 4+/5   Left Hip Extension 4/5   Left Hip ABduction 5/5   Right/Left Knee Right;Left   Right Knee Flexion 3+/5   Right Knee Extension 4/5   Left Knee Flexion 4-/5   Left Knee Extension 4+/5   Right/Left Ankle Right;Left   Right Ankle Dorsiflexion 4-/5   Left Ankle Dorsiflexion 4+/5                  [facility] Adult PT Treatment/Exercise - 08/14/14 1456    Exercises   Exercises Lumbar;Knee/Hip   Lumbar Exercises: Aerobic   Stationary Bike level 2 x 6 minutes   Lumbar Exercises: Supine   Ab Set 10 reps;5  seconds  with PPT   Lumbar Exercises: Prone   Other Prone Lumbar Exercises prone press up 3 x 10 sec holds   Lumbar Exercises: Quadruped   Opposite Arm/Leg Raise 5 seconds;5 reps;Right arm/Left leg;Left arm/Right leg   Knee/Hip Exercises: Stretches   Active Hamstring Stretch 3 reps;30 seconds  with strap both legs   Quad Stretch 2 reps;20 seconds  with strap in prone   Knee/Hip Exercises: Supine   Straight Leg Raises Both;Strengthening;1 set;10 reps  2# 3sec hold   Knee/Hip Exercises: Sidelying   Hip ABduction Strengthening;Both;1 set;10 reps  2# 3 sec hold   Knee/Hip Exercises: Prone   Straight Leg Raises Strengthening;Both;1 set;10 reps  3 sec hold   Modalities   Modalities Moist Heat   Moist Heat Therapy   Number Minutes Moist Heat 12 Minutes   Moist Heat Location Other (comment)  lumbar in prone                PT Education - 08/14/14 1543     Education provided Yes   Education Details current HEP and POC   Person(s) Educated Patient   Methods Explanation;Demonstration   Comprehension Verbalized understanding;Returned demonstration          PT Short Term Goals - 08/14/14 1453    PT SHORT TERM GOAL #1   Title Patient to be independent with initial HEP.  07/26/14   Time 3   Period Weeks   Status On-going   PT SHORT TERM GOAL #2   Title Patient to report back pain centralized 75% of the time.  07/26/14   Time 3   Period Weeks   Status On-going           PT Long Term Goals - 08/14/14 1454    PT LONG TERM GOAL #1   Title Patient to demonstrte or verbalize techniques to reduce the risk of re-injury to include info on posture, body mechanics, lifting.  09/13/14   Time 6   Period Weeks   PT LONG TERM GOAL #2   Title Patient to be independent with advanced HEP for LE and core strength and conditioning.  09/13/14   Time 6   Period Weeks   PT LONG TERM GOAL #3   Title Patient to report pain decrease to no greater than 2-3/10 with home and work activities.  09/13/14   Time 6   Period Weeks   PT LONG TERM GOAL #4   Title Patient to demonstrate right hip strength equal left.  09/13/14   Time 6   Period Weeks   PT LONG TERM GOAL #5   Title Patient to report radicular symptoms eliminated in right LE.  09/13/14   Time 6   Period Weeks               Plan - 08/14/14 1545    Clinical Impression Statement Patient no better since starting PT, but hasn't been able to do HEP due to infant son "beats up on me" when trying to do HEP.  Encouraged to perform even if only 3 days a week when he is asleep. Missed 2 weeks due to child's illness. Patient may benefit from continuing skilled PT to address deficits and progress to goals.    Pt will benefit from skilled therapeutic intervention in order to improve on the following deficits Impaired flexibility;Improper body mechanics;Pain;Decreased strength   Rehab Potential Good   PT  Frequency 2x / week   PT Duration 4 weeks   PT  Treatment/Interventions ADLs/Self Care Home Management;Traction;Moist Heat;Manual techniques;Electrical Stimulation;Therapeutic exercise;Cryotherapy;Therapeutic activities;Passive range of motion;Patient/family education;Functional mobility training;Ultrasound   PT Next Visit Plan Reinforce current HEP, if doing add hip strength HEP; reinforce/review lifting/posture/body mechanics for work techniques   Consulted and Agree with Plan of Care Patient        Problem List Patient Active Problem List   Diagnosis Date Noted  . Abdominal pain 06/17/2014  . Tobacco abuse 06/17/2014  . Vomiting 06/16/2014  . Back pain 05/21/2014  . Neck pain 05/21/2014  . Thoracic back pain 05/21/2014  . ADD (attention deficit disorder) 04/01/2014  . Eye pressure 03/14/2014  . Blurred vision, left eye 03/14/2014  . Urinary tract infection, site not specified 02/28/2014  . Gallstones 01/05/2014  . Cholecystitis 01/05/2014  . Biliary dyskinesia 01/01/2014  . SVD (spontaneous vaginal delivery) 08/19/2013  . Indication for care in labor or delivery 08/18/2013  . Tick bite 02/28/2012  . Abscess 02/28/2012  . Boil 10/26/2011  . Bipolar 1 disorder 10/15/2011  . Head injury 09/28/2010  . CRUSHING INJURY OF HAND 02/10/2010  . EYE PAIN, LEFT 12/04/2009  . MOOD SWINGS 11/28/2009  . ARTHRITIS, BACK 10/02/2009  . ANXIETY STATE, UNSPECIFIED 09/23/2009  . KNEE PAIN, RIGHT 02/21/2009  . ANKLE PAIN, RIGHT 02/21/2009  . NAUSEA AND VOMITING 10/16/2008  . NEPHROLITHIASIS, HX OF 05/28/2008  . GASTROINTESTINAL XRAY, ABNORMAL 04/04/2008  . SYMPTOM, PAIN, ABDOMINAL, RIGHT UP QUADRANT 03/08/2007  . FLANK PAIN, RIGHT 03/08/2007  . BASEDOW'S DISEASE 03/06/2007  . PYELONEPHRITIS NOS 02/17/2007  . FATIGUE 02/17/2007  . OTITIS EXTERNA, ACUTE, LEFT 12/06/2006  . GERD 12/06/2006  . BACK PAIN 12/06/2006    [facility] 08/14/2014, 3:53 PM  [facility] [address]  [place], [place], [address] Phone: [phone]   Fax:  [phone]   PHYSICAL THERAPY DISCHARGE SUMMARY  Visits from Start of Care: 5  Current functional level related to goals / functional outcomes: [name] did not make progress during her PT sessions.  She had reported low compliance and poor attendance.     Remaining deficits: Continued pain   Education / Equipment: HEP Plan: Patient agrees to discharge.  Patient goals were not met. Patient is being discharged due to not returning since the last visit.  ?????     Shan Levans, DPT, CMP

## 2014-08-14 NOTE — Patient Instructions (Signed)
Basic review of current HEP and encouraged participation at home at least 3 x/week.  Discussed renewal and need to make modifications to lifting as practiced and do HEP to expect any improvements.

## 2014-08-26 ENCOUNTER — Telehealth: Payer: Self-pay | Admitting: Family Medicine

## 2014-08-26 DIAGNOSIS — M545 Low back pain: Secondary | ICD-10-CM

## 2014-08-26 NOTE — Telephone Encounter (Signed)
Ref placed.      KP 

## 2014-08-26 NOTE — Telephone Encounter (Signed)
Caller name: velmenia from [facility] [phone] Relation to pt: Call back number: Pharmacy:  Reason for call:   velmenia states that patient is going to [facility] chiropractor and needs referral. P: [phone] F: [phone]  Has seen [facility] and they think this will be beneficial for patient.

## 2014-08-28 ENCOUNTER — Ambulatory Visit: Payer: 59 | Admitting: Rehabilitation

## 2014-08-29 ENCOUNTER — Telehealth: Payer: Self-pay | Admitting: Family Medicine

## 2014-08-29 DIAGNOSIS — F988 Other specified behavioral and emotional disorders with onset usually occurring in childhood and adolescence: Secondary | ICD-10-CM

## 2014-08-29 NOTE — Telephone Encounter (Signed)
Auth # is [id number], valid through 08/26/14-02/26/15 with 6 visiti, [place] aware

## 2014-08-29 NOTE — Telephone Encounter (Signed)
Caller name:Jambor Brenly Relation to TH:YHOO Call back [phone] Pharmacy:  Reason for call: pt is needing rx amphetamine-dextroamphetamine (ADDERALL) 30 MG tablet , please call when available for pick up

## 2014-08-29 NOTE — Telephone Encounter (Signed)
Caller name: Anderson Malta Relation to pt: Call back number: [phone] Pharmacy:  Reason for call:   Anderson Malta from [place] chiropractor called needing to talk to you about patient authorization for them to see patient. Has [employer]. Anderson Malta says that they were only authorized for one visit??

## 2014-08-29 NOTE — Telephone Encounter (Signed)
Last seen 06/24/14 and filled 08/02/14 #30 UDS 05/01/14  Low risk   Please advise      KP

## 2014-08-30 MED ORDER — AMPHETAMINE-DEXTROAMPHETAMINE 30 MG PO TABS: 30.0000 mg | ORAL_TABLET | Freq: Every day | ORAL | Status: DC

## 2014-08-30 NOTE — Telephone Encounter (Signed)
Ok to fill 

## 2014-08-30 NOTE — Telephone Encounter (Signed)
March, April and May filled and the patient is aware med's are ready for pick up.     KP

## 2014-09-03 ENCOUNTER — Telehealth: Payer: Self-pay | Admitting: Medical

## 2014-09-03 NOTE — Telephone Encounter (Signed)
Opened to investigate partial done letter in my mailbox regarding this pt.

## 2014-09-04 ENCOUNTER — Ambulatory Visit: Payer: 59 | Admitting: Rehabilitation

## 2014-09-11 ENCOUNTER — Ambulatory Visit: Payer: 59 | Admitting: Rehabilitation

## 2014-09-15 ENCOUNTER — Encounter (HOSPITAL_BASED_OUTPATIENT_CLINIC_OR_DEPARTMENT_OTHER): Payer: Self-pay | Admitting: *Deleted

## 2014-09-15 ENCOUNTER — Emergency Department (HOSPITAL_BASED_OUTPATIENT_CLINIC_OR_DEPARTMENT_OTHER)
Admission: EM | Admit: 2014-09-15 | Discharge: 2014-09-15 | Disposition: A | Discharge status: Home / Self Care | Payer: 59 | Attending: Emergency Medicine | Admitting: Emergency Medicine

## 2014-09-15 DIAGNOSIS — Z8639 Personal history of other endocrine, nutritional and metabolic disease: Secondary | ICD-10-CM | POA: Diagnosis not present

## 2014-09-15 DIAGNOSIS — R1013 Epigastric pain: Secondary | ICD-10-CM | POA: Diagnosis present

## 2014-09-15 DIAGNOSIS — G8929 Other chronic pain: Secondary | ICD-10-CM | POA: Insufficient documentation

## 2014-09-15 DIAGNOSIS — Z8742 Personal history of other diseases of the female genital tract: Secondary | ICD-10-CM | POA: Insufficient documentation

## 2014-09-15 DIAGNOSIS — K219 Gastro-esophageal reflux disease without esophagitis: Secondary | ICD-10-CM | POA: Insufficient documentation

## 2014-09-15 DIAGNOSIS — Z8739 Personal history of other diseases of the musculoskeletal system and connective tissue: Secondary | ICD-10-CM | POA: Diagnosis not present

## 2014-09-15 DIAGNOSIS — N390 Urinary tract infection, site not specified: Secondary | ICD-10-CM

## 2014-09-15 DIAGNOSIS — Z720 Tobacco use: Secondary | ICD-10-CM | POA: Diagnosis not present

## 2014-09-15 DIAGNOSIS — F909 Attention-deficit hyperactivity disorder, unspecified type: Secondary | ICD-10-CM | POA: Insufficient documentation

## 2014-09-15 DIAGNOSIS — R319 Hematuria, unspecified: Secondary | ICD-10-CM

## 2014-09-15 DIAGNOSIS — Z3202 Encounter for pregnancy test, result negative: Secondary | ICD-10-CM | POA: Diagnosis not present

## 2014-09-15 DIAGNOSIS — Z79899 Other long term (current) drug therapy: Secondary | ICD-10-CM | POA: Insufficient documentation

## 2014-09-15 LAB — CBC WITH DIFFERENTIAL/PLATELET
BASOS ABS: 0 10*3/uL (ref 0.0–0.1)
Basophils Relative: 0 % (ref 0–1)
Eosinophils Absolute: 0.2 10*3/uL (ref 0.0–0.7)
Eosinophils Relative: 2 % (ref 0–5)
HEMATOCRIT: 41.8 % (ref 36.0–46.0)
Hemoglobin: 13.5 g/dL (ref 12.0–15.0)
LYMPHS PCT: 18 % (ref 12–46)
Lymphs Abs: 1.5 10*3/uL (ref 0.7–4.0)
MCH: 27.6 pg (ref 26.0–34.0)
MCHC: 32.3 g/dL (ref 30.0–36.0)
MCV: 85.3 fL (ref 78.0–100.0)
Monocytes Absolute: 0.6 10*3/uL (ref 0.1–1.0)
Monocytes Relative: 7 % (ref 3–12)
NEUTROS PCT: 73 % (ref 43–77)
Neutro Abs: 5.8 10*3/uL (ref 1.7–7.7)
Platelets: 210 10*3/uL (ref 150–400)
RBC: 4.9 MIL/uL (ref 3.87–5.11)
RDW: 13.4 % (ref 11.5–15.5)
WBC: 8 10*3/uL (ref 4.0–10.5)

## 2014-09-15 LAB — URINALYSIS, ROUTINE W REFLEX MICROSCOPIC
Bilirubin Urine: NEGATIVE
Glucose, UA: NEGATIVE mg/dL
Ketones, ur: NEGATIVE mg/dL
Nitrite: NEGATIVE
Protein, ur: NEGATIVE mg/dL
Specific Gravity, Urine: 1.018 (ref 1.005–1.030)
Urobilinogen, UA: 0.2 mg/dL (ref 0.0–1.0)
pH: 6 (ref 5.0–8.0)

## 2014-09-15 LAB — URINE MICROSCOPIC-ADD ON

## 2014-09-15 LAB — COMPREHENSIVE METABOLIC PANEL
ALT: 16 U/L (ref 0–35)
ANION GAP: 9 (ref 5–15)
AST: 17 U/L (ref 0–37)
Albumin: 3.9 g/dL (ref 3.5–5.2)
Alkaline Phosphatase: 39 U/L (ref 39–117)
BUN: 7 mg/dL (ref 6–23)
CO2: 26 mmol/L (ref 19–32)
CREATININE: 0.73 mg/dL (ref 0.50–1.10)
Calcium: 9.1 mg/dL (ref 8.4–10.5)
Chloride: 104 mmol/L (ref 96–112)
GFR calc Af Amer: >90 mL/min (ref >90)
Glucose, Bld: 106 mg/dL — ABNORMAL HIGH (ref 70–99)
POTASSIUM: 3.7 mmol/L (ref 3.5–5.1)
SODIUM: 139 mmol/L (ref 135–145)
Total Bilirubin: <0.1 mg/dL — ABNORMAL LOW (ref 0.3–1.2)
Total Protein: 7.3 g/dL (ref 6.0–8.3)

## 2014-09-15 LAB — PREGNANCY, URINE: PREG TEST UR: NEGATIVE

## 2014-09-15 LAB — LIPASE, BLOOD: Lipase: 24 U/L (ref 11–59)

## 2014-09-15 MED ORDER — METOCLOPRAMIDE HCL 5 MG/ML IJ SOLN
10.0000 mg | Freq: Once | INTRAMUSCULAR | Status: AC
  Administered 2014-09-15: 10 mg via INTRAVENOUS
  Filled 2014-09-15: qty 2

## 2014-09-15 MED ORDER — CEFTRIAXONE SODIUM 1 G IJ SOLR
INTRAMUSCULAR | Status: AC
  Filled 2014-09-15: qty 10

## 2014-09-15 MED ORDER — ONDANSETRON HCL 4 MG PO TABS: 4.0000 mg | ORAL_TABLET | Freq: Four times a day (QID) | ORAL | Status: DC

## 2014-09-15 MED ORDER — MORPHINE SULFATE 4 MG/ML IJ SOLN
4.0000 mg | Freq: Once | INTRAMUSCULAR | Status: AC
  Administered 2014-09-15: 4 mg via INTRAVENOUS
  Filled 2014-09-15: qty 1

## 2014-09-15 MED ORDER — DEXTROSE 5 % IV SOLN
1.0000 g | INTRAVENOUS | Status: DC
  Administered 2014-09-15: 1 g via INTRAVENOUS

## 2014-09-15 MED ORDER — SODIUM CHLORIDE 0.9 % IV BOLUS (SEPSIS)
1000.0000 mL | Freq: Once | INTRAVENOUS | Status: AC
  Administered 2014-09-15: 1000 mL via INTRAVENOUS

## 2014-09-15 MED ORDER — GI COCKTAIL ~~LOC~~
30.0000 mL | Freq: Once | ORAL | Status: AC
  Administered 2014-09-15: 30 mL via ORAL
  Filled 2014-09-15: qty 30

## 2014-09-15 MED ORDER — ONDANSETRON HCL 4 MG/2ML IJ SOLN
4.0000 mg | Freq: Once | INTRAMUSCULAR | Status: AC
  Administered 2014-09-15: 4 mg via INTRAVENOUS
  Filled 2014-09-15: qty 2

## 2014-09-15 MED ORDER — CEPHALEXIN 500 MG PO CAPS: 500.0000 mg | ORAL_CAPSULE | Freq: Three times a day (TID) | ORAL | Status: DC

## 2014-09-15 NOTE — ED Notes (Signed)
Pt place on cardiac monitor.

## 2014-09-15 NOTE — Discharge Instructions (Signed)

## 2014-09-15 NOTE — ED Provider Notes (Signed)
CSN: [id number]     Arrival date & time 09/15/14  0915 History   First MD Initiated Contact with Patient 09/15/14 [phone]     Chief Complaint  Patient presents with  . Abdominal Pain     (Consider location/radiation/quality/duration/timing/severity/associated sxs/prior Treatment) Patient is a 29 y.o. female presenting with abdominal pain.  Abdominal Pain Pain location:  RUQ and epigastric Pain quality: sharp   Pain radiates to:  Does not radiate Pain severity:  Severe Onset quality:  Gradual Duration:  2 days Timing:  Intermittent Progression:  Worsening Chronicity:  Recurrent (felt similarly in january when she was admitted. ) Relieved by:  Nothing Worsened by:  Nothing tried Ineffective treatments: protonix. Associated symptoms: diarrhea, dysuria, nausea and vomiting   Associated symptoms: no chest pain, no constipation, no cough, no fever, no hematuria, no shortness of breath, no vaginal bleeding and no vaginal discharge     Past Medical History  Diagnosis Date  . Gastric reflux   . Anxiety   . Basedow's disease   . Pyelonephritis   . History of nephrolithiasis   . Anxiety disorder   . Bipolar disorder   . Interstitial cystitis   . Chronic back pain   . Fibroid   . Barrett esophagus   . ADD (attention deficit disorder)   . Back pain    Past Surgical History  Procedure Laterality Date  . Tonsillectomy    . Adenoidectomy    . Tear duct probing    . Cholecystectomy N/A 01/05/2014    Procedure: LAPAROSCOPIC CHOLECYSTECTOMY WITH INTRAOPERATIVE CHOLANGIOGRAM;  Surgeon: Edward Jolly, MD;  Location: [facility];  Service: General;  Laterality: N/A;  . Esophagogastroduodenoscopy (egd) with propofol N/A 06/18/2014    Procedure: ESOPHAGOGASTRODUODENOSCOPY (EGD) WITH PROPOFOL;  Surgeon: Wonda Horner, MD;  Location: [facility] ENDOSCOPY;  Service: Endoscopy;  Laterality: N/A;   Family History  Problem Relation Age of Onset  . Diabetes    . Hypertension    . Lung cancer    .  Bipolar disorder Sister    History  Substance Use Topics  . Smoking status: Current Some Day Smoker -- 0.50 packs/day for 8 years    Types: Cigarettes  . Smokeless tobacco: Never Used  . Alcohol Use: No   OB History    Gravida Para Term Preterm AB TAB SAB Ectopic Multiple Living   1 1 1       1      Review of Systems  Constitutional: Negative for fever.  Respiratory: Negative for cough and shortness of breath.   Cardiovascular: Negative for chest pain.  Gastrointestinal: Positive for nausea, vomiting, abdominal pain and diarrhea. Negative for constipation.  Genitourinary: Positive for dysuria. Negative for hematuria, vaginal bleeding and vaginal discharge.  All other systems reviewed and are negative.     Allergies  Review of patient's allergies indicates no known allergies.  Home Medications   Prior to Admission medications   Medication Sig Start Date End Date Taking? Authorizing Provider  amphetamine-dextroamphetamine (ADDERALL) 30 MG tablet Take 1 tablet by mouth daily. 08/30/14  Yes Rosalita Chessman, DO  levonorgestrel-ethinyl estradiol (AVIANE,ALESSE,LESSINA) 0.1-20 MG-MCG tablet Take 1 tablet by mouth daily.   Yes Historical Provider, MD  oxyCODONE (OXY IR/ROXICODONE) 5 MG immediate release tablet Take 1 tablet (5 mg total) by mouth every 6 (six) hours as needed for moderate pain. 06/19/14  Yes Shanker Kristeen Mans, MD  pantoprazole (PROTONIX) 40 MG tablet Take 1 tablet (40 mg total) by mouth daily at 12 noon.  06/24/14  Yes Yvonne R Lowne, DO  amphetamine-dextroamphetamine (ADDERALL) 30 MG tablet Take 1 tablet by mouth daily. 08/30/14   Rosalita Chessman, DO  amphetamine-dextroamphetamine (ADDERALL) 30 MG tablet Take 1 tablet by mouth daily. 08/30/14   Rosalita Chessman, DO  doxycycline (VIBRAMYCIN) 100 MG capsule Take 1 capsule (100 mg total) by mouth 2 (two) times daily. Take with food. 07/22/14   Kandra Nicolas, MD  metaxalone (SKELAXIN) 800 MG tablet Take 1 tablet (800 mg total) by  mouth 3 (three) times daily. Patient taking differently: Take 800 mg by mouth daily as needed for muscle spasms.  06/04/14   Rosalita Chessman, DO  ondansetron (ZOFRAN) 4 MG tablet Take 1 tablet (4 mg total) by mouth every 6 (six) hours as needed for nausea or vomiting. 06/24/14   Alferd Apa Lowne, DO   BP 129/86 mmHg  Pulse 80  Temp(Src) 98.2 F (36.8 C) (Oral)  Resp 18  Ht 5\' 6"  (1.676 m)  Wt 150 lb (68.04 kg)  BMI 24.22 kg/m2  SpO2 100%  LMP 08/25/2014 Physical Exam  Constitutional: She is oriented to person, place, and time. She appears well-developed and well-nourished. No distress.  HENT:  Head: Normocephalic and atraumatic.  Mouth/Throat: Oropharynx is clear and moist.  Eyes: Conjunctivae are normal. Pupils are equal, round, and reactive to light. No scleral icterus.  Neck: Neck supple.  Cardiovascular: Normal rate, regular rhythm, normal heart sounds and intact distal pulses.   No murmur heard. Pulmonary/Chest: Effort normal and breath sounds normal. No stridor. No respiratory distress. She has no rales.  Abdominal: Soft. Bowel sounds are normal. She exhibits no distension. There is tenderness in the right upper quadrant and epigastric area. There is no rigidity, no rebound, no guarding and no CVA tenderness.  Musculoskeletal: Normal range of motion.  Neurological: She is alert and oriented to person, place, and time.  Skin: Skin is warm and dry. No rash noted.  Psychiatric: She has a normal mood and affect. Her behavior is normal.  Nursing note and vitals reviewed.   ED Course  Procedures (including critical care time) Labs Review Labs Reviewed  URINALYSIS, ROUTINE W REFLEX MICROSCOPIC - Abnormal; Notable for the following:    APPearance CLOUDY (*)    Hgb urine dipstick SMALL (*)    Leukocytes, UA SMALL (*)    All other components within normal limits  COMPREHENSIVE METABOLIC PANEL - Abnormal; Notable for the following:    Glucose, Bld 106 (*)    Total Bilirubin <0.1 (*)     All other components within normal limits  URINE MICROSCOPIC-ADD ON - Abnormal; Notable for the following:    Squamous Epithelial / LPF FEW (*)    Bacteria, UA MANY (*)    All other components within normal limits  URINE CULTURE  PREGNANCY, URINE  CBC WITH DIFFERENTIAL/PLATELET  LIPASE, BLOOD    Imaging Review No results found.   EKG Interpretation None      MDM   Final diagnoses:  Urinary tract infection with hematuria, site unspecified    Well appearing 29 yo female with RUQ abdominal pain.  History of cholecystectomy.    2:43 PM feels a little better. UA shows mild hematuria but significant bacteriuria. Patient has had dysuria. I think this likely represents urinary tract infection. Plan to treat with ceftriaxone IV in the ED and discharged with Keflex. Patient will follow-up closely with her primary doctor. Otherwise, labs unremarkable.  Serita Grit, MD 09/15/14 1444

## 2014-09-15 NOTE — ED Notes (Signed)
C/o right upper abd pain, n/v/d. Onset yesterday.States she has hx of UTI and feels like she may have one now. Admitted for same ?2 months ago unsure of dx at that time.

## 2014-09-16 LAB — URINE CULTURE: Colony Count: 40000

## 2014-09-23 ENCOUNTER — Emergency Department (HOSPITAL_COMMUNITY)
Admission: EM | Admit: 2014-09-23 | Discharge: 2014-09-23 | Disposition: A | Discharge status: Home / Self Care | Payer: 59 | Attending: Emergency Medicine | Admitting: Emergency Medicine

## 2014-09-23 ENCOUNTER — Ambulatory Visit: Payer: 59 | Admitting: Family Medicine

## 2014-09-23 ENCOUNTER — Emergency Department (HOSPITAL_COMMUNITY): Payer: 59

## 2014-09-23 ENCOUNTER — Encounter (HOSPITAL_COMMUNITY): Payer: Self-pay | Admitting: *Deleted

## 2014-09-23 DIAGNOSIS — Z8639 Personal history of other endocrine, nutritional and metabolic disease: Secondary | ICD-10-CM | POA: Diagnosis not present

## 2014-09-23 DIAGNOSIS — R110 Nausea: Secondary | ICD-10-CM | POA: Diagnosis not present

## 2014-09-23 DIAGNOSIS — R109 Unspecified abdominal pain: Secondary | ICD-10-CM

## 2014-09-23 DIAGNOSIS — Z3202 Encounter for pregnancy test, result negative: Secondary | ICD-10-CM | POA: Insufficient documentation

## 2014-09-23 DIAGNOSIS — Z9889 Other specified postprocedural states: Secondary | ICD-10-CM | POA: Diagnosis not present

## 2014-09-23 DIAGNOSIS — F909 Attention-deficit hyperactivity disorder, unspecified type: Secondary | ICD-10-CM | POA: Insufficient documentation

## 2014-09-23 DIAGNOSIS — Z87442 Personal history of urinary calculi: Secondary | ICD-10-CM | POA: Insufficient documentation

## 2014-09-23 DIAGNOSIS — Z792 Long term (current) use of antibiotics: Secondary | ICD-10-CM | POA: Insufficient documentation

## 2014-09-23 DIAGNOSIS — Z79899 Other long term (current) drug therapy: Secondary | ICD-10-CM | POA: Diagnosis not present

## 2014-09-23 DIAGNOSIS — Z9049 Acquired absence of other specified parts of digestive tract: Secondary | ICD-10-CM | POA: Diagnosis not present

## 2014-09-23 DIAGNOSIS — Z87448 Personal history of other diseases of urinary system: Secondary | ICD-10-CM | POA: Diagnosis not present

## 2014-09-23 DIAGNOSIS — K219 Gastro-esophageal reflux disease without esophagitis: Secondary | ICD-10-CM | POA: Diagnosis not present

## 2014-09-23 DIAGNOSIS — G8929 Other chronic pain: Secondary | ICD-10-CM | POA: Diagnosis not present

## 2014-09-23 DIAGNOSIS — Z720 Tobacco use: Secondary | ICD-10-CM | POA: Diagnosis not present

## 2014-09-23 LAB — URINALYSIS, ROUTINE W REFLEX MICROSCOPIC
Bilirubin Urine: NEGATIVE
Glucose, UA: NEGATIVE mg/dL
Ketones, ur: 40 mg/dL — AB
Leukocytes, UA: NEGATIVE
Nitrite: NEGATIVE
PROTEIN: NEGATIVE mg/dL
Specific Gravity, Urine: 1.023 (ref 1.005–1.030)
UROBILINOGEN UA: 0.2 mg/dL (ref 0.0–1.0)
pH: 7 (ref 5.0–8.0)

## 2014-09-23 LAB — COMPREHENSIVE METABOLIC PANEL
ALBUMIN: 4.5 g/dL (ref 3.5–5.2)
ALT: 18 U/L (ref 0–35)
ANION GAP: 12 (ref 5–15)
AST: 19 U/L (ref 0–37)
Alkaline Phosphatase: 44 U/L (ref 39–117)
BUN: 8 mg/dL (ref 6–23)
CALCIUM: 9.8 mg/dL (ref 8.4–10.5)
CO2: 25 mmol/L (ref 19–32)
CREATININE: 0.61 mg/dL (ref 0.50–1.10)
Chloride: 101 mmol/L (ref 96–112)
GFR calc Af Amer: >90 mL/min (ref >90)
GFR calc non Af Amer: >90 mL/min (ref >90)
Glucose, Bld: 100 mg/dL — ABNORMAL HIGH (ref 70–99)
Potassium: 3.7 mmol/L (ref 3.5–5.1)
Sodium: 138 mmol/L (ref 135–145)
Total Bilirubin: 0.6 mg/dL (ref 0.3–1.2)
Total Protein: 8 g/dL (ref 6.0–8.3)

## 2014-09-23 LAB — CBC WITH DIFFERENTIAL/PLATELET
Basophils Absolute: 0 10*3/uL (ref 0.0–0.1)
Basophils Relative: 0 % (ref 0–1)
Eosinophils Absolute: 0.1 10*3/uL (ref 0.0–0.7)
Eosinophils Relative: 1 % (ref 0–5)
HEMATOCRIT: 43.5 % (ref 36.0–46.0)
HEMOGLOBIN: 14.4 g/dL (ref 12.0–15.0)
LYMPHS ABS: 2.4 10*3/uL (ref 0.7–4.0)
LYMPHS PCT: 23 % (ref 12–46)
MCH: 28.4 pg (ref 26.0–34.0)
MCHC: 33.1 g/dL (ref 30.0–36.0)
MCV: 85.8 fL (ref 78.0–100.0)
MONO ABS: 0.7 10*3/uL (ref 0.1–1.0)
Monocytes Relative: 7 % (ref 3–12)
Neutro Abs: 7.3 10*3/uL (ref 1.7–7.7)
Neutrophils Relative %: 69 % (ref 43–77)
Platelets: 244 10*3/uL (ref 150–400)
RBC: 5.07 MIL/uL (ref 3.87–5.11)
RDW: 13.4 % (ref 11.5–15.5)
WBC: 10.5 10*3/uL (ref 4.0–10.5)

## 2014-09-23 LAB — URINE MICROSCOPIC-ADD ON

## 2014-09-23 LAB — PREGNANCY, URINE: Preg Test, Ur: NEGATIVE

## 2014-09-23 MED ORDER — MORPHINE SULFATE 4 MG/ML IJ SOLN
4.0000 mg | Freq: Once | INTRAMUSCULAR | Status: AC
  Administered 2014-09-23: 4 mg via INTRAVENOUS
  Filled 2014-09-23: qty 1

## 2014-09-23 MED ORDER — ONDANSETRON HCL 4 MG/2ML IJ SOLN
4.0000 mg | Freq: Once | INTRAMUSCULAR | Status: AC
  Administered 2014-09-23: 4 mg via INTRAVENOUS
  Filled 2014-09-23: qty 2

## 2014-09-23 MED ORDER — ONDANSETRON HCL 8 MG PO TABS: 8.0000 mg | ORAL_TABLET | Freq: Three times a day (TID) | ORAL | Status: DC | PRN

## 2014-09-23 MED ORDER — ONDANSETRON 8 MG PO TBDP
8.0000 mg | ORAL_TABLET | Freq: Once | ORAL | Status: AC
  Administered 2014-09-23: 8 mg via ORAL
  Filled 2014-09-23: qty 1

## 2014-09-23 MED ORDER — HYDROCODONE-ACETAMINOPHEN 5-325 MG PO TABS
1.0000 | ORAL_TABLET | Freq: Once | ORAL | Status: AC
  Administered 2014-09-23: 1 via ORAL
  Filled 2014-09-23: qty 1

## 2014-09-23 NOTE — Discharge Instructions (Signed)
Take your hydrocodone as needed for pain. You've been prescribed additional medicine for nausea. Keep your scheduled appointment with Dr.Lowne tomorrow 11:30 AM. Please tell her that you were seen here and had testing done here.

## 2014-09-23 NOTE — ED Notes (Signed)
Pt requesting more pain medication. Will make MD aware.

## 2014-09-23 NOTE — ED Notes (Signed)
MD at bedside. 

## 2014-09-23 NOTE — ED Notes (Addendum)
Per ems pt was seen and treated in ED on 4/3, dx with kidney infection and is on day 8 of keflex. Reports flank/back pain, pain has not gone away since being dx with infection.  Pt takes hydrocodone at night, does not take it during the day because she works. Pain 10/10.

## 2014-09-23 NOTE — ED Provider Notes (Signed)
CSN: [id number]     Arrival date & time 09/23/14  1336 History   First MD Initiated Contact with Patient 09/23/14 1646     Chief Complaint  Patient presents with  . Flank Pain     (Consider location/radiation/quality/duration/timing/severity/associated sxs/prior Treatment) Patient is a 29 y.o. female presenting with flank pain.  Flank Pain   Presents with right flank pain, nonradiating onset today, coming by nausea. She denies abdominal pain. No fever. Maximum temperature 99 degrees. Pain is worse with pressing on the area not improved by anything. Last normal menstrual period started yesterday. No other associated symptoms. Patient seen at [facility] on 09/15/2014 diagnosed with "kidney infection "treated with Keflex. Urine culture shows multiple species. She was treated with Keflex. No other associated symptoms. Past Medical History  Diagnosis Date  . Gastric reflux   . Anxiety   . Basedow's disease   . Pyelonephritis   . History of nephrolithiasis   . Anxiety disorder   . Bipolar disorder   . Interstitial cystitis   . Chronic back pain   . Fibroid   . Barrett esophagus   . ADD (attention deficit disorder)   . Back pain    Past Surgical History  Procedure Laterality Date  . Tonsillectomy    . Adenoidectomy    . Tear duct probing    . Cholecystectomy N/A 01/05/2014    Procedure: LAPAROSCOPIC CHOLECYSTECTOMY WITH INTRAOPERATIVE CHOLANGIOGRAM;  Surgeon: Edward Jolly, MD;  Location: [facility];  Service: General;  Laterality: N/A;  . Esophagogastroduodenoscopy (egd) with propofol N/A 06/18/2014    Procedure: ESOPHAGOGASTRODUODENOSCOPY (EGD) WITH PROPOFOL;  Surgeon: Wonda Horner, MD;  Location: [facility] ENDOSCOPY;  Service: Endoscopy;  Laterality: N/A;   Family History  Problem Relation Age of Onset  . Diabetes    . Hypertension    . Lung cancer    . Bipolar disorder Sister    History  Substance Use Topics  . Smoking status: Current Some Day Smoker -- 0.50 packs/day  for 8 years    Types: Cigarettes  . Smokeless tobacco: Never Used  . Alcohol Use: No   OB History    Gravida Para Term Preterm AB TAB SAB Ectopic Multiple Living   1 1 1       1      Review of Systems  Constitutional: Negative.   HENT: Negative.   Respiratory: Negative.   Cardiovascular: Negative.   Gastrointestinal: Positive for nausea.  Genitourinary: Positive for flank pain.  Musculoskeletal: Negative.   Skin: Negative.   Neurological: Negative.   Psychiatric/Behavioral: Negative.   All other systems reviewed and are negative.     Allergies  Review of patient's allergies indicates no known allergies.  Home Medications   Prior to Admission medications   Medication Sig Start Date End Date Taking? Authorizing Provider  amphetamine-dextroamphetamine (ADDERALL) 30 MG tablet Take 1 tablet by mouth daily. 08/30/14  Yes Yvonne R Lowne, DO  cephALEXin (KEFLEX) 500 MG capsule Take 1 capsule (500 mg total) by mouth 3 (three) times daily. 09/15/14  Yes Serita Grit, MD  HYDROcodone-acetaminophen (NORCO) 10-325 MG per tablet Take 1 tablet by mouth every 6 (six) hours as needed for moderate pain or severe pain (pain).   Yes Historical Provider, MD  levonorgestrel-ethinyl estradiol (AVIANE,ALESSE,LESSINA) 0.1-20 MG-MCG tablet Take 1 tablet by mouth daily.   Yes Historical Provider, MD  pantoprazole (PROTONIX) 40 MG tablet Take 1 tablet (40 mg total) by mouth daily at 12 noon. 06/24/14  Yes Alferd Apa  Lowne, DO  amphetamine-dextroamphetamine (ADDERALL) 30 MG tablet Take 1 tablet by mouth daily. Patient not taking: Reported on 09/23/2014 08/30/14   Rosalita Chessman, DO  amphetamine-dextroamphetamine (ADDERALL) 30 MG tablet Take 1 tablet by mouth daily. Patient not taking: Reported on 09/23/2014 08/30/14   Rosalita Chessman, DO  doxycycline (VIBRAMYCIN) 100 MG capsule Take 1 capsule (100 mg total) by mouth 2 (two) times daily. Take with food. Patient not taking: Reported on 09/23/2014 07/22/14   Kandra Nicolas, MD  metaxalone (SKELAXIN) 800 MG tablet Take 1 tablet (800 mg total) by mouth 3 (three) times daily. Patient taking differently: Take 800 mg by mouth daily as needed for muscle spasms.  06/04/14   Alferd Apa Lowne, DO  ondansetron (ZOFRAN) 4 MG tablet Take 1 tablet (4 mg total) by mouth every 6 (six) hours. 09/15/14   Serita Grit, MD  oxyCODONE (OXY IR/ROXICODONE) 5 MG immediate release tablet Take 1 tablet (5 mg total) by mouth every 6 (six) hours as needed for moderate pain. Patient not taking: Reported on 09/23/2014 06/19/14   Jonetta Osgood, MD   BP 122/83 mmHg  Pulse 93  Temp(Src) 98 F (36.7 C) (Oral)  Resp 15  SpO2 100%  LMP 08/25/2014 Physical Exam  Constitutional: She appears well-developed and well-nourished.  HENT:  Head: Normocephalic and atraumatic.  Eyes: Conjunctivae are normal. Pupils are equal, round, and reactive to light.  Neck: Neck supple. No tracheal deviation present. No thyromegaly present.  Cardiovascular: Normal rate and regular rhythm.   No murmur heard. Pulmonary/Chest: Effort normal and breath sounds normal.  Abdominal: Soft. Bowel sounds are normal. She exhibits no distension. There is no tenderness.  Genitourinary:  Right flank tenderness  Musculoskeletal: Normal range of motion. She exhibits no edema or tenderness.  Neurological: She is alert. Coordination normal.  Skin: Skin is warm and dry. No rash noted.  Psychiatric: She has a normal mood and affect.  Nursing note and vitals reviewed.   ED Course  Procedures (including critical care time) Labs Review Labs Reviewed  COMPREHENSIVE METABOLIC PANEL - Abnormal; Notable for the following:    Glucose, Bld 100 (*)    All other components within normal limits  URINALYSIS, ROUTINE W REFLEX MICROSCOPIC - Abnormal; Notable for the following:    APPearance CLOUDY (*)    Hgb urine dipstick SMALL (*)    Ketones, ur 40 (*)    All other components within normal limits  URINE MICROSCOPIC-ADD ON -  Abnormal; Notable for the following:    Bacteria, UA FEW (*)    All other components within normal limits  CBC WITH DIFFERENTIAL/PLATELET    Imaging Review No results found.   EKG Interpretation None     7:35 PM pain and nausea was improved after treatment with intravenous opioids and antiemetics, however requesting more pain medicine and antiemetic. Norco, Zofran ordered orally. Results for orders placed or performed during the hospital encounter of 09/23/14  Comprehensive metabolic panel  Result Value Ref Range   Sodium 138 135 - 145 mmol/L   Potassium 3.7 3.5 - 5.1 mmol/L   Chloride 101 96 - 112 mmol/L   CO2 25 19 - 32 mmol/L   Glucose, Bld 100 (H) 70 - 99 mg/dL   BUN 8 6 - 23 mg/dL   Creatinine, Ser 0.61 0.50 - 1.10 mg/dL   Calcium 9.8 8.4 - 10.5 mg/dL   Total Protein 8.0 6.0 - 8.3 g/dL   Albumin 4.5 3.5 - 5.2 g/dL  AST 19 0 - 37 U/L   ALT 18 0 - 35 U/L   Alkaline Phosphatase 44 39 - 117 U/L   Total Bilirubin 0.6 0.3 - 1.2 mg/dL   GFR calc non Af Amer >90 >90 mL/min   GFR calc Af Amer >90 >90 mL/min   Anion gap 12 5 - 15  CBC with Differential  Result Value Ref Range   WBC 10.5 4.0 - 10.5 K/uL   RBC 5.07 3.87 - 5.11 MIL/uL   Hemoglobin 14.4 12.0 - 15.0 g/dL   HCT 43.5 36.0 - 46.0 %   MCV 85.8 78.0 - 100.0 fL   MCH 28.4 26.0 - 34.0 pg   MCHC 33.1 30.0 - 36.0 g/dL   RDW 13.4 11.5 - 15.5 %   Platelets 244 150 - 400 K/uL   Neutrophils Relative % 69 43 - 77 %   Neutro Abs 7.3 1.7 - 7.7 K/uL   Lymphocytes Relative 23 12 - 46 %   Lymphs Abs 2.4 0.7 - 4.0 K/uL   Monocytes Relative 7 3 - 12 %   Monocytes Absolute 0.7 0.1 - 1.0 K/uL   Eosinophils Relative 1 0 - 5 %   Eosinophils Absolute 0.1 0.0 - 0.7 K/uL   Basophils Relative 0 0 - 1 %   Basophils Absolute 0.0 0.0 - 0.1 K/uL  Urinalysis, Routine w reflex microscopic  Result Value Ref Range   Color, Urine YELLOW YELLOW   APPearance CLOUDY (A) CLEAR   Specific Gravity, Urine 1.023 1.005 - 1.030   pH 7.0 5.0 - 8.0    Glucose, UA NEGATIVE NEGATIVE mg/dL   Hgb urine dipstick SMALL (A) NEGATIVE   Bilirubin Urine NEGATIVE NEGATIVE   Ketones, ur 40 (A) NEGATIVE mg/dL   Protein, ur NEGATIVE NEGATIVE mg/dL   Urobilinogen, UA 0.2 0.0 - 1.0 mg/dL   Nitrite NEGATIVE NEGATIVE   Leukocytes, UA NEGATIVE NEGATIVE  Urine microscopic-add on  Result Value Ref Range   Squamous Epithelial / LPF RARE RARE   WBC, UA 0-2 <3 WBC/hpf   RBC / HPF 3-6 <3 RBC/hpf   Bacteria, UA FEW (A) RARE  Pregnancy, urine  Result Value Ref Range   Preg Test, Ur NEGATIVE NEGATIVE   US Renal  09/23/2014   CLINICAL DATA:  Flank pain, back pain, urinary tract infection.  EXAM: RENAL/URINARY TRACT ULTRASOUND COMPLETE  COMPARISON:  CT 06/16/2014  FINDINGS: Right Kidney:  Length: 9.3 cm. Echogenicity within normal limits. No mass or hydronephrosis visualized. No intrarenal or perirenal fluid collection.  Left Kidney:  Length: 11.2 cm. Echogenicity within normal limits. No mass or hydronephrosis visualized. No intrarenal or perirenal fluid collection.  Bladder:  Nondistended and not well evaluated.  IMPRESSION: 1. Normal sonographic appearance of the kidneys. 2. Bladder nondistended and not evaluated.   Electronically Signed   By: Jeb Levering M.D.   On: 09/23/2014 18:46    MDM  She has a prescription for hydrocodone at home. I'll write prescription for Zofran. No evidence of ureteral stone. No evidence of UTI. She is to keep her scheduled with Dr.Lowne tomorrow 11:30 AM Final diagnoses:  None   Right flank pain     Orlie Dakin, MD 09/23/14 1942

## 2014-09-24 ENCOUNTER — Ambulatory Visit (INDEPENDENT_AMBULATORY_CARE_PROVIDER_SITE_OTHER): Payer: 59 | Admitting: Family Medicine

## 2014-09-24 ENCOUNTER — Encounter: Payer: Self-pay | Admitting: Family Medicine

## 2014-09-24 VITALS — BP 110/70 | HR 84 | Temp 98.3°F | Wt 150.2 lb

## 2014-09-24 DIAGNOSIS — F908 Attention-deficit hyperactivity disorder, other type: Secondary | ICD-10-CM

## 2014-09-24 DIAGNOSIS — N390 Urinary tract infection, site not specified: Secondary | ICD-10-CM | POA: Diagnosis not present

## 2014-09-24 LAB — POCT URINALYSIS DIPSTICK
Bilirubin, UA: NEGATIVE
Glucose, UA: NEGATIVE
Ketones, UA: NEGATIVE
LEUKOCYTES UA: NEGATIVE
NITRITE UA: NEGATIVE
PH UA: 6
Spec Grav, UA: >=1.03
Urobilinogen, UA: 2

## 2014-09-24 MED ORDER — FLUCONAZOLE 150 MG PO TABS: ORAL_TABLET | ORAL | Status: DC

## 2014-09-24 MED ORDER — AMPHETAMINE-DEXTROAMPHET ER 30 MG PO CP24: 30.0000 mg | ORAL_CAPSULE | ORAL | Status: DC

## 2014-09-24 MED ORDER — AMPHETAMINE-DEXTROAMPHET ER 30 MG PO CP24: 30.0000 mg | ORAL_CAPSULE | Freq: Every day | ORAL | Status: DC

## 2014-09-24 MED ORDER — AMPHETAMINE-DEXTROAMPHET ER 30 MG PO CP24
30.0000 mg | ORAL_CAPSULE | Freq: Every day | ORAL | Status: DC
Start: 2014-09-24 — End: 2014-10-02

## 2014-09-24 NOTE — Assessment & Plan Note (Signed)
cipro 500 mg bid x 5 days  Culture pending cipro secondary to length of symptoms

## 2014-09-24 NOTE — Addendum Note (Signed)
Addended by: [name] on: 09/24/2014 01:21 PM   Modules accepted: Orders

## 2014-09-24 NOTE — Progress Notes (Signed)
[id number] Lowne, DO Chief Complaint  Patient presents with  . Hospitalization Follow-up    UTI---- ED follow up    Current Issues:  Presents with 9 days of dysuria, urinary urgency and urinary frequency Associated symptoms include:  flank pain on the right and nausea  Pt also asking to change adderall to xr--- it is not lasting long enough.   There is a previous history of of similar symptoms. Sexually active:  Yes with female.   No concern for STI.  Prior to Admission medications   Medication Sig Start Date End Date Taking? Authorizing Provider  amphetamine-dextroamphetamine (ADDERALL) 30 MG tablet Take 1 tablet by mouth daily. 08/30/14  Yes Rosalita Chessman, DO  HYDROcodone-acetaminophen (NORCO) 10-325 MG per tablet Take 1 tablet by mouth every 6 (six) hours as needed for moderate pain or severe pain (pain).   Yes Historical Provider, MD  levonorgestrel-ethinyl estradiol (AVIANE,ALESSE,LESSINA) 0.1-20 MG-MCG tablet Take 1 tablet by mouth daily.   Yes Historical Provider, MD  ondansetron (ZOFRAN) 8 MG tablet Take 1 tablet (8 mg total) by mouth every 8 (eight) hours as needed for nausea or vomiting. 09/23/14  Yes Orlie Dakin, MD  pantoprazole (PROTONIX) 40 MG tablet Take 1 tablet (40 mg total) by mouth daily at 12 noon. 06/24/14  Yes Rosalita Chessman, DO    Review of Systems:see above  PE:  BP 110/70 mmHg  Pulse 84  Temp(Src) 98.3 F (36.8 C) (Oral)  Wt 150 lb 3.2 oz (68.13 kg)  SpO2 96%  LMP 08/25/2014 Constitutional--no fever, chills, + nausea, no vomiting  Heart-- S1S2  Lungs-- ctab/l  Back---no flank pain with palpation Abdomen-- R uq mild tenderness Pelvic-- no d/c  Results for orders placed or performed during the hospital encounter of 09/23/14  Comprehensive metabolic panel  Result Value Ref Range   Sodium 138 135 - 145 mmol/L   Potassium 3.7 3.5 - 5.1 mmol/L   Chloride 101 96 - 112 mmol/L   CO2 25 19 - 32 mmol/L   Glucose, Bld 100 (H) 70 - 99 mg/dL   BUN 8 6 - 23  mg/dL   Creatinine, Ser 0.61 0.50 - 1.10 mg/dL   Calcium 9.8 8.4 - 10.5 mg/dL   Total Protein 8.0 6.0 - 8.3 g/dL   Albumin 4.5 3.5 - 5.2 g/dL   AST 19 0 - 37 U/L   ALT 18 0 - 35 U/L   Alkaline Phosphatase 44 39 - 117 U/L   Total Bilirubin 0.6 0.3 - 1.2 mg/dL   GFR calc non Af Amer >90 >90 mL/min   GFR calc Af Amer >90 >90 mL/min   Anion gap 12 5 - 15  CBC with Differential  Result Value Ref Range   WBC 10.5 4.0 - 10.5 K/uL   RBC 5.07 3.87 - 5.11 MIL/uL   Hemoglobin 14.4 12.0 - 15.0 g/dL   HCT 43.5 36.0 - 46.0 %   MCV 85.8 78.0 - 100.0 fL   MCH 28.4 26.0 - 34.0 pg   MCHC 33.1 30.0 - 36.0 g/dL   RDW 13.4 11.5 - 15.5 %   Platelets 244 150 - 400 K/uL   Neutrophils Relative % 69 43 - 77 %   Neutro Abs 7.3 1.7 - 7.7 K/uL   Lymphocytes Relative 23 12 - 46 %   Lymphs Abs 2.4 0.7 - 4.0 K/uL   Monocytes Relative 7 3 - 12 %   Monocytes Absolute 0.7 0.1 - 1.0 K/uL   Eosinophils Relative 1 0 -  5 %   Eosinophils Absolute 0.1 0.0 - 0.7 K/uL   Basophils Relative 0 0 - 1 %   Basophils Absolute 0.0 0.0 - 0.1 K/uL  Urinalysis, Routine w reflex microscopic  Result Value Ref Range   Color, Urine YELLOW YELLOW   APPearance CLOUDY (A) CLEAR   Specific Gravity, Urine 1.023 1.005 - 1.030   pH 7.0 5.0 - 8.0   Glucose, UA NEGATIVE NEGATIVE mg/dL   Hgb urine dipstick SMALL (A) NEGATIVE   Bilirubin Urine NEGATIVE NEGATIVE   Ketones, ur 40 (A) NEGATIVE mg/dL   Protein, ur NEGATIVE NEGATIVE mg/dL   Urobilinogen, UA 0.2 0.0 - 1.0 mg/dL   Nitrite NEGATIVE NEGATIVE   Leukocytes, UA NEGATIVE NEGATIVE  Urine microscopic-add on  Result Value Ref Range   Squamous Epithelial / LPF RARE RARE   WBC, UA 0-2 <3 WBC/hpf   RBC / HPF 3-6 <3 RBC/hpf   Bacteria, UA FEW (A) RARE  Pregnancy, urine  Result Value Ref Range   Preg Test, Ur NEGATIVE NEGATIVE    Assessment and Plan:  1. UTI-- cipro and diflucan             Urine culture  1. Urinary tract infection without hematuria, site unspecified   -  Urine culture - fluconazole (DIFLUCAN) 150 MG tablet; 1 po qd x1, may repeat in 3 days prn  Dispense: 2 tablet; Refill: 3  2. Attention-deficit hyperactivity disorder, other type   - amphetamine-dextroamphetamine (ADDERALL XR) 30 MG 24 hr capsule; Take 1 capsule (30 mg total) by mouth daily.  Dispense: 30 capsule; Refill: 0 - amphetamine-dextroamphetamine (ADDERALL XR) 30 MG 24 hr capsule; Take 1 capsule (30 mg total) by mouth daily.  Dispense: 30 capsule; Refill: 0 - amphetamine-dextroamphetamine (ADDERALL XR) 30 MG 24 hr capsule; Take 1 capsule (30 mg total) by mouth every morning.  Dispense: 30 capsule; Refill: 0

## 2014-09-24 NOTE — Patient Instructions (Signed)

## 2014-09-24 NOTE — Progress Notes (Signed)
Pre visit review using our clinic review tool, if applicable. No additional management support is needed unless otherwise documented below in the visit note. 

## 2014-09-26 LAB — URINE CULTURE

## 2014-09-27 ENCOUNTER — Telehealth: Payer: Self-pay | Admitting: Family Medicine

## 2014-09-27 MED ORDER — CIPROFLOXACIN HCL 500 MG PO TABS: 500.0000 mg | ORAL_TABLET | Freq: Two times a day (BID) | ORAL | Status: DC

## 2014-09-27 NOTE — Telephone Encounter (Signed)
UTI (urinary tract infection) - Rosalita Chessman, DO at 09/24/2014 11:55 AM     Status: Written Related Problem: UTI (urinary tract infection)   Expand All Collapse All   cipro 500 mg bid x 5 days  Culture pending cipro secondary to length of symptoms       Per Elyn Aquas, PA OK to refill as previous note states.  However, due to urine culture results:  Colony Count 20,OOO COLONIES/ML   Organism ID, Bacteria Multiple bacterial morphotypes present, none   Organism ID, Bacteria predominant. Suggest appropriate recollection if    Organism ID, Bacteria clinically indicated.        If patient is not feeling better she should come for repeat culture.    Notified patient.

## 2014-09-27 NOTE — Telephone Encounter (Signed)
Caller name: Iva Relation to pt: self Call back number: [phone] Pharmacy: [facility]  Reason for call:   Patient states that she was prescribed Cipro by Dr. Etter Sjogren earlier this week. She states that the pharmacy did not receive this refill

## 2014-09-30 ENCOUNTER — Telehealth: Payer: Self-pay | Admitting: *Deleted

## 2014-09-30 NOTE — Telephone Encounter (Signed)
Prior authorization for amphetamine-dextroamphetamine ER initiated. Awaiting determination. JG//CMA

## 2014-10-02 ENCOUNTER — Ambulatory Visit (INDEPENDENT_AMBULATORY_CARE_PROVIDER_SITE_OTHER): Payer: 59 | Admitting: Medical

## 2014-10-02 ENCOUNTER — Encounter: Payer: Self-pay | Admitting: Medical

## 2014-10-02 VITALS — BP 121/80 | HR 63 | Temp 98.4°F | Ht 66.0 in | Wt 150.4 lb

## 2014-10-02 DIAGNOSIS — L0390 Cellulitis, unspecified: Secondary | ICD-10-CM | POA: Insufficient documentation

## 2014-10-02 DIAGNOSIS — L03113 Cellulitis of right upper limb: Secondary | ICD-10-CM

## 2014-10-02 MED ORDER — CEFTRIAXONE SODIUM 1 G IJ SOLR
1.0000 g | Freq: Once | INTRAMUSCULAR | Status: AC
  Administered 2014-10-02: 1 g via INTRAMUSCULAR

## 2014-10-02 MED ORDER — SULFAMETHOXAZOLE-TRIMETHOPRIM 800-160 MG PO TABS: 1.0000 | ORAL_TABLET | Freq: Two times a day (BID) | ORAL | Status: DC

## 2014-10-02 NOTE — Progress Notes (Deleted)
before

## 2014-10-02 NOTE — Patient Instructions (Addendum)
Cellulitis Rocephin IM and bactrim DS. Warm salt water soaks to area bid.  Area may worsen. If area spread toward axillary area and over weekend then ED evaluation.   If area directly over swollen area becomes larger more pain and soft in center may need I and D. Your expressed no desire for that today and I+D not indicated today but this may need to be done in near future.   Follow up 5 days or as needed.  Note over weekend UC is available as well as ED.

## 2014-10-02 NOTE — Progress Notes (Signed)
Subjective:    Patient ID: [name], female    DOB: 1986-04-15, 29 y.o.   MRN: [id number]  HPI  Pt in with rt upper arm area that is tender to touch. No fever, no chills or sweats. Mild nausea today. Tender swollen area came up on Monday. Area has increased in size. Pt had staph infection before. This was 2 years. This occur on left groin area. That area did drain.  Pt expresses that she does not want procedure done/I +D.  LMP- 2 wks ago. Normal cycle.    Review of Systems  Constitutional: Negative for fever, chills and fatigue.  Cardiovascular: Negative for chest pain and palpitations.  Gastrointestinal: Positive for nausea.       Seems to occur with pain from arm.  Musculoskeletal:       Rt medial arm pain.  Skin:       Rt upper medial arm red, warm, swollen and tender.  Hematological: Negative for adenopathy. Does not bruise/bleed easily.  Psychiatric/Behavioral: Negative for behavioral problems.    Past Medical History  Diagnosis Date  . Gastric reflux   . Anxiety   . Basedow's disease   . Pyelonephritis   . History of nephrolithiasis   . Anxiety disorder   . Bipolar disorder   . Interstitial cystitis   . Chronic back pain   . Fibroid   . Barrett esophagus   . ADD (attention deficit disorder)   . Back pain     History   Social History  . Marital Status: Single    Spouse Name: N/A  . Number of Children: N/A  . Years of Education: N/A   Occupational History  . driver     [employer]   Social History Main Topics  . Smoking status: Current Some Day Smoker -- 0.50 packs/day for 8 years    Types: Cigarettes  . Smokeless tobacco: Never Used  . Alcohol Use: No  . Drug Use: No  . Sexual Activity: Not on file   Other Topics Concern  . Not on file   Social History Narrative    Past Surgical History  Procedure Laterality Date  . Tonsillectomy    . Adenoidectomy    . Tear duct probing    . Cholecystectomy N/A 01/05/2014    Procedure:  LAPAROSCOPIC CHOLECYSTECTOMY WITH INTRAOPERATIVE CHOLANGIOGRAM;  Surgeon:  Jolly, MD;  Location: [facility];  Service: General;  Laterality: N/A;  . Esophagogastroduodenoscopy (egd) with propofol N/A 06/18/2014    Procedure: ESOPHAGOGASTRODUODENOSCOPY (EGD) WITH PROPOFOL;  Surgeon: Wonda Horner, MD;  Location: [facility] ENDOSCOPY;  Service: Endoscopy;  Laterality: N/A;    Family History  Problem Relation Age of Onset  . Diabetes    . Hypertension    . Lung cancer    . Bipolar disorder Sister     No Known Allergies  Current Outpatient Prescriptions on File Prior to Visit  Medication Sig Dispense Refill  . amphetamine-dextroamphetamine (ADDERALL XR) 30 MG 24 hr capsule Take 1 capsule (30 mg total) by mouth daily. 30 capsule 0  . ciprofloxacin (CIPRO) 500 MG tablet Take 1 tablet (500 mg total) by mouth 2 (two) times daily. 10 tablet 0  . HYDROcodone-acetaminophen (NORCO) 10-325 MG per tablet Take 1 tablet by mouth every 6 (six) hours as needed for moderate pain or severe pain (pain).    . ondansetron (ZOFRAN) 8 MG tablet Take 1 tablet (8 mg total) by mouth every 8 (eight) hours as needed for nausea or  vomiting. 4 tablet 0  . pantoprazole (PROTONIX) 40 MG tablet Take 1 tablet (40 mg total) by mouth daily at 12 noon. 30 tablet 5  . fluconazole (DIFLUCAN) 150 MG tablet 1 po qd x1, may repeat in 3 days prn 2 tablet 3  . levonorgestrel-ethinyl estradiol (AVIANE,ALESSE,LESSINA) 0.1-20 MG-MCG tablet Take 1 tablet by mouth daily.     No current facility-administered medications on file prior to visit.    BP 121/80 mmHg  Pulse 63  Temp(Src) 98.4 F (36.9 C) (Oral)  Ht 5\' 6"  (1.676 m)  Wt 150 lb 6.4 oz (68.221 kg)  BMI 24.29 kg/m2  SpO2 100%  LMP 09/21/2014  Breastfeeding? No      Objective:   Physical Exam  General- No acute distress. Pleasant patient. Neck- Full range of motion, no jvd Lungs- Clear, even and unlabored. Heart- regular rate and rhythm. Neurologic- CNII- XII  grossly intact. Rt upper arm- medial bicep area 2.5 cm swollen area, very tender, indurated, no fluctuant tender area presently. No axillary lymph nodes felt.      Assessment & Plan:

## 2014-10-02 NOTE — Assessment & Plan Note (Addendum)
Rocephin IM and bactrim DS. Warm salt water soaks to area bid.  Area may worsen. If area spread toward axillary area and over weekend then ED evaluation.   If area directly over swollen area becomes larger more pain and soft in center may need I and D. Your expressed no desire for that today and I+D not indicated today but this may need to be done in near future.   Follow up 5 days or as needed.  Note over weekend UC is available as well as ED.

## 2014-10-02 NOTE — Progress Notes (Signed)
Pre visit review using our clinic review tool, if applicable. No additional management support is needed unless otherwise documented below in the visit note. 

## 2014-10-03 ENCOUNTER — Encounter (HOSPITAL_BASED_OUTPATIENT_CLINIC_OR_DEPARTMENT_OTHER): Payer: Self-pay | Admitting: Emergency Medicine

## 2014-10-03 ENCOUNTER — Emergency Department (HOSPITAL_BASED_OUTPATIENT_CLINIC_OR_DEPARTMENT_OTHER)
Admission: EM | Admit: 2014-10-03 | Discharge: 2014-10-04 | Disposition: A | Discharge status: Home / Self Care | Payer: 59 | Attending: Emergency Medicine | Admitting: Emergency Medicine

## 2014-10-03 DIAGNOSIS — F909 Attention-deficit hyperactivity disorder, unspecified type: Secondary | ICD-10-CM | POA: Insufficient documentation

## 2014-10-03 DIAGNOSIS — Z792 Long term (current) use of antibiotics: Secondary | ICD-10-CM | POA: Diagnosis not present

## 2014-10-03 DIAGNOSIS — Z8742 Personal history of other diseases of the female genital tract: Secondary | ICD-10-CM | POA: Diagnosis not present

## 2014-10-03 DIAGNOSIS — Z793 Long term (current) use of hormonal contraceptives: Secondary | ICD-10-CM | POA: Insufficient documentation

## 2014-10-03 DIAGNOSIS — Z87442 Personal history of urinary calculi: Secondary | ICD-10-CM | POA: Insufficient documentation

## 2014-10-03 DIAGNOSIS — M7989 Other specified soft tissue disorders: Secondary | ICD-10-CM | POA: Diagnosis present

## 2014-10-03 DIAGNOSIS — L02411 Cutaneous abscess of right axilla: Secondary | ICD-10-CM | POA: Diagnosis not present

## 2014-10-03 DIAGNOSIS — K219 Gastro-esophageal reflux disease without esophagitis: Secondary | ICD-10-CM | POA: Insufficient documentation

## 2014-10-03 DIAGNOSIS — Z720 Tobacco use: Secondary | ICD-10-CM | POA: Insufficient documentation

## 2014-10-03 DIAGNOSIS — Z79899 Other long term (current) drug therapy: Secondary | ICD-10-CM | POA: Insufficient documentation

## 2014-10-03 DIAGNOSIS — G8929 Other chronic pain: Secondary | ICD-10-CM | POA: Diagnosis not present

## 2014-10-03 DIAGNOSIS — Z8639 Personal history of other endocrine, nutritional and metabolic disease: Secondary | ICD-10-CM | POA: Insufficient documentation

## 2014-10-03 NOTE — ED Notes (Signed)
Pt reports infection to right medial arm earlier in week , refused to let them lance abscess, pt called pcp tonight and told her to come her

## 2014-10-04 MED ORDER — LIDOCAINE-EPINEPHRINE 2 %-1:100000 IJ SOLN
INTRAMUSCULAR | Status: AC
Start: 2014-10-04 — End: 2014-10-04
  Administered 2014-10-04: 1 mL
  Filled 2014-10-04: qty 1

## 2014-10-04 MED ORDER — HYDROMORPHONE HCL 1 MG/ML IJ SOLN
2.0000 mg | Freq: Once | INTRAMUSCULAR | Status: AC
  Administered 2014-10-04: 2 mg via INTRAMUSCULAR
  Filled 2014-10-04: qty 2

## 2014-10-04 NOTE — ED Provider Notes (Signed)
CSN: [id number]     Arrival date & time 10/03/14  2332 History   First MD Initiated Contact with Patient 10/04/14 [phone]     Chief Complaint  Patient presents with  . Arm Problem     (Consider location/radiation/quality/duration/timing/severity/associated sxs/prior Treatment) HPI  This is a 29 year old female with a history of chronic pain on hydrocodone/APAP 10mg /325mg  3 times daily. She is here with an abscess just distal to her right axilla on the medial aspect of the right upper arm. It began 4 days ago. She was seen by a physician 2 days ago who recommended incision and drainage but she refused at that time. She was placed on Bactrim but the abscess has worsened since. She now complains of severe pain at the site not relieved by her home hydrocodone. Pain is worse with movement or palpation. She denies fever or chills. She contacted her PCP earlier and was advised to come to the ED for definitive treatment.  Past Medical History  Diagnosis Date  . Gastric reflux   . Anxiety   . Basedow's disease   . Pyelonephritis   . History of nephrolithiasis   . Anxiety disorder   . Bipolar disorder   . Interstitial cystitis   . Chronic back pain   . Fibroid   . Barrett esophagus   . ADD (attention deficit disorder)   . Back pain    Past Surgical History  Procedure Laterality Date  . Tonsillectomy    . Adenoidectomy    . Tear duct probing    . Cholecystectomy N/A 01/05/2014    Procedure: LAPAROSCOPIC CHOLECYSTECTOMY WITH INTRAOPERATIVE CHOLANGIOGRAM;  Surgeon: Edward Jolly, MD;  Location: [facility];  Service: General;  Laterality: N/A;  . Esophagogastroduodenoscopy (egd) with propofol N/A 06/18/2014    Procedure: ESOPHAGOGASTRODUODENOSCOPY (EGD) WITH PROPOFOL;  Surgeon: Wonda Horner, MD;  Location: [facility] ENDOSCOPY;  Service: Endoscopy;  Laterality: N/A;   Family History  Problem Relation Age of Onset  . Diabetes    . Hypertension    . Lung cancer    . Bipolar disorder Sister     History  Substance Use Topics  . Smoking status: Current Some Day Smoker -- 0.50 packs/day for 8 years    Types: Cigarettes  . Smokeless tobacco: Never Used  . Alcohol Use: No   OB History    Gravida Para Term Preterm AB TAB SAB Ectopic Multiple Living   1 1 1       1      Review of Systems  All other systems reviewed and are negative.   Allergies  Review of patient's allergies indicates no known allergies.  Home Medications   Prior to Admission medications   Medication Sig Start Date End Date Taking? Authorizing Provider  amphetamine-dextroamphetamine (ADDERALL XR) 30 MG 24 hr capsule Take 1 capsule (30 mg total) by mouth daily. 09/24/14  Yes Yvonne R Lowne, DO  ciprofloxacin (CIPRO) 500 MG tablet Take 1 tablet (500 mg total) by mouth 2 (two) times daily. 09/27/14  Yes Brunetta Jeans, PA-C  HYDROcodone-acetaminophen (NORCO) 10-325 MG per tablet Take 1 tablet by mouth every 6 (six) hours as needed for moderate pain or severe pain (pain).   Yes Historical Provider, MD  levonorgestrel-ethinyl estradiol (AVIANE,ALESSE,LESSINA) 0.1-20 MG-MCG tablet Take 1 tablet by mouth daily.   Yes Historical Provider, MD  pantoprazole (PROTONIX) 40 MG tablet Take 1 tablet (40 mg total) by mouth daily at 12 noon. 06/24/14  Yes Rosalita Chessman, DO  sulfamethoxazole-trimethoprim (BACTRIM  DS,SEPTRA DS) 800-160 MG per tablet Take 1 tablet by mouth 2 (two) times daily. 10/02/14  Yes Meriam Sprague Saguier, PA-C  fluconazole (DIFLUCAN) 150 MG tablet 1 po qd x1, may repeat in 3 days prn 09/24/14   Alferd Apa Lowne, DO   BP 92/57 mmHg  Pulse 78  Temp(Src) 98.4 F (36.9 C) (Oral)  Resp 16  Ht 5\' 6"  (1.676 m)  Wt 149 lb (67.586 kg)  BMI 24.06 kg/m2  SpO2 99%  LMP 09/21/2014   Physical Exam  General: Well-developed, well-nourished female in no acute distress; appearance consistent with age of record HENT: normocephalic; atraumatic Eyes: Normal appearance Neck: supple Heart: regular rate and rhythm Lungs:  Normal respiratory effort and excursion Abdomen: soft; nondistended Extremities: No deformity; full range of motion Neurologic: Awake, alert and oriented; motor function intact in all extremities and symmetric; no facial droop Skin: Warm and dry; fluctuant abscess just distal to right axilla with surrounding erythema, no axillary lymphadenopathy palpated Psychiatric: Anxious; tearful    ED Course  Procedures (including critical care time)  INCISION AND DRAINAGE Performed by: Wynetta Fines Consent: Verbal consent obtained. Risks and benefits: risks, benefits and alternatives were discussed Type: abscess  Body area: Just distal to right axilla  Anesthesia: local infiltration  Incision was made with a scalpel.  Local anesthetic: lidocaine 2 % with epinephrine  Anesthetic total: 3 ml  Complexity: complex Blunt dissection to break up loculations  Drainage: purulent  Drainage amount: Copious   Packing material: 1/4 in iodoform gauze  Patient tolerance: Patient tolerated the procedure well with no immediate complications.    MDM     Shanon Rosser, MD 10/04/14 [phone]

## 2014-10-04 NOTE — Discharge Instructions (Signed)

## 2014-10-07 ENCOUNTER — Encounter: Payer: Self-pay | Admitting: Family Medicine

## 2014-10-07 ENCOUNTER — Ambulatory Visit (INDEPENDENT_AMBULATORY_CARE_PROVIDER_SITE_OTHER): Payer: 59 | Admitting: Family Medicine

## 2014-10-07 VITALS — BP 102/66 | HR 80 | Temp 98.4°F | Resp 18 | Ht 66.0 in | Wt 148.0 lb

## 2014-10-07 DIAGNOSIS — L02413 Cutaneous abscess of right upper limb: Secondary | ICD-10-CM

## 2014-10-07 MED ORDER — CEFTRIAXONE SODIUM 1 G IJ SOLR
1.0000 g | Freq: Once | INTRAMUSCULAR | Status: AC
  Administered 2014-10-07: 1 g via INTRAMUSCULAR

## 2014-10-07 MED ORDER — HYDROCODONE-ACETAMINOPHEN 10-325 MG PO TABS: 1.0000 | ORAL_TABLET | Freq: Four times a day (QID) | ORAL | Status: DC | PRN

## 2014-10-07 NOTE — Progress Notes (Signed)
Pre visit review using our clinic review tool, if applicable. No additional management support is needed unless otherwise documented below in the visit note. 

## 2014-10-07 NOTE — Progress Notes (Signed)
Patient ID: [name], female    DOB: [birth year]  Age: 29 y.o. MRN: [id number]    Subjective:  Subjective HPI [name] presents for f/u abscess / cellulitis R axilla.  Pt has been to office and was seen in ER where I and D was done.  Pt mother is also present and states it is much better than when she was in the Er but it is still very painful.    Review of Systems  Constitutional: Negative for activity change, appetite change, fatigue and unexpected weight change.  Respiratory: Negative for cough and shortness of breath.   Cardiovascular: Negative for chest pain and palpitations.  Skin: Positive for wound. Negative for rash.  Psychiatric/Behavioral: Negative for behavioral problems and dysphoric mood. The patient is not nervous/anxious.     History Past Medical History  Diagnosis Date  . Gastric reflux   . Anxiety   . Basedow's disease   . Pyelonephritis   . History of nephrolithiasis   . Anxiety disorder   . Bipolar disorder   . Interstitial cystitis   . Chronic back pain   . Fibroid   . Barrett esophagus   . ADD (attention deficit disorder)   . Back pain     She has past surgical history that includes Tonsillectomy; Adenoidectomy; Tear duct probing; Cholecystectomy (N/A, 01/05/2014); and Esophagogastroduodenoscopy (egd) with propofol (N/A, 06/18/2014).   Her family history includes Bipolar disorder in her sister; Diabetes in an other family member; Hypertension in an other family member; Lung cancer in an other family member.She reports that she has been smoking Cigarettes.  She has a 4 pack-year smoking history. She has never used smokeless tobacco. She reports that she does not drink alcohol or use illicit drugs.  Current Outpatient Prescriptions on File Prior to Visit  Medication Sig Dispense Refill  . amphetamine-dextroamphetamine (ADDERALL XR) 30 MG 24 hr capsule Take 1 capsule (30 mg total) by mouth daily. 30 capsule 0  . ciprofloxacin (CIPRO) 500 MG  tablet Take 1 tablet (500 mg total) by mouth 2 (two) times daily. 10 tablet 0  . fluconazole (DIFLUCAN) 150 MG tablet 1 po qd x1, may repeat in 3 days prn 2 tablet 3  . levonorgestrel-ethinyl estradiol (AVIANE,ALESSE,LESSINA) 0.1-20 MG-MCG tablet Take 1 tablet by mouth daily.    . pantoprazole (PROTONIX) 40 MG tablet Take 1 tablet (40 mg total) by mouth daily at 12 noon. 30 tablet 5  . sulfamethoxazole-trimethoprim (BACTRIM DS,SEPTRA DS) 800-160 MG per tablet Take 1 tablet by mouth 2 (two) times daily. 20 tablet 0   No current facility-administered medications on file prior to visit.     Objective:  Objective Physical Exam  Constitutional: She is oriented to person, place, and time. She appears well-developed and well-nourished. No distress.  HENT:  Right Ear: External ear normal.  Left Ear: External ear normal.  Nose: Nose normal.  Mouth/Throat: Oropharynx is clear and moist.  Eyes: EOM are normal. Pupils are equal, round, and reactive to light.  Neck: Normal range of motion. Neck supple.  Cardiovascular: Normal rate, regular rhythm and normal heart sounds.   No murmur heard. Pulmonary/Chest: Effort normal and breath sounds normal. No respiratory distress. She has no wheezes. She has no rales. She exhibits no tenderness.  Neurological: She is alert and oriented to person, place, and time.  Skin:     Psychiatric: She has a normal mood and affect. Her behavior is normal. Judgment and thought content normal.   BP 102/66 mmHg  Pulse 80  Temp(Src) 98.4 F (36.9 C) (Oral)  Resp 18  Ht 5\' 6"  (1.676 m)  Wt 148 lb (67.132 kg)  BMI 23.90 kg/m2  SpO2 99%  LMP 09/21/2014 Wt Readings from Last 3 Encounters:  10/07/14 148 lb (67.132 kg)  10/03/14 149 lb (67.586 kg)  10/02/14 150 lb 6.4 oz (68.221 kg)     Lab Results  Component Value Date   WBC 10.5 09/23/2014   HGB 14.4 09/23/2014   HCT 43.5 09/23/2014   PLT 244 09/23/2014   GLUCOSE 100* 09/23/2014   ALT 18 09/23/2014   AST  19 09/23/2014   NA 138 09/23/2014   K 3.7 09/23/2014   CL 101 09/23/2014   CREATININE 0.61 09/23/2014   BUN 8 09/23/2014   CO2 25 09/23/2014   TSH 0.20* 12/04/2009   INR 1.11 06/17/2014    No results found.   Assessment & Plan:  Plan I am having [name] maintain her levonorgestrel-ethinyl estradiol, pantoprazole, fluconazole, amphetamine-dextroamphetamine, ciprofloxacin, sulfamethoxazole-trimethoprim, and HYDROcodone-acetaminophen. We administered cefTRIAXone.  Meds ordered this encounter  Medications  . DISCONTD: sulfamethoxazole-trimethoprim (BACTRIM DS,SEPTRA DS) 800-160 MG per tablet    Sig:   . DISCONTD: HYDROcodone-acetaminophen (NORCO) 10-325 MG per tablet    Sig:   . DISCONTD: ciprofloxacin (CIPRO) 500 MG tablet    Sig:   . DISCONTD: fluconazole (DIFLUCAN) 150 MG tablet    Sig:   . HYDROcodone-acetaminophen (NORCO) 10-325 MG per tablet    Sig: Take 1 tablet by mouth every 6 (six) hours as needed for moderate pain or severe pain (pain).    Dispense:  30 tablet    Refill:  0  . cefTRIAXone (ROCEPHIN) injection 1 g    Sig:     Problem List Items Addressed This Visit    None    Visit Diagnoses    Abscess of arm, right    -  Primary    Relevant Medications    HYDROcodone-acetaminophen (NORCO) 10-325 MG per tablet    cefTRIAXone (ROCEPHIN) injection 1 g (Completed)    Other Relevant Orders    Ambulatory referral to General Surgery       Follow-up: Return if symptoms worsen or fail to improve.  Garnet Koyanagi, DO

## 2014-10-08 NOTE — Addendum Note (Signed)
Addended by: Rosalita Chessman on: 10/08/2014 03:26 PM   Modules accepted: Orders

## 2014-10-09 ENCOUNTER — Telehealth: Payer: Self-pay | Admitting: Family Medicine

## 2014-10-09 NOTE — Telephone Encounter (Signed)
error:[id number] ° °

## 2014-10-11 LAB — WOUND CULTURE
GRAM STAIN: NONE SEEN
GRAM STAIN: NONE SEEN
Gram Stain: NONE SEEN
Organism ID, Bacteria: NO GROWTH

## 2014-11-25 ENCOUNTER — Emergency Department (HOSPITAL_BASED_OUTPATIENT_CLINIC_OR_DEPARTMENT_OTHER): Payer: 59

## 2014-11-25 ENCOUNTER — Emergency Department (HOSPITAL_BASED_OUTPATIENT_CLINIC_OR_DEPARTMENT_OTHER)
Admission: EM | Admit: 2014-11-25 | Discharge: 2014-11-25 | Disposition: A | Discharge status: Home / Self Care | Payer: 59 | Attending: Emergency Medicine | Admitting: Emergency Medicine

## 2014-11-25 ENCOUNTER — Encounter (HOSPITAL_BASED_OUTPATIENT_CLINIC_OR_DEPARTMENT_OTHER): Payer: Self-pay | Admitting: Emergency Medicine

## 2014-11-25 DIAGNOSIS — F909 Attention-deficit hyperactivity disorder, unspecified type: Secondary | ICD-10-CM | POA: Diagnosis not present

## 2014-11-25 DIAGNOSIS — Z792 Long term (current) use of antibiotics: Secondary | ICD-10-CM | POA: Diagnosis not present

## 2014-11-25 DIAGNOSIS — Z86018 Personal history of other benign neoplasm: Secondary | ICD-10-CM | POA: Diagnosis not present

## 2014-11-25 DIAGNOSIS — R1031 Right lower quadrant pain: Secondary | ICD-10-CM | POA: Diagnosis not present

## 2014-11-25 DIAGNOSIS — G8929 Other chronic pain: Secondary | ICD-10-CM | POA: Diagnosis not present

## 2014-11-25 DIAGNOSIS — Z87448 Personal history of other diseases of urinary system: Secondary | ICD-10-CM | POA: Diagnosis not present

## 2014-11-25 DIAGNOSIS — Z79899 Other long term (current) drug therapy: Secondary | ICD-10-CM | POA: Insufficient documentation

## 2014-11-25 DIAGNOSIS — Z87442 Personal history of urinary calculi: Secondary | ICD-10-CM | POA: Diagnosis not present

## 2014-11-25 DIAGNOSIS — R112 Nausea with vomiting, unspecified: Secondary | ICD-10-CM | POA: Diagnosis not present

## 2014-11-25 DIAGNOSIS — Z9049 Acquired absence of other specified parts of digestive tract: Secondary | ICD-10-CM | POA: Insufficient documentation

## 2014-11-25 DIAGNOSIS — K219 Gastro-esophageal reflux disease without esophagitis: Secondary | ICD-10-CM | POA: Insufficient documentation

## 2014-11-25 DIAGNOSIS — Z3202 Encounter for pregnancy test, result negative: Secondary | ICD-10-CM | POA: Diagnosis not present

## 2014-11-25 LAB — CBC WITH DIFFERENTIAL/PLATELET
BASOS ABS: 0.1 10*3/uL (ref 0.0–0.1)
Basophils Relative: 0 % (ref 0–1)
EOS ABS: 0.7 10*3/uL (ref 0.0–0.7)
EOS PCT: 6 % — AB (ref 0–5)
HCT: 41.2 % (ref 36.0–46.0)
Hemoglobin: 13.5 g/dL (ref 12.0–15.0)
Lymphocytes Relative: 28 % (ref 12–46)
Lymphs Abs: 3.1 10*3/uL (ref 0.7–4.0)
MCH: 27.5 pg (ref 26.0–34.0)
MCHC: 32.8 g/dL (ref 30.0–36.0)
MCV: 83.9 fL (ref 78.0–100.0)
Monocytes Absolute: 0.9 10*3/uL (ref 0.1–1.0)
Monocytes Relative: 8 % (ref 3–12)
Neutro Abs: 6.4 10*3/uL (ref 1.7–7.7)
Neutrophils Relative %: 58 % (ref 43–77)
PLATELETS: 248 10*3/uL (ref 150–400)
RBC: 4.91 MIL/uL (ref 3.87–5.11)
RDW: 14.2 % (ref 11.5–15.5)
WBC: 11.1 10*3/uL — AB (ref 4.0–10.5)

## 2014-11-25 LAB — URINALYSIS, ROUTINE W REFLEX MICROSCOPIC
BILIRUBIN URINE: NEGATIVE
Glucose, UA: NEGATIVE mg/dL
HGB URINE DIPSTICK: NEGATIVE
KETONES UR: NEGATIVE mg/dL
NITRITE: NEGATIVE
Protein, ur: NEGATIVE mg/dL
Specific Gravity, Urine: 1.018 (ref 1.005–1.030)
Urobilinogen, UA: 0.2 mg/dL (ref 0.0–1.0)
pH: 8.5 — ABNORMAL HIGH (ref 5.0–8.0)

## 2014-11-25 LAB — COMPREHENSIVE METABOLIC PANEL
ALT: 16 U/L (ref 14–54)
ANION GAP: 6 (ref 5–15)
AST: 17 U/L (ref 15–41)
Albumin: 4.3 g/dL (ref 3.5–5.0)
Alkaline Phosphatase: 45 U/L (ref 38–126)
BILIRUBIN TOTAL: 0.6 mg/dL (ref 0.3–1.2)
BUN: 9 mg/dL (ref 6–20)
CALCIUM: 9.2 mg/dL (ref 8.9–10.3)
CHLORIDE: 103 mmol/L (ref 101–111)
CO2: 27 mmol/L (ref 22–32)
CREATININE: 0.64 mg/dL (ref 0.44–1.00)
GFR calc non Af Amer: >60 mL/min (ref >60)
Glucose, Bld: 103 mg/dL — ABNORMAL HIGH (ref 65–99)
POTASSIUM: 4 mmol/L (ref 3.5–5.1)
Sodium: 136 mmol/L (ref 135–145)
Total Protein: 7.3 g/dL (ref 6.5–8.1)

## 2014-11-25 LAB — URINE MICROSCOPIC-ADD ON

## 2014-11-25 LAB — PREGNANCY, URINE: Preg Test, Ur: NEGATIVE

## 2014-11-25 MED ORDER — MORPHINE SULFATE 4 MG/ML IJ SOLN
4.0000 mg | Freq: Once | INTRAMUSCULAR | Status: AC
  Administered 2014-11-25: 4 mg via INTRAVENOUS
  Filled 2014-11-25: qty 1

## 2014-11-25 MED ORDER — IOHEXOL 300 MG/ML  SOLN
50.0000 mL | Freq: Once | INTRAMUSCULAR | Status: AC | PRN
Start: 2014-11-25 — End: 2014-11-25
  Administered 2014-11-25: 50 mL via ORAL

## 2014-11-25 MED ORDER — SODIUM CHLORIDE 0.9 % IV BOLUS (SEPSIS)
500.0000 mL | Freq: Once | INTRAVENOUS | Status: AC
  Administered 2014-11-25: 500 mL via INTRAVENOUS

## 2014-11-25 MED ORDER — ONDANSETRON HCL 4 MG/2ML IJ SOLN
4.0000 mg | Freq: Once | INTRAMUSCULAR | Status: AC
  Administered 2014-11-25: 4 mg via INTRAVENOUS
  Filled 2014-11-25: qty 2

## 2014-11-25 MED ORDER — IOHEXOL 300 MG/ML  SOLN
100.0000 mL | Freq: Once | INTRAMUSCULAR | Status: AC | PRN
  Administered 2014-11-25: 100 mL via INTRAVENOUS

## 2014-11-25 MED ORDER — ONDANSETRON HCL 4 MG/2ML IJ SOLN
4.0000 mg | Freq: Once | INTRAMUSCULAR | Status: AC
Start: 2014-11-25 — End: 2014-11-25
  Administered 2014-11-25: 4 mg via INTRAVENOUS
  Filled 2014-11-25: qty 2

## 2014-11-25 NOTE — ED Notes (Signed)
Patient states that she is havin gpain to her right lower quad starting yesterday. The patient also reports that she has N/V

## 2014-11-25 NOTE — ED Notes (Signed)
MD at bedside. 

## 2014-11-25 NOTE — Discharge Instructions (Signed)
Follow-up with your primary Dr. if not improving in the next 2-3 days.   Abdominal Pain Many things can cause abdominal pain. Usually, abdominal pain is not caused by a disease and will improve without treatment. It can often be observed and treated at home. Your health care provider will do a physical exam and possibly order blood tests and X-rays to help determine the seriousness of your pain. However, in many cases, more time must pass before a clear cause of the pain can be found. Before that point, your health care provider may not know if you need more testing or further treatment. HOME CARE INSTRUCTIONS  Monitor your abdominal pain for any changes. The following actions may help to alleviate any discomfort you are experiencing:  Only take over-the-counter or prescription medicines as directed by your health care provider.  Do not take laxatives unless directed to do so by your health care provider.  Try a clear liquid diet (broth, tea, or water) as directed by your health care provider. Slowly move to a bland diet as tolerated. SEEK MEDICAL CARE IF:  You have unexplained abdominal pain.  You have abdominal pain associated with nausea or diarrhea.  You have pain when you urinate or have a bowel movement.  You experience abdominal pain that wakes you in the night.  You have abdominal pain that is worsened or improved by eating food.  You have abdominal pain that is worsened with eating fatty foods.  You have a fever. SEEK IMMEDIATE MEDICAL CARE IF:   Your pain does not go away within 2 hours.  You keep throwing up (vomiting).  Your pain is felt only in portions of the abdomen, such as the right side or the left lower portion of the abdomen.  You pass bloody or black tarry stools. MAKE SURE YOU:  Understand these instructions.   Will watch your condition.   Will get help right away if you are not doing well or get worse.  Document Released: 03/10/2005 Document  Revised: 06/05/2013 Document Reviewed: 02/07/2013 [facility] Patient Information 2015 [place], [place]. This information is not intended to replace advice given to you by your health care provider. Make sure you discuss any questions you have with your health care provider.
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
## 2014-11-25 NOTE — ED Notes (Signed)
Pt drinking ct contrast

## 2014-12-10 ENCOUNTER — Ambulatory Visit: Payer: 59 | Admitting: Family Medicine

## 2014-12-10 ENCOUNTER — Ambulatory Visit (INDEPENDENT_AMBULATORY_CARE_PROVIDER_SITE_OTHER): Payer: 59 | Admitting: Family Medicine

## 2014-12-10 ENCOUNTER — Encounter: Payer: Self-pay | Admitting: Family Medicine

## 2014-12-10 VITALS — BP 118/79 | HR 80 | Temp 98.0°F | Ht 66.0 in | Wt 154.6 lb

## 2014-12-10 DIAGNOSIS — N390 Urinary tract infection, site not specified: Secondary | ICD-10-CM

## 2014-12-10 DIAGNOSIS — N23 Unspecified renal colic: Secondary | ICD-10-CM | POA: Diagnosis not present

## 2014-12-10 DIAGNOSIS — F909 Attention-deficit hyperactivity disorder, unspecified type: Secondary | ICD-10-CM | POA: Diagnosis not present

## 2014-12-10 DIAGNOSIS — R300 Dysuria: Secondary | ICD-10-CM

## 2014-12-10 DIAGNOSIS — R82998 Other abnormal findings in urine: Secondary | ICD-10-CM

## 2014-12-10 LAB — POCT URINALYSIS DIPSTICK
Bilirubin, UA: NEGATIVE
GLUCOSE UA: NEGATIVE
Ketones, UA: NEGATIVE
NITRITE UA: NEGATIVE
PROTEIN UA: NEGATIVE
Spec Grav, UA: >=1.03
UROBILINOGEN UA: 2
pH, UA: 5.5

## 2014-12-10 MED ORDER — AMPHETAMINE-DEXTROAMPHETAMINE 20 MG PO TABS: 20.0000 mg | ORAL_TABLET | Freq: Two times a day (BID) | ORAL | Status: DC

## 2014-12-10 MED ORDER — FLUCONAZOLE 150 MG PO TABS: ORAL_TABLET | ORAL | Status: DC

## 2014-12-10 MED ORDER — PHENAZOPYRIDINE HCL 200 MG PO TABS: 200.0000 mg | ORAL_TABLET | Freq: Three times a day (TID) | ORAL | Status: DC | PRN

## 2014-12-10 MED ORDER — CIPROFLOXACIN HCL 250 MG PO TABS
250.0000 mg | ORAL_TABLET | Freq: Two times a day (BID) | ORAL | Status: DC
Start: 2014-12-10 — End: 2015-02-06

## 2014-12-10 NOTE — Progress Notes (Signed)
Pre visit review using our clinic review tool, if applicable. No additional management support is needed unless otherwise documented below in the visit note. 

## 2014-12-10 NOTE — Assessment & Plan Note (Signed)
Leuk in ua-- send culture cipro 250 mg bid x 3 days

## 2014-12-10 NOTE — Progress Notes (Signed)
Patient ID: [name], female    DOB: [birth year]  Age: 29 y.o. MRN: [id number]    Subjective:  Subjective HPI [name] presents for f/u ed for dysuria.    Review of Systems  Constitutional: Negative for activity change, appetite change, fatigue and unexpected weight change.  Respiratory: Negative for cough and shortness of breath.   Cardiovascular: Negative for chest pain and palpitations.  Psychiatric/Behavioral: Negative for behavioral problems and dysphoric mood. The patient is not nervous/anxious.     History Past Medical History  Diagnosis Date  . Gastric reflux   . Anxiety   . Basedow's disease   . Pyelonephritis   . History of nephrolithiasis   . Anxiety disorder   . Bipolar disorder   . Interstitial cystitis   . Chronic back pain   . Fibroid   . Barrett esophagus   . ADD (attention deficit disorder)   . Back pain     She has past surgical history that includes Tonsillectomy; Adenoidectomy; Tear duct probing; Cholecystectomy (N/A, 01/05/2014); and Esophagogastroduodenoscopy (egd) with propofol (N/A, 06/18/2014).   Her family history includes Bipolar disorder in her sister; Diabetes in an other family member; Hypertension in an other family member; Lung cancer in an other family member.She reports that she has been smoking Cigarettes.  She has a 4 pack-year smoking history. She has never used smokeless tobacco. She reports that she does not drink alcohol or use illicit drugs.  Current Outpatient Prescriptions on File Prior to Visit  Medication Sig Dispense Refill  . HYDROcodone-acetaminophen (NORCO) 10-325 MG per tablet Take 1 tablet by mouth every 6 (six) hours as needed for moderate pain or severe pain (pain). 30 tablet 0  . levonorgestrel-ethinyl estradiol (AVIANE,ALESSE,LESSINA) 0.1-20 MG-MCG tablet Take 1 tablet by mouth daily.    . pantoprazole (PROTONIX) 40 MG tablet Take 1 tablet (40 mg total) by mouth daily at 12 noon. 30 tablet 5   No current  facility-administered medications on file prior to visit.     Objective:  Objective Physical Exam  Constitutional: She is oriented to person, place, and time. She appears well-developed and well-nourished.  HENT:  Head: Normocephalic and atraumatic.  Eyes: Conjunctivae and EOM are normal.  Neck: Normal range of motion. Neck supple. No JVD present. Carotid bruit is not present. No thyromegaly present.  Cardiovascular: Normal rate, regular rhythm and normal heart sounds.   No murmur heard. Pulmonary/Chest: Effort normal and breath sounds normal. No respiratory distress. She has no wheezes. She has no rales. She exhibits no tenderness.  Musculoskeletal: She exhibits no edema.  Neurological: She is alert and oriented to person, place, and time.  Psychiatric: She has a normal mood and affect. Her behavior is normal.   BP 118/79 mmHg  Pulse 80  Temp(Src) 98 F (36.7 C) (Oral)  Ht 5\' 6"  (1.676 m)  Wt 154 lb 9.6 oz (70.126 kg)  BMI 24.96 kg/m2  SpO2 100%  LMP 11/16/2014 Wt Readings from Last 3 Encounters:  12/10/14 154 lb 9.6 oz (70.126 kg)  11/25/14 100 lb (45.36 kg)  10/07/14 148 lb (67.132 kg)   UA-- + leuk  Lab Results  Component Value Date   WBC 11.1* 11/25/2014   HGB 13.5 11/25/2014   HCT 41.2 11/25/2014   PLT 248 11/25/2014   GLUCOSE 103* 11/25/2014   ALT 16 11/25/2014   AST 17 11/25/2014   NA 136 11/25/2014   K 4.0 11/25/2014   CL 103 11/25/2014   CREATININE 0.64 11/25/2014  BUN 9 11/25/2014   CO2 27 11/25/2014   TSH 0.20* 12/04/2009   INR 1.11 06/17/2014    Ct Abdomen Pelvis W Contrast  11/25/2014   CLINICAL DATA:  Acute right lower quadrant abdominal pain for 1 day.  EXAM: CT ABDOMEN AND PELVIS WITH CONTRAST  TECHNIQUE: Multidetector CT imaging of the abdomen and pelvis was performed using the standard protocol following bolus administration of intravenous contrast.  CONTRAST:  69mL OMNIPAQUE IOHEXOL 300 MG/ML SOLN, 117mL OMNIPAQUE IOHEXOL 300 MG/ML SOLN   COMPARISON:  CT scan of June 16, 2014.  FINDINGS: Visualized lung bases appear normal. No significant osseous abnormality is noted.  Status post cholecystectomy. Stable mild intrahepatic and extrahepatic biliary dilatation is noted compared to prior exam, most likely related to post cholecystectomy status. No other focal abnormality is noted in the liver, spleen or pancreas. Adrenal glands and kidneys appear normal. No hydronephrosis or renal obstruction is noted. The appendix appears normal. There is no evidence of bowel obstruction. No abnormal fluid collection is noted. Uterus and ovaries appear normal. Urinary bladder appears normal. No significant adenopathy is noted.  IMPRESSION: No acute abnormality seen in the abdomen or pelvis.   Electronically Signed   By: Marijo Conception, M.D.   On: 11/25/2014 22:17     Assessment & Plan:  Plan I have discontinued Ms. Yakel's fluconazole, amphetamine-dextroamphetamine, ciprofloxacin, and sulfamethoxazole-trimethoprim. I am also having her start on amphetamine-dextroamphetamine, amphetamine-dextroamphetamine, amphetamine-dextroamphetamine, ciprofloxacin, fluconazole, and phenazopyridine. Additionally, I am having her maintain her levonorgestrel-ethinyl estradiol, pantoprazole, and HYDROcodone-acetaminophen.  Meds ordered this encounter  Medications  . amphetamine-dextroamphetamine (ADDERALL) 20 MG tablet    Sig: Take 1 tablet (20 mg total) by mouth 2 (two) times daily.    Dispense:  60 tablet    Refill:  0    Do not fill until December 24, 2014  . amphetamine-dextroamphetamine (ADDERALL) 20 MG tablet    Sig: Take 1 tablet (20 mg total) by mouth 2 (two) times daily.    Dispense:  60 tablet    Refill:  0    Do not fill until January 24, 2015  . amphetamine-dextroamphetamine (ADDERALL) 20 MG tablet    Sig: Take 1 tablet (20 mg total) by mouth 2 (two) times daily.    Dispense:  60 tablet    Refill:  0    Do not fill until Sept 12, 2016  . ciprofloxacin  (CIPRO) 250 MG tablet    Sig: Take 1 tablet (250 mg total) by mouth 2 (two) times daily.    Dispense:  6 tablet    Refill:  0  . fluconazole (DIFLUCAN) 150 MG tablet    Sig: 1 po qd x 1, may repeat in 3 days prn    Dispense:  2 tablet    Refill:  0  . phenazopyridine (PYRIDIUM) 200 MG tablet    Sig: Take 1 tablet (200 mg total) by mouth 3 (three) times daily as needed for pain.    Dispense:  6 tablet    Refill:  0    Problem List Items Addressed This Visit    Dysuria    Leuk in ua-- send culture cipro 250 mg bid x 3 days       Relevant Medications   ciprofloxacin (CIPRO) 250 MG tablet   fluconazole (DIFLUCAN) 150 MG tablet   phenazopyridine (PYRIDIUM) 200 MG tablet    Other Visit Diagnoses    Kidney pain    -  Primary    Relevant Orders  POCT Urinalysis Dipstick (Completed)    Urine Culture    Leukocytes in urine        Relevant Medications    fluconazole (DIFLUCAN) 150 MG tablet    Other Relevant Orders    Urine Culture    Attention deficit hyperactivity disorder (ADHD), unspecified ADHD type        Relevant Medications    amphetamine-dextroamphetamine (ADDERALL) 20 MG tablet    amphetamine-dextroamphetamine (ADDERALL) 20 MG tablet    amphetamine-dextroamphetamine (ADDERALL) 20 MG tablet       Follow-up: Return in about 6 months (around 06/11/2015), or if symptoms worsen or fail to improve.  Garnet Koyanagi, DO

## 2014-12-10 NOTE — Patient Instructions (Signed)

## 2014-12-11 LAB — URINE CULTURE
COLONY COUNT: NO GROWTH
ORGANISM ID, BACTERIA: NO GROWTH

## 2014-12-12 ENCOUNTER — Encounter: Payer: Self-pay | Admitting: Family Medicine

## 2015-01-05 ENCOUNTER — Emergency Department (HOSPITAL_COMMUNITY)
Admission: EM | Admit: 2015-01-05 | Discharge: 2015-01-05 | Disposition: A | Discharge status: Home / Self Care | Payer: 59 | Attending: Emergency Medicine | Admitting: Emergency Medicine

## 2015-01-05 ENCOUNTER — Encounter (HOSPITAL_COMMUNITY): Payer: Self-pay | Admitting: Emergency Medicine

## 2015-01-05 DIAGNOSIS — Z86018 Personal history of other benign neoplasm: Secondary | ICD-10-CM | POA: Diagnosis not present

## 2015-01-05 DIAGNOSIS — K219 Gastro-esophageal reflux disease without esophagitis: Secondary | ICD-10-CM | POA: Insufficient documentation

## 2015-01-05 DIAGNOSIS — W010XXA Fall on same level from slipping, tripping and stumbling without subsequent striking against object, initial encounter: Secondary | ICD-10-CM | POA: Diagnosis not present

## 2015-01-05 DIAGNOSIS — Z9049 Acquired absence of other specified parts of digestive tract: Secondary | ICD-10-CM | POA: Diagnosis not present

## 2015-01-05 DIAGNOSIS — F909 Attention-deficit hyperactivity disorder, unspecified type: Secondary | ICD-10-CM | POA: Insufficient documentation

## 2015-01-05 DIAGNOSIS — Z720 Tobacco use: Secondary | ICD-10-CM | POA: Insufficient documentation

## 2015-01-05 DIAGNOSIS — Y92008 Other place in unspecified non-institutional (private) residence as the place of occurrence of the external cause: Secondary | ICD-10-CM | POA: Diagnosis not present

## 2015-01-05 DIAGNOSIS — Y9389 Activity, other specified: Secondary | ICD-10-CM | POA: Insufficient documentation

## 2015-01-05 DIAGNOSIS — Z793 Long term (current) use of hormonal contraceptives: Secondary | ICD-10-CM | POA: Diagnosis not present

## 2015-01-05 DIAGNOSIS — G8929 Other chronic pain: Secondary | ICD-10-CM | POA: Insufficient documentation

## 2015-01-05 DIAGNOSIS — Y998 Other external cause status: Secondary | ICD-10-CM | POA: Diagnosis not present

## 2015-01-05 DIAGNOSIS — S3992XA Unspecified injury of lower back, initial encounter: Secondary | ICD-10-CM | POA: Insufficient documentation

## 2015-01-05 DIAGNOSIS — N3010 Interstitial cystitis (chronic) without hematuria: Secondary | ICD-10-CM

## 2015-01-05 DIAGNOSIS — Z79899 Other long term (current) drug therapy: Secondary | ICD-10-CM | POA: Diagnosis not present

## 2015-01-05 DIAGNOSIS — N390 Urinary tract infection, site not specified: Secondary | ICD-10-CM

## 2015-01-05 DIAGNOSIS — Z9851 Tubal ligation status: Secondary | ICD-10-CM | POA: Diagnosis not present

## 2015-01-05 DIAGNOSIS — M545 Low back pain, unspecified: Secondary | ICD-10-CM

## 2015-01-05 LAB — URINALYSIS, ROUTINE W REFLEX MICROSCOPIC
Bilirubin Urine: NEGATIVE
Glucose, UA: NEGATIVE mg/dL
Ketones, ur: NEGATIVE mg/dL
NITRITE: NEGATIVE
PH: 6 (ref 5.0–8.0)
PROTEIN: NEGATIVE mg/dL
Specific Gravity, Urine: 1.019 (ref 1.005–1.030)
Urobilinogen, UA: 0.2 mg/dL (ref 0.0–1.0)

## 2015-01-05 LAB — URINE MICROSCOPIC-ADD ON

## 2015-01-05 MED ORDER — OXYCODONE-ACETAMINOPHEN 5-325 MG PO TABS
1.0000 | ORAL_TABLET | Freq: Once | ORAL | Status: AC
  Administered 2015-01-05: 1 via ORAL
  Filled 2015-01-05: qty 1

## 2015-01-05 MED ORDER — KETOROLAC TROMETHAMINE 60 MG/2ML IM SOLN
60.0000 mg | Freq: Once | INTRAMUSCULAR | Status: AC
  Administered 2015-01-05: 60 mg via INTRAMUSCULAR
  Filled 2015-01-05: qty 2

## 2015-01-05 MED ORDER — NITROFURANTOIN MONOHYD MACRO 100 MG PO CAPS: 100.0000 mg | ORAL_CAPSULE | Freq: Two times a day (BID) | ORAL | Status: DC

## 2015-01-05 MED ORDER — ONDANSETRON 4 MG PO TBDP
4.0000 mg | ORAL_TABLET | Freq: Once | ORAL | Status: AC
  Administered 2015-01-05: 4 mg via ORAL
  Filled 2015-01-05: qty 1

## 2015-01-05 MED ORDER — LIDOCAINE 5 % EX PTCH: 1.0000 | MEDICATED_PATCH | CUTANEOUS | Status: DC

## 2015-01-05 NOTE — ED Notes (Addendum)
Pt stated that she tripped over a toy and re-injured her back this am. Pt was seen by Dr. Nelva Bush, PCP for acute episode of her chronic  back pain 3 day ago. Pt is currently on Hydrocodone 10/350 daily, Mobic was added on Friday due to increased pain. No decrease in pain. Pt is also c/o numbness and tingling down both legs. Pt is ambulatory, slow, with adequate balance

## 2015-01-05 NOTE — ED Provider Notes (Signed)
CSN: [id number]     Arrival date & time 01/05/15  1051 History  This chart was scribed for non-physician practitioner, Delos Haring, PA-C, working with Lajean Saver, MD, by Stephania Fragmin, ED Scribe. This patient was seen in room WTR7/WTR7 and the patient's care was started at 12:58 PM.    Chief Complaint  Patient presents with  . Back Pain    4 day hx-recurrent pain  . Fall    pt fell, took oxycodone 2 hours earlier  . Leg Pain    leg pain and numbness   The history is provided by the patient. No language interpreter was used.    HPI Comments: [name] is a 29 y.o. female with a history of chronic back pain and interstitial cystitis, who presents to the Emergency Department complaining of generalized back pain that is chronic in nature but which was exacerbated when she fell twice, once this morning after tripping over a toy, which exacerbated her back pain.. She also complains of shooting pains and numbness down her bilateral lower extremities, as well as decreased urination. Patient sees her PCP Dr. Nelva Bush for back pain. She has taken hydrocodone prescribed by Dr. Nelva Bush, with some relief and is on a pain contract. She reports she has a 23.29 year old who she is unable to pick up secondary to pain.  Patient notes a prior history of multiple UTI's since she was 29 years old. She has taken Macrobid and Levaquin in the past with success, but states the Cipro she had taken before causes yeast infections. She denies dysuria. No loss of bowel or urine control.  The patient denies diaphoresis, fever, headache, weakness (general or focal), confusion, change of vision,  neck pain, dysphagia, aphagia, chest pain, shortness of breath, abdominal pains, nausea, vomiting, diarrhea, lower extremity swelling, rash.  Past Medical History  Diagnosis Date  . Gastric reflux   . Anxiety   . Basedow's disease   . Pyelonephritis   . History of nephrolithiasis   . Anxiety disorder   . Bipolar disorder   .  Interstitial cystitis   . Chronic back pain   . Fibroid   . Barrett esophagus   . ADD (attention deficit disorder)   . Back pain    Past Surgical History  Procedure Laterality Date  . Tonsillectomy    . Adenoidectomy    . Tear duct probing    . Cholecystectomy N/A 01/05/2014    Procedure: LAPAROSCOPIC CHOLECYSTECTOMY WITH INTRAOPERATIVE CHOLANGIOGRAM;  Surgeon: Edward Jolly, MD;  Location: [facility];  Service: General;  Laterality: N/A;  . Esophagogastroduodenoscopy (egd) with propofol N/A 06/18/2014    Procedure: ESOPHAGOGASTRODUODENOSCOPY (EGD) WITH PROPOFOL;  Surgeon: Wonda Horner, MD;  Location: [facility] ENDOSCOPY;  Service: Endoscopy;  Laterality: N/A;   Family History  Problem Relation Age of Onset  . Diabetes    . Hypertension    . Lung cancer    . Bipolar disorder Sister    History  Substance Use Topics  . Smoking status: Current Some Day Smoker -- 0.50 packs/day for 8 years    Types: Cigarettes  . Smokeless tobacco: Never Used  . Alcohol Use: No   OB History    Gravida Para Term Preterm AB TAB SAB Ectopic Multiple Living   1 1 1       1      Review of Systems  Genitourinary: Positive for decreased urine volume. Negative for dysuria.  Musculoskeletal: Positive for back pain.  Neurological: Positive for numbness (  bilateral lower extremities).  A complete 10 system review of systems was obtained and all systems are negative except as noted in the HPI and PMH.    Allergies  Review of patient's allergies indicates no known allergies.  Home Medications   Prior to Admission medications   Medication Sig Start Date End Date Taking? Authorizing Provider  amphetamine-dextroamphetamine (ADDERALL) 20 MG tablet Take 1 tablet (20 mg total) by mouth 2 (two) times daily. 12/10/14   Rosalita Chessman, DO  amphetamine-dextroamphetamine (ADDERALL) 20 MG tablet Take 1 tablet (20 mg total) by mouth 2 (two) times daily. 12/10/14   Rosalita Chessman, DO  amphetamine-dextroamphetamine  (ADDERALL) 20 MG tablet Take 1 tablet (20 mg total) by mouth 2 (two) times daily. 12/10/14   Rosalita Chessman, DO  ciprofloxacin (CIPRO) 250 MG tablet Take 1 tablet (250 mg total) by mouth 2 (two) times daily. 12/10/14   Rosalita Chessman, DO  fluconazole (DIFLUCAN) 150 MG tablet 1 po qd x 1, may repeat in 3 days prn 12/10/14   Rosalita Chessman, DO  HYDROcodone-acetaminophen (NORCO) 10-325 MG per tablet Take 1 tablet by mouth every 6 (six) hours as needed for moderate pain or severe pain (pain). 10/07/14   Rosalita Chessman, DO  levonorgestrel-ethinyl estradiol (AVIANE,ALESSE,LESSINA) 0.1-20 MG-MCG tablet Take 1 tablet by mouth daily.    Historical Provider, MD  lidocaine (LIDODERM) 5 % Place 1 patch onto the skin daily. Remove & Discard patch within 12 hours or as directed by MD 01/05/15   Delos Haring, PA-C  lidocaine (LIDODERM) 5 % Place 1 patch onto the skin daily. Remove & Discard patch within 12 hours or as directed by MD 01/05/15   Delos Haring, PA-C  nitrofurantoin, macrocrystal-monohydrate, (MACROBID) 100 MG capsule Take 1 capsule (100 mg total) by mouth 2 (two) times daily. 01/05/15   Eiko Mcgowen Carlota Raspberry, PA-C  pantoprazole (PROTONIX) 40 MG tablet Take 1 tablet (40 mg total) by mouth daily at 12 noon. 06/24/14   Rosalita Chessman, DO  phenazopyridine (PYRIDIUM) 200 MG tablet Take 1 tablet (200 mg total) by mouth 3 (three) times daily as needed for pain. 12/10/14   Alferd Apa Lowne, DO   BP 113/81 mmHg  Pulse 65  Temp(Src) 98 F (36.7 C) (Oral)  Resp 18  SpO2 100%  LMP 12/22/2014 (Exact Date)  Breastfeeding? No Physical Exam  Constitutional: She is oriented to person, place, and time. She appears well-developed and well-nourished. No distress.  HENT:  Head: Normocephalic and atraumatic.  Eyes: Conjunctivae and EOM are normal.  Neck: Neck supple. No tracheal deviation present.  Cardiovascular: Normal rate.   Pulmonary/Chest: Effort normal. No respiratory distress.  Abdominal: Bowel sounds are normal. She  exhibits no distension. There is no tenderness. There is no rigidity and no rebound.  Musculoskeletal: Normal range of motion. She exhibits tenderness.  Pt has equal strength to bilateral lower extremities.  Neurosensory function adequate to both legs Skin color is normal. Skin is warm and moist.  I see no step off deformity, no midline bony tenderness.  Pt is able to ambulate. No crepitus, laceration, effusion, induration, lesions, swelling.   Pedal pulses are symmetrical and palpable bilaterally  No tenderness to palpation  paraspinel and midline spine.   Neurological: She is alert and oriented to person, place, and time.  Skin: Skin is warm and dry.  Psychiatric: She has a normal mood and affect. Her behavior is normal.  Nursing note and vitals reviewed.   ED Course  Procedures (  including critical care time)  DIAGNOSTIC STUDIES: Oxygen Saturation is 100% on RA, normal by my interpretation.    COORDINATION OF CARE: Labs Review Labs Reviewed  URINALYSIS, ROUTINE W REFLEX MICROSCOPIC (NOT AT [facility]) - Abnormal; Notable for the following:    APPearance CLOUDY (*)    Hgb urine dipstick SMALL (*)    Leukocytes, UA MODERATE (*)    All other components within normal limits  URINE MICROSCOPIC-ADD ON - Abnormal; Notable for the following:    Squamous Epithelial / LPF MANY (*)    Bacteria, UA MANY (*)    All other components within normal limits  URINE CULTURE   12:58 PM - Discussed lab results with pt - I suspect UTI. Pt notes a history of frequent UTI's in the past, noting she has had success with Macrobid. Discussed treatment plan with pt which includes Rx Macrobid and application of a Lidoderm patch to area of pain. F/u with PCP.  MDM   Final diagnoses:  Bilateral low back pain without sciatica  Interstitial cystitis  UTI (lower urinary tract infection)   28 y.o.Terria L Brahmbhatt's  with back pain. No neurological deficits and normal neuro exam. Patient can walk. No loss of  bowel or bladder control. No concern for cauda equina at this time base on HPI and physical exam findings. No fever, night sweats, weight loss, h/o cancer, IVDU.   Medications  ketorolac (TORADOL) injection 60 mg (60 mg Intramuscular Given 01/05/15 1255)  oxyCODONE-acetaminophen (PERCOCET/ROXICET) 5-325 MG per tablet 1 tablet (1 tablet Oral Given 01/05/15 1255)  ondansetron (ZOFRAN-ODT) disintegrating tablet 4 mg (4 mg Oral Given 01/05/15 1255)   Rx: Macrobid  Patient Plan 1. Medications: NSAIDs and muscle relaxer. Cont usual home medications unless otherwise directed. 2. Treatment: rest, drink plenty of fluids, gentle stretching as discussed, alternate ice and heat  3. Follow Up: Please followup with your primary doctor for discussion of your diagnoses and further evaluation after today's visit; if you do not have a primary care doctor use the resource guide provided to find one  Advised to follow-up with the orthopedist if symptoms do not start to resolve in the next 2-3 days. If develop loss of bowel or urinary control return to the ED as soon as possible for further evaluation. To take the medications as prescribed as they can cause harm if not taken appropriately.   Vital signs are stable at discharge. Filed Vitals:   01/05/15 1316  BP:   Pulse: 65  Temp:   Resp:     Patient/guardian has voiced understanding and agreed to follow-up with the PCP or specialist.   I personally performed the services described in this documentation, which was scribed in my presence. The recorded information has been reviewed and is accurate.   Delos Haring, PA-C 01/05/15 [place], PA-C 01/05/15 [place], MD 01/05/15 [phone]

## 2015-01-05 NOTE — Discharge Instructions (Signed)
Back Pain, Adult Low back pain is very common. About 1 in 5 people have back pain.The cause of low back pain is rarely dangerous. The pain often gets better over time.About half of people with a sudden onset of back pain feel better in just 2 weeks. About 8 in 10 people feel better by 6 weeks.  CAUSES Some common causes of back pain include:  Strain of the muscles or ligaments supporting the spine.  Wear and tear (degeneration) of the spinal discs.  Arthritis.  Direct injury to the back. DIAGNOSIS Most of the time, the direct cause of low back pain is not known.However, back pain can be treated effectively even when the exact cause of the pain is unknown.Answering your caregiver's questions about your overall health and symptoms is one of the most accurate ways to make sure the cause of your pain is not dangerous. If your caregiver needs more information, he or she may order lab work or imaging tests (X-rays or MRIs).However, even if imaging tests show changes in your back, this usually does not require surgery. HOME CARE INSTRUCTIONS For many people, back pain returns.Since low back pain is rarely dangerous, it is often a condition that people can learn to [facility] their own.   Remain active. It is stressful on the back to sit or stand in one place. Do not sit, drive, or stand in one place for more than 30 minutes at a time. Take short walks on level surfaces as soon as pain allows.Try to increase the length of time you walk each day.  Do not stay in bed.Resting more than 1 or 2 days can delay your recovery.  Do not avoid exercise or work.Your body is made to move.It is not dangerous to be active, even though your back may hurt.Your back will likely heal faster if you return to being active before your pain is gone.  Pay attention to your body when you bend and lift. Many people have less discomfortwhen lifting if they bend their knees, keep the load close to their bodies,and  avoid twisting. Often, the most comfortable positions are those that put less stress on your recovering back.  Find a comfortable position to sleep. Use a firm mattress and lie on your side with your knees slightly bent. If you lie on your back, put a pillow under your knees.  Only take over-the-counter or prescription medicines as directed by your caregiver. Over-the-counter medicines to reduce pain and inflammation are often the most helpful.Your caregiver may prescribe muscle relaxant drugs.These medicines help dull your pain so you can more quickly return to your normal activities and healthy exercise.  Put ice on the injured area.  Put ice in a plastic bag.  Place a towel between your skin and the bag.  Leave the ice on for 15-20 minutes, 03-04 times a day for the first 2 to 3 days. After that, ice and heat may be alternated to reduce pain and spasms.  Ask your caregiver about trying back exercises and gentle massage. This may be of some benefit.  Avoid feeling anxious or stressed.Stress increases muscle tension and can worsen back pain.It is important to recognize when you are anxious or stressed and learn ways to manage it.Exercise is a great option. SEEK MEDICAL CARE IF:  You have pain that is not relieved with rest or medicine.  You have pain that does not improve in 1 week.  You have new symptoms.  You are generally not feeling well. SEEK  IMMEDIATE MEDICAL CARE IF:   You have pain that radiates from your back into your legs.  You develop new bowel or bladder control problems.  You have unusual weakness or numbness in your arms or legs.  You develop nausea or vomiting.  You develop abdominal pain.  You feel faint. Document Released: 05/31/2005 Document Revised: 11/30/2011 Document Reviewed: 10/02/2013 [facility] Patient Information 2015 [place], [place]. This information is not intended to replace advice given to you by your health care provider. Make sure you  discuss any questions you have with your health care provider. Interstitial Cystitis Interstitial cystitis (IC) is a condition that results in discomfort or pain in the bladder and the surrounding pelvic region. The symptoms can be different from case to case and even in the same individual. People may experience:  Mild discomfort.  Pressure.  Tenderness.  Intense pain in the bladder and pelvic area. CAUSES  Because IC varies so much in symptoms and severity, people studying this disease believe it is not one but several diseases. Some caregivers use the term painful bladder syndrome (PBS) to describe cases with painful urinary symptoms. This may not meet the strictest definition of IC. The term IC / PBS includes all cases of urinary pain that cannot be connected to other causes, such as infection or urinary stones.  SYMPTOMS  Symptoms may include:  An urgent need to urinate.  A frequent need to urinate.  A combination of these symptoms. Pain may change in intensity as the bladder fills with urine or as it empties. Women's symptoms often get worse during menstruation. They may sometimes experience pain with vaginal intercourse. Some of the symptoms of IC / PBS seem like those of bacterial infection. Tests do not show infection. IC / PBS is far more common in women than in men.  DIAGNOSIS  The diagnosis of IC / PBS is based on:  Presence of pain related to the bladder, usually along with problems of frequency and urgency.  Not finding other diseases that could cause the symptoms.  Diagnostic tests that help rule out other diseases include:  Urinalysis.  Urine culture.  Cystoscopy.  Biopsy of the bladder wall.  Distension of the bladder under anesthesia.  Urine cytology.  Laboratory examination of prostate secretions. A biopsy is a tissue sample that can be looked at under a microscope. Samples of the bladder and urethra may be removed during a cystoscopy. A biopsy helps  rule out bladder cancer. TREATMENT  Scientists have not yet found a cure for IC / PBS. Patients with IC / PBS do not get better with antibiotic therapy. Caregivers cannot predict who will respond best to which treatment. Symptoms may disappear without explanation. Disappearing symptoms may coincide with an event such as a change in diet or treatment. Even when symptoms disappear, they may return after days, weeks, months, or years.  Because the causes of IC / PBS are unknown, current treatments are aimed at relieving symptoms. Many people are helped by one or a combination of the treatments. As researchers learn more about IC / PBS, the list of potential treatments will change. Patients should discuss their options with a caregiver. SURGERY  Surgery should be considered only if all available treatments have failed and the pain is disabling. Many approaches and techniques are used. Each approach has its own advantages and complications. Advantages and complications should be discussed with a urologist. Your caregiver may recommend consulting another urologist for a second opinion. Most caregivers are reluctant to operate  because the outcome is unpredictable. Some people still have symptoms after surgery.  People considering surgery should discuss the potential risks and benefits, side effects, and long- and short-term complications with their family, as well as with people who have already had the procedure. Surgery requires anesthesia, hospitalization, and in some cases weeks or months of recovery. As the complexity of the procedure increases, so do the chances for complications and for failure. HOME CARE INSTRUCTIONS   All drugs, even those sold over the counter, have side effects. Patients should always consult a caregiver before using any drug for an extended amount of time. Only take over-the-counter or prescription medicines for pain, discomfort, or fever as directed by your caregiver.  Many  patients feel that smoking makes their symptoms worse. How the by-products of tobacco that are excreted in the urine affect IC / PBS is unknown. Smoking is the major known cause of bladder cancer. One of the best things smokers can do for their bladder and their overall health is to quit.  Many patients feel that gentle stretching exercises help relieve IC / PBS symptoms.  Methods vary, but basically patients decide to empty their bladder at designated times and use relaxation techniques and distractions to keep to the schedule. Gradually, patients try to lengthen the time between scheduled voids. A diary in which to record voiding times is usually helpful in keeping track of progress. MAKE SURE YOU:   Understand these instructions.  Will watch your condition.  Will get help right away if you are not doing well or get worse. Document Released: 01/30/2004 Document Revised: 08/23/2011 Document Reviewed: 04/15/2008 [facility] Patient Information 2015 [place], [place]. This information is not intended to replace advice given to you by your health care provider. Make sure you discuss any questions you have with your health care provider.

## 2015-01-06 ENCOUNTER — Encounter (HOSPITAL_BASED_OUTPATIENT_CLINIC_OR_DEPARTMENT_OTHER): Payer: Self-pay | Admitting: *Deleted

## 2015-01-06 ENCOUNTER — Emergency Department (HOSPITAL_BASED_OUTPATIENT_CLINIC_OR_DEPARTMENT_OTHER)
Admission: EM | Admit: 2015-01-06 | Discharge: 2015-01-06 | Disposition: A | Discharge status: Home / Self Care | Payer: 59 | Attending: Emergency Medicine | Admitting: Emergency Medicine

## 2015-01-06 ENCOUNTER — Telehealth: Payer: Self-pay | Admitting: Family Medicine

## 2015-01-06 DIAGNOSIS — G8929 Other chronic pain: Secondary | ICD-10-CM | POA: Insufficient documentation

## 2015-01-06 DIAGNOSIS — Z720 Tobacco use: Secondary | ICD-10-CM | POA: Diagnosis not present

## 2015-01-06 DIAGNOSIS — Z79899 Other long term (current) drug therapy: Secondary | ICD-10-CM | POA: Diagnosis not present

## 2015-01-06 DIAGNOSIS — G8911 Acute pain due to trauma: Secondary | ICD-10-CM | POA: Diagnosis not present

## 2015-01-06 DIAGNOSIS — Z87442 Personal history of urinary calculi: Secondary | ICD-10-CM | POA: Insufficient documentation

## 2015-01-06 DIAGNOSIS — Z792 Long term (current) use of antibiotics: Secondary | ICD-10-CM | POA: Diagnosis not present

## 2015-01-06 DIAGNOSIS — Z793 Long term (current) use of hormonal contraceptives: Secondary | ICD-10-CM | POA: Insufficient documentation

## 2015-01-06 DIAGNOSIS — F909 Attention-deficit hyperactivity disorder, unspecified type: Secondary | ICD-10-CM | POA: Insufficient documentation

## 2015-01-06 DIAGNOSIS — Z87440 Personal history of urinary (tract) infections: Secondary | ICD-10-CM | POA: Diagnosis not present

## 2015-01-06 DIAGNOSIS — M545 Low back pain: Secondary | ICD-10-CM | POA: Diagnosis present

## 2015-01-06 DIAGNOSIS — Z8742 Personal history of other diseases of the female genital tract: Secondary | ICD-10-CM | POA: Insufficient documentation

## 2015-01-06 DIAGNOSIS — Z86018 Personal history of other benign neoplasm: Secondary | ICD-10-CM | POA: Diagnosis not present

## 2015-01-06 DIAGNOSIS — K219 Gastro-esophageal reflux disease without esophagitis: Secondary | ICD-10-CM | POA: Insufficient documentation

## 2015-01-06 DIAGNOSIS — M5440 Lumbago with sciatica, unspecified side: Secondary | ICD-10-CM | POA: Diagnosis not present

## 2015-01-06 DIAGNOSIS — M6281 Muscle weakness (generalized): Secondary | ICD-10-CM | POA: Diagnosis not present

## 2015-01-06 MED ORDER — CYCLOBENZAPRINE HCL 5 MG PO TABS: 5.0000 mg | ORAL_TABLET | Freq: Three times a day (TID) | ORAL | Status: DC | PRN

## 2015-01-06 NOTE — Discharge Instructions (Signed)

## 2015-01-06 NOTE — ED Notes (Addendum)
Pt c/o numerous falls over the last few days. Pt has been seen by her ortho MD Nelva Bush) for same. Pt was seen yesterday at [facility] ER for back pain and was found to have a UTI. Pt ambulating unassisted with slow, steady gait.
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
## 2015-01-06 NOTE — Telephone Encounter (Signed)
Caller name: Harman Relation to pt: self Call back number:[phone]- [place]:  Reason for call: Pt called stating that she has been seen for back pain before and was referred to an orthopedics and that the orthopedics are going to referred her to a Neurologist. Pt has not heard from them yet and is wanting to know what she can do so she can get earlier appt with them, so that she can go back to work ASAP. Please advise if there is anything that she can do.

## 2015-01-07 LAB — URINE CULTURE: Special Requests: NORMAL

## 2015-01-07 NOTE — Telephone Encounter (Signed)
Spoke with patient and made her aware she need to follow up with Ortho, she verbalized understanding. I made her aware if she needs a follow up with Dr.Lowne we would be more than happy to see her and she voiced understanding.   KP

## 2015-01-10 ENCOUNTER — Telehealth: Payer: Self-pay | Admitting: Family Medicine

## 2015-01-10 DIAGNOSIS — R29898 Other symptoms and signs involving the musculoskeletal system: Secondary | ICD-10-CM

## 2015-01-10 NOTE — Telephone Encounter (Signed)
Ref placed.      KP 

## 2015-01-10 NOTE — Telephone Encounter (Signed)
Caller name:[facility]yn[facility] Relationship to patient: Can be reached:[phone] Pharmacy:  Reason for call:Dr Ramos is requesting we refer the patient to [facility] for leg weakness. Please advise

## 2015-01-10 NOTE — Telephone Encounter (Signed)
To MD to review and advise      KP 

## 2015-01-10 NOTE — Telephone Encounter (Signed)
Ok to put referral in 

## 2015-02-03 ENCOUNTER — Ambulatory Visit
Admission: RE | Admit: 2015-02-03 | Discharge: 2015-02-03 | Disposition: A | Discharge status: Home / Self Care | Payer: 59 | Source: Ambulatory Visit | Attending: Neurology | Admitting: Neurology

## 2015-02-03 ENCOUNTER — Other Ambulatory Visit: Payer: Self-pay | Admitting: Neurology

## 2015-02-03 ENCOUNTER — Encounter: Payer: Self-pay | Admitting: Neurology

## 2015-02-03 ENCOUNTER — Ambulatory Visit (INDEPENDENT_AMBULATORY_CARE_PROVIDER_SITE_OTHER): Payer: 59 | Admitting: Neurology

## 2015-02-03 VITALS — BP 102/80 | HR 81 | Ht 66.0 in | Wt 147.4 lb

## 2015-02-03 DIAGNOSIS — M25551 Pain in right hip: Secondary | ICD-10-CM

## 2015-02-03 DIAGNOSIS — M5416 Radiculopathy, lumbar region: Secondary | ICD-10-CM | POA: Diagnosis not present
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
## 2015-02-03 NOTE — Patient Instructions (Signed)
Remember to drink plenty of fluid, eat healthy meals and do not skip any meals. Try to eat protein with a every meal and eat a healthy snack such as fruit or nuts in between meals. Try to keep a regular sleep-wake schedule and try to exercise daily, particularly in the form of walking, 20-30 minutes a day, if you can.   As far as diagnostic testing: MRi of the spine, Xr of the pelvis  I would like to see you back for emg/ncs, sooner if we need to. Please call us with any interim questions, concerns, problems, updates or refill requests.   Our phone number is [phone]. We also have an after hours call service for urgent matters and there is a physician on-call for urgent questions. For any emergencies you know to call 911 or go to the nearest emergency room

## 2015-02-04 ENCOUNTER — Telehealth: Payer: Self-pay

## 2015-02-04 NOTE — Telephone Encounter (Signed)
Pt called back to get xray result, as per the previous note I relayed the message that xray was normal.

## 2015-02-04 NOTE — Telephone Encounter (Signed)
LVM for patient to call office to receive X ray results, ok to inform patient that XR of her pelvis was normal.

## 2015-02-19 ENCOUNTER — Ambulatory Visit (INDEPENDENT_AMBULATORY_CARE_PROVIDER_SITE_OTHER): Payer: 59 | Admitting: Neurology

## 2015-02-19 ENCOUNTER — Ambulatory Visit (INDEPENDENT_AMBULATORY_CARE_PROVIDER_SITE_OTHER): Payer: Self-pay | Admitting: Neurology

## 2015-02-19 ENCOUNTER — Ambulatory Visit (INDEPENDENT_AMBULATORY_CARE_PROVIDER_SITE_OTHER): Payer: 59

## 2015-02-19 DIAGNOSIS — M5410 Radiculopathy, site unspecified: Secondary | ICD-10-CM

## 2015-02-19 DIAGNOSIS — M5416 Radiculopathy, lumbar region: Secondary | ICD-10-CM

## 2015-02-19 DIAGNOSIS — Z0289 Encounter for other administrative examinations: Secondary | ICD-10-CM

## 2015-02-19 DIAGNOSIS — M25551 Pain in right hip: Secondary | ICD-10-CM | POA: Diagnosis not present

## 2015-02-19 MED ORDER — GADOPENTETATE DIMEGLUMINE 469.01 MG/ML IV SOLN: 14.0000 mL | Freq: Once | INTRAVENOUS | Status: AC | PRN
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
## 2015-02-20 ENCOUNTER — Telehealth: Payer: Self-pay | Admitting: *Deleted

## 2015-02-20 NOTE — Telephone Encounter (Signed)
Spoke w/ pt about MRI results. Told her it showed no significant change from MRI back on 06/22/14. No pinched nerves or etiology for her symptoms. She verbalized understanding.
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
## 2015-02-22 NOTE — Progress Notes (Signed)
See procedure note.

## 2015-03-06 ENCOUNTER — Emergency Department (HOSPITAL_BASED_OUTPATIENT_CLINIC_OR_DEPARTMENT_OTHER)
Admission: EM | Admit: 2015-03-06 | Discharge: 2015-03-06 | Disposition: A | Discharge status: Home / Self Care | Payer: 59 | Attending: Emergency Medicine | Admitting: Emergency Medicine

## 2015-03-06 ENCOUNTER — Encounter (HOSPITAL_BASED_OUTPATIENT_CLINIC_OR_DEPARTMENT_OTHER): Payer: Self-pay | Admitting: Emergency Medicine

## 2015-03-06 DIAGNOSIS — Z79899 Other long term (current) drug therapy: Secondary | ICD-10-CM | POA: Insufficient documentation

## 2015-03-06 DIAGNOSIS — Z720 Tobacco use: Secondary | ICD-10-CM | POA: Diagnosis not present

## 2015-03-06 DIAGNOSIS — G8929 Other chronic pain: Secondary | ICD-10-CM | POA: Insufficient documentation

## 2015-03-06 DIAGNOSIS — F909 Attention-deficit hyperactivity disorder, unspecified type: Secondary | ICD-10-CM | POA: Diagnosis not present

## 2015-03-06 DIAGNOSIS — Z87448 Personal history of other diseases of urinary system: Secondary | ICD-10-CM | POA: Diagnosis not present

## 2015-03-06 DIAGNOSIS — Z8719 Personal history of other diseases of the digestive system: Secondary | ICD-10-CM | POA: Diagnosis not present

## 2015-03-06 DIAGNOSIS — Z86018 Personal history of other benign neoplasm: Secondary | ICD-10-CM | POA: Diagnosis not present

## 2015-03-06 DIAGNOSIS — R300 Dysuria: Secondary | ICD-10-CM | POA: Diagnosis present

## 2015-03-06 DIAGNOSIS — R109 Unspecified abdominal pain: Secondary | ICD-10-CM | POA: Insufficient documentation

## 2015-03-06 LAB — URINE MICROSCOPIC-ADD ON

## 2015-03-06 LAB — URINALYSIS, ROUTINE W REFLEX MICROSCOPIC
BILIRUBIN URINE: NEGATIVE
Glucose, UA: NEGATIVE mg/dL
KETONES UR: NEGATIVE mg/dL
Nitrite: NEGATIVE
Protein, ur: NEGATIVE mg/dL
Specific Gravity, Urine: 1.027 (ref 1.005–1.030)
UROBILINOGEN UA: 1 mg/dL (ref 0.0–1.0)
pH: 6 (ref 5.0–8.0)

## 2015-03-06 LAB — PREGNANCY, URINE: PREG TEST UR: NEGATIVE

## 2015-03-06 NOTE — ED Provider Notes (Signed)
CSN: [id number]     Arrival date & time 03/06/15  2115 History  This chart was scribed for Veryl Speak, MD by Julien Nordmann, ED Scribe. This patient was seen in room MH02/MH02 and the patient's care was started at 9:54 PM.    Chief Complaint  Patient presents with  . Dysuria      The history is provided by the patient. No language interpreter was used.   HPI Comments: [name] is a 29 y.o. female who presents to the Emergency Department complaining of constant, gradual worsening dysuria onset one week ago. She notes having lower abdominal pain that radiated to her right lower back, burning with urination and nausea associated with her symptoms. Pt states she recently had interstitial cystitis in the past and notes her symptoms feel similar to a bladder infection.  Past Medical History  Diagnosis Date  . Gastric reflux   . Anxiety   . Basedow's disease   . Pyelonephritis   . History of nephrolithiasis   . Anxiety disorder   . Bipolar disorder   . Interstitial cystitis   . Chronic back pain   . Fibroid   . Barrett esophagus   . ADD (attention deficit disorder)   . Back pain    Past Surgical History  Procedure Laterality Date  . Tonsillectomy    . Adenoidectomy    . Tear duct probing    . Cholecystectomy N/A 01/05/2014    Procedure: LAPAROSCOPIC CHOLECYSTECTOMY WITH INTRAOPERATIVE CHOLANGIOGRAM;  Surgeon: Edward Jolly, MD;  Location: [facility];  Service: General;  Laterality: N/A;  . Esophagogastroduodenoscopy (egd) with propofol N/A 06/18/2014    Procedure: ESOPHAGOGASTRODUODENOSCOPY (EGD) WITH PROPOFOL;  Surgeon: Wonda Horner, MD;  Location: [facility];  Service: Endoscopy;  Laterality: N/A;  . Wisdom tooth extraction     Family History  Problem Relation Age of Onset  . Diabetes    . Hypertension    . Lung cancer    . Bipolar disorder Sister    Social History  Substance Use Topics  . Smoking status: Current Some Day Smoker -- 1.00 packs/day for 8 years     Types: Cigarettes  . Smokeless tobacco: Never Used  . Alcohol Use: No   OB History    Gravida Para Term Preterm AB TAB SAB Ectopic Multiple Living   1 1 1       1      Review of Systems  A complete 10 system review of systems was obtained and all systems are negative except as noted in the HPI and PMH.    Allergies  Review of patient's allergies indicates no known allergies.  Home Medications   Prior to Admission medications   Medication Sig Start Date End Date Taking? Authorizing Provider  amphetamine-dextroamphetamine (ADDERALL) 20 MG tablet Take 1 tablet (20 mg total) by mouth 2 (two) times daily. 12/10/14   Rosalita Chessman, DO  HYDROcodone-acetaminophen (NORCO) 10-325 MG per tablet Take 1 tablet by mouth every 6 (six) hours as needed for moderate pain or severe pain (pain). 10/07/14   Rosalita Chessman, DO  levonorgestrel-ethinyl estradiol (AVIANE,ALESSE,LESSINA) 0.1-20 MG-MCG tablet Take 1 tablet by mouth daily.    Historical Provider, MD  meloxicam (MOBIC) 7.5 MG tablet Take 7.5 mg by mouth as needed. 01/03/15   Historical Provider, MD   Triage vitals: BP 143/101 mmHg  Pulse 86  Temp(Src) 98.2 F (36.8 C) (Oral)  Resp 18  Ht 5\' 6"  (1.676 m)  Wt 145 lb (65.772  kg)  BMI 23.41 kg/m2  SpO2 100%  LMP 02/20/2015 Physical Exam  Constitutional: She is oriented to person, place, and time. She appears well-developed and well-nourished. No distress.  HENT:  Head: Normocephalic and atraumatic.  Eyes: EOM are normal.  Neck: Normal range of motion.  Cardiovascular: Normal rate, regular rhythm and normal heart sounds.   Pulmonary/Chest: Effort normal and breath sounds normal.  Abdominal: Soft. She exhibits no distension. There is no tenderness.  TTP in the right flank.   Musculoskeletal: Normal range of motion.  Neurological: She is alert and oriented to person, place, and time.  Skin: Skin is warm and dry.  Psychiatric: She has a normal mood and affect. Judgment normal.  Nursing  note and vitals reviewed.   ED Course  Procedures  DIAGNOSTIC STUDIES: Oxygen Saturation is 100% on RA, normal by my interpretation.  COORDINATION OF CARE:  9:58 PM Discussed treatment plan which includes follow up with urologist with pt at bedside and pt agreed to plan.  Labs Review Labs Reviewed  URINALYSIS, ROUTINE W REFLEX MICROSCOPIC (NOT AT [facility])  PREGNANCY, URINE    Imaging Review No results found. I have personally reviewed and evaluated these images and lab results as part of my medical decision-making.   EKG Interpretation None      MDM   Final diagnoses:  None    Her urinalysis is unremarkable. Pregnancy test is negative. Patient with long history of chronic pain. I've seen her in the past and have suspected drug-seeking behavior. Upon reviewing the prescription database, she has been filling prescriptions for hydrocodone with great frequency. She has had no fewer than 150 hydrocodone tablets filled in the past 30 days. She needs to follow-up with her primary Dr. for further prescriptions.  I personally performed the services described in this documentation, which was scribed in my presence. The recorded information has been reviewed and is accurate.      Veryl Speak, MD 03/06/15 2245

## 2015-03-06 NOTE — Discharge Instructions (Signed)
Please follow-up with your primary Dr. for prescriptions for pain medications.   Flank Pain Flank pain refers to pain that is located on the side of the body between the upper abdomen and the back. The pain may occur over a short period of time (acute) or may be long-term or reoccurring (chronic). It may be mild or severe. Flank pain can be caused by many things. CAUSES  Some of the more common causes of flank pain include:  Muscle strains.   Muscle spasms.   A disease of your spine (vertebral disk disease).   A lung infection (pneumonia).   Fluid around your lungs (pulmonary edema).   A kidney infection.   Kidney stones.   A very painful skin rash caused by the chickenpox virus (shingles).   Gallbladder disease.  [place] care will depend on the cause of your pain. In general,  Rest as directed by your caregiver.  Drink enough fluids to keep your urine clear or pale yellow.  Only take over-the-counter or prescription medicines as directed by your caregiver. Some medicines may help relieve the pain.  Tell your caregiver about any changes in your pain.  Follow up with your caregiver as directed. SEEK IMMEDIATE MEDICAL CARE IF:   Your pain is not controlled with medicine.   You have new or worsening symptoms.  Your pain increases.   You have abdominal pain.   You have shortness of breath.   You have persistent nausea or vomiting.   You have swelling in your abdomen.   You feel faint or pass out.   You have blood in your urine.  You have a fever or persistent symptoms for more than 2-3 days.  You have a fever and your symptoms suddenly get worse. MAKE SURE YOU:   Understand these instructions.  Will watch your condition.  Will get help right away if you are not doing well or get worse. Document Released: 07/22/2005 Document Revised: 02/23/2012 Document Reviewed: 01/13/2012 [facility] Patient Information 2015  [place], [place]. This information is not intended to replace advice given to you by your health care provider. Make sure you discuss any questions you have with your health care provider.

## 2015-03-06 NOTE — ED Notes (Signed)
Pt states has had bladder infection symptoms for one week. Was trying to take leftover cipro prescription without relief. Now having low grade fever and back pain with nausea.

## 2015-03-20 ENCOUNTER — Telehealth: Payer: Self-pay | Admitting: Family Medicine

## 2015-03-20 ENCOUNTER — Ambulatory Visit (INDEPENDENT_AMBULATORY_CARE_PROVIDER_SITE_OTHER): Payer: 59 | Admitting: Family Medicine

## 2015-03-20 ENCOUNTER — Encounter: Payer: Self-pay | Admitting: Family Medicine

## 2015-03-20 ENCOUNTER — Ambulatory Visit (HOSPITAL_BASED_OUTPATIENT_CLINIC_OR_DEPARTMENT_OTHER)
Admission: RE | Admit: 2015-03-20 | Discharge: 2015-03-20 | Disposition: A | Discharge status: Home / Self Care | Payer: 59 | Source: Ambulatory Visit | Attending: Family Medicine | Admitting: Family Medicine

## 2015-03-20 VITALS — BP 110/66 | HR 95 | Temp 97.9°F | Wt 148.4 lb

## 2015-03-20 DIAGNOSIS — R319 Hematuria, unspecified: Secondary | ICD-10-CM | POA: Diagnosis not present

## 2015-03-20 DIAGNOSIS — R109 Unspecified abdominal pain: Secondary | ICD-10-CM

## 2015-03-20 DIAGNOSIS — F909 Attention-deficit hyperactivity disorder, unspecified type: Secondary | ICD-10-CM | POA: Diagnosis not present

## 2015-03-20 DIAGNOSIS — R310 Gross hematuria: Secondary | ICD-10-CM | POA: Diagnosis not present

## 2015-03-20 LAB — POCT URINALYSIS DIPSTICK
Bilirubin, UA: NEGATIVE
Glucose, UA: NEGATIVE
Leukocytes, UA: NEGATIVE
NITRITE UA: NEGATIVE
PH UA: 6
PROTEIN UA: NEGATIVE
Spec Grav, UA: >=1.03
UROBILINOGEN UA: 2

## 2015-03-20 MED ORDER — AMPHETAMINE-DEXTROAMPHETAMINE 20 MG PO TABS: 20.0000 mg | ORAL_TABLET | Freq: Two times a day (BID) | ORAL | Status: DC

## 2015-03-20 MED ORDER — NITROFURANTOIN MONOHYD MACRO 100 MG PO CAPS: 100.0000 mg | ORAL_CAPSULE | Freq: Two times a day (BID) | ORAL | Status: DC

## 2015-03-20 MED ORDER — KETOROLAC TROMETHAMINE 60 MG/2ML IM SOLN
60.0000 mg | Freq: Once | INTRAMUSCULAR | Status: AC
  Administered 2015-03-20: 60 mg via INTRAMUSCULAR

## 2015-03-20 NOTE — Telephone Encounter (Signed)
No kidney stone Macrobid 1 bid for 7 days ---- culture pending  Patient aware and verbalized understanding, she has agreed to take ABX and is requesting Zofran for the nausea. Please advise    KP

## 2015-03-20 NOTE — Progress Notes (Signed)
Pre visit review using our clinic review tool, if applicable. No additional management support is needed unless otherwise documented below in the visit note. 

## 2015-03-20 NOTE — Patient Instructions (Signed)
Kidney Stones °Kidney stones (urolithiasis) are deposits that form inside your kidneys. The intense pain is caused by the stone moving through the urinary tract. When the stone moves, the ureter goes into spasm around the stone. The stone is usually passed in the urine.  °CAUSES  °· A disorder that makes certain neck glands produce too much parathyroid hormone (primary hyperparathyroidism). °· A buildup of uric acid crystals, similar to gout in your joints. °· Narrowing (stricture) of the ureter. °· A kidney obstruction present at birth (congenital obstruction). °· Previous surgery on the kidney or ureters. °· Numerous kidney infections. °SYMPTOMS  °· Feeling sick to your stomach (nauseous). °· Throwing up (vomiting). °· Blood in the urine (hematuria). °· Pain that usually spreads (radiates) to the groin. °· Frequency or urgency of urination. °DIAGNOSIS  °· Taking a history and physical exam. °· Blood or urine tests. °· CT scan. °· Occasionally, an examination of the inside of the urinary bladder (cystoscopy) is performed. °TREATMENT  °· Observation. °· Increasing your fluid intake. °· Extracorporeal shock wave lithotripsy--This is a noninvasive procedure that uses shock waves to break up kidney stones. °· Surgery may be needed if you have severe pain or persistent obstruction. There are various surgical procedures. Most of the procedures are performed with the use of small instruments. Only small incisions are needed to accommodate these instruments, so recovery time is minimized. °The size, location, and chemical composition are all important variables that will determine the proper choice of action for you. Talk to your health care provider to better understand your situation so that you will minimize the risk of injury to yourself and your kidney.  °HOME CARE INSTRUCTIONS  °· Drink enough water and fluids to keep your urine clear or pale yellow. This will help you to pass the stone or stone fragments. °· Strain  all urine through the provided strainer. Keep all particulate matter and stones for your health care provider to see. The stone causing the pain may be as small as a grain of salt. It is very important to use the strainer each and every time you pass your urine. The collection of your stone will allow your health care provider to analyze it and verify that a stone has actually passed. The stone analysis will often identify what you can do to reduce the incidence of recurrences. °· Only take over-the-counter or prescription medicines for pain, discomfort, or fever as directed by your health care provider. °· Keep all follow-up visits as told by your health care provider. This is important. °· Get follow-up X-rays if required. The absence of pain does not always mean that the stone has passed. It may have only stopped moving. If the urine remains completely obstructed, it can cause loss of kidney function or even complete destruction of the kidney. It is your responsibility to make sure X-rays and follow-ups are completed. Ultrasounds of the kidney can show blockages and the status of the kidney. Ultrasounds are not associated with any radiation and can be performed easily in a matter of minutes. °· Make changes to your daily diet as told by your health care provider. You may be told to: °¨ Limit the amount of salt that you eat. °¨ Eat 5 or more servings of fruits and vegetables each day. °¨ Limit the amount of meat, poultry, fish, and eggs that you eat. °· Collect a 24-hour urine sample as told by your health care provider. You may need to collect another urine sample every 6-12   months. °SEEK MEDICAL CARE IF: °· You experience pain that is progressive and unresponsive to any pain medicine you have been prescribed. °SEEK IMMEDIATE MEDICAL CARE IF:  °· Pain cannot be controlled with the prescribed medicine. °· You have a fever or shaking chills. °· The severity or intensity of pain increases over 18 hours and is not  relieved by pain medicine. °· You develop a new onset of abdominal pain. °· You feel faint or pass out. °· You are unable to urinate. °  °This information is not intended to replace advice given to you by your health care provider. Make sure you discuss any questions you have with your health care provider. °  °Document Released: 05/31/2005 Document Revised: 02/19/2015 Document Reviewed: 11/01/2012 °Elsevier Interactive Patient Education ©2016 [employer] ° °

## 2015-03-20 NOTE — Telephone Encounter (Signed)
Imaging called to notify the CT results are in and they were STAT. Pt left and is unaware of the results. She did not want to wait.

## 2015-03-20 NOTE — Telephone Encounter (Signed)
zofran 8 mg #20 1 po q8h prn

## 2015-03-20 NOTE — Progress Notes (Signed)
Patient ID: [name], female   DOB: [birth year], 29 y.o.   MRN: [id number]   Subjective:    Patient ID: [name], female    DOB: [birth year], 29 y.o.   MRN: [id number]  Chief Complaint  Patient presents with  . Follow-up    Medication Management  . Flank Pain    c/o kidney pain x's a few weeks    HPI Patient is in today for f/u from ER for flank pain --- see ER visit.   She also needs refills  Past Medical History  Diagnosis Date  . Gastric reflux   . Anxiety   . Basedow's disease   . Pyelonephritis   . History of nephrolithiasis   . Anxiety disorder   . Bipolar disorder ([place])   . Interstitial cystitis   . Chronic back pain   . Fibroid   . Barrett esophagus   . ADD (attention deficit disorder)   . Back pain     Past Surgical History  Procedure Laterality Date  . Tonsillectomy    . Adenoidectomy    . Tear duct probing    . Cholecystectomy N/A 01/05/2014    Procedure: LAPAROSCOPIC CHOLECYSTECTOMY WITH INTRAOPERATIVE CHOLANGIOGRAM;  Surgeon: Edward Jolly, MD;  Location: [facility] OR[facility];  Service: General;  Laterality: N/A;  . Esophagogastroduodenoscopy (egd) with propofol N/A 06/18/2014    Procedure: ESOPHAGOGASTRODUODENOSCOPY (EGD) WITH PROPOFOL;  Surgeon: Wonda Horner, MD;  Location: [facility] ENDOSCOPY;  Service: Endoscopy;  Laterality: N/A;  . Wisdom tooth extraction      Family History  Problem Relation Age of Onset  . Diabetes    . Hypertension    . Lung cancer    . Bipolar disorder Sister     Social History   Social History  . Marital Status: Single    Spouse Name: N/A  . Number of Children: 1  . Years of Education: 12   Occupational History  . driver     [employer]   Social History Main Topics  . Smoking status: Current Some Day Smoker -- 1.00 packs/day for 8 years    Types: Cigarettes  . Smokeless tobacco: Never Used  . Alcohol Use: No  . Drug Use: No  . Sexual Activity: Not on file   Other Topics Concern  . Not on file   Social  History Narrative   Lives at home with mother, sister, and son (age 101)   Caffeine use: 2 soda's per day       Outpatient Prescriptions Prior to Visit  Medication Sig Dispense Refill  . HYDROcodone-acetaminophen (NORCO) 10-325 MG per tablet Take 1 tablet by mouth every 6 (six) hours as needed for moderate pain or severe pain (pain). 30 tablet 0  . levonorgestrel-ethinyl estradiol (AVIANE,ALESSE,LESSINA) 0.1-20 MG-MCG tablet Take 1 tablet by mouth daily.    . meloxicam (MOBIC) 7.5 MG tablet Take 7.5 mg by mouth as needed.    Marland Kitchen amphetamine-dextroamphetamine (ADDERALL) 20 MG tablet Take 1 tablet (20 mg total) by mouth 2 (two) times daily. 60 tablet 0   Facility-Administered Medications Prior to Visit  Medication Dose Route Frequency Provider Last Rate Last Dose  . gadopentetate dimeglumine (MAGNEVIST) injection 14 mL  14 mL Intravenous Once PRN Melvenia Beam, MD        No Known Allergies  Review of Systems  Constitutional: Negative for fever and malaise/fatigue.  HENT: Negative for congestion.   Eyes: Negative for discharge.  Respiratory: Negative for shortness of breath.  Cardiovascular: Negative for chest pain, palpitations and leg swelling.  Gastrointestinal: Negative for nausea and abdominal pain.  Genitourinary: Negative for dysuria.  Musculoskeletal: Positive for back pain. Negative for falls.  Skin: Negative for rash.  Neurological: Negative for loss of consciousness and headaches.  Endo/Heme/Allergies: Negative for environmental allergies.  Psychiatric/Behavioral: Negative for depression. The patient is not nervous/anxious.        Objective:    Physical Exam  Constitutional: She is oriented to person, place, and time. She appears well-developed and well-nourished. No distress.  Cardiovascular: Normal rate and normal heart sounds.   No murmur heard. Pulmonary/Chest: Effort normal and breath sounds normal. No respiratory distress. She has no wheezes. She has no rales.  She exhibits no tenderness.  Abdominal: Soft. She exhibits no distension. There is no tenderness (no suprapubic or CVA tenderness).  Musculoskeletal:       Arms: Neurological: She is alert and oriented to person, place, and time.  Psychiatric: She has a normal mood and affect. Her behavior is normal.  Vitals reviewed.   BP 110/66 mmHg  Pulse 95  Temp(Src) 97.9 F (36.6 C) (Oral)  Wt 148 lb 6.4 oz (67.314 kg)  SpO2 99%  LMP 03/13/2015  Breastfeeding? No Wt Readings from Last 3 Encounters:  03/20/15 148 lb 6.4 oz (67.314 kg)  03/06/15 145 lb (65.772 kg)  02/18/15 147 lb (66.679 kg)     Lab Results  Component Value Date   WBC 11.1* 11/25/2014   HGB 13.5 11/25/2014   HCT 41.2 11/25/2014   PLT 248 11/25/2014   GLUCOSE 103* 11/25/2014   ALT 16 11/25/2014   AST 17 11/25/2014   NA 136 11/25/2014   K 4.0 11/25/2014   CL 103 11/25/2014   CREATININE 0.64 11/25/2014   BUN 9 11/25/2014   CO2 27 11/25/2014   TSH 0.20* 12/04/2009   INR 1.11 06/17/2014    Lab Results  Component Value Date   TSH 0.20* 12/04/2009   Lab Results  Component Value Date   WBC 11.1* 11/25/2014   HGB 13.5 11/25/2014   HCT 41.2 11/25/2014   MCV 83.9 11/25/2014   PLT 248 11/25/2014   Lab Results  Component Value Date   NA 136 11/25/2014   K 4.0 11/25/2014   CO2 27 11/25/2014   GLUCOSE 103* 11/25/2014   BUN 9 11/25/2014   CREATININE 0.64 11/25/2014   BILITOT 0.6 11/25/2014   ALKPHOS 45 11/25/2014   AST 17 11/25/2014   ALT 16 11/25/2014   PROT 7.3 11/25/2014   ALBUMIN 4.3 11/25/2014   CALCIUM 9.2 11/25/2014   ANIONGAP 6 11/25/2014   GFR 78.41 02/28/2012   No results found for: CHOL No results found for: HDL No results found for: LDLCALC No results found for: TRIG No results found for: CHOLHDL No results found for: HGBA1C     Assessment & Plan:   Problem List Items Addressed This Visit    None    Visit Diagnoses    Flank pain    -  Primary    Relevant Medications     ketorolac (TORADOL) injection 60 mg (Completed)    Other Relevant Orders    POCT Urinalysis Dipstick (Completed)    Urine Culture    CBC with Differential/Platelet    Comp Met (CMET)    CT RENAL STONE STUDY (Completed)    Hematuria        Relevant Orders    POCT Urinalysis Dipstick (Completed)    Urine Culture  CBC with Differential/Platelet    Comp Met (CMET)    CT RENAL STONE STUDY (Completed)    Attention deficit hyperactivity disorder (ADHD), unspecified ADHD type        Relevant Medications    amphetamine-dextroamphetamine (ADDERALL) 20 MG tablet       I am having [name] start on amphetamine-dextroamphetamine and amphetamine-dextroamphetamine. I am also having her maintain her levonorgestrel-ethinyl estradiol, HYDROcodone-acetaminophen, meloxicam, and amphetamine-dextroamphetamine. We administered ketorolac.  Meds ordered this encounter  Medications  . amphetamine-dextroamphetamine (ADDERALL) 20 MG tablet    Sig: Take 1 tablet (20 mg total) by mouth 2 (two) times daily.    Dispense:  60 tablet    Refill:  0    Do not fill until October '16  . amphetamine-dextroamphetamine (ADDERALL) 20 MG tablet    Sig: Take 1 tablet (20 mg total) by mouth 2 (two) times daily. November '16    Dispense:  60 tablet    Refill:  0  . amphetamine-dextroamphetamine (ADDERALL) 20 MG tablet    Sig: Take 1 tablet (20 mg total) by mouth 2 (two) times daily. December '16    Dispense:  60 tablet    Refill:  0  . ketorolac (TORADOL) injection 60 mg    Sig:      Garnet Koyanagi, DO

## 2015-03-21 LAB — CBC WITH DIFFERENTIAL/PLATELET
BASOS PCT: 0.4 % (ref 0.0–3.0)
Basophils Absolute: 0 10*3/uL (ref 0.0–0.1)
EOS PCT: 0.8 % (ref 0.0–5.0)
Eosinophils Absolute: 0.1 10*3/uL (ref 0.0–0.7)
HEMATOCRIT: 43.2 % (ref 36.0–46.0)
HEMOGLOBIN: 13.8 g/dL (ref 12.0–15.0)
LYMPHS PCT: 17.2 % (ref 12.0–46.0)
Lymphs Abs: 2.3 10*3/uL (ref 0.7–4.0)
MCHC: 31.9 g/dL (ref 30.0–36.0)
MCV: 86.8 fl (ref 78.0–100.0)
MONOS PCT: 2.9 % — AB (ref 3.0–12.0)
Monocytes Absolute: 0.4 10*3/uL (ref 0.1–1.0)
Neutro Abs: 10.4 10*3/uL — ABNORMAL HIGH (ref 1.4–7.7)
Neutrophils Relative %: 78.7 % — ABNORMAL HIGH (ref 43.0–77.0)
Platelets: 225 10*3/uL (ref 150.0–400.0)
RBC: 4.97 Mil/uL (ref 3.87–5.11)
RDW: 14.1 % (ref 11.5–15.5)
WBC: 13.2 10*3/uL — AB (ref 4.0–10.5)

## 2015-03-21 LAB — COMPREHENSIVE METABOLIC PANEL
ALBUMIN: 4.5 g/dL (ref 3.5–5.2)
ALK PHOS: 38 U/L — AB (ref 39–117)
ALT: 9 U/L (ref 0–35)
AST: 15 U/L (ref 0–37)
BUN: 7 mg/dL (ref 6–23)
CALCIUM: 9.7 mg/dL (ref 8.4–10.5)
CHLORIDE: 101 meq/L (ref 96–112)
CO2: 27 mEq/L (ref 19–32)
CREATININE: 0.7 mg/dL (ref 0.40–1.20)
GFR: 105.09 mL/min (ref >60.00)
Glucose, Bld: 78 mg/dL (ref 70–99)
POTASSIUM: 4.3 meq/L (ref 3.5–5.1)
Sodium: 138 mEq/L (ref 135–145)
Total Bilirubin: 0.4 mg/dL (ref 0.2–1.2)
Total Protein: 7.5 g/dL (ref 6.0–8.3)

## 2015-03-21 LAB — URINE CULTURE
Colony Count: NO GROWTH
Organism ID, Bacteria: NO GROWTH

## 2015-03-24 MED ORDER — ONDANSETRON HCL 8 MG PO TABS
8.0000 mg | ORAL_TABLET | Freq: Three times a day (TID) | ORAL | Status: DC | PRN
Start: 2015-03-24 — End: 2015-04-15

## 2015-03-24 NOTE — Telephone Encounter (Signed)
Rx faxed, VM left making the patient aware.     KP

## 2015-03-27 ENCOUNTER — Ambulatory Visit: Payer: 59 | Admitting: Family Medicine

## 2015-03-27 ENCOUNTER — Ambulatory Visit: Payer: Self-pay | Admitting: Family Medicine

## 2015-03-27 ENCOUNTER — Telehealth: Payer: Self-pay | Admitting: Family Medicine

## 2015-03-27 DIAGNOSIS — Z0289 Encounter for other administrative examinations: Secondary | ICD-10-CM

## 2015-04-02 NOTE — Telephone Encounter (Signed)
Pt was no show 03/27/15 10:15am, follow up appt, pt has not rescheduled, charge or no charge?

## 2015-04-02 NOTE — Telephone Encounter (Signed)
charge 

## 2015-04-15 ENCOUNTER — Emergency Department (HOSPITAL_COMMUNITY)
Admission: EM | Admit: 2015-04-15 | Discharge: 2015-04-15 | Disposition: A | Discharge status: Home / Self Care | Payer: 59 | Attending: Emergency Medicine | Admitting: Emergency Medicine

## 2015-04-15 ENCOUNTER — Encounter (HOSPITAL_COMMUNITY): Payer: Self-pay | Admitting: *Deleted

## 2015-04-15 ENCOUNTER — Emergency Department (HOSPITAL_COMMUNITY): Payer: 59

## 2015-04-15 DIAGNOSIS — Z3202 Encounter for pregnancy test, result negative: Secondary | ICD-10-CM | POA: Insufficient documentation

## 2015-04-15 DIAGNOSIS — Z87442 Personal history of urinary calculi: Secondary | ICD-10-CM | POA: Diagnosis not present

## 2015-04-15 DIAGNOSIS — Z86018 Personal history of other benign neoplasm: Secondary | ICD-10-CM | POA: Insufficient documentation

## 2015-04-15 DIAGNOSIS — F909 Attention-deficit hyperactivity disorder, unspecified type: Secondary | ICD-10-CM | POA: Diagnosis not present

## 2015-04-15 DIAGNOSIS — Z9049 Acquired absence of other specified parts of digestive tract: Secondary | ICD-10-CM | POA: Diagnosis not present

## 2015-04-15 DIAGNOSIS — N390 Urinary tract infection, site not specified: Secondary | ICD-10-CM | POA: Insufficient documentation

## 2015-04-15 DIAGNOSIS — G8929 Other chronic pain: Secondary | ICD-10-CM | POA: Insufficient documentation

## 2015-04-15 DIAGNOSIS — K219 Gastro-esophageal reflux disease without esophagitis: Secondary | ICD-10-CM | POA: Insufficient documentation

## 2015-04-15 DIAGNOSIS — R1013 Epigastric pain: Secondary | ICD-10-CM

## 2015-04-15 DIAGNOSIS — Z8639 Personal history of other endocrine, nutritional and metabolic disease: Secondary | ICD-10-CM | POA: Diagnosis not present

## 2015-04-15 DIAGNOSIS — Z720 Tobacco use: Secondary | ICD-10-CM | POA: Insufficient documentation

## 2015-04-15 DIAGNOSIS — Z79899 Other long term (current) drug therapy: Secondary | ICD-10-CM | POA: Diagnosis not present

## 2015-04-15 DIAGNOSIS — R109 Unspecified abdominal pain: Secondary | ICD-10-CM | POA: Diagnosis present

## 2015-04-15 DIAGNOSIS — K2270 Barrett's esophagus without dysplasia: Secondary | ICD-10-CM | POA: Diagnosis not present

## 2015-04-15 LAB — CBC
HEMATOCRIT: 42.6 % (ref 36.0–46.0)
HEMOGLOBIN: 13.9 g/dL (ref 12.0–15.0)
MCH: 28.3 pg (ref 26.0–34.0)
MCHC: 32.6 g/dL (ref 30.0–36.0)
MCV: 86.8 fL (ref 78.0–100.0)
Platelets: 215 10*3/uL (ref 150–400)
RBC: 4.91 MIL/uL (ref 3.87–5.11)
RDW: 13.1 % (ref 11.5–15.5)
WBC: 13 10*3/uL — ABNORMAL HIGH (ref 4.0–10.5)

## 2015-04-15 LAB — COMPREHENSIVE METABOLIC PANEL
ALBUMIN: 4.1 g/dL (ref 3.5–5.0)
ALK PHOS: 42 U/L (ref 38–126)
ALT: 13 U/L — AB (ref 14–54)
ANION GAP: 6 (ref 5–15)
AST: 23 U/L (ref 15–41)
BILIRUBIN TOTAL: 0.8 mg/dL (ref 0.3–1.2)
BUN: 8 mg/dL (ref 6–20)
CALCIUM: 9.4 mg/dL (ref 8.9–10.3)
CO2: 25 mmol/L (ref 22–32)
Chloride: 105 mmol/L (ref 101–111)
Creatinine, Ser: 0.65 mg/dL (ref 0.44–1.00)
GFR calc Af Amer: >60 mL/min (ref >60)
GFR calc non Af Amer: >60 mL/min (ref >60)
GLUCOSE: 104 mg/dL — AB (ref 65–99)
Potassium: 4.8 mmol/L (ref 3.5–5.1)
SODIUM: 136 mmol/L (ref 135–145)
TOTAL PROTEIN: 7 g/dL (ref 6.5–8.1)

## 2015-04-15 LAB — URINALYSIS, ROUTINE W REFLEX MICROSCOPIC
Bilirubin Urine: NEGATIVE
GLUCOSE, UA: NEGATIVE mg/dL
HGB URINE DIPSTICK: NEGATIVE
Ketones, ur: NEGATIVE mg/dL
Nitrite: NEGATIVE
Protein, ur: NEGATIVE mg/dL
SPECIFIC GRAVITY, URINE: 1.016 (ref 1.005–1.030)
UROBILINOGEN UA: 0.2 mg/dL (ref 0.0–1.0)
pH: 8 (ref 5.0–8.0)

## 2015-04-15 LAB — POC URINE PREG, ED: Preg Test, Ur: NEGATIVE

## 2015-04-15 LAB — URINE MICROSCOPIC-ADD ON

## 2015-04-15 LAB — LIPASE, BLOOD: Lipase: 25 U/L (ref 11–51)

## 2015-04-15 LAB — CBG MONITORING, ED: Glucose-Capillary: 85 mg/dL (ref 65–99)

## 2015-04-15 MED ORDER — MORPHINE SULFATE (PF) 4 MG/ML IV SOLN
4.0000 mg | Freq: Once | INTRAVENOUS | Status: AC
  Administered 2015-04-15: 4 mg via INTRAVENOUS
  Filled 2015-04-15: qty 1

## 2015-04-15 MED ORDER — OXYCODONE-ACETAMINOPHEN 5-325 MG PO TABS
2.0000 | ORAL_TABLET | Freq: Once | ORAL | Status: AC
  Administered 2015-04-15: 2 via ORAL
  Filled 2015-04-15: qty 2

## 2015-04-15 MED ORDER — IOHEXOL 300 MG/ML  SOLN
100.0000 mL | Freq: Once | INTRAMUSCULAR | Status: AC | PRN
  Administered 2015-04-15: 100 mL via INTRAVENOUS

## 2015-04-15 MED ORDER — CEPHALEXIN 500 MG PO CAPS: 500.0000 mg | ORAL_CAPSULE | Freq: Four times a day (QID) | ORAL | Status: DC

## 2015-04-15 MED ORDER — GI COCKTAIL ~~LOC~~
30.0000 mL | Freq: Once | ORAL | Status: AC
  Administered 2015-04-15: 30 mL via ORAL
  Filled 2015-04-15: qty 30

## 2015-04-15 MED ORDER — PANTOPRAZOLE SODIUM 20 MG PO TBEC: 20.0000 mg | DELAYED_RELEASE_TABLET | Freq: Every day | ORAL | Status: DC

## 2015-04-15 MED ORDER — ONDANSETRON HCL 4 MG PO TABS: 4.0000 mg | ORAL_TABLET | Freq: Three times a day (TID) | ORAL | Status: DC | PRN

## 2015-04-15 MED ORDER — IOHEXOL 300 MG/ML  SOLN
50.0000 mL | Freq: Once | INTRAMUSCULAR | Status: AC | PRN
  Administered 2015-04-15: 50 mL via ORAL

## 2015-04-15 MED ORDER — ONDANSETRON 8 MG PO TBDP
8.0000 mg | ORAL_TABLET | Freq: Once | ORAL | Status: AC
  Administered 2015-04-15: 8 mg via ORAL
  Filled 2015-04-15: qty 1

## 2015-04-15 MED ORDER — ONDANSETRON HCL 4 MG/2ML IJ SOLN
4.0000 mg | Freq: Once | INTRAMUSCULAR | Status: AC
  Administered 2015-04-15: 4 mg via INTRAVENOUS
  Filled 2015-04-15: qty 2

## 2015-04-15 MED ORDER — SODIUM CHLORIDE 0.9 % IV BOLUS (SEPSIS)
500.0000 mL | Freq: Once | INTRAVENOUS | Status: AC
  Administered 2015-04-15: 500 mL via INTRAVENOUS

## 2015-04-15 NOTE — Discharge Instructions (Signed)
Abdominal Pain, Adult Many things can cause abdominal pain. Usually, abdominal pain is not caused by a disease and will improve without treatment. It can often be observed and treated at home. Your health care provider will do a physical exam and possibly order blood tests and X-rays to help determine the seriousness of your pain. However, in many cases, more time must pass before a clear cause of the pain can be found. Before that point, your health care provider may not know if you need more testing or further treatment. HOME CARE INSTRUCTIONS Monitor your abdominal pain for any changes. The following actions may help to alleviate any discomfort you are experiencing:  Only take over-the-counter or prescription medicines as directed by your health care provider.  Do not take laxatives unless directed to do so by your health care provider.  Try a clear liquid diet (broth, tea, or water) as directed by your health care provider. Slowly move to a bland diet as tolerated. SEEK MEDICAL CARE IF:  You have unexplained abdominal pain.  You have abdominal pain associated with nausea or diarrhea.  You have pain when you urinate or have a bowel movement.  You experience abdominal pain that wakes you in the night.  You have abdominal pain that is worsened or improved by eating food.  You have abdominal pain that is worsened with eating fatty foods.  You have a fever. SEEK IMMEDIATE MEDICAL CARE IF:  Your pain does not go away within 2 hours.  You keep throwing up (vomiting).  Your pain is felt only in portions of the abdomen, such as the right side or the left lower portion of the abdomen.  You pass bloody or black tarry stools. MAKE SURE YOU:  Understand these instructions.  Will watch your condition.  Will get help right away if you are not doing well or get worse.   This information is not intended to replace advice given to you by your health care provider. Make sure you discuss  any questions you have with your health care provider.   Document Released: 03/10/2005 Document Revised: 02/19/2015 Document Reviewed: 02/07/2013 Elsevier Interactive Patient Education 2016 [place]. Barrett's Esophagus  Barrett's esophagus occurs when the lining of the esophagus is damaged. The esophagus is the tube that carries food from the mouth to the stomach. With Barrett's esophagus, the lining of the esophagus gets replaced by material that is similar to the lining in the intestines. This process is called intestinal metaplasia. A small number of people with Barrett's esophagus develop esophageal cancer. CAUSES  The exact cause of Barrett's esophagus is unknown. SYMPTOMS  Most people with Barrett's esophagus do not have symptoms. However, many patients also have gastroesophageal reflux disease (GERD). GERD can cause heartburn, trouble swallowing, and a dry cough. DIAGNOSIS Barrett's esophagus is diagnosed by an exam called upper gastrointestinal endoscopy. A thin, flexible tube (endoscope) is passed down the esophagus. The endoscope has a light and camera on the end. Your caregiver uses the endoscope to view the inside of the esophagus. A tissue sample may also be taken and examined under a microscope (biopsy). If cancer cells are found during the biopsy, this condition is called dysplasia. TREATMENT  If you have no dysplasia or low-grade dysplasia, your caregiver may recommend no treatment or only taking medicines to treat GERD. Sometimes, taking acid-blocking drugs to treat GERD helps improve the tissue affected by Barrett's esophagus. Your caregiver may also recommend periodic esophageal exams. If you have high-grade dysplasia, treatment may  include removing the damaged parts of the esophagus. This can be done by heating, freezing, or surgically removing the tissue. In some cases, surgery may be done to remove most of the esophagus. The stomach is then attached to the remaining portion  of the esophagus. HOME CARE INSTRUCTIONS  Take acid-blocking drugs for GERD if recommended by your caregiver.  Keep all follow-up appointments as directed by your caregiver. You may need periodic esophageal exams. SEEK IMMEDIATE MEDICAL CARE IF:  You have chest pain.  You have trouble swallowing.  You vomit blood or material that looks like coffee grounds.  Your stools are bright red or dark.   This information is not intended to replace advice given to you by your health care provider. Make sure you discuss any questions you have with your health care provider.   Document Released: 08/21/2003 Document Revised: 11/30/2011 Document Reviewed: 08/10/2011 Elsevier Interactive Patient Education 2016 Elsevier Inc.   Urinary Tract Infection A urinary tract infection (UTI) can occur any place along the urinary tract. The tract includes the kidneys, ureters, bladder, and urethra. A type of germ called bacteria often causes a UTI. UTIs are often helped with antibiotic medicine.  HOME CARE   If given, take antibiotics as told by your doctor. Finish them even if you start to feel better.  Drink enough fluids to keep your pee (urine) clear or pale yellow.  Avoid tea, drinks with caffeine, and bubbly (carbonated) drinks.  Pee often. Avoid holding your pee in for a long time.  Pee before and after having sex (intercourse).  Wipe from front to back after you poop (bowel movement) if you are a woman. Use each tissue only once. GET HELP RIGHT AWAY IF:   You have back pain.  You have lower belly (abdominal) pain.  You have chills.  You feel sick to your stomach (nauseous).  You throw up (vomit).  Your burning or discomfort with peeing does not go away.  You have a fever.  Your symptoms are not better in 3 days. MAKE SURE YOU:   Understand these instructions.  Will watch your condition.  Will get help right away if you are not doing well or get worse.   This information is  not intended to replace advice given to you by your health care provider. Make sure you discuss any questions you have with your health care provider.   Document Released: 11/17/2007 Document Revised: 06/21/2014 Document Reviewed: 12/30/2011 Elsevier Interactive Patient Education [employer].

## 2015-04-15 NOTE — ED Provider Notes (Signed)
CSN: [id number]     Arrival date & time 04/15/15  1044 History   First MD Initiated Contact with Patient 04/15/15 1154     Chief Complaint  Patient presents with  . Abdominal Pain     (Consider location/radiation/quality/duration/timing/severity/associated sxs/prior Treatment) Patient is a 29 y.o. female presenting with abdominal pain. The history is provided by the patient.  Abdominal Pain Pain location: non-focal, left side of abdomen. Pain quality: sharp   Pain quality comment:  "twisting, turning" Pain radiates to:  Does not radiate Pain severity:  Moderate Onset quality:  Sudden Duration:  1 day Timing:  Constant Progression:  Waxing and waning Chronicity:  Recurrent (2-3 recent episodes of past several months, less severe than this episode) Context: awakening from sleep   Context: not alcohol use, not diet changes, not eating, not medication withdrawal, not recent illness, not sick contacts and not suspicious food intake   Relieved by:  Nothing Associated symptoms: chills and nausea   Associated symptoms: no constipation, no cough, no diarrhea, no dysuria, no fatigue, no fever, no hematemesis, no hematochezia, no hematuria, no melena, no shortness of breath, no vaginal bleeding, no vaginal discharge and no vomiting   Risk factors: NSAID use   Risk factors: no alcohol abuse, no aspirin use and not pregnant     Pt is a 29 y.o. Female with hx of GERD, UTI/pyelo, s/p cholecystectomy who presents to the ER with left sided abdominal pain which woke her up this morning, described as "waves of sharp, twisting, turning" constant pain with waxes and wanes in intensity, worse rated 7/10, and associated with N, hot and cold chills.  She has had similar episodes, although less severe, over the past 2-3 months.  She denies any V, diarrhea, constipation, dysuria, hematuria, vaginal discharge, melena, hematochezia.  Pt's LMP was approximately one week ago, states "normal" for her, and she denies  possiblity of pregnancy or STD with oral birth control and same partner for 8 years.  She denies ETOH use.  She states she has a history of Barrett's esophagus, has not been taking PPI, as she believed she should only take it if she was having symptoms of reflux, which she denies having.  She takes hydrocodone, ibuprofen and meloxicam several times a week   Past Medical History  Diagnosis Date  . Gastric reflux   . Anxiety   . Basedow's disease   . Pyelonephritis   . History of nephrolithiasis   . Anxiety disorder   . Bipolar disorder ([place])   . Interstitial cystitis   . Chronic back pain   . Fibroid   . Barrett esophagus   . ADD (attention deficit disorder)   . Back pain    Past Surgical History  Procedure Laterality Date  . Tonsillectomy    . Adenoidectomy    . Tear duct probing    . Cholecystectomy N/A 01/05/2014    Procedure: LAPAROSCOPIC CHOLECYSTECTOMY WITH INTRAOPERATIVE CHOLANGIOGRAM;  Surgeon: Edward Jolly, MD;  Location: [facility];  Service: General;  Laterality: N/A;  . Esophagogastroduodenoscopy (egd) with propofol N/A 06/18/2014    Procedure: ESOPHAGOGASTRODUODENOSCOPY (EGD) WITH PROPOFOL;  Surgeon: Wonda Horner, MD;  [facility] END[facility];  Service: Endoscopy;  Laterality: N/A;  . Wisdom tooth extraction     Family History  Problem Relation Age of Onset  . Diabetes    . Hypertension    . Lung cancer    . Bipolar disorder Sister    Social History  Substance Use Topics  .  Smoking status: Current Some Day Smoker -- 1.00 packs/day for 8 years    Types: Cigarettes  . Smokeless tobacco: Never Used  . Alcohol Use: No   OB History    Gravida Para Term Preterm AB TAB SAB Ectopic Multiple Living   1 1 1       1      Review of Systems  Constitutional: Positive for chills. Negative for fever, diaphoresis, activity change, appetite change, fatigue and unexpected weight change.  HENT: Negative.   Eyes: Negative.   Respiratory: Negative.  Negative for cough and  shortness of breath.   Cardiovascular: Negative.   Gastrointestinal: Positive for nausea and abdominal pain. Negative for vomiting, diarrhea, constipation, melena, hematochezia, abdominal distention, rectal pain and hematemesis.  Endocrine: Negative.   Genitourinary: Negative for dysuria, hematuria, vaginal bleeding and vaginal discharge.  Musculoskeletal: Negative.   Skin: Negative.   Neurological: Negative.   Hematological: Negative.   Psychiatric/Behavioral: Negative.       Allergies  Review of patient's allergies indicates no known allergies.  Home Medications   Prior to Admission medications   Medication Sig Start Date End Date Taking? Authorizing Provider  amphetamine-dextroamphetamine (ADDERALL) 20 MG tablet Take 1 tablet (20 mg total) by mouth 2 (two) times daily. 03/20/15  Yes Rosalita Chessman, DO  HYDROcodone-acetaminophen (NORCO) 10-325 MG per tablet Take 1 tablet by mouth every 6 (six) hours as needed for moderate pain or severe pain (pain). 10/07/14  Yes Alferd Apa Lowne, DO  meloxicam (MOBIC) 7.5 MG tablet Take 7.5 mg by mouth daily as needed for pain.   Yes Historical Provider, MD  Norgestimate-Ethinyl Estradiol Triphasic (TRI-PREVIFEM) 0.18/0.215/0.25 MG-35 MCG tablet Take 1 tablet by mouth daily.   Yes Historical Provider, MD  amphetamine-dextroamphetamine (ADDERALL) 20 MG tablet Take 1 tablet (20 mg total) by mouth 2 (two) times daily. November '16 Patient not taking: Reported on 04/15/2015 03/20/15   Rosalita Chessman, DO  amphetamine-dextroamphetamine (ADDERALL) 20 MG tablet Take 1 tablet (20 mg total) by mouth 2 (two) times daily. December '16 Patient not taking: Reported on 04/15/2015 03/20/15   Rosalita Chessman, DO  cephALEXin (KEFLEX) 500 MG capsule Take 1 capsule (500 mg total) by mouth 4 (four) times daily. 04/15/15   Delsa Grana, PA-C  nitrofurantoin, macrocrystal-monohydrate, (MACROBID) 100 MG capsule Take 1 capsule (100 mg total) by mouth 2 (two) times daily. Patient not  taking: Reported on 04/15/2015 03/20/15   Alferd Apa Lowne, DO  ondansetron (ZOFRAN) 4 MG tablet Take 1 tablet (4 mg total) by mouth every 8 (eight) hours as needed for nausea or vomiting. 04/15/15   Delsa Grana, PA-C  pantoprazole (PROTONIX) 20 MG tablet Take 1 tablet (20 mg total) by mouth daily. 04/15/15   Delsa Grana, PA-C   BP 113/65 mmHg  Pulse 82  Temp(Src) 98.1 F (36.7 C) (Oral)  Resp 16  SpO2 94%  LMP 04/12/2015 Physical Exam  Constitutional: She is oriented to person, place, and time. She appears well-developed and well-nourished. No distress.  HENT:  Head: Normocephalic and atraumatic.  Nose: Nose normal.  Mouth/Throat: Oropharynx is clear and moist. No oropharyngeal exudate.  Eyes: Conjunctivae and EOM are normal. Pupils are equal, round, and reactive to light. Right eye exhibits no discharge. Left eye exhibits no discharge. No scleral icterus.  Neck: Normal range of motion. No JVD present. No tracheal deviation present. No thyromegaly present.  Cardiovascular: Normal rate, regular rhythm, normal heart sounds and intact distal pulses.  Exam reveals no gallop and no  friction rub.   No murmur heard. Pulmonary/Chest: Effort normal and breath sounds normal. No respiratory distress. She has no wheezes. She has no rales. She exhibits no tenderness.  Abdominal: Soft. Normal appearance and bowel sounds are normal. She exhibits no distension, no ascites and no mass. There is no hepatosplenomegaly. There is generalized tenderness. There is guarding and CVA tenderness. There is no rigidity, no rebound, no tenderness at McBurney's point and negative Murphy's sign. No hernia.    Musculoskeletal: Normal range of motion. She exhibits no edema or tenderness.  Lymphadenopathy:    She has no cervical adenopathy.  Neurological: She is alert and oriented to person, place, and time. She has normal reflexes. No cranial nerve deficit. She exhibits normal muscle tone. Coordination normal.  Skin: Skin is  warm and dry. No rash noted. She is not diaphoretic. No erythema. No pallor.  Psychiatric: She has a normal mood and affect. Her behavior is normal. Judgment and thought content normal.  Nursing note and vitals reviewed.   ED Course  Procedures (including critical care time) Labs Review Labs Reviewed  COMPREHENSIVE METABOLIC PANEL - Abnormal; Notable for the following:    Glucose, Bld 104 (*)    ALT 13 (*)    All other components within normal limits  CBC - Abnormal; Notable for the following:    WBC 13.0 (*)    All other components within normal limits  URINALYSIS, ROUTINE W REFLEX MICROSCOPIC (NOT AT [facility]) - Abnormal; Notable for the following:    APPearance CLOUDY (*)    Leukocytes, UA SMALL (*)    All other components within normal limits  URINE MICROSCOPIC-ADD ON - Abnormal; Notable for the following:    Squamous Epithelial / LPF MANY (*)    Bacteria, UA FEW (*)    All other components within normal limits  URINE CULTURE  LIPASE, BLOOD  POC URINE PREG, ED  CBG MONITORING, ED    Imaging Review No results found. I have personally reviewed and evaluated these images and lab results as part of my medical decision-making.   EKG Interpretation None      MDM   Final diagnoses:  Epigastric pain  UTI (lower urinary tract infection)    Patient with sudden onset abdominal pain, which was present when she woke up this morning. She stated it comes in waves with a stabbing twisting quality. It is felt across her upper abdomen without radiation. She has had 2-3 episodes of the past couple months. She is s/p cholecystectomy, has a history of interstitial cystitis, she reports a history of Barrett's esophagus, is not on a PPI, and does have a GI referral and follow-up but has not been to them over the past several months.  She does use mobic, ibuprofen and Norco intermittently for muscle skeletal pain. She denies any alcohol use.    My exam patient had a leukocytosis of 13.0,  chemistry was unremarkable, and urinalysis was pending. She also states she does have a history of pyelonephritis and it does not feel like that. The pain does not seem to come and go in relationship to food although she may have some gastritis or esophagitis component.   CT of abdomen/pelvis was unremarkable for acute pathology. Patient was encouraged to continue to treat her GERD symptoms for Barrett's esophagus, and follow up with GI as her abdominal pain may have upper GI etiology, such as esophagitis, gastritis, PUD, with ibuprofen and meloxicam use, she does have risk for NSAID induced ulcers/gastritis.  The urinalysis was positive for leukocytes and few bacteria although it was contaminated.  With the abdominal pain, nausea and CVA tenderness, will treat  pt for UTI and have her f/up with her PCP.  Her abdominal pain and nausea was not well controlled with IV Zofran and morphine. Patient did not note any improvement with the GI cocktail, although she had successful PO trial and while being discharged the patient she asked for a oral dose of nausea and pain medicine.  ODT Zofran and Percocet were ordered. She does have a ride home.    She was discharged in satisfactory condition. Filed Vitals:   04/15/15 1108 04/15/15 1402  BP: 113/73 113/65  Pulse: 89 82  Temp: 98.1 F (36.7 C) 98.1 F (36.7 C)  TempSrc: Oral Oral  Resp: 16 16  SpO2: 100% 94%     Delsa Grana, PA-C 04/26/15 [place], MD 04/27/15 2150

## 2015-04-15 NOTE — ED Notes (Signed)
Patient transported to CT 

## 2015-04-15 NOTE — ED Notes (Signed)
Pt reports she has had similar episodes of upper abd pain in the past, last one over 1 month ago. Today pt started having upper abd pain and nausea, denies vomiting. Denies dysuria, or blood in urine or stool. Pain 7/10.

## 2015-04-15 NOTE — ED Notes (Signed)
Patient was alert, oriented and stable upon discharge. RN went over AVS and patient had no further questions. Pt called mother for transportation. Instructed not to drive on narcotics.

## 2015-04-17 LAB — URINE CULTURE: SPECIAL REQUESTS: NORMAL

## 2015-04-24 ENCOUNTER — Telehealth: Payer: Self-pay | Admitting: *Deleted

## 2015-04-24 NOTE — Telephone Encounter (Signed)
PA approved for 60 tabs per 30 days. JG//CMA

## 2015-05-05 ENCOUNTER — Encounter (HOSPITAL_COMMUNITY): Payer: Self-pay

## 2015-05-05 ENCOUNTER — Telehealth: Payer: Self-pay | Admitting: Family Medicine

## 2015-05-05 ENCOUNTER — Emergency Department (HOSPITAL_COMMUNITY)
Admission: EM | Admit: 2015-05-05 | Discharge: 2015-05-05 | Disposition: A | Discharge status: Home / Self Care | Payer: 59 | Attending: Emergency Medicine | Admitting: Emergency Medicine

## 2015-05-05 DIAGNOSIS — N760 Acute vaginitis: Secondary | ICD-10-CM | POA: Diagnosis not present

## 2015-05-05 DIAGNOSIS — Z86018 Personal history of other benign neoplasm: Secondary | ICD-10-CM | POA: Insufficient documentation

## 2015-05-05 DIAGNOSIS — K219 Gastro-esophageal reflux disease without esophagitis: Secondary | ICD-10-CM | POA: Insufficient documentation

## 2015-05-05 DIAGNOSIS — Z87448 Personal history of other diseases of urinary system: Secondary | ICD-10-CM | POA: Insufficient documentation

## 2015-05-05 DIAGNOSIS — Z79818 Long term (current) use of other agents affecting estrogen receptors and estrogen levels: Secondary | ICD-10-CM | POA: Diagnosis not present

## 2015-05-05 DIAGNOSIS — Z9049 Acquired absence of other specified parts of digestive tract: Secondary | ICD-10-CM | POA: Diagnosis not present

## 2015-05-05 DIAGNOSIS — Z3202 Encounter for pregnancy test, result negative: Secondary | ICD-10-CM | POA: Insufficient documentation

## 2015-05-05 DIAGNOSIS — Z8639 Personal history of other endocrine, nutritional and metabolic disease: Secondary | ICD-10-CM | POA: Insufficient documentation

## 2015-05-05 DIAGNOSIS — G8929 Other chronic pain: Secondary | ICD-10-CM | POA: Insufficient documentation

## 2015-05-05 DIAGNOSIS — B9689 Other specified bacterial agents as the cause of diseases classified elsewhere: Secondary | ICD-10-CM

## 2015-05-05 DIAGNOSIS — Z87442 Personal history of urinary calculi: Secondary | ICD-10-CM | POA: Insufficient documentation

## 2015-05-05 DIAGNOSIS — R1030 Lower abdominal pain, unspecified: Secondary | ICD-10-CM | POA: Diagnosis present

## 2015-05-05 DIAGNOSIS — N898 Other specified noninflammatory disorders of vagina: Secondary | ICD-10-CM

## 2015-05-05 DIAGNOSIS — F909 Attention-deficit hyperactivity disorder, unspecified type: Secondary | ICD-10-CM | POA: Insufficient documentation

## 2015-05-05 DIAGNOSIS — Z79899 Other long term (current) drug therapy: Secondary | ICD-10-CM | POA: Insufficient documentation

## 2015-05-05 DIAGNOSIS — F17210 Nicotine dependence, cigarettes, uncomplicated: Secondary | ICD-10-CM | POA: Insufficient documentation

## 2015-05-05 DIAGNOSIS — R110 Nausea: Secondary | ICD-10-CM

## 2015-05-05 LAB — URINALYSIS, ROUTINE W REFLEX MICROSCOPIC
Bilirubin Urine: NEGATIVE
GLUCOSE, UA: NEGATIVE mg/dL
KETONES UR: NEGATIVE mg/dL
LEUKOCYTES UA: NEGATIVE
NITRITE: NEGATIVE
PROTEIN: NEGATIVE mg/dL
Specific Gravity, Urine: 1.004 — ABNORMAL LOW (ref 1.005–1.030)
pH: 5.5 (ref 5.0–8.0)

## 2015-05-05 LAB — WET PREP, GENITAL
SPERM: NONE SEEN
TRICH WET PREP: NONE SEEN
YEAST WET PREP: NONE SEEN

## 2015-05-05 LAB — COMPREHENSIVE METABOLIC PANEL
ALT: 11 U/L — ABNORMAL LOW (ref 14–54)
AST: 17 U/L (ref 15–41)
Albumin: 4.1 g/dL (ref 3.5–5.0)
Alkaline Phosphatase: 35 U/L — ABNORMAL LOW (ref 38–126)
Anion gap: 11 (ref 5–15)
BILIRUBIN TOTAL: 0.4 mg/dL (ref 0.3–1.2)
BUN: 6 mg/dL (ref 6–20)
CHLORIDE: 104 mmol/L (ref 101–111)
CO2: 25 mmol/L (ref 22–32)
CREATININE: 0.67 mg/dL (ref 0.44–1.00)
Calcium: 9.4 mg/dL (ref 8.9–10.3)
Glucose, Bld: 103 mg/dL — ABNORMAL HIGH (ref 65–99)
POTASSIUM: 3.7 mmol/L (ref 3.5–5.1)
Sodium: 140 mmol/L (ref 135–145)
TOTAL PROTEIN: 7.3 g/dL (ref 6.5–8.1)

## 2015-05-05 LAB — CBC
HEMATOCRIT: 41.4 % (ref 36.0–46.0)
Hemoglobin: 13.3 g/dL (ref 12.0–15.0)
MCH: 28.1 pg (ref 26.0–34.0)
MCHC: 32.1 g/dL (ref 30.0–36.0)
MCV: 87.3 fL (ref 78.0–100.0)
PLATELETS: 232 10*3/uL (ref 150–400)
RBC: 4.74 MIL/uL (ref 3.87–5.11)
RDW: 12.9 % (ref 11.5–15.5)
WBC: 10.2 10*3/uL (ref 4.0–10.5)

## 2015-05-05 LAB — URINE MICROSCOPIC-ADD ON

## 2015-05-05 LAB — LIPASE, BLOOD: LIPASE: 27 U/L (ref 11–51)

## 2015-05-05 LAB — I-STAT BETA HCG BLOOD, ED (MC, WL, AP ONLY): I-stat hCG, quantitative: <5 m[IU]/mL (ref <5)

## 2015-05-05 MED ORDER — ONDANSETRON HCL 4 MG/2ML IJ SOLN
4.0000 mg | Freq: Once | INTRAMUSCULAR | Status: AC
  Administered 2015-05-05: 4 mg via INTRAVENOUS
  Filled 2015-05-05: qty 2

## 2015-05-05 MED ORDER — KETOROLAC TROMETHAMINE 30 MG/ML IJ SOLN
30.0000 mg | Freq: Once | INTRAMUSCULAR | Status: AC
  Administered 2015-05-05: 30 mg via INTRAVENOUS
  Filled 2015-05-05: qty 1

## 2015-05-05 MED ORDER — MORPHINE SULFATE (PF) 4 MG/ML IV SOLN
4.0000 mg | Freq: Once | INTRAVENOUS | Status: AC
  Administered 2015-05-05: 4 mg via INTRAVENOUS
  Filled 2015-05-05: qty 1

## 2015-05-05 MED ORDER — METRONIDAZOLE 500 MG PO TABS: 500.0000 mg | ORAL_TABLET | Freq: Two times a day (BID) | ORAL | Status: DC

## 2015-05-05 MED ORDER — ONDANSETRON 4 MG PO TBDP: 4.0000 mg | ORAL_TABLET | Freq: Three times a day (TID) | ORAL | Status: DC | PRN

## 2015-05-05 MED ORDER — DICYCLOMINE HCL 10 MG PO CAPS
20.0000 mg | ORAL_CAPSULE | Freq: Once | ORAL | Status: AC
  Administered 2015-05-05: 20 mg via ORAL
  Filled 2015-05-05: qty 2

## 2015-05-05 MED ORDER — SODIUM CHLORIDE 0.9 % IV BOLUS (SEPSIS)
1000.0000 mL | Freq: Once | INTRAVENOUS | Status: AC
  Administered 2015-05-05: 1000 mL via INTRAVENOUS

## 2015-05-05 MED ORDER — DICYCLOMINE HCL 20 MG PO TABS: 20.0000 mg | ORAL_TABLET | Freq: Two times a day (BID) | ORAL | Status: DC

## 2015-05-05 MED ORDER — PROMETHAZINE HCL 25 MG/ML IJ SOLN
12.5000 mg | Freq: Once | INTRAMUSCULAR | Status: AC
  Administered 2015-05-05: 12.5 mg via INTRAVENOUS
  Filled 2015-05-05: qty 1

## 2015-05-05 NOTE — Telephone Encounter (Signed)
Called to follow up, patient currently in ED.

## 2015-05-05 NOTE — Discharge Instructions (Signed)
You have been seen today for abdominal pain and nausea. Your lab tests showed no abnormalities. It is recommended that you follow up with your GI specialist as soon as possible on this manner. Follow up with PCP as needed. Return to ED should symptoms worsen.   Emergency Department Resource Guide 1) Find a Doctor and Pay Out of Pocket Although you won't have to find out who is covered by your insurance plan, it is a good idea to ask around and get recommendations. You will then need to call the office and see if the doctor you have chosen will accept you as a new patient and what types of options they offer for patients who are self-pay. Some doctors offer discounts or will set up payment plans for their patients who do not have insurance, but you will need to ask so you aren't surprised when you get to your appointment.  2) Contact Your Local Health Department Not all health departments have doctors that can see patients for sick visits, but many do, so it is worth a call to see if yours does. If you don't know where your local health department is, you can check in your phone book. The CDC also has a tool to help you locate your state's health department, and many state websites also have listings of all of their local health departments.  3) Find a [facility] If your illness is not likely to be very severe or complicated, you may want to try a walk in clinic. These are popping up all over the country in pharmacies, drugstores, and shopping centers. They're usually staffed by nurse practitioners or physician assistants that have been trained to treat common illnesses and complaints. They're usually fairly quick and inexpensive. However, if you have serious medical issues or chronic medical problems, these are probably not your best option.  No Primary Care Doctor: - Call Health Connect at  [phone] - they can help you locate a primary care doctor that  accepts your insurance, provides certain  services, etc. - Physician Referral Service- [phone]  Chronic Pain Problems: Organization         Address  Phone   Notes  [facility]  [phone] Patients need to be referred by their primary care doctor.   Medication Assistance: Organization         Address  Phone   Notes  [facility] Medication [facility] [address] [place], [place] [address] [phone] --Must be a resident of [facility] -- Must have NO insurance coverage whatsoever (no Medicaid/ Medicare, etc.) -- The pt. MUST have a primary care doctor that directs their care regularly and follows them in the community   MedAssist  [phone]   [employer]  [phone]    Agencies that provide inexpensive medical care: Organization         Address  Phone   Notes  [place]  [phone]   [facility]    [phone]   [facility] [place],  [address] [phone]   [place] [address] [place] [phone]   Planned Parenthood    [phone]   [facility]    [phone]   [place] and [address], [place] Phone:  [phone], Fax:  [phone] Hours of Operation:  9 am - 6 pm,  M-F.  Also accepts Medicaid/Medicare and self-pay.  [facility] for [place] [address], [address], [place] Phone: [phone], Fax: [phone]. Hours of Operation:  8:30 am - 5:30 pm, M-F.  Also accepts Medicaid and self-pay.  [facility] [address], [place] Phone: [phone]   [place], [place], [place] [phone], Ext. [phone] Mondays & Thursdays: 7-9 AM.  First 15 patients are seen on a first come, first serve basis.    [place] Providers:  Organization         Address  Phone   Notes  [facility] [address], [place] [phone] Also accepts self-pay patients.  [facility] [address], [place]  [phone]   [place], [address], [place] [phone]   [facility] [address] [place] [phone]   Lucianne Lei [address], [place]   [phone] Only accepts [place] Access [place] patients after they have their name applied to their card.   Self-Pay (no insurance) in [facility]:  Organization         Address  Phone   Notes  Sickle Cell Patients, [facility] [place] [phone]   [facility] [place] [phone]   [facility] [place]  [place], [place], [place] [phone]   [facility]/Dr. Osei-Bonsu  [address] [place] or [address], [place] [phone] Phone number for both [place] and [place] locations is the same.  [facility] [address], [place] [phone]   [facility] [address], [place] or [address] [phone]   [facility] [address], [place] [phone], phone; [phone], fax Sees patients 1st and 3rd Saturday of every month.  Must not qualify for public or private insurance (i.e. Medicaid, Medicare, [place] Health Choice, Veterans' Benefits)  Household income should be no more than 200% of the poverty level The clinic cannot treat you if you are pregnant or think you are pregnant  Sexually transmitted diseases are not treated at the clinic.    Dental Care: Organization         Address  Phone  Notes  [facility] Department of [facility] [phone] Accepts  children up to age 62 who are enrolled in [place] or [place]; pregnant women with a Medicaid card; and children who have applied for Medicaid or [place] Health Choice, but were declined, whose parents can pay a reduced fee at time of service.  [facility] Department of [facility]  [address], [place] [phone] Accepts children up to age 22 who are enrolled in [place] or [place]; pregnant women with a Medicaid card; and children who have applied for Medicaid or [place] Health Choice, but were declined, whose parents can pay a reduced fee at time of service.  [place] Adult Dental Access PROGRAM  [place] [phone] Patients are seen by appointment only. Walk-ins are not accepted. [place] will see patients 20 years of age and older. Monday -  Tuesday (8am-5pm) Most Wednesdays (8:30-5pm) $30 per visit, cash only  [facility] Adult Dental Access PROGRAM  [address], [facility] [phone] Patients are seen by appointment only. Walk-ins are not accepted. [place] will see patients 100 years of age and older. One Wednesday Evening (Monthly: Volunteer Based).  $30 per visit, cash only  [place]  [phone] for adults; Children under age 81, call Graduate Pediatric Dentistry at [phone]. Children aged 21-14, please call [phone] to request a pediatric application.  Dental services are provided in all areas of dental care including fillings, crowns and bridges, complete and partial dentures, implants, gum treatment, root canals, and extractions. Preventive care is also provided. Treatment is provided to both adults and children. Patients are selected via a lottery and there is often a waiting list.   [facility] [address], [place]  [phone] [email or web address]   [facility] [address] [place], [place] [phone], Ext. [phone] Second and  Fourth Thursday of each month, opens at 6:30 AM; Clinic ends at 9 AM.  Patients are seen on a first-come first-served basis, and a limited number are seen during each clinic.   [facility]  [address] Hillard Danker [place], [place] [phone]   Eligibility Requirements You must have lived in [place], [place], or [place] counties for at least the last three months.   You cannot be eligible for state or federal sponsored [employer], including [employer], [place], or [employer].   You generally cannot be eligible for healthcare insurance through your employer.    How to apply: Eligibility screenings are held every Tuesday and Wednesday afternoon from 1:00 pm until 4:00 pm. You do not need an appointment for the interview!  [facility] [address] [place], [place]   [place]  [place] Department  [place]  [phone]    Behavioral Health Resources in the Community: Intensive Outpatient Programs Organization         Address  Phone  Notes  [place]. [address] [place], [place] [phone]   [facility] Outpatient [address] [place], [place]   ADS: Alcohol & Drug Svcs [address], [place], [place]   [place] [address],  [place], [place] or [phone]   Substance Abuse Resources Organization         Address  Phone  Notes  Alcohol and Drug Services  [phone]   [place]  [phone]   [facility] [place]   Chinita Pester  [phone]   Residential & Outpatient Substance Abuse Program  [phone]   Psychological Services Organization         Address  Phone  Notes  [facility]  [place]  [phone]   [place]  [address] [place] or [phone]    Mobile Crisis Teams Organization         Address  Phone  Notes  [facility] Alternatives, Mobile Crisis Care Unit  [phone]   Assertive Psychotherapeutic Services  [address] [place], [place]   [place] [address], [place] [phone]    Self-Help/Support Groups Organization         Address  Phone  Notes  Mental Health Assoc. of [place] - variety of support groups  [place] Call for more information  Narcotics Anonymous (NA), [facility] [address], [employer] [place]  2 meetings at this location   Special educational needs teacher         Address  Phone  Notes  ASAP Residential Treatment [place],    [place]  [phone]   [facility]  [address] [place] [phone], [place], [place]   [place], [place] [phone] Admissions: 8am-3pm M-F  Incentives Substance [place] [address]    [place], [place] [phone]   [facility] [address] [place], [place], [place]   [facility] [address],  [place], [place]   [facility] Programs - Intensive Outpatient [address], [place], [place]   [facility] [place].,  [place], [place] [phone] or [phone]   [facility] Services ([facility]) [address] [place], [place] Accepts Medicaid  [facility] [employer] [address],  [place] [phone] Substance Abuse/Addiction Treatment   [facility] Organization         Address  Phone  Notes  [facility]  [phone]   Domenic Schwab, PhD [address] Arlis [place], [place]   [phone] or [phone]   [place], [place] [phone]   [facility] [address] [place], [place] [phone] Insurance/Medicaid/sponsorship through [facility] and Families [address], [address] [place]                                    [place], [place] [phone] [place] [address], [place] [phone]    Dr. Adele Schilder  [phone]   [facility] Dept. 1) [address] [place] 2) [place] 3)  [place] [address], [place] [phone]  [phone]   [place] [phone] or [phone] (After Hours)

## 2015-05-05 NOTE — ED Notes (Signed)
Pt c/o increasing upper abdominal pain, lower abdominal cramping, and n/v x "months."  Pain score 7/10.  Pt has not taken anything for symptoms.  Sts "it just keeps getting worse."  Pt has been seen several times for same.  Sts "I tried to call my primary and they told me to come here."

## 2015-05-05 NOTE — Telephone Encounter (Signed)
[facility] - Client [facility]     Patient Name: Quinita Talamante Initial Comment Caller states, when she eats she gets a pain in her stomach, it also makes her nauseated, sometimes it makes her vomit. Abd pain is now severe   DOB: Aug 05, 1985      Nurse Assessment  Nurse: Luther Parody, RN, Malachy Mood Date/Time (Eastern Time): 05/05/2015 2:11:58 PM  Confirm and document reason for call. If symptomatic, describe symptoms. ---Caller states that she has been having intermittent abd pain with nausea for the last few months and has been to the ed a few times for evaluation but the pain has continued. Pt has not been seen by her pcp.  Has the patient traveled out of the country within the last 30 days? ---Not Applicable  Does the patient have any new or worsening symptoms? ---Yes  Will a triage be completed? ---Yes  Related visit to physician within the last 2 weeks? ---No  Does the PT have any chronic conditions? (i.e. diabetes, asthma, etc.) ---Yes  List chronic conditions. ---ADD  Did the patient indicate they were pregnant? ---No  Is this a behavioral health call? ---No    Guidelines     Guideline Title Affirmed Question Affirmed Notes   Abdominal Pain - Upper [1] SEVERE pain (e.g., excruciating) AND [2] present > 1 hour    Final Disposition User   Go to ED Now Luther Parody, RN, Malachy Mood     Referrals   GO TO FACILITY UNDECIDED   Disagree/Comply: Comply

## 2015-05-05 NOTE — ED Provider Notes (Signed)
CSN: [id number]     Arrival date & time 05/05/15  1440 History   First MD Initiated Contact with Patient 05/05/15 1551     Chief Complaint  Patient presents with  . Abdominal Pain  . Emesis     (Consider location/radiation/quality/duration/timing/severity/associated sxs/prior Treatment) HPI   [name] is a 29 y.o. female, with a history of cholecystectomy, kidney stones, barrett esophagus, and Graves disease, presenting to the ED with lower abdominal pain that has been going on for months, but has been gone for a few weeks and started back up today. Pt states the pain increases with food intake. Rates the pain 7/10, cramping, and non-radiating. Pt also complains of nausea and white vaginal discharge beginning yesterday. Denies vomiting, diarrhea, fever/chills, urinary problems, hematochezia, or any other pain or complaints. Patient's GI specialist is Dr. Michail Sermon, but hasn't seen him for over a year and needs a referral. Pt states she tried to set up an appt with her PCP for this matter, but was told to come to the ED instead. Last menstrual cycle was one week ago.      Past Medical History  Diagnosis Date  . Gastric reflux   . Anxiety   . Basedow's disease   . Pyelonephritis   . History of nephrolithiasis   . Anxiety disorder   . Bipolar disorder ([place])   . Interstitial cystitis   . Chronic back pain   . Fibroid   . Barrett esophagus   . ADD (attention deficit disorder)   . Back pain    Past Surgical History  Procedure Laterality Date  . Tonsillectomy    . Adenoidectomy    . Tear duct probing    . Cholecystectomy N/A 01/05/2014    Procedure: LAPAROSCOPIC CHOLECYSTECTOMY WITH INTRAOPERATIVE CHOLANGIOGRAM;  Surgeon: Edward Jolly, MD;  Location: [facility];  Service: General;  Laterality: N/A;  . Esophagogastroduodenoscopy (egd) with propofol N/A 06/18/2014    Procedure: ESOPHAGOGASTRODUODENOSCOPY (EGD) WITH PROPOFOL;  Surgeon: Wonda Horner, MD;  Location: [facility]  ENDOSCOPY;  Service: Endoscopy;  Laterality: N/A;  . Wisdom tooth extraction     Family History  Problem Relation Age of Onset  . Diabetes    . Hypertension    . Lung cancer    . Bipolar disorder Sister    Social History  Substance Use Topics  . Smoking status: Current Some Day Smoker -- 1.00 packs/day for 8 years    Types: Cigarettes  . Smokeless tobacco: Never Used  . Alcohol Use: No   OB History    Gravida Para Term Preterm AB TAB SAB Ectopic Multiple Living   1 1 1       1      Review of Systems  Constitutional: Negative for fever, chills, diaphoresis and unexpected weight change.  Respiratory: Negative for cough, chest tightness and shortness of breath.   Cardiovascular: Negative for chest pain, palpitations and leg swelling.  Gastrointestinal: Positive for nausea and abdominal pain. Negative for vomiting, diarrhea and constipation.  Genitourinary: Positive for vaginal discharge. Negative for dysuria, flank pain and dyspareunia.  Musculoskeletal: Negative for back pain.  Skin: Negative for color change and pallor.  Neurological: Negative for dizziness, syncope, weakness and light-headedness.  All other systems reviewed and are negative.     Allergies  Review of patient's allergies indicates no known allergies.  Home Medications   Prior to Admission medications   Medication Sig Start Date End Date Taking? Authorizing Provider  amphetamine-dextroamphetamine (ADDERALL) 20 MG tablet Take  1 tablet (20 mg total) by mouth 2 (two) times daily. 03/20/15  Yes Rosalita Chessman, DO  HYDROcodone-acetaminophen (NORCO) 10-325 MG per tablet Take 1 tablet by mouth every 6 (six) hours as needed for moderate pain or severe pain (pain). 10/07/14  Yes Alferd Apa Lowne, DO  meloxicam (MOBIC) 7.5 MG tablet Take 7.5 mg by mouth daily as needed for pain.   Yes Historical Provider, MD  Norgestimate-Ethinyl Estradiol Triphasic (TRI-PREVIFEM) 0.18/0.215/0.25 MG-35 MCG tablet Take 1 tablet by mouth  daily.   Yes Historical Provider, MD  ondansetron (ZOFRAN) 4 MG tablet Take 1 tablet (4 mg total) by mouth every 8 (eight) hours as needed for nausea or vomiting. 04/15/15  Yes Delsa Grana, PA-C  pantoprazole (PROTONIX) 20 MG tablet Take 1 tablet (20 mg total) by mouth daily. 04/15/15  Yes Delsa Grana, PA-C  dicyclomine (BENTYL) 20 MG tablet Take 1 tablet (20 mg total) by mouth 2 (two) times daily. 05/05/15   Shawn C Joy, PA-C  metroNIDAZOLE (FLAGYL) 500 MG tablet Take 1 tablet (500 mg total) by mouth 2 (two) times daily. 05/05/15   Shawn C Joy, PA-C  ondansetron (ZOFRAN ODT) 4 MG disintegrating tablet Take 1 tablet (4 mg total) by mouth every 8 (eight) hours as needed for nausea or vomiting. 05/05/15   Shawn C Joy, PA-C   BP 126/89 mmHg  Pulse 79  Temp(Src) 97.9 F (36.6 C) (Oral)  Resp 16  SpO2 100%  LMP 04/30/2015 Physical Exam  Constitutional: She appears well-developed and well-nourished. No distress.  HENT:  Head: Normocephalic and atraumatic.  Eyes: Conjunctivae are normal. Pupils are equal, round, and reactive to light.  Cardiovascular: Normal rate, regular rhythm and normal heart sounds.   Pulmonary/Chest: Effort normal and breath sounds normal. No respiratory distress.  Abdominal: Soft. Normal appearance and bowel sounds are normal. There is tenderness in the right lower quadrant and suprapubic area. There is no CVA tenderness.  Genitourinary: Pelvic exam was performed with patient supine.  External genitalia normal Vagina with discharge - minimal thick, white and yellow discharge Cervix  abnormal  - Bluish hue to cervix negative for cervical motion tenderness Adnexa palpated, no masses or negative for tenderness noted Bladder palpated negative for tenderness Uterus palpated no masses or negative for tenderness Med Tech served as chaperone during exam.   Musculoskeletal: She exhibits no edema or tenderness.  Neurological: She is alert.  Skin: Skin is warm and dry. She is not  diaphoretic.  Nursing note and vitals reviewed.   ED Course  Pelvic exam Date/Time: 05/05/2015 5:37 PM Performed by: Lorayne Bender Authorized by: Arlean Hopping C Consent: Verbal consent obtained. Risks and benefits: risks, benefits and alternatives were discussed Consent given by: patient Patient understanding: patient states understanding of the procedure being performed Patient consent: the patient's understanding of the procedure matches consent given Procedure consent: procedure consent matches procedure scheduled Required items: required blood products, implants, devices, and special equipment available Patient identity confirmed: verbally with patient and arm band Time out: Immediately prior to procedure a "time out" was called to verify the correct patient, procedure, equipment, support staff and site/side marked as required. Local anesthesia used: no Patient sedated: no Patient tolerance: Patient tolerated the procedure well with no immediate complications   (including critical care time) Labs Review Labs Reviewed  WET PREP, GENITAL - Abnormal; Notable for the following:    Clue Cells Wet Prep HPF POC PRESENT (*)    WBC, Wet Prep HPF POC MANY (*)  All other components within normal limits  COMPREHENSIVE METABOLIC PANEL - Abnormal; Notable for the following:    Glucose, Bld 103 (*)    ALT 11 (*)    Alkaline Phosphatase 35 (*)    All other components within normal limits  URINALYSIS, ROUTINE W REFLEX MICROSCOPIC (NOT AT [facility]) - Abnormal; Notable for the following:    APPearance CLOUDY (*)    Specific Gravity, Urine 1.004 (*)    Hgb urine dipstick TRACE (*)    All other components within normal limits  URINE MICROSCOPIC-ADD ON - Abnormal; Notable for the following:    Squamous Epithelial / LPF 0-5 (*)    Bacteria, UA FEW (*)    All other components within normal limits  LIPASE, BLOOD  CBC  RPR  HIV ANTIBODY (ROUTINE TESTING)  I-STAT BETA HCG BLOOD, ED ([facility], [facility], [facility]  ONLY)  GC/CHLAMYDIA PROBE AMP ([place]) NOT AT [facility]    Imaging Review No results found. I have personally reviewed and evaluated these images and lab results as part of my medical decision-making.   EKG Interpretation None      MDM   Final diagnoses:  Lower abdominal pain  Nausea  Vaginal discharge  Bacterial vaginosis    [name] presents with lower abdominal pain accompanied by nausea that has been going on for months. Pt also complains of vaginal discharge for 1 days.   Findings and plan of care discussed with Davonna Belling, MD.  This is a chronic problem for this patient and she has been seen several times for the same complaint. Patient will be referred back to her previous GI specialist. Patient noted to have bacterial vaginosis on wet prep. These lab results and the plan of care were communicated with patient, who agreed with the plan. Patient was advised to come back to the ED should her pain return. Patient states that she is ready for and comfortable with discharge.  Lorayne Bender, PA-C 05/05/15 1931  Davonna Belling, MD 05/06/15 [phone]

## 2015-05-05 NOTE — ED Notes (Signed)
BLOOD SAMPLE WILL BE COLLECTED WHEN IV IS STARTED

## 2015-05-06 LAB — HIV ANTIBODY (ROUTINE TESTING W REFLEX): HIV SCREEN 4TH GENERATION: NONREACTIVE

## 2015-05-06 LAB — RPR: RPR Ser Ql: NONREACTIVE

## 2015-05-07 LAB — GC/CHLAMYDIA PROBE AMP (~~LOC~~) NOT AT ARMC
Chlamydia: NEGATIVE
Neisseria Gonorrhea: NEGATIVE

## 2015-06-19 ENCOUNTER — Emergency Department (HOSPITAL_COMMUNITY)
Admission: EM | Admit: 2015-06-19 | Discharge: 2015-06-19 | Disposition: A | Discharge status: Home / Self Care | Payer: 59 | Attending: Emergency Medicine | Admitting: Emergency Medicine

## 2015-06-19 ENCOUNTER — Encounter (HOSPITAL_COMMUNITY): Payer: Self-pay | Admitting: Emergency Medicine

## 2015-06-19 DIAGNOSIS — Z87442 Personal history of urinary calculi: Secondary | ICD-10-CM | POA: Insufficient documentation

## 2015-06-19 DIAGNOSIS — Z792 Long term (current) use of antibiotics: Secondary | ICD-10-CM | POA: Insufficient documentation

## 2015-06-19 DIAGNOSIS — F419 Anxiety disorder, unspecified: Secondary | ICD-10-CM | POA: Insufficient documentation

## 2015-06-19 DIAGNOSIS — Z86018 Personal history of other benign neoplasm: Secondary | ICD-10-CM | POA: Insufficient documentation

## 2015-06-19 DIAGNOSIS — Z3202 Encounter for pregnancy test, result negative: Secondary | ICD-10-CM | POA: Insufficient documentation

## 2015-06-19 DIAGNOSIS — G8929 Other chronic pain: Secondary | ICD-10-CM | POA: Insufficient documentation

## 2015-06-19 DIAGNOSIS — F909 Attention-deficit hyperactivity disorder, unspecified type: Secondary | ICD-10-CM | POA: Insufficient documentation

## 2015-06-19 DIAGNOSIS — N3001 Acute cystitis with hematuria: Secondary | ICD-10-CM | POA: Insufficient documentation

## 2015-06-19 DIAGNOSIS — Z79818 Long term (current) use of other agents affecting estrogen receptors and estrogen levels: Secondary | ICD-10-CM | POA: Insufficient documentation

## 2015-06-19 DIAGNOSIS — F319 Bipolar disorder, unspecified: Secondary | ICD-10-CM | POA: Insufficient documentation

## 2015-06-19 DIAGNOSIS — Z79899 Other long term (current) drug therapy: Secondary | ICD-10-CM | POA: Insufficient documentation

## 2015-06-19 DIAGNOSIS — K219 Gastro-esophageal reflux disease without esophagitis: Secondary | ICD-10-CM | POA: Insufficient documentation

## 2015-06-19 DIAGNOSIS — Z8639 Personal history of other endocrine, nutritional and metabolic disease: Secondary | ICD-10-CM | POA: Insufficient documentation

## 2015-06-19 DIAGNOSIS — Z9049 Acquired absence of other specified parts of digestive tract: Secondary | ICD-10-CM | POA: Insufficient documentation

## 2015-06-19 DIAGNOSIS — F17210 Nicotine dependence, cigarettes, uncomplicated: Secondary | ICD-10-CM | POA: Insufficient documentation

## 2015-06-19 LAB — LIPASE, BLOOD: LIPASE: 22 U/L (ref 11–51)

## 2015-06-19 LAB — URINE MICROSCOPIC-ADD ON

## 2015-06-19 LAB — URINALYSIS, ROUTINE W REFLEX MICROSCOPIC
GLUCOSE, UA: NEGATIVE mg/dL
KETONES UR: NEGATIVE mg/dL
Nitrite: NEGATIVE
PH: 5.5 (ref 5.0–8.0)
Protein, ur: 30 mg/dL — AB
Specific Gravity, Urine: 1.027 (ref 1.005–1.030)

## 2015-06-19 LAB — CBC
HEMATOCRIT: 46 % (ref 36.0–46.0)
Hemoglobin: 14.5 g/dL (ref 12.0–15.0)
MCH: 27.8 pg (ref 26.0–34.0)
MCHC: 31.5 g/dL (ref 30.0–36.0)
MCV: 88.3 fL (ref 78.0–100.0)
Platelets: 247 10*3/uL (ref 150–400)
RBC: 5.21 MIL/uL — ABNORMAL HIGH (ref 3.87–5.11)
RDW: 12.9 % (ref 11.5–15.5)
WBC: 11.8 10*3/uL — AB (ref 4.0–10.5)

## 2015-06-19 LAB — COMPREHENSIVE METABOLIC PANEL
ALBUMIN: 4.4 g/dL (ref 3.5–5.0)
ALT: 12 U/L — ABNORMAL LOW (ref 14–54)
AST: 17 U/L (ref 15–41)
Alkaline Phosphatase: 36 U/L — ABNORMAL LOW (ref 38–126)
Anion gap: 14 (ref 5–15)
BUN: 8 mg/dL (ref 6–20)
CHLORIDE: 104 mmol/L (ref 101–111)
CO2: 21 mmol/L — AB (ref 22–32)
Calcium: 9.5 mg/dL (ref 8.9–10.3)
Creatinine, Ser: 0.77 mg/dL (ref 0.44–1.00)
GFR calc Af Amer: >60 mL/min (ref >60)
GLUCOSE: 127 mg/dL — AB (ref 65–99)
POTASSIUM: 3.7 mmol/L (ref 3.5–5.1)
SODIUM: 139 mmol/L (ref 135–145)
Total Bilirubin: 0.7 mg/dL (ref 0.3–1.2)
Total Protein: 7.5 g/dL (ref 6.5–8.1)

## 2015-06-19 LAB — I-STAT BETA HCG BLOOD, ED (MC, WL, AP ONLY): I-stat hCG, quantitative: <5 m[IU]/mL (ref <5)

## 2015-06-19 MED ORDER — SODIUM CHLORIDE 0.9 % IV SOLN
1000.0000 mL | Freq: Once | INTRAVENOUS | Status: AC
  Administered 2015-06-19: 1000 mL via INTRAVENOUS

## 2015-06-19 MED ORDER — ONDANSETRON HCL 4 MG/2ML IJ SOLN
4.0000 mg | Freq: Once | INTRAMUSCULAR | Status: AC
  Administered 2015-06-19: 4 mg via INTRAVENOUS
  Filled 2015-06-19: qty 2

## 2015-06-19 MED ORDER — MORPHINE SULFATE (PF) 4 MG/ML IV SOLN
4.0000 mg | Freq: Once | INTRAVENOUS | Status: AC
Start: 2015-06-19 — End: 2015-06-19
  Administered 2015-06-19: 4 mg via INTRAVENOUS
  Filled 2015-06-19: qty 1

## 2015-06-19 MED ORDER — PROMETHAZINE HCL 25 MG PO TABS: 25.0000 mg | ORAL_TABLET | Freq: Four times a day (QID) | ORAL | Status: DC | PRN

## 2015-06-19 MED ORDER — METOCLOPRAMIDE HCL 5 MG/ML IJ SOLN
10.0000 mg | Freq: Once | INTRAMUSCULAR | Status: AC
  Administered 2015-06-19: 10 mg via INTRAVENOUS
  Filled 2015-06-19: qty 2

## 2015-06-19 MED ORDER — SODIUM CHLORIDE 0.9 % IV SOLN
1000.0000 mL | INTRAVENOUS | Status: DC
  Administered 2015-06-19: 1000 mL via INTRAVENOUS

## 2015-06-19 MED ORDER — IBUPROFEN 600 MG PO TABS: 600.0000 mg | ORAL_TABLET | Freq: Three times a day (TID) | ORAL | Status: DC | PRN

## 2015-06-19 MED ORDER — CEPHALEXIN 500 MG PO CAPS: 500.0000 mg | ORAL_CAPSULE | Freq: Three times a day (TID) | ORAL | Status: DC

## 2015-06-19 MED ORDER — KETOROLAC TROMETHAMINE 30 MG/ML IJ SOLN
30.0000 mg | Freq: Once | INTRAMUSCULAR | Status: AC
  Administered 2015-06-19: 30 mg via INTRAVENOUS
  Filled 2015-06-19: qty 1

## 2015-06-19 MED FILL — NORGESTIMATE-ETH ESTRADIOL: 0.18/0.215/ | 28 days supply | Qty: 28 | Fill #5

## 2015-06-19 NOTE — ED Notes (Signed)
Per pt, states abdominal pain and diarrhea for 3 days-states has been worked up in past for same symptoms

## 2015-06-19 NOTE — Discharge Instructions (Signed)

## 2015-06-19 NOTE — ED Provider Notes (Signed)
CSN: [id number]     Arrival date & time 06/19/15  0734 History   First MD Initiated Contact with Patient 06/19/15 [phone]     Chief Complaint  Patient presents with  . Abdominal Pain     (Consider location/radiation/quality/duration/timing/severity/associated sxs/prior Treatment) HPI Patient reports to 3 days of diarrhea and right-sided abdominal pain.  She denies flank pain.  No prior history of kidney stones.  At one time she was diagnosed with interstitial cystitis.  She does report frequent urinary tract infections.  She feels that her urine color is changed.  She denies dysuria.  Denies hematemesis.  No melena or hematochezia.  Her pain is moderate in severity.   Past Medical History  Diagnosis Date  . Gastric reflux   . Anxiety   . Basedow's disease   . Pyelonephritis   . History of nephrolithiasis   . Anxiety disorder   . Bipolar disorder ([place])   . Interstitial cystitis   . Chronic back pain   . Fibroid   . Barrett esophagus   . ADD (attention deficit disorder)   . Back pain    Past Surgical History  Procedure Laterality Date  . Tonsillectomy    . Adenoidectomy    . Tear duct probing    . Cholecystectomy N/A 01/05/2014    Procedure: LAPAROSCOPIC CHOLECYSTECTOMY WITH INTRAOPERATIVE CHOLANGIOGRAM;  Surgeon: Edward Jolly, MD;  Location: [facility];  Service: General;  Laterality: N/A;  . Esophagogastroduodenoscopy (egd) with propofol N/A 06/18/2014    Procedure: ESOPHAGOGASTRODUODENOSCOPY (EGD) WITH PROPOFOL;  Surgeon: Wonda Horner, MD;  Location: [facility] ENDOSCOPY;  Service: Endoscopy;  Laterality: N/A;  . Wisdom tooth extraction     Family History  Problem Relation Age of Onset  . Diabetes    . Hypertension    . Lung cancer    . Bipolar disorder Sister    Social History  Substance Use Topics  . Smoking status: Current Some Day Smoker -- 1.00 packs/day for 8 years    Types: Cigarettes  . Smokeless tobacco: Never Used  . Alcohol Use: No   OB History    Gravida Para  Term Preterm AB TAB SAB Ectopic Multiple Living   1 1 1       1      Review of Systems  All other systems reviewed and are negative.     Allergies  Review of patient's allergies indicates no known allergies.  Home Medications   Prior to Admission medications   Medication Sig Start Date End Date Taking? Authorizing Provider  amphetamine-dextroamphetamine (ADDERALL) 20 MG tablet Take 1 tablet (20 mg total) by mouth 2 (two) times daily. 03/20/15  Yes Rosalita Chessman, DO  HYDROcodone-acetaminophen (NORCO) 10-325 MG per tablet Take 1 tablet by mouth every 6 (six) hours as needed for moderate pain or severe pain (pain). 10/07/14  Yes Alferd Apa Lowne, DO  meloxicam (MOBIC) 7.5 MG tablet Take 7.5 mg by mouth daily as needed for pain.   Yes Historical Provider, MD  Norgestimate-Ethinyl Estradiol Triphasic (TRI-PREVIFEM) 0.18/0.215/0.25 MG-35 MCG tablet Take 1 tablet by mouth daily.   Yes Historical Provider, MD  pantoprazole (PROTONIX) 20 MG tablet Take 1 tablet (20 mg total) by mouth daily. 04/15/15  Yes Delsa Grana, PA-C  dicyclomine (BENTYL) 20 MG tablet Take 1 tablet (20 mg total) by mouth 2 (two) times daily. 05/05/15   Shawn C Joy, PA-C  metroNIDAZOLE (FLAGYL) 500 MG tablet Take 1 tablet (500 mg total) by mouth 2 (two) times daily. 05/05/15  Shawn C Joy, PA-C  ondansetron (ZOFRAN ODT) 4 MG disintegrating tablet Take 1 tablet (4 mg total) by mouth every 8 (eight) hours as needed for nausea or vomiting. 05/05/15   Shawn C Joy, PA-C  ondansetron (ZOFRAN) 4 MG tablet Take 1 tablet (4 mg total) by mouth every 8 (eight) hours as needed for nausea or vomiting. 04/15/15   Delsa Grana, PA-C   BP 103/62 mmHg  Pulse 75  Temp(Src) 97.4 F (36.3 C) (Oral)  Resp 22  SpO2 100%  LMP 05/29/2015 Physical Exam  Constitutional: She is oriented to person, place, and time. She appears well-developed and well-nourished. No distress.  HENT:  Head: Normocephalic and atraumatic.  Eyes: EOM are normal.  Neck:  Normal range of motion.  Cardiovascular: Normal rate, regular rhythm and normal heart sounds.   Pulmonary/Chest: Effort normal and breath sounds normal.  Abdominal: Soft. She exhibits no distension. There is no tenderness.  Musculoskeletal: Normal range of motion.  Neurological: She is alert and oriented to person, place, and time.  Skin: Skin is warm and dry.  Psychiatric: She has a normal mood and affect. Judgment normal.  Nursing note and vitals reviewed.   ED Course  Procedures (including critical care time) Labs Review Labs Reviewed  COMPREHENSIVE METABOLIC PANEL - Abnormal; Notable for the following:    CO2 21 (*)    Glucose, Bld 127 (*)    ALT 12 (*)    Alkaline Phosphatase 36 (*)    All other components within normal limits  CBC - Abnormal; Notable for the following:    WBC 11.8 (*)    RBC 5.21 (*)    All other components within normal limits  URINALYSIS, ROUTINE W REFLEX MICROSCOPIC (NOT AT [facility]) - Abnormal; Notable for the following:    Color, Urine AMBER (*)    APPearance TURBID (*)    Hgb urine dipstick LARGE (*)    Bilirubin Urine SMALL (*)    Protein, ur 30 (*)    Leukocytes, UA MODERATE (*)    All other components within normal limits  URINE MICROSCOPIC-ADD ON - Abnormal; Notable for the following:    Squamous Epithelial / LPF 6-30 (*)    Bacteria, UA MANY (*)    Casts GRANULAR CAST (*)    All other components within normal limits  LIPASE, BLOOD  I-STAT BETA HCG BLOOD, ED ([facility], [facility], [facility] ONLY)    Imaging Review No results found. I have personally reviewed and evaluated these images and lab results as part of my medical decision-making.   EKG Interpretation None      MDM   Final diagnoses:  None    Overall well appearing.  Urine culture sent.  Patient be treated for urinary tract infection this time.  No indication for imaging.  Doubt ureteral stone.  Home with anti-inflammatories and Keflex.  She will need follow-up with her primary care  physician.    Jola Schmidt, MD 06/19/15 [phone]

## 2015-06-23 ENCOUNTER — Telehealth: Payer: Self-pay | Admitting: Family Medicine

## 2015-06-23 DIAGNOSIS — F909 Attention-deficit hyperactivity disorder, unspecified type: Secondary | ICD-10-CM

## 2015-06-23 MED ORDER — AMPHETAMINE-DEXTROAMPHETAMINE 20 MG PO TABS: 20.0000 mg | ORAL_TABLET | Freq: Two times a day (BID) | ORAL | Status: DC

## 2015-06-23 NOTE — Telephone Encounter (Signed)
Refill x 1  uds

## 2015-06-23 NOTE — Telephone Encounter (Signed)
VM left making the patient aware Rx will be ready in the morning,      KP

## 2015-06-23 NOTE — Telephone Encounter (Signed)
Pt called for refill on adderall. She takes 2/day. Has 2 left. Advised her to call 3-5 days ahead. Call when RX is ready for pick up at [phone].

## 2015-06-23 NOTE — Telephone Encounter (Signed)
Last seen and filled 03/20/15 #60 UDS 05/01/14 low risk   Please advise    KP

## 2015-06-24 MED FILL — HYDROCODON-APAP 10-325: 10-325 | 30 days supply | Qty: 60 | Fill #0

## 2015-06-24 MED FILL — MELOXICAM 7.5 MG TABLET: 7.5 | 30 days supply | Qty: 60 | Fill #0

## 2015-06-24 MED FILL — AMPHETAMINE SALTS 20 MG TAB: 20 | 30 days supply | Qty: 60 | Fill #0

## 2015-07-08 ENCOUNTER — Emergency Department (HOSPITAL_BASED_OUTPATIENT_CLINIC_OR_DEPARTMENT_OTHER)
Admission: EM | Admit: 2015-07-08 | Discharge: 2015-07-08 | Disposition: A | Discharge status: Home / Self Care | Payer: 59 | Attending: Emergency Medicine | Admitting: Emergency Medicine

## 2015-07-08 ENCOUNTER — Encounter (HOSPITAL_BASED_OUTPATIENT_CLINIC_OR_DEPARTMENT_OTHER): Payer: Self-pay | Admitting: Emergency Medicine

## 2015-07-08 DIAGNOSIS — Z8742 Personal history of other diseases of the female genital tract: Secondary | ICD-10-CM | POA: Insufficient documentation

## 2015-07-08 DIAGNOSIS — Z793 Long term (current) use of hormonal contraceptives: Secondary | ICD-10-CM | POA: Insufficient documentation

## 2015-07-08 DIAGNOSIS — Z86018 Personal history of other benign neoplasm: Secondary | ICD-10-CM | POA: Insufficient documentation

## 2015-07-08 DIAGNOSIS — F17210 Nicotine dependence, cigarettes, uncomplicated: Secondary | ICD-10-CM | POA: Insufficient documentation

## 2015-07-08 DIAGNOSIS — Z792 Long term (current) use of antibiotics: Secondary | ICD-10-CM | POA: Insufficient documentation

## 2015-07-08 DIAGNOSIS — F909 Attention-deficit hyperactivity disorder, unspecified type: Secondary | ICD-10-CM | POA: Insufficient documentation

## 2015-07-08 DIAGNOSIS — Z8639 Personal history of other endocrine, nutritional and metabolic disease: Secondary | ICD-10-CM | POA: Insufficient documentation

## 2015-07-08 DIAGNOSIS — Z79899 Other long term (current) drug therapy: Secondary | ICD-10-CM | POA: Insufficient documentation

## 2015-07-08 DIAGNOSIS — K219 Gastro-esophageal reflux disease without esophagitis: Secondary | ICD-10-CM | POA: Insufficient documentation

## 2015-07-08 DIAGNOSIS — Z87442 Personal history of urinary calculi: Secondary | ICD-10-CM | POA: Insufficient documentation

## 2015-07-08 DIAGNOSIS — G43001 Migraine without aura, not intractable, with status migrainosus: Secondary | ICD-10-CM

## 2015-07-08 DIAGNOSIS — G8929 Other chronic pain: Secondary | ICD-10-CM | POA: Insufficient documentation

## 2015-07-08 MED ORDER — MAGNESIUM SULFATE 2 GM/50ML IV SOLN
2.0000 g | Freq: Once | INTRAVENOUS | Status: AC
  Administered 2015-07-08: 2 g via INTRAVENOUS
  Filled 2015-07-08: qty 50

## 2015-07-08 MED ORDER — DIAZEPAM 5 MG PO TABS
5.0000 mg | ORAL_TABLET | Freq: Once | ORAL | Status: AC
  Administered 2015-07-08: 5 mg via ORAL
  Filled 2015-07-08: qty 1

## 2015-07-08 MED ORDER — METOCLOPRAMIDE HCL 5 MG/ML IJ SOLN
10.0000 mg | Freq: Once | INTRAMUSCULAR | Status: AC
  Administered 2015-07-08: 10 mg via INTRAVENOUS
  Filled 2015-07-08: qty 2

## 2015-07-08 MED ORDER — DIPHENHYDRAMINE HCL 50 MG/ML IJ SOLN
25.0000 mg | Freq: Once | INTRAMUSCULAR | Status: AC
  Administered 2015-07-08: 25 mg via INTRAVENOUS
  Filled 2015-07-08: qty 1

## 2015-07-08 MED ORDER — KETOROLAC TROMETHAMINE 15 MG/ML IJ SOLN
15.0000 mg | Freq: Once | INTRAMUSCULAR | Status: AC
  Administered 2015-07-08: 15 mg via INTRAVENOUS
  Filled 2015-07-08: qty 1

## 2015-07-08 MED ORDER — DEXAMETHASONE SODIUM PHOSPHATE 10 MG/ML IJ SOLN
10.0000 mg | Freq: Once | INTRAMUSCULAR | Status: AC
  Administered 2015-07-08: 10 mg via INTRAVENOUS
  Filled 2015-07-08: qty 1

## 2015-07-08 MED ORDER — HYDROMORPHONE HCL 1 MG/ML IJ SOLN
1.0000 mg | Freq: Once | INTRAMUSCULAR | Status: AC
  Administered 2015-07-08: 1 mg via INTRAVENOUS
  Filled 2015-07-08: qty 1

## 2015-07-08 MED ORDER — SODIUM CHLORIDE 0.9 % IV BOLUS (SEPSIS)
1000.0000 mL | Freq: Once | INTRAVENOUS | Status: AC
  Administered 2015-07-08: 1000 mL via INTRAVENOUS

## 2015-07-08 NOTE — ED Provider Notes (Signed)
CSN: [id number]     Arrival date & time 07/08/15  [id number] History   First MD Initiated Contact with Patient 07/08/15 [phone]     Chief Complaint  Patient presents with  . Migraine     (Consider location/radiation/quality/duration/timing/severity/associated sxs/prior Treatment) Patient is a 30 y.o. female presenting with migraines. The history is provided by the patient.  Migraine This is a recurrent problem. Episode onset: 4 days. The problem occurs constantly. The problem has not changed since onset.Associated symptoms include headaches. Pertinent negatives include no abdominal pain. Associated symptoms comments: No fever, but does have some neck pain.  Headache pain is all on the right side of the face and eye.  No numbness, weakness, speech problems.  No gait disturbance.  Nausea without vomiting.  No med changes or OTC meds.  LMP 2 weeks ago. Exacerbated by: bright light and loud noise. Nothing relieves the symptoms. Treatments tried: aleve. The treatment provided no relief.    Past Medical History  Diagnosis Date  . Gastric reflux   . Anxiety   . Basedow's disease   . Pyelonephritis   . History of nephrolithiasis   . Anxiety disorder   . Bipolar disorder ([place])   . Interstitial cystitis   . Chronic back pain   . Fibroid   . Barrett esophagus   . ADD (attention deficit disorder)   . Back pain    Past Surgical History  Procedure Laterality Date  . Tonsillectomy    . Adenoidectomy    . Tear duct probing    . Cholecystectomy N/A 01/05/2014    Procedure: LAPAROSCOPIC CHOLECYSTECTOMY WITH INTRAOPERATIVE CHOLANGIOGRAM;  Surgeon: Edward Jolly, MD;  Location: [facility];  Service: General;  Laterality: N/A;  . Esophagogastroduodenoscopy (egd) with propofol N/A 06/18/2014    Procedure: ESOPHAGOGASTRODUODENOSCOPY (EGD) WITH PROPOFOL;  Surgeon: Wonda Horner, MD;  Location[facility] ENDOSCOPY;  Service: Endoscopy;  Laterality: N/A;  . Wisdom tooth extraction     Family History  Problem  Relation Age of Onset  . Diabetes    . Hypertension    . Lung cancer    . Bipolar disorder Sister    Social History  Substance Use Topics  . Smoking status: Current Some Day Smoker -- 1.00 packs/day for 8 years    Types: Cigarettes  . Smokeless tobacco: Never Used  . Alcohol Use: No   OB History    Gravida Para Term Preterm AB TAB SAB Ectopic Multiple Living   1 1 1       1      Review of Systems  Gastrointestinal: Negative for abdominal pain.  Neurological: Positive for headaches.  All other systems reviewed and are negative.     Allergies  Review of patient's allergies indicates no known allergies.  Home Medications   Prior to Admission medications   Medication Sig Start Date End Date Taking? Authorizing Provider  amphetamine-dextroamphetamine (ADDERALL) 20 MG tablet Take 1 tablet (20 mg total) by mouth 2 (two) times daily. 06/23/15  Yes Rosalita Chessman, DO  aspirin-acetaminophen-caffeine (EXCEDRIN MIGRAINE) [phone] MG tablet Take by mouth every 6 (six) hours as needed for headache.   Yes Historical Provider, MD  HYDROcodone-acetaminophen (NORCO) 10-325 MG per tablet Take 1 tablet by mouth every 6 (six) hours as needed for moderate pain or severe pain (pain). 10/07/14  Yes Yvonne R Lowne, DO  ibuprofen (ADVIL,MOTRIN) 600 MG tablet Take 1 tablet (600 mg total) by mouth every 8 (eight) hours as needed. 06/19/15  Yes Jola Schmidt, MD  Norgestimate-Ethinyl Estradiol Triphasic (TRI-PREVIFEM) 0.18/0.215/0.25 MG-35 MCG tablet Take 1 tablet by mouth daily.   Yes Historical Provider, MD  cephALEXin (KEFLEX) 500 MG capsule Take 1 capsule (500 mg total) by mouth 3 (three) times daily. 06/19/15   Jola Schmidt, MD  pantoprazole (PROTONIX) 20 MG tablet Take 1 tablet (20 mg total) by mouth daily. 04/15/15   Delsa Grana, PA-C  promethazine (PHENERGAN) 25 MG tablet Take 1 tablet (25 mg total) by mouth every 6 (six) hours as needed for nausea or vomiting. 06/19/15   Jola Schmidt, MD   BP 106/85 mmHg   Pulse 84  Temp(Src) 98.1 F (36.7 C) (Oral)  Resp 20  Ht 5\' 6"  (1.676 m)  Wt 140 lb (63.504 kg)  BMI 22.61 kg/m2  SpO2 100%  LMP 06/29/2015 Physical Exam  Constitutional: She is oriented to person, place, and time. She appears well-developed and well-nourished. No distress.  HENT:  Head: Normocephalic and atraumatic.  Mouth/Throat: Oropharynx is clear and moist. Mucous membranes are dry.  Eyes: Conjunctivae and EOM are normal. Pupils are equal, round, and reactive to light. Right eye exhibits no discharge. Left eye exhibits no discharge.  photophobia  Neck: Normal range of motion. Neck supple. No spinous process tenderness present. No rigidity. No Brudzinski's sign and no Kernig's sign noted.  Cardiovascular: Normal rate, normal heart sounds and intact distal pulses.   No murmur heard. Pulmonary/Chest: Effort normal and breath sounds normal. No respiratory distress. She has no wheezes. She has no rales.  Abdominal: Soft. She exhibits no distension. There is no tenderness.  Musculoskeletal: Normal range of motion. She exhibits no edema or tenderness.  Lymphadenopathy:    She has no cervical adenopathy.  Neurological: She is alert and oriented to person, place, and time. She has normal strength. No cranial nerve deficit or sensory deficit. Coordination and gait normal. GCS eye subscore is 4. GCS verbal subscore is 5. GCS motor subscore is 6.  Skin: Skin is warm and dry.  Psychiatric: She has a normal mood and affect. Her behavior is normal.  Nursing note and vitals reviewed.   ED Course  Procedures (including critical care time) Labs Review Labs Reviewed - No data to display  Imaging Review No results found. I have personally reviewed and evaluated these images and lab results as part of my medical decision-making.   EKG Interpretation None      MDM   Final diagnoses:  Migraine without aura and with status migrainosus, not intractable   Pt with typical migraine HA  without sx suggestive of SAH(sudden onset, worst of life, or deficits), infection, or cavernous vein thrombosis.  Normal neuro exam and vital signs. Will give HA cocktail and re-eval.  10:48 AM On reevaluation patient still complaining of a headache so was given magnesium and Dilaudid.  12:56 PM Improvement in pain but still some neck pain so pt given valium.  She was d/ced home  Blanchie Dessert, MD 07/08/15 1256

## 2015-07-08 NOTE — Discharge Instructions (Signed)

## 2015-07-08 NOTE — ED Notes (Signed)
Pt reports that she has had migraine x  4 days, reports severe pain and pressure to forehead and base of neck

## 2015-07-23 MED FILL — MELOXICAM 7.5 MG TABLET: 7.5 | 30 days supply | Qty: 60 | Fill #0

## 2015-07-23 MED FILL — TRI-PREVIFEM TABLET: 0.18/0.215/ | 28 days supply | Qty: 28 | Fill #6

## 2015-07-23 MED FILL — HYDROCODON-APAP 10-325: 10-325 | 30 days supply | Qty: 60 | Fill #0

## 2015-07-25 ENCOUNTER — Telehealth: Payer: Self-pay | Admitting: Family Medicine

## 2015-07-25 DIAGNOSIS — F909 Attention-deficit hyperactivity disorder, unspecified type: Secondary | ICD-10-CM

## 2015-07-25 NOTE — Telephone Encounter (Signed)
Relation to [id number] Call back number:[phone]   Reason for call:  Pt requesting a refill amphetamine-dextroamphetamine (ADDERALL) 20 MG tablet

## 2015-07-28 NOTE — Telephone Encounter (Signed)
Spoke with patent. UDS on 06/23/15 was negative for Adderall and +for Oxycodone. Patient stated she did not take the Adderall for 2 days and says she is on hydrocodone but she has an old pill bottle of Oxycodone and she said she took 2 because it works better. UDS marked high risk, please advise      KP

## 2015-07-29 MED ORDER — AMPHETAMINE-DEXTROAMPHETAMINE 20 MG PO TABS: 20.0000 mg | ORAL_TABLET | Freq: Two times a day (BID) | ORAL | Status: DC

## 2015-07-29 NOTE — Telephone Encounter (Signed)
Patient aware Rx ready for pick up.      KP 

## 2015-07-29 NOTE — Telephone Encounter (Signed)
Refill x1---  Repeat uds next month

## 2015-07-30 MED FILL — AMPHETAMINE SALTS 20 MG TAB: 20 | 30 days supply | Qty: 60 | Fill #0

## 2015-08-05 ENCOUNTER — Encounter: Payer: Self-pay | Admitting: Family Medicine

## 2015-08-18 MED FILL — MELOXICAM 7.5 MG TABLET: 7.5 | 30 days supply | Qty: 60 | Fill #0

## 2015-08-18 MED FILL — TRI-PREVIFEM TABLET: 0.18/0.215/ | 28 days supply | Qty: 28 | Fill #7

## 2015-08-20 MED FILL — HYDROCODON-APAP 10-325: 10-325 | 30 days supply | Qty: 60 | Fill #0

## 2015-08-26 ENCOUNTER — Encounter (HOSPITAL_BASED_OUTPATIENT_CLINIC_OR_DEPARTMENT_OTHER): Payer: Self-pay | Admitting: *Deleted

## 2015-08-26 ENCOUNTER — Emergency Department (HOSPITAL_BASED_OUTPATIENT_CLINIC_OR_DEPARTMENT_OTHER)
Admission: EM | Admit: 2015-08-26 | Discharge: 2015-08-26 | Disposition: A | Discharge status: Home / Self Care | Payer: Self-pay | Attending: Emergency Medicine | Admitting: Emergency Medicine

## 2015-08-26 DIAGNOSIS — Z3202 Encounter for pregnancy test, result negative: Secondary | ICD-10-CM | POA: Insufficient documentation

## 2015-08-26 DIAGNOSIS — F909 Attention-deficit hyperactivity disorder, unspecified type: Secondary | ICD-10-CM | POA: Insufficient documentation

## 2015-08-26 DIAGNOSIS — Z79899 Other long term (current) drug therapy: Secondary | ICD-10-CM | POA: Insufficient documentation

## 2015-08-26 DIAGNOSIS — Z8639 Personal history of other endocrine, nutritional and metabolic disease: Secondary | ICD-10-CM | POA: Insufficient documentation

## 2015-08-26 DIAGNOSIS — K219 Gastro-esophageal reflux disease without esophagitis: Secondary | ICD-10-CM | POA: Insufficient documentation

## 2015-08-26 DIAGNOSIS — Z793 Long term (current) use of hormonal contraceptives: Secondary | ICD-10-CM | POA: Insufficient documentation

## 2015-08-26 DIAGNOSIS — F17210 Nicotine dependence, cigarettes, uncomplicated: Secondary | ICD-10-CM | POA: Insufficient documentation

## 2015-08-26 DIAGNOSIS — Z87442 Personal history of urinary calculi: Secondary | ICD-10-CM | POA: Insufficient documentation

## 2015-08-26 DIAGNOSIS — G8929 Other chronic pain: Secondary | ICD-10-CM | POA: Insufficient documentation

## 2015-08-26 DIAGNOSIS — Z792 Long term (current) use of antibiotics: Secondary | ICD-10-CM | POA: Insufficient documentation

## 2015-08-26 DIAGNOSIS — G43909 Migraine, unspecified, not intractable, without status migrainosus: Secondary | ICD-10-CM | POA: Insufficient documentation

## 2015-08-26 DIAGNOSIS — Z86018 Personal history of other benign neoplasm: Secondary | ICD-10-CM | POA: Insufficient documentation

## 2015-08-26 DIAGNOSIS — N390 Urinary tract infection, site not specified: Secondary | ICD-10-CM | POA: Insufficient documentation

## 2015-08-26 LAB — COMPREHENSIVE METABOLIC PANEL
ALBUMIN: 4.1 g/dL (ref 3.5–5.0)
ALT: 11 U/L — AB (ref 14–54)
AST: 16 U/L (ref 15–41)
Alkaline Phosphatase: 37 U/L — ABNORMAL LOW (ref 38–126)
Anion gap: 9 (ref 5–15)
BILIRUBIN TOTAL: 0.6 mg/dL (ref 0.3–1.2)
BUN: 9 mg/dL (ref 6–20)
CALCIUM: 8.8 mg/dL — AB (ref 8.9–10.3)
CHLORIDE: 105 mmol/L (ref 101–111)
CO2: 23 mmol/L (ref 22–32)
CREATININE: 0.58 mg/dL (ref 0.44–1.00)
GFR calc Af Amer: >60 mL/min (ref >60)
Glucose, Bld: 98 mg/dL (ref 65–99)
Potassium: 3.9 mmol/L (ref 3.5–5.1)
Sodium: 137 mmol/L (ref 135–145)
Total Protein: 6.9 g/dL (ref 6.5–8.1)

## 2015-08-26 LAB — URINALYSIS, ROUTINE W REFLEX MICROSCOPIC
Bilirubin Urine: NEGATIVE
Glucose, UA: NEGATIVE mg/dL
Ketones, ur: NEGATIVE mg/dL
Nitrite: NEGATIVE
Protein, ur: NEGATIVE mg/dL
SPECIFIC GRAVITY, URINE: 1.02 (ref 1.005–1.030)
pH: 6 (ref 5.0–8.0)

## 2015-08-26 LAB — CBC WITH DIFFERENTIAL/PLATELET
BASOS ABS: 0 10*3/uL (ref 0.0–0.1)
Basophils Relative: 0 %
Eosinophils Absolute: 0.3 10*3/uL (ref 0.0–0.7)
Eosinophils Relative: 2 %
HEMATOCRIT: 40.1 % (ref 36.0–46.0)
HEMOGLOBIN: 13 g/dL (ref 12.0–15.0)
LYMPHS PCT: 11 %
Lymphs Abs: 1.6 10*3/uL (ref 0.7–4.0)
MCH: 28.2 pg (ref 26.0–34.0)
MCHC: 32.4 g/dL (ref 30.0–36.0)
MCV: 87 fL (ref 78.0–100.0)
Monocytes Absolute: 0.8 10*3/uL (ref 0.1–1.0)
Monocytes Relative: 6 %
NEUTROS ABS: 11.8 10*3/uL — AB (ref 1.7–7.7)
NEUTROS PCT: 81 %
Platelets: 216 10*3/uL (ref 150–400)
RBC: 4.61 MIL/uL (ref 3.87–5.11)
RDW: 14.6 % (ref 11.5–15.5)
WBC: 14.6 10*3/uL — AB (ref 4.0–10.5)

## 2015-08-26 LAB — URINE MICROSCOPIC-ADD ON

## 2015-08-26 LAB — PREGNANCY, URINE: PREG TEST UR: NEGATIVE

## 2015-08-26 MED ORDER — KETOROLAC TROMETHAMINE 30 MG/ML IJ SOLN
30.0000 mg | Freq: Once | INTRAMUSCULAR | Status: AC
  Administered 2015-08-26: 30 mg via INTRAVENOUS
  Filled 2015-08-26: qty 1

## 2015-08-26 MED ORDER — ONDANSETRON HCL 4 MG PO TABS: 4.0000 mg | ORAL_TABLET | Freq: Four times a day (QID) | ORAL | Status: DC

## 2015-08-26 MED ORDER — METOCLOPRAMIDE HCL 5 MG/ML IJ SOLN
10.0000 mg | Freq: Once | INTRAMUSCULAR | Status: AC
  Administered 2015-08-26: 10 mg via INTRAVENOUS
  Filled 2015-08-26: qty 2

## 2015-08-26 MED ORDER — DEXAMETHASONE SODIUM PHOSPHATE 10 MG/ML IJ SOLN
10.0000 mg | Freq: Once | INTRAMUSCULAR | Status: AC
  Administered 2015-08-26: 10 mg via INTRAVENOUS
  Filled 2015-08-26: qty 1

## 2015-08-26 MED ORDER — DIPHENHYDRAMINE HCL 50 MG/ML IJ SOLN
25.0000 mg | Freq: Once | INTRAMUSCULAR | Status: AC
  Administered 2015-08-26: 25 mg via INTRAVENOUS
  Filled 2015-08-26: qty 1

## 2015-08-26 MED ORDER — HYDROMORPHONE HCL 1 MG/ML IJ SOLN
1.0000 mg | Freq: Once | INTRAMUSCULAR | Status: AC
  Administered 2015-08-26: 1 mg via INTRAVENOUS
  Filled 2015-08-26: qty 1

## 2015-08-26 MED ORDER — NITROFURANTOIN MONOHYD MACRO 100 MG PO CAPS: 100.0000 mg | ORAL_CAPSULE | Freq: Two times a day (BID) | ORAL | Status: DC

## 2015-08-26 NOTE — ED Notes (Signed)
Woke with a headache and abdominal pain. Diarrhea. Nausea.

## 2015-08-26 NOTE — ED Provider Notes (Signed)
CSN: [id number]     Arrival date & time 08/26/15  1204 History   First MD Initiated Contact with Patient 08/26/15 1221     Chief Complaint  Patient presents with  . Abdominal Pain     (Consider location/radiation/quality/duration/timing/severity/associated sxs/prior Treatment) HPI Comments: Patient presents today with two separate complaints.  She is complaining of both a headache and abdominal pain.  She states that the headache is frontal and has been constant since this morning.  Pain gradual in onset and is gradually worsening.  Headache associated with photophobia and phonophobia.  She also reports associated nausea, but denies vomiting.  She denies any head injury or trauma.  She has taken OTC pain medications without relief.  She reports that her headache is similar to Migraine Headaches that she has had in the past. She denies fever, chills, vision changes, focal weakness, numbness, tingling, dizziness, or neck pain/stiffness.    Patient is also complaining of suprapubic abdominal pain  Pain has been present since this morning.  Pain similar to the pain that she has had with Interstitial Cystitis in the past.  She denies urinary symptoms at this time.  Denies flank pain.  She denies fever, chills, vaginal bleeding, vaginal discharge, or constipation.  She reports one episode of loose stool this morning.  She reports nausea, but no vomiting.  She has not taken anything for the pain prior to arrival.  The history is provided by the patient.    Past Medical History  Diagnosis Date  . Gastric reflux   . Anxiety   . Basedow's disease   . Pyelonephritis   . History of nephrolithiasis   . Anxiety disorder   . Bipolar disorder ([place])   . Interstitial cystitis   . Chronic back pain   . Fibroid   . Barrett esophagus   . ADD (attention deficit disorder)   . Back pain    Past Surgical History  Procedure Laterality Date  . Tonsillectomy    . Adenoidectomy    . Tear duct probing    .  Cholecystectomy N/A 01/05/2014    Procedure: LAPAROSCOPIC CHOLECYSTECTOMY WITH INTRAOPERATIVE CHOLANGIOGRAM;  Surgeon: Edward Jolly, MD;  Location: [facility];  Service: General;  Laterality: N/A;  . Esophagogastroduodenoscopy (egd) with propofol N/A 06/18/2014    Procedure: ESOPHAGOGASTRODUODENOSCOPY (EGD) WITH PROPOFOL;  Surgeon: Wonda Horner, MD;  Location: [facility] ENDOSCOPY;  Service: Endoscopy;  Laterality: N/A;  . Wisdom tooth extraction     Family History  Problem Relation Age of Onset  . Diabetes    . Hypertension    . Lung cancer    . Bipolar disorder Sister    Social History  Substance Use Topics  . Smoking status: Current Some Day Smoker -- 1.00 packs/day for 8 years    Types: Cigarettes  . Smokeless tobacco: Never Used  . Alcohol Use: No   OB History    Gravida Para Term Preterm AB TAB SAB Ectopic Multiple Living   1 1 1       1      Review of Systems  All other systems reviewed and are negative.     Allergies  Review of patient's allergies indicates no known allergies.  Home Medications   Prior to Admission medications   Medication Sig Start Date End Date Taking? Authorizing Provider  amphetamine-dextroamphetamine (ADDERALL) 20 MG tablet Take 1 tablet (20 mg total) by mouth 2 (two) times daily. 07/29/15   Rosalita Chessman, DO  aspirin-acetaminophen-caffeine ([place]) [phone]  MG tablet Take by mouth every 6 (six) hours as needed for headache.    Historical Provider, MD  cephALEXin (KEFLEX) 500 MG capsule Take 1 capsule (500 mg total) by mouth 3 (three) times daily. 06/19/15   Jola Schmidt, MD  HYDROcodone-acetaminophen [facility]) 10-325 MG per tablet Take 1 tablet by mouth every 6 (six) hours as needed for moderate pain or severe pain (pain). 10/07/14   Rosalita Chessman, DO  ibuprofen (ADVIL,MOTRIN) 600 MG tablet Take 1 tablet (600 mg total) by mouth every 8 (eight) hours as needed. 06/19/15   Jola Schmidt, MD  Norgestimate-Ethinyl Estradiol Triphasic (TRI-PREVIFEM)  0.18/0.215/0.25 MG-35 MCG tablet Take 1 tablet by mouth daily.    Historical Provider, MD  pantoprazole (PROTONIX) 20 MG tablet Take 1 tablet (20 mg total) by mouth daily. 04/15/15   Delsa Grana, PA-C  promethazine (PHENERGAN) 25 MG tablet Take 1 tablet (25 mg total) by mouth every 6 (six) hours as needed for nausea or vomiting. 06/19/15   Jola Schmidt, MD   BP 110/79 mmHg  Pulse 85  Temp(Src) 98 F (36.7 C) (Oral)  Resp 20  Ht 5\' 6"  (1.676 m)  Wt 63.504 kg  BMI 22.61 kg/m2  SpO2 100%  LMP 08/19/2015 Physical Exam  Constitutional: She is oriented to person, place, and time. She appears well-developed and well-nourished.  HENT:  Head: Normocephalic and atraumatic.  Mouth/Throat: Oropharynx is clear and moist.  Eyes: EOM are normal. Pupils are equal, round, and reactive to light.  Neck: Normal range of motion. Neck supple.  No nuchal rigidity  Cardiovascular: Normal rate, regular rhythm and normal heart sounds.   Pulmonary/Chest: Effort normal and breath sounds normal.  Abdominal: Soft. Bowel sounds are normal. She exhibits no distension and no mass. There is tenderness. There is no rebound, no guarding and no CVA tenderness.  Mild suprapubic abdominal tenderness to palpation   Musculoskeletal: Normal range of motion.  Neurological: She is alert and oriented to person, place, and time. She has normal strength. No cranial nerve deficit or sensory deficit. Coordination and gait normal.  Normal finger to nose testing Normal gait, no ataxia   Skin: Skin is warm and dry.  Psychiatric: She has a normal mood and affect.  Nursing note and vitals reviewed.   ED Course  Procedures (including critical care time) Labs Review Labs Reviewed  URINALYSIS, ROUTINE W REFLEX MICROSCOPIC (NOT AT [facility]) - Abnormal; Notable for the following:    APPearance CLOUDY (*)    Hgb urine dipstick MODERATE (*)    Leukocytes, UA SMALL (*)    All other components within normal limits  CBC WITH  DIFFERENTIAL/PLATELET - Abnormal; Notable for the following:    WBC 14.6 (*)    Neutro Abs 11.8 (*)    All other components within normal limits  COMPREHENSIVE METABOLIC PANEL - Abnormal; Notable for the following:    Calcium 8.8 (*)    ALT 11 (*)    Alkaline Phosphatase 37 (*)    All other components within normal limits  URINE MICROSCOPIC-ADD ON - Abnormal; Notable for the following:    Squamous Epithelial / LPF 0-5 (*)    Bacteria, UA MANY (*)    All other components within normal limits  URINE CULTURE  PREGNANCY, URINE    Imaging Review No results found. I have personally reviewed and evaluated these images and lab results as part of my medical decision-making.   EKG Interpretation None     4:11 PM Reassessed patient.  She reports  improvement in her pain at this time. MDM   Final diagnoses:  None   Patient presents today with two separate complaints.  She is complaining of a headache and also suprapubic abdominal pain.   She has a history of Migraines and reports that headache is similar to previous headaches.  Pt HA treated and improved while in ED.  Presentation is like pts typical HA and non concerning for [facility], ICH, Meningitis, or temporal arteritis. Pt is afebrile with no focal neuro deficits, nuchal rigidity, or change in vision. Pt is to follow up with PCP to discuss prophylactic medication. Pt verbalizes understanding and is agreeable with plan to dc.    Patient with a history of interstitial cystitis and is complaining of suprapubic abdominal pain.  No rebound or guarding on exam.  Labs unremarkable aside from mild leukocytosis.  UA showing possible UTI.  Urine cultured.  Patient states that she has been on Macrobid before for treatment of UTI's.  Therefore, patient started on Macrobid.  No fever or flank pain to suggest Pyelonephritis.  Patient stable for discharge.  Return precautions given.     Hyman Bible, PA-C 08/28/15 1338  Veryl Speak, MD 08/28/15  [phone]

## 2015-08-27 ENCOUNTER — Telehealth: Payer: Self-pay | Admitting: Family Medicine

## 2015-08-27 DIAGNOSIS — F909 Attention-deficit hyperactivity disorder, unspecified type: Secondary | ICD-10-CM

## 2015-08-27 NOTE — Telephone Encounter (Signed)
Relationship to patient: Self   Can be reached: [phone]  Reason for call: pt is requesting a refill on her Adderall medication.

## 2015-08-28 LAB — URINE CULTURE

## 2015-08-28 MED ORDER — AMPHETAMINE-DEXTROAMPHETAMINE 20 MG PO TABS: 20.0000 mg | ORAL_TABLET | Freq: Two times a day (BID) | ORAL | Status: DC

## 2015-08-28 NOTE — Telephone Encounter (Signed)
Last seen 03/20/15 and filled 07/29/15 #30 Repeat UDS due now. Previous was + for Oxycodone   Please advise     KP

## 2015-08-28 NOTE — Telephone Encounter (Signed)
Refill x1--- need uds 

## 2015-08-28 NOTE — Telephone Encounter (Signed)
Patient aware Rx will be ready for pick up tomorrow.      KP 

## 2015-08-29 MED FILL — AMPHETAMINE SALTS 20 MG TAB: 20 | 30 days supply | Qty: 60 | Fill #0

## 2015-09-03 ENCOUNTER — Emergency Department (HOSPITAL_COMMUNITY): Payer: Self-pay

## 2015-09-03 ENCOUNTER — Encounter (HOSPITAL_COMMUNITY): Payer: Self-pay | Admitting: Emergency Medicine

## 2015-09-03 ENCOUNTER — Emergency Department (HOSPITAL_COMMUNITY)
Admission: EM | Admit: 2015-09-03 | Discharge: 2015-09-03 | Disposition: A | Discharge status: Home / Self Care | Payer: Self-pay | Attending: Emergency Medicine | Admitting: Emergency Medicine

## 2015-09-03 DIAGNOSIS — K219 Gastro-esophageal reflux disease without esophagitis: Secondary | ICD-10-CM | POA: Insufficient documentation

## 2015-09-03 DIAGNOSIS — Z8639 Personal history of other endocrine, nutritional and metabolic disease: Secondary | ICD-10-CM | POA: Insufficient documentation

## 2015-09-03 DIAGNOSIS — Z79899 Other long term (current) drug therapy: Secondary | ICD-10-CM | POA: Insufficient documentation

## 2015-09-03 DIAGNOSIS — G8929 Other chronic pain: Secondary | ICD-10-CM | POA: Insufficient documentation

## 2015-09-03 DIAGNOSIS — R1013 Epigastric pain: Secondary | ICD-10-CM

## 2015-09-03 DIAGNOSIS — K2270 Barrett's esophagus without dysplasia: Secondary | ICD-10-CM | POA: Insufficient documentation

## 2015-09-03 DIAGNOSIS — F909 Attention-deficit hyperactivity disorder, unspecified type: Secondary | ICD-10-CM | POA: Insufficient documentation

## 2015-09-03 DIAGNOSIS — Z9049 Acquired absence of other specified parts of digestive tract: Secondary | ICD-10-CM | POA: Insufficient documentation

## 2015-09-03 DIAGNOSIS — F17210 Nicotine dependence, cigarettes, uncomplicated: Secondary | ICD-10-CM | POA: Insufficient documentation

## 2015-09-03 DIAGNOSIS — Z3202 Encounter for pregnancy test, result negative: Secondary | ICD-10-CM | POA: Insufficient documentation

## 2015-09-03 DIAGNOSIS — R1031 Right lower quadrant pain: Secondary | ICD-10-CM

## 2015-09-03 DIAGNOSIS — Z87442 Personal history of urinary calculi: Secondary | ICD-10-CM | POA: Insufficient documentation

## 2015-09-03 DIAGNOSIS — R110 Nausea: Secondary | ICD-10-CM | POA: Insufficient documentation

## 2015-09-03 DIAGNOSIS — Z87448 Personal history of other diseases of urinary system: Secondary | ICD-10-CM | POA: Insufficient documentation

## 2015-09-03 DIAGNOSIS — Z86018 Personal history of other benign neoplasm: Secondary | ICD-10-CM | POA: Insufficient documentation

## 2015-09-03 DIAGNOSIS — R1084 Generalized abdominal pain: Secondary | ICD-10-CM | POA: Insufficient documentation

## 2015-09-03 DIAGNOSIS — N898 Other specified noninflammatory disorders of vagina: Secondary | ICD-10-CM | POA: Insufficient documentation

## 2015-09-03 LAB — CBC
HCT: 39.9 % (ref 36.0–46.0)
HEMOGLOBIN: 12.7 g/dL (ref 12.0–15.0)
MCH: 28 pg (ref 26.0–34.0)
MCHC: 31.8 g/dL (ref 30.0–36.0)
MCV: 88.1 fL (ref 78.0–100.0)
PLATELETS: 240 10*3/uL (ref 150–400)
RBC: 4.53 MIL/uL (ref 3.87–5.11)
RDW: 13.7 % (ref 11.5–15.5)
WBC: 13.3 10*3/uL — ABNORMAL HIGH (ref 4.0–10.5)

## 2015-09-03 LAB — COMPREHENSIVE METABOLIC PANEL
ALT: 18 U/L (ref 14–54)
AST: 16 U/L (ref 15–41)
Albumin: 4.3 g/dL (ref 3.5–5.0)
Alkaline Phosphatase: 33 U/L — ABNORMAL LOW (ref 38–126)
Anion gap: 7 (ref 5–15)
BUN: 7 mg/dL (ref 6–20)
CHLORIDE: 104 mmol/L (ref 101–111)
CO2: 27 mmol/L (ref 22–32)
CREATININE: 0.74 mg/dL (ref 0.44–1.00)
Calcium: 8.9 mg/dL (ref 8.9–10.3)
GFR calc non Af Amer: >60 mL/min (ref >60)
Glucose, Bld: 90 mg/dL (ref 65–99)
Potassium: 3.7 mmol/L (ref 3.5–5.1)
SODIUM: 138 mmol/L (ref 135–145)
Total Bilirubin: 0.5 mg/dL (ref 0.3–1.2)
Total Protein: 7.1 g/dL (ref 6.5–8.1)

## 2015-09-03 LAB — URINALYSIS, ROUTINE W REFLEX MICROSCOPIC
Bilirubin Urine: NEGATIVE
GLUCOSE, UA: NEGATIVE mg/dL
Ketones, ur: NEGATIVE mg/dL
Nitrite: NEGATIVE
PH: 7 (ref 5.0–8.0)
Protein, ur: NEGATIVE mg/dL
SPECIFIC GRAVITY, URINE: 1.01 (ref 1.005–1.030)

## 2015-09-03 LAB — URINE MICROSCOPIC-ADD ON

## 2015-09-03 LAB — HCG, QUANTITATIVE, PREGNANCY

## 2015-09-03 LAB — WET PREP, GENITAL
CLUE CELLS WET PREP: NONE SEEN
Sperm: NONE SEEN
TRICH WET PREP: NONE SEEN
Yeast Wet Prep HPF POC: NONE SEEN

## 2015-09-03 LAB — LIPASE, BLOOD: LIPASE: 26 U/L (ref 11–51)

## 2015-09-03 MED ORDER — IOHEXOL 300 MG/ML  SOLN
25.0000 mL | Freq: Once | INTRAMUSCULAR | Status: AC | PRN
  Administered 2015-09-03: 25 mL via ORAL

## 2015-09-03 MED ORDER — PANTOPRAZOLE SODIUM 20 MG PO TBEC: 20.0000 mg | DELAYED_RELEASE_TABLET | Freq: Every day | ORAL | Status: DC

## 2015-09-03 MED ORDER — IOPAMIDOL (ISOVUE-300) INJECTION 61%
100.0000 mL | Freq: Once | INTRAVENOUS | Status: AC | PRN
  Administered 2015-09-03: 100 mL via INTRAVENOUS

## 2015-09-03 MED ORDER — HYDROMORPHONE HCL 1 MG/ML IJ SOLN
0.5000 mg | Freq: Once | INTRAMUSCULAR | Status: AC
  Administered 2015-09-03: 0.5 mg via INTRAVENOUS
  Filled 2015-09-03: qty 1

## 2015-09-03 MED ORDER — GI COCKTAIL ~~LOC~~
30.0000 mL | Freq: Once | ORAL | Status: AC
  Administered 2015-09-03: 30 mL via ORAL
  Filled 2015-09-03: qty 30

## 2015-09-03 MED ORDER — ONDANSETRON 4 MG PO TBDP: 4.0000 mg | ORAL_TABLET | Freq: Three times a day (TID) | ORAL | Status: DC | PRN

## 2015-09-03 MED ORDER — ONDANSETRON HCL 4 MG/2ML IJ SOLN
4.0000 mg | INTRAMUSCULAR | Status: AC
  Administered 2015-09-03: 4 mg via INTRAVENOUS
  Filled 2015-09-03: qty 2

## 2015-09-03 MED ORDER — PROMETHAZINE HCL 25 MG/ML IJ SOLN
25.0000 mg | Freq: Once | INTRAMUSCULAR | Status: AC
  Administered 2015-09-03: 25 mg via INTRAVENOUS
  Filled 2015-09-03: qty 1

## 2015-09-03 NOTE — Discharge Instructions (Signed)
1. Medications: protonix, usual home medications 2. Treatment: rest, drink plenty of fluids  3. Follow Up: please followup with Dr. Michail Sermon and with your PCP for discussion of your diagnoses and further evaluation after today's visit; if you do not have a primary care doctor use the phone number listed in your discharge paperwork to find one; please return to the ER for high fever, severe pain, persistent vomiting, new or worsening symptoms   Abdominal Pain, Adult Many things can cause abdominal pain. Usually, abdominal pain is not caused by a disease and will improve without treatment. It can often be observed and treated at home. Your health care provider will do a physical exam and possibly order blood tests and X-rays to help determine the seriousness of your pain. However, in many cases, more time must pass before a clear cause of the pain can be found. Before that point, your health care provider may not know if you need more testing or further treatment. HOME CARE INSTRUCTIONS Monitor your abdominal pain for any changes. The following actions may help to alleviate any discomfort you are experiencing:  Only take over-the-counter or prescription medicines as directed by your health care provider.  Do not take laxatives unless directed to do so by your health care provider.  Try a clear liquid diet (broth, tea, or water) as directed by your health care provider. Slowly move to a bland diet as tolerated. SEEK MEDICAL CARE IF:  You have unexplained abdominal pain.  You have abdominal pain associated with nausea or diarrhea.  You have pain when you urinate or have a bowel movement.  You experience abdominal pain that wakes you in the night.  You have abdominal pain that is worsened or improved by eating food.  You have abdominal pain that is worsened with eating fatty foods.  You have a fever. SEEK IMMEDIATE MEDICAL CARE IF:  Your pain does not go away within 2 hours.  You keep  throwing up (vomiting).  Your pain is felt only in portions of the abdomen, such as the right side or the left lower portion of the abdomen.  You pass bloody or black tarry stools. MAKE SURE YOU:  Understand these instructions.  Will watch your condition.  Will get help right away if you are not doing well or get worse.   This information is not intended to replace advice given to you by your health care provider. Make sure you discuss any questions you have with your health care provider.   Document Released: 03/10/2005 Document Revised: 02/19/2015 Document Reviewed: 02/07/2013 Elsevier Interactive Patient Education [employer].

## 2015-09-03 NOTE — Progress Notes (Signed)
[facility] consulted to speak to patient regarding GI follow up and orange card.  Patient listed as not having insurance or a pcp.  EDCM spoke to patient at bedside.  Patient is agreeable to be seen at the [facility] for follow up.  Patient is aware GI follow up will be an out of pocket expense.  Patient has an appointment at the [facility] on April 3rd at [place] instructed patient to speak to someone at the desk after her appointment to schedule an appointment with the financial counselor at the [facility].  Patient verbalized understanding.  EDCM provide patient with coupon for protonix from [email or web address].  Also informed patient she may get prescription filled at the [facility].  No further EDCm needs at this time.

## 2015-09-03 NOTE — ED Notes (Signed)
Per EMS-states right lower abdominal pain that started this am

## 2015-09-03 NOTE — ED Provider Notes (Signed)
CSN: [id number]     Arrival date & time 09/03/15  1230 History   First MD Initiated Contact with Patient 09/03/15 1546     Chief Complaint  Patient presents with  . Abdominal Pain    HPI   [name] is a 30 y.o. female with a PMH of GERD, interstitial cystitis, pyelonephritis, nephrolithiasis, bipolar disorder who presents to the ED with lower abdominal pain radiating to her epigastric region, which she notes has been ongoing for several months however worsened this morning. She states her pain is constant and feels sharp. She denies exacerbating or alleviating factors. She notes associated nausea. She denies fever, chills, vomiting, diarrhea, constipation, hematochezia, melena, dysuria, urgency, frequency, vaginal discharge.   Past Medical History  Diagnosis Date  . Gastric reflux   . Anxiety   . Basedow's disease   . Pyelonephritis   . History of nephrolithiasis   . Anxiety disorder   . Bipolar disorder ([place])   . Interstitial cystitis   . Chronic back pain   . Fibroid   . Barrett esophagus   . ADD (attention deficit disorder)   . Back pain    Past Surgical History  Procedure Laterality Date  . Tonsillectomy    . Adenoidectomy    . Tear duct probing    . Cholecystectomy N/A 01/05/2014    Procedure: LAPAROSCOPIC CHOLECYSTECTOMY WITH INTRAOPERATIVE CHOLANGIOGRAM;  Surgeon: Edward Jolly, MD;  Location: [facility] ORS;  Service: General;  Laterality: N/A;  . Esophagogastroduodenoscopy (egd) with propofol N/A 06/18/2014    Procedure: ESOPHAGOGASTRODUODENOSCOPY (EGD) WITH PROPOFOL;  Surgeon: Wonda Horner, MD;  Location: [facility] ENDOSCOPY;  Service: Endoscopy;  Laterality: N/A;  . Wisdom tooth extraction     Family History  Problem Relation Age of Onset  . Diabetes    . Hypertension    . Lung cancer    . Bipolar disorder Sister    Social History  Substance Use Topics  . Smoking status: Current Some Day Smoker -- 1.00 packs/day for 8 years    Types: Cigarettes  . Smokeless  tobacco: Never Used  . Alcohol Use: No   OB History    Gravida Para Term Preterm AB TAB SAB Ectopic Multiple Living   1 1 1       1       Review of Systems  Constitutional: Negative for fever and chills.  Gastrointestinal: Positive for nausea and abdominal pain. Negative for vomiting, diarrhea and constipation.  Genitourinary: Negative for dysuria, urgency, frequency, vaginal bleeding and vaginal discharge.  All other systems reviewed and are negative.     Allergies  Review of patient's allergies indicates no known allergies.  Home Medications   Prior to Admission medications   Medication Sig Start Date End Date Taking? Authorizing Provider  amphetamine-dextroamphetamine (ADDERALL) 20 MG tablet Take 1 tablet (20 mg total) by mouth 2 (two) times daily. 08/28/15  Yes Rosalita Chessman, DO  aspirin-acetaminophen-caffeine (EXCEDRIN MIGRAINE) [id number] MG tablet Take by mouth every 6 (six) hours as needed for headache.   Yes Historical Provider, MD  HYDROcodone-acetaminophen ([place]) 10-325 MG per tablet Take 1 tablet by mouth every 6 (six) hours as needed for moderate pain or severe pain (pain). 10/07/14  Yes Rosalita Chessman, DO  Norgestimate-Ethinyl Estradiol Triphasic (TRI-PREVIFEM) 0.18/0.215/0.25 MG-35 MCG tablet Take 1 tablet by mouth daily.   Yes Historical Provider, MD  ondansetron (ZOFRAN) 4 MG tablet Take 1 tablet (4 mg total) by mouth every 6 (six) hours. 08/26/15  Yes Heather  Laisure, PA-C  cephALEXin (KEFLEX) 500 MG capsule Take 1 capsule (500 mg total) by mouth 3 (three) times daily. Patient not taking: Reported on 09/03/2015 06/19/15   Jola Schmidt, MD  ibuprofen (ADVIL,MOTRIN) 600 MG tablet Take 1 tablet (600 mg total) by mouth every 8 (eight) hours as needed. Patient not taking: Reported on 09/03/2015 06/19/15   Jola Schmidt, MD  nitrofurantoin, macrocrystal-monohydrate, (MACROBID) 100 MG capsule Take 1 capsule (100 mg total) by mouth 2 (two) times daily. Patient not taking: Reported  on 09/03/2015 08/26/15   Hyman Bible, PA-C  ondansetron (ZOFRAN ODT) 4 MG disintegrating tablet Take 1 tablet (4 mg total) by mouth every 8 (eight) hours as needed for nausea. 09/03/15   Marella Chimes, PA-C  pantoprazole (PROTONIX) 20 MG tablet Take 1 tablet (20 mg total) by mouth daily. 09/03/15   Marella Chimes, PA-C  promethazine (PHENERGAN) 25 MG tablet Take 1 tablet (25 mg total) by mouth every 6 (six) hours as needed for nausea or vomiting. Patient not taking: Reported on 09/03/2015 06/19/15   Jola Schmidt, MD    BP 133/98 mmHg  Pulse 80  Temp(Src) 98.6 F (37 C) (Oral)  Resp 16  SpO2 100%  LMP 08/19/2015 Physical Exam  Constitutional: She is oriented to person, place, and time. She appears well-developed and well-nourished. No distress.  HENT:  Head: Normocephalic and atraumatic.  Right Ear: External ear normal.  Left Ear: External ear normal.  Nose: Nose normal.  Mouth/Throat: Uvula is midline, oropharynx is clear and moist and mucous membranes are normal.  Eyes: Conjunctivae, EOM and lids are normal. Pupils are equal, round, and reactive to light. Right eye exhibits no discharge. Left eye exhibits no discharge. No scleral icterus.  Neck: Normal range of motion. Neck supple.  Cardiovascular: Normal rate, regular rhythm, normal heart sounds, intact distal pulses and normal pulses.   Pulmonary/Chest: Effort normal and breath sounds normal. No respiratory distress. She has no wheezes. She has no rales.  Abdominal: Soft. Normal appearance and bowel sounds are normal. She exhibits no distension and no mass. There is tenderness. There is no rigidity, no rebound and no guarding.  Diffuse TTP, worse in RLQ and suprapubic region. No rebound, guarding, or masses.  Genitourinary: Uterus is not enlarged and not tender. Cervix exhibits no motion tenderness, no discharge and no friability. Right adnexum displays no mass, no tenderness and no fullness. Left adnexum displays no mass, no  tenderness and no fullness. No erythema, tenderness or bleeding in the vagina. No foreign body around the vagina. No signs of injury around the vagina. Vaginal discharge found.  Small amount of white discharge present in vaginal vault.  Musculoskeletal: Normal range of motion. She exhibits no edema or tenderness.  Neurological: She is alert and oriented to person, place, and time. She has normal strength. No sensory deficit.  Skin: Skin is warm, dry and intact. No rash noted. She is not diaphoretic. No erythema. No pallor.  Psychiatric: She has a normal mood and affect. Her speech is normal and behavior is normal.  Nursing note and vitals reviewed.   ED Course  Procedures (including critical care time)  Labs Review Labs Reviewed  WET PREP, GENITAL - Abnormal; Notable for the following:    WBC, Wet Prep HPF POC MANY (*)    All other components within normal limits  COMPREHENSIVE METABOLIC PANEL - Abnormal; Notable for the following:    Alkaline Phosphatase 33 (*)    All other components within normal limits  CBC - Abnormal; Notable for the following:    WBC 13.3 (*)    All other components within normal limits  URINALYSIS, ROUTINE W REFLEX MICROSCOPIC (NOT AT [facility]) - Abnormal; Notable for the following:    APPearance CLOUDY (*)    Hgb urine dipstick TRACE (*)    Leukocytes, UA SMALL (*)    All other components within normal limits  URINE MICROSCOPIC-ADD ON - Abnormal; Notable for the following:    Squamous Epithelial / LPF 6-30 (*)    Bacteria, UA RARE (*)    All other components within normal limits  URINE CULTURE  LIPASE, BLOOD  HCG, QUANTITATIVE, PREGNANCY  GC/CHLAMYDIA PROBE AMP ([place]) NOT AT [facility]    Imaging Review Ct Abdomen Pelvis W Contrast  09/03/2015  CLINICAL DATA:  Right lower abdominal pain EXAM: CT ABDOMEN AND PELVIS WITH CONTRAST TECHNIQUE: Multidetector CT imaging of the abdomen and pelvis was performed using the standard protocol following bolus  administration of intravenous contrast. CONTRAST:  62mL OMNIPAQUE IOHEXOL 300 MG/ML SOLN, 151mL ISOVUE-300 IOPAMIDOL (ISOVUE-300) INJECTION 61% COMPARISON:  04/15/2015 FINDINGS: There is marked wall thickening of the wall of the gastroesophageal junction. The wall is low-density and up to 2.1 cm. Postcholecystectomy. Mild diffuse hepatic steatosis. New 1.6 cm avidly enhancing lesion in the right lobe of the liver on image 30. Pancreas, spleen, adrenal glands, and kidneys are within normal limits. Appendix is within normal limits. There is moderate stool burden throughout the colon. No focal lesion involving the colon. There is mild distention of small bowel with air-fluid levels but no obvious transition point. This is most consistent with a mild ileus pattern. Bladder is distended. 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 Uterus is otherwise within normal limits. Ovaries are within normal limits. No vertebral compression deformity. IMPRESSION: Appendix is within normal limits There is new significant wall thickening at the gastroesophageal junction. Inflammatory or neoplastic etiology are not excluded. Esophagram or endoscopy may be helpful. New liver lesion.  Neoplasm is not excluded.  MRI is recommended Mild ileus pattern involving small bowel. Electronically Signed   By: Marybelle Killings M.D.   On: 09/03/2015 18:35   I have personally reviewed and evaluated these images and lab results as part of my medical decision-making.   EKG Interpretation None      MDM   Final diagnoses:  RLQ abdominal pain  Epigastric abdominal pain    30 year old female presents with abdominal pain, which worsened this morning. Notes associated nausea. Patient is afebrile. Vital signs stable. Abdomen soft, non-distended, with diffuse TTP, worse in RLQ and suprapubic region. No rebound, guarding, or masses.  CBC remarkable for leukocytosis of 13.3. CMP unremarkable. Lipase within normal limits. UA negative for  infection. Will obtain hcg and do pelvic exam. Will give pain medicine and antiemetic.  HCG negative. On pelvic exam, patient has a small amount of white discharge present in her vaginal vault. No CMT or cervical friability. No adnexal or uterine tenderness. Wet prep with many WBC. Do not feel treatment is indicated at this time given patient has no complaint of vaginal discharge and no significant discharge on exam.   Patient reports persistent RLQ pain. Will re-dose pain medicine and obtain CT abdomen pelvis.  CT abdomen pelvis remarkable for significant wall thickening at the gastroesophageal junction, new liver lesion, mild ileus pattern involving small bowel. GI consulted. Spoke with Dr. Watt Climes from [facility], who advised patient should follow-up with Dr. Michail Sermon and should restart protonix. Advised she  should avoid NSAIDs, take tylenol as needed for pain, and follow liquid/soft diet. Recommended outpatient endoscopy and MRI as noted in radiology report. Discussed this with the patient, who reports symptom improvement and is in agreement with plan. Case management consulted regarding follow-up. Patient has appointment at [facility] on April 3rd. Strict return precautions discussed. Patient verbalizes her understanding and is in agreement with plan.  BP 133/98 mmHg  Pulse 80  Temp(Src) 98.6 F (37 C) (Oral)  Resp 16  SpO2 100%  LMP 08/19/2015      Marella Chimes, PA-C 09/03/15 2220  Charlesetta Shanks, MD 09/04/15 0020

## 2015-09-04 LAB — GC/CHLAMYDIA PROBE AMP (~~LOC~~) NOT AT ARMC
Chlamydia: NEGATIVE
NEISSERIA GONORRHEA: NEGATIVE

## 2015-09-05 LAB — URINE CULTURE

## 2015-09-15 ENCOUNTER — Ambulatory Visit: Payer: Self-pay | Attending: Family Medicine | Admitting: Family Medicine

## 2015-09-15 ENCOUNTER — Ambulatory Visit (HOSPITAL_BASED_OUTPATIENT_CLINIC_OR_DEPARTMENT_OTHER): Payer: Self-pay | Admitting: Clinical

## 2015-09-15 ENCOUNTER — Encounter: Payer: Self-pay | Admitting: Family Medicine

## 2015-09-15 ENCOUNTER — Telehealth: Payer: Self-pay

## 2015-09-15 VITALS — BP 115/82 | HR 76 | Temp 98.5°F | Resp 15 | Ht 66.0 in | Wt 145.8 lb

## 2015-09-15 DIAGNOSIS — K219 Gastro-esophageal reflux disease without esophagitis: Secondary | ICD-10-CM | POA: Insufficient documentation

## 2015-09-15 DIAGNOSIS — K769 Liver disease, unspecified: Secondary | ICD-10-CM | POA: Insufficient documentation

## 2015-09-15 DIAGNOSIS — Z79899 Other long term (current) drug therapy: Secondary | ICD-10-CM | POA: Insufficient documentation

## 2015-09-15 DIAGNOSIS — K7689 Other specified diseases of liver: Secondary | ICD-10-CM

## 2015-09-15 DIAGNOSIS — F988 Other specified behavioral and emotional disorders with onset usually occurring in childhood and adolescence: Secondary | ICD-10-CM | POA: Insufficient documentation

## 2015-09-15 DIAGNOSIS — Z7982 Long term (current) use of aspirin: Secondary | ICD-10-CM | POA: Insufficient documentation

## 2015-09-15 DIAGNOSIS — F909 Attention-deficit hyperactivity disorder, unspecified type: Secondary | ICD-10-CM

## 2015-09-15 MED ORDER — PANTOPRAZOLE SODIUM 20 MG PO TBEC: 20.0000 mg | DELAYED_RELEASE_TABLET | Freq: Every day | ORAL | Status: DC

## 2015-09-15 MED ORDER — ONDANSETRON HCL 4 MG PO TABS: 4.0000 mg | ORAL_TABLET | Freq: Three times a day (TID) | ORAL | Status: DC | PRN

## 2015-09-15 NOTE — Progress Notes (Signed)
Subjective:  Patient ID: [name], female    DOB: Mar 21, 1986  Age: 30 y.o. MRN: [id number]  CC: Follow-up   HPI [name] is a 30 year old female with a history of ADD, GERD who was recently seen at the ED for abdominal pain.  Labs revealed normal lipase, normal UA, mild leukocytosis of 13.3. CT abdomen pelvis remarkable for significant wall thickening at the gastroesophageal junction, new liver lesion, mild ileus pattern involving small bowel. GI recommendation was to restart Protonix and follow up with GI-Dr Michail Sermon.  Today she informs me that pain is a 4/10 mostly in the epigastric region and denies radiation to the back. She has associated nausea and pain is worse when she eats. Denies constipation or diarrhea and has no hematochezia. States she has Barrett's. She receives Norco from [facility] for chronic low back pain which she says "does not touch her abdominal pain".  Was previously followed by [facility] where she received albuterol for management of ADD Would also like to see a therapist because she is having a hard time as a result of her grand ma in palliative care for stage IV lung cancer  Outpatient Prescriptions Prior to Visit  Medication Sig Dispense Refill  . amphetamine-dextroamphetamine (ADDERALL) 20 MG tablet Take 1 tablet (20 mg total) by mouth 2 (two) times daily. 60 tablet 0  . HYDROcodone-acetaminophen (NORCO) 10-325 MG per tablet Take 1 tablet by mouth every 6 (six) hours as needed for moderate pain or severe pain (pain). 30 tablet 0  . Norgestimate-Ethinyl Estradiol Triphasic (TRI-PREVIFEM) 0.18/0.215/0.25 MG-35 MCG tablet Take 1 tablet by mouth daily.    . ondansetron (ZOFRAN) 4 MG tablet Take 1 tablet (4 mg total) by mouth every 6 (six) hours. 12 tablet 0  . pantoprazole (PROTONIX) 20 MG tablet Take 1 tablet (20 mg total) by mouth daily. 30 tablet 0  . aspirin-acetaminophen-caffeine (EXCEDRIN MIGRAINE) [id number] MG  tablet Take by mouth every 6 (six) hours as needed for headache. Reported on 09/15/2015    . cephALEXin (KEFLEX) 500 MG capsule Take 1 capsule (500 mg total) by mouth 3 (three) times daily. (Patient not taking: Reported on 09/03/2015) 28 capsule 0  . ondansetron (ZOFRAN ODT) 4 MG disintegrating tablet Take 1 tablet (4 mg total) by mouth every 8 (eight) hours as needed for nausea. (Patient not taking: Reported on 09/15/2015) 10 tablet 0  . ibuprofen (ADVIL,MOTRIN) 600 MG tablet Take 1 tablet (600 mg total) by mouth every 8 (eight) hours as needed. (Patient not taking: Reported on 09/03/2015) 15 tablet 0  . nitrofurantoin, macrocrystal-monohydrate, (MACROBID) 100 MG capsule Take 1 capsule (100 mg total) by mouth 2 (two) times daily. (Patient not taking: Reported on 09/03/2015) 10 capsule 0  . promethazine (PHENERGAN) 25 MG tablet Take 1 tablet (25 mg total) by mouth every 6 (six) hours as needed for nausea or vomiting. (Patient not taking: Reported on 09/03/2015) 12 tablet 0   Facility-Administered Medications Prior to Visit  Medication Dose Route Frequency Provider Last Rate Last Dose  . gadopentetate dimeglumine (MAGNEVIST) injection 14 mL  14 mL Intravenous Once PRN Melvenia Beam, MD        ROS Review of Systems  Constitutional: Negative for activity change and appetite change.  HENT: Negative for sinus pressure and sore throat.   Respiratory: Negative for chest tightness, shortness of breath and wheezing.   Cardiovascular: Negative for chest pain and palpitations.  Gastrointestinal: Positive for nausea and abdominal pain. Negative  for diarrhea, constipation, blood in stool and abdominal distention.  Genitourinary: Negative.   Musculoskeletal: Negative.   Psychiatric/Behavioral: Negative for behavioral problems and dysphoric mood.    Objective:  BP 115/82 mmHg  Pulse 76  Temp(Src) 98.5 F (36.9 C)  Resp 15  Ht 5\' 6"  (1.676 m)  Wt 145 lb 12.8 oz (66.134 kg)  BMI 23.54 kg/m2  SpO2 98%  LMP  09/15/2015  BP/Weight 09/15/2015 09/03/2015 [id number]  Systolic BP [id number] 90  Diastolic BP 82 74 52  Wt. (Lbs) 145.8 - 140  BMI 23.54 - 22.61      Physical Exam  Constitutional: She is oriented to person, place, and time. She appears well-developed and well-nourished.  Cardiovascular: Normal rate, normal heart sounds and intact distal pulses.   No murmur heard. Pulmonary/Chest: Effort normal and breath sounds normal. She has no wheezes. She has no rales. She exhibits no tenderness.  Abdominal: Soft. Bowel sounds are normal. She exhibits no distension and no mass. There is tenderness (mild epigastric and periumbilical tenderness).  Musculoskeletal: Normal range of motion.  Neurological: She is alert and oriented to person, place, and time.    CLINICAL DATA: Right lower abdominal pain  EXAM: CT ABDOMEN AND PELVIS WITH CONTRAST  TECHNIQUE: Multidetector CT imaging of the abdomen and pelvis was performed using the standard protocol following bolus administration of intravenous contrast.  CONTRAST: 56mL OMNIPAQUE IOHEXOL 300 MG/ML SOLN, 140mL ISOVUE-300 IOPAMIDOL (ISOVUE-300) INJECTION 61%  COMPARISON: 04/15/2015  FINDINGS: There is marked wall thickening of the wall of the gastroesophageal junction. The wall is low-density and up to 2.1 cm.  Postcholecystectomy.  Mild diffuse hepatic steatosis. New 1.6 cm avidly enhancing lesion in the right lobe of the liver on image 30.  Pancreas, spleen, adrenal glands, and kidneys are within normal limits.  Appendix is within normal limits.  There is moderate stool burden throughout the colon. No focal lesion involving the colon. There is mild distention of small bowel with air-fluid levels but no obvious transition point. This is most consistent with a mild ileus pattern.  Bladder is distended.  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 Uterus is otherwise within normal limits. Ovaries are within normal  limits.  No vertebral compression deformity.  IMPRESSION: Appendix is within normal limits  There is new significant wall thickening at the gastroesophageal junction. Inflammatory or neoplastic etiology are not excluded. Esophagram or endoscopy may be helpful.  New liver lesion. Neoplasm is not excluded. MRI is recommended  Mild ileus pattern involving small bowel.   Electronically Signed  By: Marybelle Killings M.D.  On: 09/03/2015 18:35  Assessment & Plan:   1. ADD (attention deficit disorder) Currently on Adderral LCSW called in for referral to psych for management of ADD; also for therapy and education about community resources for group support for end-of-life care.    2. Gastroesophageal reflux disease without esophagitis Will need Gi referral and has ben encouraged to obtain [place] discount to facilitate GI referral - ondansetron (ZOFRAN) 4 MG tablet; Take 1 tablet (4 mg total) by mouth every 8 (eight) hours as needed for nausea or vomiting.  Dispense: 40 tablet; Refill: 1 - pantoprazole (PROTONIX) 20 MG tablet; Take 1 tablet (20 mg total) by mouth daily.  Dispense: 30 tablet; Refill: 2  3. Liver lesion MRI recommended- advised to expedite [place] Discount application so MRI can be ordered   Meds ordered this encounter  Medications  . ondansetron (ZOFRAN) 4 MG tablet    Sig: Take 1 tablet (  4 mg total) by mouth every 8 (eight) hours as needed for nausea or vomiting.    Dispense:  40 tablet    Refill:  1  . pantoprazole (PROTONIX) 20 MG tablet    Sig: Take 1 tablet (20 mg total) by mouth daily.    Dispense:  30 tablet    Refill:  2    Follow-up: Return in about 3 weeks (around 10/06/2015) for follow up of GERD.   Arnoldo Morale MD

## 2015-09-15 NOTE — Progress Notes (Signed)
ASSESSMENT: Pt currently experiencing Attention deficit disorder. Pt needs to f/u with PCP and [facility]; would benefit from psychoeducation and  brief therapeutic interventions regarding coping with symptoms of ADD.  Stage of Change: contemplative  PLAN: 1. F/U with behavioral health consultant in three weeks 2. Psychiatric Medications: Adderall. 3. Behavioral recommendation(s):   -Set up appointment with [facility] for [facility] med management for ADD -Consider reading educational material regarding coping with symptoms of ADD -Set up appointment with financial counseling at [facility]  SUBJECTIVE: Pt. referred by Dr Jarold Song for ADD, counseling:  Pt. reports the following symptoms/concerns: Pt states she has experienced ADD since a child, is on Adderall; primary new concern is not knowing where to go for meds as uninsured, was denied Medicaid. Pt lack-of-attention resulted in loss of last car payment, and Pt also feeling stress over declining health of her 4yo grandmother.  Duration of problem: Over two weeks Severity: mild  OBJECTIVE: Orientation & Cognition: Oriented x3. Thought processes normal and appropriate to situation. Mood: appropriate. Affect: appropriate Appearance: appropriate Risk of harm to self or others: no known risk of harm to self or others Substance use: tobacco Assessments administered: none  Diagnosis: Attention deficit disorder(ADD) CPT Code: F90.9 -------------------------------------------- Other(s) present in the room: Intern Raquel Sarna  Time spent with patient in exam room: 20 minutes, 2:30-2:50pm

## 2015-09-15 NOTE — Progress Notes (Signed)
Patient here for follow up from ED Right sided abdominal pain 4/10 today intermittent and sharp in nature She would like refill on her pantoprazole and her ondansetron She is currrently on adderall but runs out after this month and needs help getting it prescribed

## 2015-09-15 NOTE — Telephone Encounter (Signed)
UDS done on 06/26/15 was Negative for Adderall  And + for Oxycodone, which is not prescribed here, 09/02/15 UDS was Neg for Adderall and + for Hydrocodone. Due to breach of her contract and Negative results, Per Dr.Lowne she will no longer prescribe the Adderall, the patient is aware and verbalized understanding.   KP

## 2015-09-18 ENCOUNTER — Ambulatory Visit: Payer: 59 | Admitting: Family Medicine

## 2015-09-18 MED FILL — MELOXICAM 7.5 MG TABLET: 7.5 | 30 days supply | Qty: 60 | Fill #0

## 2015-09-18 MED FILL — HYDROCODON-APAP 10-325: 10-325 | 30 days supply | Qty: 60 | Fill #0

## 2015-09-18 MED FILL — TRI-PREVIFEM TABLET: 0.18/0.215/ | 28 days supply | Qty: 28 | Fill #8

## 2015-09-28 ENCOUNTER — Encounter (HOSPITAL_BASED_OUTPATIENT_CLINIC_OR_DEPARTMENT_OTHER): Payer: Self-pay | Admitting: Emergency Medicine

## 2015-09-28 ENCOUNTER — Emergency Department (HOSPITAL_BASED_OUTPATIENT_CLINIC_OR_DEPARTMENT_OTHER)
Admission: EM | Admit: 2015-09-28 | Discharge: 2015-09-28 | Disposition: A | Discharge status: Home / Self Care | Payer: Self-pay | Attending: Emergency Medicine | Admitting: Emergency Medicine

## 2015-09-28 DIAGNOSIS — N390 Urinary tract infection, site not specified: Secondary | ICD-10-CM | POA: Insufficient documentation

## 2015-09-28 DIAGNOSIS — R51 Headache: Secondary | ICD-10-CM | POA: Insufficient documentation

## 2015-09-28 DIAGNOSIS — F17210 Nicotine dependence, cigarettes, uncomplicated: Secondary | ICD-10-CM | POA: Insufficient documentation

## 2015-09-28 DIAGNOSIS — R519 Headache, unspecified: Secondary | ICD-10-CM

## 2015-09-28 DIAGNOSIS — F319 Bipolar disorder, unspecified: Secondary | ICD-10-CM | POA: Insufficient documentation

## 2015-09-28 DIAGNOSIS — Z7982 Long term (current) use of aspirin: Secondary | ICD-10-CM | POA: Insufficient documentation

## 2015-09-28 DIAGNOSIS — K219 Gastro-esophageal reflux disease without esophagitis: Secondary | ICD-10-CM | POA: Insufficient documentation

## 2015-09-28 LAB — COMPREHENSIVE METABOLIC PANEL
ALT: 20 U/L (ref 14–54)
AST: 22 U/L (ref 15–41)
Albumin: 4 g/dL (ref 3.5–5.0)
Alkaline Phosphatase: 44 U/L (ref 38–126)
Anion gap: 7 (ref 5–15)
BILIRUBIN TOTAL: 0.7 mg/dL (ref 0.3–1.2)
BUN: 8 mg/dL (ref 6–20)
CALCIUM: 9.3 mg/dL (ref 8.9–10.3)
CO2: 26 mmol/L (ref 22–32)
CREATININE: 0.65 mg/dL (ref 0.44–1.00)
Chloride: 105 mmol/L (ref 101–111)
Glucose, Bld: 106 mg/dL — ABNORMAL HIGH (ref 65–99)
Potassium: 3.6 mmol/L (ref 3.5–5.1)
Sodium: 138 mmol/L (ref 135–145)
TOTAL PROTEIN: 7.6 g/dL (ref 6.5–8.1)

## 2015-09-28 LAB — URINALYSIS, ROUTINE W REFLEX MICROSCOPIC
Glucose, UA: NEGATIVE mg/dL
Ketones, ur: >80 mg/dL — AB
NITRITE: NEGATIVE
Protein, ur: 30 mg/dL — AB
SPECIFIC GRAVITY, URINE: 1.026 (ref 1.005–1.030)
pH: 6 (ref 5.0–8.0)

## 2015-09-28 LAB — URINE MICROSCOPIC-ADD ON

## 2015-09-28 LAB — CBC WITH DIFFERENTIAL/PLATELET
BASOS ABS: 0 10*3/uL (ref 0.0–0.1)
Basophils Relative: 0 %
Eosinophils Absolute: 0.1 10*3/uL (ref 0.0–0.7)
Eosinophils Relative: 1 %
HEMATOCRIT: 40.2 % (ref 36.0–46.0)
Hemoglobin: 13.3 g/dL (ref 12.0–15.0)
LYMPHS ABS: 2.3 10*3/uL (ref 0.7–4.0)
LYMPHS PCT: 18 %
MCH: 28.1 pg (ref 26.0–34.0)
MCHC: 33.1 g/dL (ref 30.0–36.0)
MCV: 84.8 fL (ref 78.0–100.0)
MONO ABS: 1 10*3/uL (ref 0.1–1.0)
Monocytes Relative: 8 %
NEUTROS ABS: 9.2 10*3/uL — AB (ref 1.7–7.7)
Neutrophils Relative %: 73 %
Platelets: 247 10*3/uL (ref 150–400)
RBC: 4.74 MIL/uL (ref 3.87–5.11)
RDW: 13.2 % (ref 11.5–15.5)
WBC: 12.6 10*3/uL — AB (ref 4.0–10.5)

## 2015-09-28 LAB — PREGNANCY, URINE: PREG TEST UR: NEGATIVE

## 2015-09-28 LAB — LIPASE, BLOOD: LIPASE: 30 U/L (ref 11–51)

## 2015-09-28 MED ORDER — SODIUM CHLORIDE 0.9 % IV BOLUS (SEPSIS)
1000.0000 mL | Freq: Once | INTRAVENOUS | Status: AC
  Administered 2015-09-28: 1000 mL via INTRAVENOUS

## 2015-09-28 MED ORDER — PROCHLORPERAZINE EDISYLATE 5 MG/ML IJ SOLN
10.0000 mg | Freq: Once | INTRAMUSCULAR | Status: AC
  Administered 2015-09-28: 10 mg via INTRAVENOUS
  Filled 2015-09-28: qty 2

## 2015-09-28 MED ORDER — DEXAMETHASONE SODIUM PHOSPHATE 10 MG/ML IJ SOLN
10.0000 mg | Freq: Once | INTRAMUSCULAR | Status: AC
  Administered 2015-09-28: 10 mg via INTRAVENOUS
  Filled 2015-09-28: qty 1

## 2015-09-28 MED ORDER — KETOROLAC TROMETHAMINE 30 MG/ML IJ SOLN
30.0000 mg | Freq: Once | INTRAMUSCULAR | Status: AC
  Administered 2015-09-28: 30 mg via INTRAVENOUS
  Filled 2015-09-28: qty 1

## 2015-09-28 MED ORDER — SUCRALFATE 1 G PO TABS: 1.0000 g | ORAL_TABLET | Freq: Three times a day (TID) | ORAL | Status: DC

## 2015-09-28 MED ORDER — CEPHALEXIN 500 MG PO CAPS: 500.0000 mg | ORAL_CAPSULE | Freq: Three times a day (TID) | ORAL | Status: DC

## 2015-09-28 MED ORDER — GI COCKTAIL ~~LOC~~
30.0000 mL | Freq: Once | ORAL | Status: AC
  Administered 2015-09-28: 30 mL via ORAL
  Filled 2015-09-28: qty 30

## 2015-09-28 MED ORDER — DIPHENHYDRAMINE HCL 50 MG/ML IJ SOLN
12.5000 mg | Freq: Once | INTRAMUSCULAR | Status: AC
  Administered 2015-09-28: 12.5 mg via INTRAVENOUS
  Filled 2015-09-28: qty 1

## 2015-09-28 MED ORDER — MORPHINE SULFATE (PF) 4 MG/ML IV SOLN
4.0000 mg | Freq: Once | INTRAVENOUS | Status: AC
  Administered 2015-09-28: 4 mg via INTRAVENOUS
  Filled 2015-09-28: qty 1

## 2015-09-28 NOTE — Discharge Instructions (Signed)
1. Medications: tylenol for headache, carafate and protonix for GERD, keflex for UTI, usual home medications 2. Treatment: rest, drink plenty of fluids 3. Follow Up: please followup with your primary doctor and with GI for discussion of your diagnoses and further evaluation after today's visit; if you do not have a primary care doctor use the phone number listed in your discharge paperwork to find one; please return to the ER for high fever, severe pain, new or worsening symptoms   Gastroesophageal Reflux Disease, Adult Normally, food travels down the esophagus and stays in the stomach to be digested. If a person has gastroesophageal reflux disease (GERD), food and stomach acid move back up into the esophagus. When this happens, the esophagus becomes sore and swollen (inflamed). Over time, GERD can make small holes (ulcers) in the lining of the esophagus. HOME CARE Diet  Follow a diet as told by your doctor. You may need to avoid foods and drinks such as:  Coffee and tea (with or without caffeine).  Drinks that contain alcohol.  Energy drinks and sports drinks.  Carbonated drinks or sodas.  Chocolate and cocoa.  Peppermint and mint flavorings.  Garlic and onions.  Horseradish.  Spicy and acidic foods, such as peppers, chili powder, curry powder, vinegar, hot sauces, and BBQ sauce.  Citrus fruit juices and citrus fruits, such as oranges, lemons, and limes.  Tomato-based foods, such as red sauce, chili, salsa, and pizza with red sauce.  Fried and fatty foods, such as donuts, french fries, potato chips, and high-fat dressings.  High-fat meats, such as hot dogs, rib eye steak, sausage, ham, and bacon.  High-fat dairy items, such as whole milk, butter, and cream cheese.  Eat small meals often. Avoid eating large meals.  Avoid drinking large amounts of liquid with your meals.  Avoid eating meals during the 2-3 hours before bedtime.  Avoid lying down right after you eat.  Do  not exercise right after you eat. General Instructions  Pay attention to any changes in your symptoms.  Take over-the-counter and prescription medicines only as told by your doctor. Do not take aspirin, ibuprofen, or other NSAIDs unless your doctor says it is okay.  Do not use any tobacco products, including cigarettes, chewing tobacco, and e-cigarettes. If you need help quitting, ask your doctor.  Wear loose clothes. Do not wear anything tight around your waist.  Raise (elevate) the head of your bed about 6 inches (15 cm).  Try to lower your stress. If you need help doing this, ask your doctor.  If you are overweight, lose an amount of weight that is healthy for you. Ask your doctor about a safe weight loss goal.  Keep all follow-up visits as told by your doctor. This is important. GET HELP IF:  You have new symptoms.  You lose weight and you do not know why it is happening.  You have trouble swallowing, or it hurts to swallow.  You have wheezing or a cough that keeps happening.  Your symptoms do not get better with treatment.  You have a hoarse voice. GET HELP RIGHT AWAY IF:  You have pain in your arms, neck, jaw, teeth, or back.  You feel sweaty, dizzy, or light-headed.  You have chest pain or shortness of breath.  You throw up (vomit) and your throw up looks like blood or coffee grounds.  You pass out (faint).  Your poop (stool) is bloody or black.  You cannot swallow, drink, or eat.   This information  is not intended to replace advice given to you by your health care provider. Make sure you discuss any questions you have with your health care provider.   Document Released: 11/17/2007 Document Revised: 02/19/2015 Document Reviewed: 09/25/2014 Elsevier Interactive Patient Education 2016 [employer].  Migraine Headache A migraine headache is very bad, throbbing pain on one or both sides of your head. Talk to your doctor about what things may bring on  (trigger) your migraine headaches. HOME CARE  Only take medicines as told by your doctor.  Lie down in a dark, quiet room when you have a migraine.  Keep a journal to find out if certain things bring on migraine headaches. For example, write down:  What you eat and drink.  How much sleep you get.  Any change to your diet or medicines.  Lessen how much alcohol you drink.  Quit smoking if you smoke.  Get enough sleep.  Lessen any stress in your life.  Keep lights dim if bright lights bother you or make your migraines worse. GET HELP RIGHT AWAY IF:   Your migraine becomes really bad.  You have a fever.  You have a stiff neck.  You have trouble seeing.  Your muscles are weak, or you lose muscle control.  You lose your balance or have trouble walking.  You feel like you will pass out (faint), or you pass out.  You have really bad symptoms that are different than your first symptoms. MAKE SURE YOU:   Understand these instructions.  Will watch your condition.  Will get help right away if you are not doing well or get worse.   This information is not intended to replace advice given to you by your health care provider. Make sure you discuss any questions you have with your health care provider.   Document Released: 03/09/2008 Document Revised: 08/23/2011 Document Reviewed: 02/05/2013 Elsevier Interactive Patient Education 2016 Elsevier Inc.  Urinary Tract Infection A urinary tract infection (UTI) can occur any place along the urinary tract. The tract includes the kidneys, ureters, bladder, and urethra. A type of germ called bacteria often causes a UTI. UTIs are often helped with antibiotic medicine.  HOME CARE   If given, take antibiotics as told by your doctor. Finish them even if you start to feel better.  Drink enough fluids to keep your pee (urine) clear or pale yellow.  Avoid tea, drinks with caffeine, and bubbly (carbonated) drinks.  Pee often. Avoid  holding your pee in for a long time.  Pee before and after having sex (intercourse).  Wipe from front to back after you poop (bowel movement) if you are a woman. Use each tissue only once. GET HELP RIGHT AWAY IF:   You have back pain.  You have lower belly (abdominal) pain.  You have chills.  You feel sick to your stomach (nauseous).  You throw up (vomit).  Your burning or discomfort with peeing does not go away.  You have a fever.  Your symptoms are not better in 3 days. MAKE SURE YOU:   Understand these instructions.  Will watch your condition.  Will get help right away if you are not doing well or get worse.   This information is not intended to replace advice given to you by your health care provider. Make sure you discuss any questions you have with your health care provider.   Document Released: 11/17/2007 Document Revised: 06/21/2014 Document Reviewed: 12/30/2011 Elsevier Interactive Patient Education [employer].

## 2015-09-28 NOTE — ED Notes (Signed)
Pt was sleeping when RN entered room.  Easily aroused and states she feels "drowsy but my stomach still hurts".  Pt states headache is "still there" but less painful than earlier.

## 2015-09-28 NOTE — ED Provider Notes (Signed)
CSN: [id number]     Arrival date & time 09/28/15  1702 History   First MD Initiated Contact with Patient 09/28/15 1837     Chief Complaint  Patient presents with  . Migraine  . Abdominal Pain    HPI   [name] is a 30 y.o. female with a PMH of GERD, barrett esophagus, pyelonephritis, who presents to the ED with several complaints. She notes headache, which she states started yesterday. She states light exacerbates her pain. She denies alleviating factors. She states she has a history of migraines and her symptoms feel similar. She denies fever, chills, dizziness, lightheadedness, vision changes, numbness, weakness, paresthesia. She also reports epigastric abdominal pain, which feels consistent with her history of GERD. She denies exacerbating factors. She has not tried anything for symptom relief. She also notes dysuria and right flank pain, which started this morning. She states she feels that she had a bladder infection. She denies nausea, vomiting, diarrhea, constipation.   Past Medical History  Diagnosis Date  . Gastric reflux   . Anxiety   . Basedow's disease   . Pyelonephritis   . History of nephrolithiasis   . Anxiety disorder   . Bipolar disorder ([place])   . Interstitial cystitis   . Chronic back pain   . Fibroid   . Barrett esophagus   . ADD (attention deficit disorder)   . Back pain    Past Surgical History  Procedure Laterality Date  . Tonsillectomy    . Adenoidectomy    . Tear duct probing    . Cholecystectomy N/A 01/05/2014    Procedure: LAPAROSCOPIC CHOLECYSTECTOMY WITH INTRAOPERATIVE CHOLANGIOGRAM;  Surgeon: Edward Jolly, MD;  Location: [facility];  Service: General;  Laterality: N/A;  . Esophagogastroduodenoscopy (egd) with propofol N/A 06/18/2014    Procedure: ESOPHAGOGASTRODUODENOSCOPY (EGD) WITH PROPOFOL;  Surgeon: Wonda Horner, MD;  Location: [facility] ENDOSCOPY;  Service: Endoscopy;  Laterality: N/A;  . Wisdom tooth extraction     Family History  Problem  Relation Age of Onset  . Diabetes    . Hypertension    . Lung cancer    . Bipolar disorder Sister    Social History  Substance Use Topics  . Smoking status: Current Every Day Smoker -- 0.50 packs/day for 8 years    Types: Cigarettes  . Smokeless tobacco: Never Used  . Alcohol Use: No   OB History    Gravida Para Term Preterm AB TAB SAB Ectopic Multiple Living   1 1 1       1        Review of Systems  Constitutional: Negative for fever and chills.  Gastrointestinal: Positive for abdominal pain. Negative for nausea, vomiting, diarrhea and constipation.  Genitourinary: Positive for dysuria and flank pain. Negative for urgency and frequency.  Neurological: Positive for headaches. Negative for dizziness, syncope, weakness, light-headedness and numbness.  All other systems reviewed and are negative.     Allergies  Review of patient's allergies indicates no known allergies.  Home Medications   Prior to Admission medications   Medication Sig Start Date End Date Taking? Authorizing Provider  amphetamine-dextroamphetamine (ADDERALL) 20 MG tablet Take 1 tablet (20 mg total) by mouth 2 (two) times daily. 08/28/15   Rosalita Chessman Chase, DO  aspirin-acetaminophen-caffeine (EXCEDRIN MIGRAINE) [phone] MG tablet Take by mouth every 6 (six) hours as needed for headache. Reported on 09/15/2015    Historical Provider, MD  cephALEXin (KEFLEX) 500 MG capsule Take 1 capsule (500 mg total) by  mouth 3 (three) times daily. Patient not taking: Reported on 09/03/2015 06/19/15   Jola Schmidt, MD  HYDROcodone-acetaminophen [facility]) 10-325 MG per tablet Take 1 tablet by mouth every 6 (six) hours as needed for moderate pain or severe pain (pain). 10/07/14   Rosalita Chessman Chase, DO  Norgestimate-Ethinyl Estradiol Triphasic (TRI-PREVIFEM) 0.18/0.215/0.25 MG-35 MCG tablet Take 1 tablet by mouth daily.    Historical Provider, MD  ondansetron (ZOFRAN ODT) 4 MG disintegrating tablet Take 1 tablet (4 mg total) by  mouth every 8 (eight) hours as needed for nausea. Patient not taking: Reported on 09/15/2015 09/03/15   Marella Chimes, PA-C  ondansetron (ZOFRAN) 4 MG tablet Take 1 tablet (4 mg total) by mouth every 8 (eight) hours as needed for nausea or vomiting. 09/15/15   Arnoldo Morale, MD  pantoprazole (PROTONIX) 20 MG tablet Take 1 tablet (20 mg total) by mouth daily. 09/15/15   Arnoldo Morale, MD    BP 114/76 mmHg  Pulse 82  Temp(Src) 98.8 F (37.1 C) (Oral)  Resp 16  Ht 5\' 6"  (1.676 m)  Wt 63.504 kg  BMI 22.61 kg/m2  SpO2 100%  LMP 09/15/2015 Physical Exam  Constitutional: She is oriented to person, place, and time. She appears well-developed and well-nourished. No distress.  HENT:  Head: Normocephalic and atraumatic.  Right Ear: External ear normal.  Left Ear: External ear normal.  Nose: Nose normal.  Mouth/Throat: Uvula is midline, oropharynx is clear and moist and mucous membranes are normal.  Eyes: Conjunctivae, EOM and lids are normal. Pupils are equal, round, and reactive to light. Right eye exhibits no discharge. Left eye exhibits no discharge. No scleral icterus.  Neck: Normal range of motion. Neck supple.  Cardiovascular: Normal rate, regular rhythm, normal heart sounds, intact distal pulses and normal pulses.   Pulmonary/Chest: Effort normal and breath sounds normal. No respiratory distress. She has no wheezes. She has no rales.  Abdominal: Soft. Normal appearance and bowel sounds are normal. She exhibits no distension and no mass. There is no tenderness. There is no rigidity, no rebound and no guarding.  TTP in epigastrium. Right sided CVA tenderness.  Musculoskeletal: Normal range of motion. She exhibits no edema or tenderness.  Neurological: She is alert and oriented to person, place, and time. She has normal strength. No cranial nerve deficit or sensory deficit.  Skin: Skin is warm, dry and intact. She is not diaphoretic.  Psychiatric: She has a normal mood and affect. Her speech  is normal and behavior is normal.  Nursing note and vitals reviewed.   ED Course  Procedures (including critical care time)  Labs Review Labs Reviewed  URINALYSIS, ROUTINE W REFLEX MICROSCOPIC (NOT AT [facility]) - Abnormal; Notable for the following:    Color, Urine AMBER (*)    APPearance TURBID (*)    Hgb urine dipstick SMALL (*)    Bilirubin Urine MODERATE (*)    Ketones, ur >80 (*)    Protein, ur 30 (*)    Leukocytes, UA MODERATE (*)    All other components within normal limits  URINE MICROSCOPIC-ADD ON - Abnormal; Notable for the following:    Squamous Epithelial / LPF 6-30 (*)    Bacteria, UA FEW (*)    All other components within normal limits  CBC WITH DIFFERENTIAL/PLATELET - Abnormal; Notable for the following:    WBC 12.6 (*)    Neutro Abs 9.2 (*)    All other components within normal limits  COMPREHENSIVE METABOLIC PANEL - Abnormal; Notable for  the following:    Glucose, Bld 106 (*)    All other components within normal limits  PREGNANCY, URINE  LIPASE, BLOOD    Imaging Review No results found.   I have personally reviewed and evaluated these lab results as part of my medical decision-making.   EKG Interpretation None      MDM   Final diagnoses:  Headache, unspecified headache type  Gastroesophageal reflux disease, esophagitis presence not specified  UTI (lower urinary tract infection)    30 year old female presents with several complaints. Notes migraine, epigastric abdominal pain, and dysuria. Denies dizziness, lightheadedness, vision changes, numbness, weakness, paresthesia, fever, chills, N/V/D/C.   Patient is afebrile. Vital signs stable. Normal neuro exam with no focal deficit. Heart regular rate and rhythm. Lungs clear to auscultation bilaterally. Abdomen soft, nondistended, with mild tenderness to palpation in epigastrium. No rebound, guarding, or masses. Right-sided CVA tenderness present.  CBC remarkable for leukocytosis of 12.6. CMP  unremarkable. Lipase within normal limits. UA with moderate leukocytes, 6-30 WBC, few bacteria. Urine culture ordered. Urine pregnancy negative.  Patient given GI cocktail, fluids, compazine, benadryl, toradol, pain medication. On reassessment, she reports symptom improvement. Patient is nontoxic and well-appearing, feel she is stable for discharge at this time. Headache likely due to migraine. Epigastric pain consistent with GERD. Will give keflex for UTI and possible pyelo. Patient to follow-up with PCP. Strict return precautions discussed. Patient verbalizes her understanding and is in agreement with plan.  BP 100/62 mmHg  Pulse 52  Temp(Src) 98 F (36.7 C) (Oral)  Resp 16  Ht 5\' 6"  (1.676 m)  Wt 63.504 kg  BMI 22.61 kg/m2  SpO2 99%  LMP 09/15/2015        Marella Chimes, PA-C 09/29/15 0231  Blanchie Dessert, MD 10/01/15 1429

## 2015-09-28 NOTE — ED Notes (Signed)
Pt given d/c instructions. Rx x 2. Verbalizes understanding. No questions.

## 2015-09-28 NOTE — ED Notes (Signed)
Pt in c/o migraine and epigastric pain x 3 days. States hx of Barrett's disease and migraines.

## 2015-09-30 MED FILL — CEPHALEXIN 500 MG CAPSULE: 500 | 10 days supply | Qty: 30 | Fill #0

## 2015-09-30 MED FILL — SUCRALFATE 1 GM TABLET: 1 | 7 days supply | Qty: 30 | Fill #0

## 2015-10-06 ENCOUNTER — Ambulatory Visit: Payer: Self-pay | Admitting: Family Medicine

## 2015-10-10 MED FILL — TRI-PREVIFEM TABLET: 0.18/0.215/ | 28 days supply | Qty: 28 | Fill #9

## 2015-10-21 ENCOUNTER — Emergency Department (HOSPITAL_BASED_OUTPATIENT_CLINIC_OR_DEPARTMENT_OTHER)
Admission: EM | Admit: 2015-10-21 | Discharge: 2015-10-21 | Disposition: A | Discharge status: Home / Self Care | Payer: Self-pay | Attending: Emergency Medicine | Admitting: Emergency Medicine

## 2015-10-21 ENCOUNTER — Encounter (HOSPITAL_BASED_OUTPATIENT_CLINIC_OR_DEPARTMENT_OTHER): Payer: Self-pay | Admitting: *Deleted

## 2015-10-21 DIAGNOSIS — F17210 Nicotine dependence, cigarettes, uncomplicated: Secondary | ICD-10-CM | POA: Insufficient documentation

## 2015-10-21 DIAGNOSIS — F319 Bipolar disorder, unspecified: Secondary | ICD-10-CM | POA: Insufficient documentation

## 2015-10-21 DIAGNOSIS — R109 Unspecified abdominal pain: Secondary | ICD-10-CM | POA: Insufficient documentation

## 2015-10-21 DIAGNOSIS — N3000 Acute cystitis without hematuria: Secondary | ICD-10-CM

## 2015-10-21 LAB — URINALYSIS, ROUTINE W REFLEX MICROSCOPIC
GLUCOSE, UA: NEGATIVE mg/dL
KETONES UR: NEGATIVE mg/dL
Nitrite: NEGATIVE
PH: 6.5 (ref 5.0–8.0)
PROTEIN: NEGATIVE mg/dL
Specific Gravity, Urine: 1.03 (ref 1.005–1.030)

## 2015-10-21 LAB — URINE MICROSCOPIC-ADD ON

## 2015-10-21 LAB — PREGNANCY, URINE: Preg Test, Ur: NEGATIVE

## 2015-10-21 MED ORDER — CIPROFLOXACIN HCL 500 MG PO TABS: 500.0000 mg | ORAL_TABLET | Freq: Two times a day (BID) | ORAL | Status: DC

## 2015-10-21 NOTE — ED Notes (Signed)
Rx x 1 given for Cipro

## 2015-10-21 NOTE — ED Notes (Signed)
Delo MD at bedside.

## 2015-10-21 NOTE — Discharge Instructions (Signed)
Cipro as prescribed.  Ibuprofen 600 mg every 6 hours as needed for pain.  Return to the emergency department if you develop worsening pain, high fever, or other new and concerning symptoms.   Urinary Tract Infection Urinary tract infections (UTIs) can develop anywhere along your urinary tract. Your urinary tract is your body's drainage system for removing wastes and extra water. Your urinary tract includes two kidneys, two ureters, a bladder, and a urethra. Your kidneys are a pair of bean-shaped organs. Each kidney is about the size of your fist. They are located below your ribs, one on each side of your spine. CAUSES Infections are caused by microbes, which are microscopic organisms, including fungi, viruses, and bacteria. These organisms are so small that they can only be seen through a microscope. Bacteria are the microbes that most commonly cause UTIs. SYMPTOMS  Symptoms of UTIs may vary by age and gender of the patient and by the location of the infection. Symptoms in young women typically include a frequent and intense urge to urinate and a painful, burning feeling in the bladder or urethra during urination. Older women and men are more likely to be tired, shaky, and weak and have muscle aches and abdominal pain. A fever may mean the infection is in your kidneys. Other symptoms of a kidney infection include pain in your back or sides below the ribs, nausea, and vomiting. DIAGNOSIS To diagnose a UTI, your caregiver will ask you about your symptoms. Your caregiver will also ask you to provide a urine sample. The urine sample will be tested for bacteria and white blood cells. White blood cells are made by your body to help fight infection. TREATMENT  Typically, UTIs can be treated with medication. Because most UTIs are caused by a bacterial infection, they usually can be treated with the use of antibiotics. The choice of antibiotic and length of treatment depend on your symptoms and the type of  bacteria causing your infection. HOME CARE INSTRUCTIONS  If you were prescribed antibiotics, take them exactly as your caregiver instructs you. Finish the medication even if you feel better after you have only taken some of the medication.  Drink enough water and fluids to keep your urine clear or pale yellow.  Avoid caffeine, tea, and carbonated beverages. They tend to irritate your bladder.  Empty your bladder often. Avoid holding urine for long periods of time.  Empty your bladder before and after sexual intercourse.  After a bowel movement, women should cleanse from front to back. Use each tissue only once. SEEK MEDICAL CARE IF:   You have back pain.  You develop a fever.  Your symptoms do not begin to resolve within 3 days. SEEK IMMEDIATE MEDICAL CARE IF:   You have severe back pain or lower abdominal pain.  You develop chills.  You have nausea or vomiting.  You have continued burning or discomfort with urination. MAKE SURE YOU:   Understand these instructions.  Will watch your condition.  Will get help right away if you are not doing well or get worse.   This information is not intended to replace advice given to you by your health care provider. Make sure you discuss any questions you have with your health care provider.   Document Released: 03/10/2005 Document Revised: 02/19/2015 Document Reviewed: 07/09/2011 Elsevier Interactive Patient Education [employer].

## 2015-10-21 NOTE — ED Provider Notes (Signed)
CSN: [id number]     Arrival date & time 10/21/15  1857 History  By signing my name below, I, Arianna Nassar, attest that this documentation has been prepared under the direction and in the presence of Veryl Speak, MD. Electronically Signed: Julien Nordmann, ED Scribe. 10/21/2015. 8:20 PM.    Chief Complaint  Patient presents with  . Abdominal Pain      The history is provided by the patient. No language interpreter was used.   HPI Comments: [name] is a 30 y.o. female who has a PMHx of gastric reflux, basedow's disease, chronic back pain and interstitial cystitis presents to the Emergency Department complaining of sudden onset, gradual worsening, moderate sharp, right flank pain onset yesterday. She reports having burning with urination and dysuria. Pt says her symptoms feel like a kidney or bladder infection that she has had in the past. She has not taken any medication to alleviate her pain. She denies abdominal pain, diarrhea, constipation, fever, irregular menstrual cycle, irregular vaginal bleeding, irregular vaginal discharge. Her last menstrual cycle was last week.  Past Medical History  Diagnosis Date  . Gastric reflux   . Anxiety   . Basedow's disease   . Pyelonephritis   . History of nephrolithiasis   . Anxiety disorder   . Bipolar disorder ([place])   . Interstitial cystitis   . Chronic back pain   . Fibroid   . Barrett esophagus   . ADD (attention deficit disorder)   . Back pain    Past Surgical History  Procedure Laterality Date  . Tonsillectomy    . Adenoidectomy    . Tear duct probing    . Cholecystectomy N/A 01/05/2014    Procedure: LAPAROSCOPIC CHOLECYSTECTOMY WITH INTRAOPERATIVE CHOLANGIOGRAM;  Surgeon: Edward Jolly, MD;  Location: [facility];  Service: General;  Laterality: N/A;  . Esophagogastroduodenoscopy (egd) with propofol N/A 06/18/2014    Procedure: ESOPHAGOGASTRODUODENOSCOPY (EGD) WITH PROPOFOL;  Surgeon: Wonda Horner, MD;  Location: [facility]  ENDOSCOPY;  Service: Endoscopy;  Laterality: N/A;  . Wisdom tooth extraction     Family History  Problem Relation Age of Onset  . Diabetes    . Hypertension    . Lung cancer    . Bipolar disorder Sister    Social History  Substance Use Topics  . Smoking status: Current Every Day Smoker -- 0.50 packs/day for 8 years    Types: Cigarettes  . Smokeless tobacco: Never Used  . Alcohol Use: No   OB History    Gravida Para Term Preterm AB TAB SAB Ectopic Multiple Living   1 1 1       1      Review of Systems  A complete 10 system review of systems was obtained and all systems are negative except as noted in the HPI and PMH.    Allergies  Review of patient's allergies indicates no known allergies.  Home Medications   Prior to Admission medications   Medication Sig Start Date End Date Taking? Authorizing Provider  amphetamine-dextroamphetamine (ADDERALL) 20 MG tablet Take 1 tablet (20 mg total) by mouth 2 (two) times daily. 08/28/15   Rosalita Chessman Chase, DO  aspirin-acetaminophen-caffeine (EXCEDRIN MIGRAINE) [phone] MG tablet Take by mouth every 6 (six) hours as needed for headache. Reported on 09/15/2015    Historical Provider, MD  cephALEXin (KEFLEX) 500 MG capsule Take 1 capsule (500 mg total) by mouth 3 (three) times daily. 09/28/15   Marella Chimes, PA-C  HYDROcodone-acetaminophen (NORCO) 10-325 MG per  tablet Take 1 tablet by mouth every 6 (six) hours as needed for moderate pain or severe pain (pain). 10/07/14   Rosalita Chessman Chase, DO  Norgestimate-Ethinyl Estradiol Triphasic (TRI-PREVIFEM) 0.18/0.215/0.25 MG-35 MCG tablet Take 1 tablet by mouth daily.    Historical Provider, MD  ondansetron (ZOFRAN ODT) 4 MG disintegrating tablet Take 1 tablet (4 mg total) by mouth every 8 (eight) hours as needed for nausea. Patient not taking: Reported on 09/15/2015 09/03/15   Marella Chimes, PA-C  ondansetron (ZOFRAN) 4 MG tablet Take 1 tablet (4 mg total) by mouth every 8 (eight)  hours as needed for nausea or vomiting. 09/15/15   Arnoldo Morale, MD  pantoprazole (PROTONIX) 20 MG tablet Take 1 tablet (20 mg total) by mouth daily. 09/15/15   Arnoldo Morale, MD  sucralfate (CARAFATE) 1 g tablet Take 1 tablet (1 g total) by mouth 4 (four) times daily -  with meals and at bedtime. 09/28/15   Marella Chimes, PA-C   Triage vitals: BP 110/72 mmHg  Pulse 80  Temp(Src) 98.9 F (37.2 C) (Oral)  Resp 20  Ht 5\' 6"  (1.676 m)  Wt 140 lb (63.504 kg)  BMI 22.61 kg/m2  SpO2 99%  LMP 10/14/2015 Physical Exam  Constitutional: She is oriented to person, place, and time. She appears well-developed and well-nourished. No distress.  HENT:  Head: Normocephalic and atraumatic.  Eyes: EOM are normal.  Neck: Normal range of motion.  Cardiovascular: Normal rate, regular rhythm and normal heart sounds.   Pulmonary/Chest: Effort normal and breath sounds normal.  Abdominal: Soft. She exhibits no distension.  Musculoskeletal: Normal range of motion. She exhibits tenderness.  Mild tenderness in the right flank  Neurological: She is alert and oriented to person, place, and time.  Skin: Skin is warm and dry.  Psychiatric: She has a normal mood and affect. Judgment normal.  Nursing note and vitals reviewed.   ED Course  Procedures  DIAGNOSTIC STUDIES: Oxygen Saturation is 99% on RA, normal by my interpretation.  COORDINATION OF CARE:  8:18 PM Discussed treatment plan which includes antibiotic and anti-inflammatory with pt at bedside and pt agreed to plan.  Labs Review Labs Reviewed  URINALYSIS, ROUTINE W REFLEX MICROSCOPIC (NOT AT [facility]) - Abnormal; Notable for the following:    APPearance TURBID (*)    Hgb urine dipstick SMALL (*)    Bilirubin Urine SMALL (*)    Leukocytes, UA MODERATE (*)    All other components within normal limits  URINE MICROSCOPIC-ADD ON - Abnormal; Notable for the following:    Squamous Epithelial / LPF TOO NUMEROUS TO COUNT (*)    Bacteria, UA MANY (*)     All other components within normal limits  PREGNANCY, URINE    Imaging Review No results found. I have personally reviewed and evaluated these images and lab results as part of my medical decision-making.   EKG Interpretation None      MDM   Final diagnoses:  None    Will treat as UTI, return prn.  I personally performed the services described in this documentation, which was scribed in my presence. The recorded information has been reviewed and is accurate.     Veryl Speak, MD 10/21/15 2221

## 2015-10-21 NOTE — ED Notes (Signed)
She feels like she has a UTI. Pain in her back and abdomen since yesterday. She worked today and pain got worse.

## 2015-10-27 ENCOUNTER — Emergency Department (HOSPITAL_BASED_OUTPATIENT_CLINIC_OR_DEPARTMENT_OTHER)
Admission: EM | Admit: 2015-10-27 | Discharge: 2015-10-27 | Discharge status: Against Medical Advice | Payer: Self-pay | Attending: Emergency Medicine | Admitting: Emergency Medicine

## 2015-10-27 ENCOUNTER — Encounter (HOSPITAL_BASED_OUTPATIENT_CLINIC_OR_DEPARTMENT_OTHER): Payer: Self-pay

## 2015-10-27 DIAGNOSIS — R1011 Right upper quadrant pain: Secondary | ICD-10-CM

## 2015-10-27 DIAGNOSIS — N898 Other specified noninflammatory disorders of vagina: Secondary | ICD-10-CM | POA: Insufficient documentation

## 2015-10-27 DIAGNOSIS — F17210 Nicotine dependence, cigarettes, uncomplicated: Secondary | ICD-10-CM | POA: Insufficient documentation

## 2015-10-27 DIAGNOSIS — R197 Diarrhea, unspecified: Secondary | ICD-10-CM | POA: Insufficient documentation

## 2015-10-27 DIAGNOSIS — F319 Bipolar disorder, unspecified: Secondary | ICD-10-CM | POA: Insufficient documentation

## 2015-10-27 LAB — URINALYSIS, ROUTINE W REFLEX MICROSCOPIC
Glucose, UA: NEGATIVE mg/dL
Ketones, ur: 15 mg/dL — AB
LEUKOCYTES UA: NEGATIVE
NITRITE: NEGATIVE
PH: 6 (ref 5.0–8.0)
Protein, ur: NEGATIVE mg/dL
SPECIFIC GRAVITY, URINE: 1.025 (ref 1.005–1.030)

## 2015-10-27 LAB — COMPREHENSIVE METABOLIC PANEL
ALT: 24 U/L (ref 14–54)
AST: 23 U/L (ref 15–41)
Albumin: 3.9 g/dL (ref 3.5–5.0)
Alkaline Phosphatase: 37 U/L — ABNORMAL LOW (ref 38–126)
Anion gap: 8 (ref 5–15)
BUN: 7 mg/dL (ref 6–20)
CHLORIDE: 105 mmol/L (ref 101–111)
CO2: 25 mmol/L (ref 22–32)
CREATININE: 0.7 mg/dL (ref 0.44–1.00)
Calcium: 9.1 mg/dL (ref 8.9–10.3)
Glucose, Bld: 91 mg/dL (ref 65–99)
Potassium: 3.6 mmol/L (ref 3.5–5.1)
Sodium: 138 mmol/L (ref 135–145)
Total Bilirubin: 0.6 mg/dL (ref 0.3–1.2)
Total Protein: 7.1 g/dL (ref 6.5–8.1)

## 2015-10-27 LAB — CBC WITH DIFFERENTIAL/PLATELET
BASOS ABS: 0 10*3/uL (ref 0.0–0.1)
BASOS PCT: 0 %
EOS ABS: 0.2 10*3/uL (ref 0.0–0.7)
EOS PCT: 2 %
HCT: 40.9 % (ref 36.0–46.0)
Hemoglobin: 13.5 g/dL (ref 12.0–15.0)
LYMPHS PCT: 25 %
Lymphs Abs: 2.2 10*3/uL (ref 0.7–4.0)
MCH: 28.4 pg (ref 26.0–34.0)
MCHC: 33 g/dL (ref 30.0–36.0)
MCV: 85.9 fL (ref 78.0–100.0)
Monocytes Absolute: 0.5 10*3/uL (ref 0.1–1.0)
Monocytes Relative: 6 %
Neutro Abs: 5.9 10*3/uL (ref 1.7–7.7)
Neutrophils Relative %: 67 %
PLATELETS: 175 10*3/uL (ref 150–400)
RBC: 4.76 MIL/uL (ref 3.87–5.11)
RDW: 13.6 % (ref 11.5–15.5)
WBC: 8.9 10*3/uL (ref 4.0–10.5)

## 2015-10-27 LAB — URINE MICROSCOPIC-ADD ON

## 2015-10-27 LAB — PREGNANCY, URINE: Preg Test, Ur: NEGATIVE

## 2015-10-27 LAB — LIPASE, BLOOD: LIPASE: 21 U/L (ref 11–51)

## 2015-10-27 MED ORDER — ONDANSETRON 8 MG PO TBDP
8.0000 mg | ORAL_TABLET | Freq: Once | ORAL | Status: AC
  Administered 2015-10-27: 8 mg via ORAL
  Filled 2015-10-27: qty 1

## 2015-10-27 MED ORDER — OXYCODONE-ACETAMINOPHEN 5-325 MG PO TABS
1.0000 | ORAL_TABLET | Freq: Once | ORAL | Status: AC
Start: 2015-10-27 — End: 2015-10-27
  Administered 2015-10-27: 1 via ORAL
  Filled 2015-10-27: qty 1

## 2015-10-27 NOTE — ED Provider Notes (Signed)
CSN: [id number]     Arrival date & time 10/27/15  1113 History   First MD Initiated Contact with Patient 10/27/15 1218     Chief Complaint  Patient presents with  . Diarrhea     (Consider location/radiation/quality/duration/timing/severity/associated sxs/prior Treatment) HPI Comments: 30 year old female with past medical history including bipolar disorder, chronic back pain, ADHD, interstitial cystitis who presents with diarrhea and vaginal discharge. The patient was evaluated last week and diagnosed with UTI. She was given a course of ciprofloxacin which she finished yesterday. Yesterday evening, she began having nonbloody diarrhea and has had 4 episodes of diarrhea today. She endorses associated nausea but no vomiting. Although she finished her course of antibiotics, she continues to have dysuria. She also reports upper abdominal pain associated with diarrhea. She began noticing vaginal discharge last night. She states she has had similar symptoms with yeast infection in the past. She is sexually active with one partner. No fevers or cough/cold symptoms.  Patient is a 30 y.o. female presenting with diarrhea. The history is provided by the patient.  Diarrhea   Past Medical History  Diagnosis Date  . Gastric reflux   . Anxiety   . Basedow's disease   . Pyelonephritis   . History of nephrolithiasis   . Anxiety disorder   . Bipolar disorder ([place])   . Interstitial cystitis   . Chronic back pain   . Fibroid   . Barrett esophagus   . ADD (attention deficit disorder)   . Back pain    Past Surgical History  Procedure Laterality Date  . Tonsillectomy    . Adenoidectomy    . Tear duct probing    . Cholecystectomy N/A 01/05/2014    Procedure: LAPAROSCOPIC CHOLECYSTECTOMY WITH INTRAOPERATIVE CHOLANGIOGRAM;  Surgeon: Edward Jolly, MD;  Location: [facility];  Service: General;  Laterality: N/A;  . Esophagogastroduodenoscopy (egd) with propofol N/A 06/18/2014    Procedure:  ESOPHAGOGASTRODUODENOSCOPY (EGD) WITH PROPOFOL;  Surgeon: Wonda Horner, MD;  Location: [facility] ENDOSCOPY;  Service: Endoscopy;  Laterality: N/A;  . Wisdom tooth extraction     Family History  Problem Relation Age of Onset  . Diabetes    . Hypertension    . Lung cancer    . Bipolar disorder Sister    Social History  Substance Use Topics  . Smoking status: Current Every Day Smoker -- 0.50 packs/day for 8 years    Types: Cigarettes  . Smokeless tobacco: Never Used  . Alcohol Use: No   OB History    Gravida Para Term Preterm AB TAB SAB Ectopic Multiple Living   1 1 1       1      Review of Systems  Gastrointestinal: Positive for diarrhea.   10 Systems reviewed and are negative for acute change except as noted in the HPI.    Allergies  Review of patient's allergies indicates no known allergies.  Home Medications   Prior to Admission medications   Medication Sig Start Date End Date Taking? Authorizing Provider  HYDROcodone-acetaminophen (NORCO) 10-325 MG per tablet Take 1 tablet by mouth every 6 (six) hours as needed for moderate pain or severe pain (pain). 10/07/14   Rosalita Chessman Chase, DO  Norgestimate-Ethinyl Estradiol Triphasic (TRI-PREVIFEM) 0.18/0.215/0.25 MG-35 MCG tablet Take 1 tablet by mouth daily.    Historical Provider, MD   BP 102/76 mmHg  Pulse 61  Temp(Src) 98.2 F (36.8 C) (Oral)  Resp 18  Ht 5\' 6"  (1.676 m)  Wt 140 lb (63.504 kg)  BMI 22.61 kg/m2  SpO2 97%  LMP 10/14/2015 Physical Exam  Constitutional: She is oriented to person, place, and time. She appears well-developed and well-nourished. No distress.  HENT:  Head: Normocephalic and atraumatic.  Mouth/Throat: Oropharynx is clear and moist.  Moist mucous membranes  Eyes: Conjunctivae are normal. Pupils are equal, round, and reactive to light.  Neck: Neck supple.  Cardiovascular: Normal rate, regular rhythm and normal heart sounds.   No murmur heard. Pulmonary/Chest: Effort normal and breath sounds  normal.  Abdominal: Soft. Bowel sounds are normal. She exhibits no distension. There is tenderness (RUQ). There is no rebound and no guarding.  Musculoskeletal: She exhibits no edema.  Neurological: She is alert and oriented to person, place, and time.  Fluent speech  Skin: Skin is warm and dry.  Psychiatric: She has a normal mood and affect. Judgment normal.  Nursing note and vitals reviewed.   ED Course  Procedures (including critical care time) Labs Review Labs Reviewed  URINALYSIS, ROUTINE W REFLEX MICROSCOPIC (NOT AT [facility]) - Abnormal; Notable for the following:    Color, Urine AMBER (*)    APPearance CLOUDY (*)    Hgb urine dipstick MODERATE (*)    Bilirubin Urine SMALL (*)    Ketones, ur 15 (*)    All other components within normal limits  URINE MICROSCOPIC-ADD ON - Abnormal; Notable for the following:    Squamous Epithelial / LPF 6-30 (*)    Bacteria, UA MANY (*)    All other components within normal limits  COMPREHENSIVE METABOLIC PANEL - Abnormal; Notable for the following:    Alkaline Phosphatase 37 (*)    All other components within normal limits  WET PREP, GENITAL  PREGNANCY, URINE  LIPASE, BLOOD  CBC WITH DIFFERENTIAL/PLATELET  GC/CHLAMYDIA PROBE AMP ([place]) NOT AT [facility]    Imaging Review No results found. I have personally reviewed and evaluated these lab results as part of my medical decision-making.  Medications  ondansetron (ZOFRAN-ODT) disintegrating tablet 8 mg (8 mg Oral Given 10/27/15 1330)  oxyCODONE-acetaminophen (PERCOCET/ROXICET) 5-325 MG per tablet 1 tablet (1 tablet Oral Given 10/27/15 1338)     MDM   Final diagnoses:  Diarrhea, unspecified type  RUQ pain  Vaginal discharge   PT w/ 1 day of diarrhea Associated with upper abdominal pain as well as vaginal discharge in the setting of recent treatment for UTI. Patient was well-appearing with normal vital signs at presentation. She had right upper quadrant tenderness without rebound or  guarding. No lower abdominal tenderness. UA shows squamous cells and bacteria but leukocyte and nitrite negative. Because of upper abdominal tenderness, obtained above lab work which showed normal LFTs and lipase. Normal WBC count. Gave the patient Zofran and Percocet for her symptoms. Prior to pelvic exam and reassessment, pt requested to leave [place]. Patient signed out AMA.   Sharlett Iles, MD 10/27/15 [phone]

## 2015-10-27 NOTE — ED Notes (Signed)
C/o diarrhea, abd pain, vaginal d/c started last night-recent tx of kidney infection-completed abx yesterday-NAD-steady gait

## 2015-10-27 NOTE — ED Notes (Signed)
Dr Rex Kras aware that pt wants to leave d/t child care issues. Pt signed out AMA.

## 2015-11-13 MED FILL — TRI-PREVIFEM TABLET: 0.18/0.215/ | 28 days supply | Qty: 28 | Fill #10

## 2015-12-09 MED FILL — NORGESTIMATE-ETH ESTRADIOL: 0.18/0.215/ | 28 days supply | Qty: 28 | Fill #11

## 2015-12-21 ENCOUNTER — Encounter (HOSPITAL_BASED_OUTPATIENT_CLINIC_OR_DEPARTMENT_OTHER): Payer: Self-pay | Admitting: Emergency Medicine

## 2015-12-21 ENCOUNTER — Emergency Department (HOSPITAL_BASED_OUTPATIENT_CLINIC_OR_DEPARTMENT_OTHER)
Admission: EM | Admit: 2015-12-21 | Discharge: 2015-12-21 | Disposition: A | Discharge status: Home / Self Care | Payer: Self-pay | Attending: Emergency Medicine | Admitting: Emergency Medicine

## 2015-12-21 DIAGNOSIS — F17210 Nicotine dependence, cigarettes, uncomplicated: Secondary | ICD-10-CM | POA: Insufficient documentation

## 2015-12-21 DIAGNOSIS — F319 Bipolar disorder, unspecified: Secondary | ICD-10-CM | POA: Insufficient documentation

## 2015-12-21 DIAGNOSIS — L02413 Cutaneous abscess of right upper limb: Secondary | ICD-10-CM

## 2015-12-21 DIAGNOSIS — N12 Tubulo-interstitial nephritis, not specified as acute or chronic: Secondary | ICD-10-CM | POA: Insufficient documentation

## 2015-12-21 LAB — URINALYSIS, ROUTINE W REFLEX MICROSCOPIC
Glucose, UA: NEGATIVE mg/dL
KETONES UR: 15 mg/dL — AB
NITRITE: POSITIVE — AB
PH: 5 (ref 5.0–8.0)
PROTEIN: 30 mg/dL — AB
Specific Gravity, Urine: 1.016 (ref 1.005–1.030)

## 2015-12-21 LAB — CBC WITH DIFFERENTIAL/PLATELET
BASOS ABS: 0 10*3/uL (ref 0.0–0.1)
BASOS PCT: 0 %
Eosinophils Absolute: 0.8 10*3/uL — ABNORMAL HIGH (ref 0.0–0.7)
Eosinophils Relative: 7 %
HCT: 37.2 % (ref 36.0–46.0)
HEMOGLOBIN: 12.4 g/dL (ref 12.0–15.0)
LYMPHS ABS: 3.1 10*3/uL (ref 0.7–4.0)
Lymphocytes Relative: 26 %
MCH: 28.5 pg (ref 26.0–34.0)
MCHC: 33.3 g/dL (ref 30.0–36.0)
MCV: 85.5 fL (ref 78.0–100.0)
Monocytes Absolute: 1.1 10*3/uL — ABNORMAL HIGH (ref 0.1–1.0)
Monocytes Relative: 9 %
NEUTROS PCT: 58 %
Neutro Abs: 7 10*3/uL (ref 1.7–7.7)
Platelets: 172 10*3/uL (ref 150–400)
RBC: 4.35 MIL/uL (ref 3.87–5.11)
RDW: 13.1 % (ref 11.5–15.5)
WBC: 12.1 10*3/uL — AB (ref 4.0–10.5)

## 2015-12-21 LAB — URINE MICROSCOPIC-ADD ON

## 2015-12-21 LAB — COMPREHENSIVE METABOLIC PANEL
ALBUMIN: 4 g/dL (ref 3.5–5.0)
ALK PHOS: 43 U/L (ref 38–126)
ALT: 13 U/L — AB (ref 14–54)
AST: 14 U/L — AB (ref 15–41)
Anion gap: 8 (ref 5–15)
BUN: 11 mg/dL (ref 6–20)
CALCIUM: 9 mg/dL (ref 8.9–10.3)
CO2: 25 mmol/L (ref 22–32)
CREATININE: 0.75 mg/dL (ref 0.44–1.00)
Chloride: 105 mmol/L (ref 101–111)
GFR calc Af Amer: >60 mL/min (ref >60)
GFR calc non Af Amer: >60 mL/min (ref >60)
GLUCOSE: 96 mg/dL (ref 65–99)
Potassium: 3.9 mmol/L (ref 3.5–5.1)
SODIUM: 138 mmol/L (ref 135–145)
Total Bilirubin: 0.3 mg/dL (ref 0.3–1.2)
Total Protein: 6.9 g/dL (ref 6.5–8.1)

## 2015-12-21 LAB — PREGNANCY, URINE: Preg Test, Ur: NEGATIVE

## 2015-12-21 LAB — LIPASE, BLOOD: LIPASE: 42 U/L (ref 11–51)

## 2015-12-21 MED ORDER — SODIUM CHLORIDE 0.9 % IV BOLUS (SEPSIS)
1000.0000 mL | Freq: Once | INTRAVENOUS | Status: AC
  Administered 2015-12-21: 1000 mL via INTRAVENOUS

## 2015-12-21 MED ORDER — HYDROCODONE-ACETAMINOPHEN 5-325 MG PO TABS
1.0000 | ORAL_TABLET | Freq: Once | ORAL | Status: AC
  Administered 2015-12-21: 1 via ORAL
  Filled 2015-12-21: qty 1

## 2015-12-21 MED ORDER — CEPHALEXIN 500 MG PO CAPS: ORAL_CAPSULE | ORAL | Status: DC

## 2015-12-21 MED ORDER — OXYCODONE-ACETAMINOPHEN 5-325 MG PO TABS
2.0000 | ORAL_TABLET | Freq: Once | ORAL | Status: AC
Start: 2015-12-21 — End: 2015-12-21
  Administered 2015-12-21: 2 via ORAL
  Filled 2015-12-21: qty 2

## 2015-12-21 MED ORDER — PROMETHAZINE HCL 25 MG/ML IJ SOLN
12.5000 mg | Freq: Once | INTRAMUSCULAR | Status: AC
  Administered 2015-12-21: 12.5 mg via INTRAVENOUS
  Filled 2015-12-21: qty 1

## 2015-12-21 MED ORDER — ONDANSETRON HCL 4 MG/2ML IJ SOLN
4.0000 mg | Freq: Once | INTRAMUSCULAR | Status: AC
  Administered 2015-12-21: 4 mg via INTRAVENOUS
  Filled 2015-12-21: qty 2

## 2015-12-21 MED ORDER — HYDROCODONE-ACETAMINOPHEN 10-325 MG PO TABS: 1.0000 | ORAL_TABLET | Freq: Four times a day (QID) | ORAL | Status: DC | PRN

## 2015-12-21 MED ORDER — ACETAMINOPHEN 325 MG PO TABS
650.0000 mg | ORAL_TABLET | Freq: Once | ORAL | Status: AC
  Administered 2015-12-21: 650 mg via ORAL
  Filled 2015-12-21: qty 2

## 2015-12-21 MED ORDER — PROMETHAZINE HCL 25 MG PO TABS: 25.0000 mg | ORAL_TABLET | Freq: Four times a day (QID) | ORAL | Status: DC | PRN

## 2015-12-21 MED ORDER — DEXTROSE 5 % IV SOLN
1.0000 g | Freq: Once | INTRAVENOUS | Status: AC
  Administered 2015-12-21: 1 g via INTRAVENOUS
  Filled 2015-12-21: qty 10

## 2015-12-21 NOTE — ED Provider Notes (Signed)
CSN: [id number]     Arrival date & time 12/21/15  1412 History  By signing my name below, I, Hansel Feinstein, attest that this documentation has been prepared under the direction and in the presence of Merrily Pew, MD. Electronically Signed: Hansel Feinstein, ED Scribe. 12/21/2015. 3:14 PM.    Chief Complaint  Patient presents with  . Flank Pain   The history is provided by the patient. No language interpreter was used.    HPI Comments: [name] is a 30 y.o. female who presents to the Emergency Department complaining of moderate, bilateral flank pain with radiation to the lower back and lower abdomen onset 3 days ago with associated nausea, dysuria, frequency, urgency, subjective fever. She states she has tried Azo with no relief of symptoms. Pt states her symptoms feel similar to UTI, but more severe. She reports h/o similar symptoms with prior kidney infection as a teenager. No h/o renal calculi. She denies emesis, diarrhea, constipation, rash, hematuria, vaginal discharge or bleeding, leg swelling. Pt has h/o cholecystectomy. No h/o pancreatitis or liver disease. NKDA.   Past Medical History  Diagnosis Date  . Gastric reflux   . Anxiety   . Basedow's disease   . Pyelonephritis   . History of nephrolithiasis   . Anxiety disorder   . Bipolar disorder ([place])   . Interstitial cystitis   . Chronic back pain   . Fibroid   . Barrett esophagus   . ADD (attention deficit disorder)   . Back pain    Past Surgical History  Procedure Laterality Date  . Tonsillectomy    . Adenoidectomy    . Tear duct probing    . Cholecystectomy N/A 01/05/2014    Procedure: LAPAROSCOPIC CHOLECYSTECTOMY WITH INTRAOPERATIVE CHOLANGIOGRAM;  Surgeon: Edward Jolly, MD;  Location: [facility];  Service: General;  Laterality: N/A;  . Esophagogastroduodenoscopy (egd) with propofol N/A 06/18/2014    Procedure: ESOPHAGOGASTRODUODENOSCOPY (EGD) WITH PROPOFOL;  Surgeon: Wonda Horner, MD;  Location: [facility] ENDOSCOPY;   Service: Endoscopy;  Laterality: N/A;  . Wisdom tooth extraction     Family History  Problem Relation Age of Onset  . Diabetes    . Hypertension    . Lung cancer    . Bipolar disorder Sister    Social History  Substance Use Topics  . Smoking status: Current Every Day Smoker -- 0.50 packs/day for 8 years    Types: Cigarettes  . Smokeless tobacco: Never Used  . Alcohol Use: No   OB History    Gravida Para Term Preterm AB TAB SAB Ectopic Multiple Living   1 1 1       1      Review of Systems  Constitutional: Positive for fever.  Cardiovascular: Negative for leg swelling.  Gastrointestinal: Positive for nausea and abdominal pain. Negative for vomiting, diarrhea and constipation.  Genitourinary: Positive for dysuria, urgency, frequency and flank pain. Negative for hematuria, vaginal bleeding and vaginal discharge.  Musculoskeletal: Positive for back pain.  Skin: Negative for rash.  All other systems reviewed and are negative.   Allergies  Review of patient's allergies indicates no known allergies.  Home Medications   Prior to Admission medications   Medication Sig Start Date End Date Taking? Authorizing Provider  cephALEXin (KEFLEX) 500 MG capsule 2 caps po bid x 7 days 12/21/15   Merrily Pew, MD  HYDROcodone-acetaminophen (NORCO) 10-325 MG tablet Take 1 tablet by mouth every 6 (six) hours as needed for moderate pain or severe pain (pain). 12/21/15  Merrily Pew, MD  Norgestimate-Ethinyl Estradiol Triphasic (TRI-PREVIFEM) 0.18/0.215/0.25 MG-35 MCG tablet Take 1 tablet by mouth daily.    Historical Provider, MD  promethazine (PHENERGAN) 25 MG tablet Take 1 tablet (25 mg total) by mouth every 6 (six) hours as needed for nausea or vomiting. 12/21/15   Merrily Pew, MD   BP 119/83 mmHg  Pulse 87  Temp(Src) 98.4 F (36.9 C) (Oral)  Resp 16  Ht 5\' 6"  (1.676 m)  Wt 140 lb (63.504 kg)  BMI 22.61 kg/m2  SpO2 100%  LMP 12/14/2015 Physical Exam  Constitutional: She appears  well-developed and well-nourished.  HENT:  Head: Normocephalic.   Dry mucous membranes.  Eyes: Conjunctivae are normal.  Cardiovascular: Regular rhythm and intact distal pulses.  Tachycardia present.  Exam reveals no gallop and no friction rub.   No murmur heard. Normal DP pulses bilaterally. Tachycardic with regular rhythm.   Pulmonary/Chest: Effort normal. No respiratory distress. She has no wheezes. She has no rales.  Lungs CTA bilaterally.   Abdominal: Soft. She exhibits no distension. There is tenderness.  Bilateral CVA tenderness. No rashes appreciated to the abdomen.   Musculoskeletal: Normal range of motion. She exhibits no edema.  No lower extremity edema.  Neurological: She is alert.  Skin: Skin is warm and dry. No rash noted.  Psychiatric: She has a normal mood and affect. Her behavior is normal.  Nursing note and vitals reviewed.   ED Course  Procedures (including critical care time) DIAGNOSTIC STUDIES: Oxygen Saturation is 99% on RA, normal by my interpretation.    COORDINATION OF CARE: 3:13 PM Discussed treatment plan with pt at bedside which includes lab work and pt agreed to plan.   Labs Review Labs Reviewed  URINALYSIS, ROUTINE W REFLEX MICROSCOPIC (NOT AT [facility]) - Abnormal; Notable for the following:    Color, Urine RED (*)    APPearance TURBID (*)    Hgb urine dipstick TRACE (*)    Bilirubin Urine SMALL (*)    Ketones, ur 15 (*)    Protein, ur 30 (*)    Nitrite POSITIVE (*)    Leukocytes, UA LARGE (*)    All other components within normal limits  CBC WITH DIFFERENTIAL/PLATELET - Abnormal; Notable for the following:    WBC 12.1 (*)    Monocytes Absolute 1.1 (*)    Eosinophils Absolute 0.8 (*)    All other components within normal limits  COMPREHENSIVE METABOLIC PANEL - Abnormal; Notable for the following:    AST 14 (*)    ALT 13 (*)    All other components within normal limits  URINE MICROSCOPIC-ADD ON - Abnormal; Notable for the following:     Squamous Epithelial / LPF TOO NUMEROUS TO COUNT (*)    Bacteria, UA MANY (*)    All other components within normal limits  URINE CULTURE  PREGNANCY, URINE  LIPASE, BLOOD    Imaging Review No results found. I have personally reviewed and evaluated these images and lab results as part of my medical decision-making.   EKG Interpretation None      MDM   Final diagnoses:  Pyelonephritis    30 year old female here with pyelonephritis. Initially some tachycardia and significant CVA tenderness however after defervesced since and antibiotics and fluids and pain medicine patient's symptoms improved significantly. She still felt "like crap" however appeared much improved. Plan will be for discharge home on antibiotics with nausea medicine. She'll follow with her primary doctor to ensure she has improving urinary tract infection otherwise return  here for any worsening symptoms.  New Prescriptions: Discharge Medication List as of 12/21/2015  7:21 PM    START taking these medications   Details  cephALEXin (KEFLEX) 500 MG capsule 2 caps po bid x 7 days, Print    promethazine (PHENERGAN) 25 MG tablet Take 1 tablet (25 mg total) by mouth every 6 (six) hours as needed for nausea or vomiting., Starting 12/21/2015, Until Discontinued, Print         I have personally and contemperaneously reviewed labs and imaging and used in my decision making as above.   A medical screening exam was performed and I feel the patient has had an appropriate workup for their chief complaint at this time and likelihood of emergent condition existing is low and thus workup can continue on an outpatient basis.. Their vital signs are stable. They have been counseled on decision, discharge, follow up and which symptoms necessitate immediate return to the emergency department.  They verbally stated understanding and agreement with plan and discharged in stable condition.    I personally performed the services described in  this documentation, which was scribed in my presence. The recorded information has been reviewed and is accurate.    Merrily Pew, MD 12/21/15 2255

## 2015-12-21 NOTE — ED Notes (Signed)
Pt alert, NAD, calm, interactive, resps e/u, "ready to go", family at [facility], updated.

## 2015-12-21 NOTE — ED Notes (Signed)
Pt in with urinary frequency x several days, now flank pain and generally not feeling well. Ambulatory in NAD.

## 2015-12-21 NOTE — ED Notes (Signed)
Pt is very tearful and upset. Pt reports increased urinary frequency and burning 3 days ago. Pt now reporting bilat flank pain radiating into her abd. States she is nauseated, denies vomiting.

## 2015-12-21 NOTE — ED Notes (Signed)
Dr. Dayna Barker in to see pt for team d/c, denies questions or needs, VSS, given Rx x3, steady gait. Alert, NAD, calm.

## 2015-12-22 LAB — URINE CULTURE

## 2015-12-27 ENCOUNTER — Emergency Department (HOSPITAL_COMMUNITY)
Admission: EM | Admit: 2015-12-27 | Discharge: 2015-12-27 | Disposition: A | Discharge status: Home / Self Care | Payer: Self-pay | Attending: Emergency Medicine | Admitting: Emergency Medicine

## 2015-12-27 ENCOUNTER — Encounter (HOSPITAL_COMMUNITY): Payer: Self-pay | Admitting: Emergency Medicine

## 2015-12-27 DIAGNOSIS — F17210 Nicotine dependence, cigarettes, uncomplicated: Secondary | ICD-10-CM | POA: Insufficient documentation

## 2015-12-27 DIAGNOSIS — Z79891 Long term (current) use of opiate analgesic: Secondary | ICD-10-CM | POA: Insufficient documentation

## 2015-12-27 DIAGNOSIS — Z79899 Other long term (current) drug therapy: Secondary | ICD-10-CM | POA: Insufficient documentation

## 2015-12-27 DIAGNOSIS — F319 Bipolar disorder, unspecified: Secondary | ICD-10-CM | POA: Insufficient documentation

## 2015-12-27 DIAGNOSIS — N390 Urinary tract infection, site not specified: Secondary | ICD-10-CM | POA: Insufficient documentation

## 2015-12-27 LAB — URINALYSIS, ROUTINE W REFLEX MICROSCOPIC
BILIRUBIN URINE: NEGATIVE
Glucose, UA: NEGATIVE mg/dL
Ketones, ur: NEGATIVE mg/dL
NITRITE: NEGATIVE
PH: 6 (ref 5.0–8.0)
Protein, ur: NEGATIVE mg/dL
SPECIFIC GRAVITY, URINE: 1.01 (ref 1.005–1.030)

## 2015-12-27 LAB — PREGNANCY, URINE: Preg Test, Ur: NEGATIVE

## 2015-12-27 LAB — URINE MICROSCOPIC-ADD ON

## 2015-12-27 MED ORDER — KETOROLAC TROMETHAMINE 60 MG/2ML IM SOLN
60.0000 mg | Freq: Once | INTRAMUSCULAR | Status: AC
  Administered 2015-12-27: 60 mg via INTRAMUSCULAR
  Filled 2015-12-27: qty 2

## 2015-12-27 MED ORDER — SULFAMETHOXAZOLE-TRIMETHOPRIM 800-160 MG PO TABS: 1.0000 | ORAL_TABLET | Freq: Two times a day (BID) | ORAL | Status: AC

## 2015-12-27 NOTE — ED Notes (Signed)
Patient verbalizes understanding of discharge instructions, prescriptions, home care and follow up care. Patient out of department at this time. 

## 2015-12-27 NOTE — ED Notes (Signed)
Patient seen at an Urgent Care 5-6 days ago and diagnosed with UTI. Patient states given Keflex, phenerghan, and hydrocodone. Per patient no relief despite taking medication. Patient reports painful and frequent urination with mid back pain. Denies any fevers.

## 2015-12-27 NOTE — Discharge Instructions (Signed)

## 2015-12-27 NOTE — ED Provider Notes (Signed)
CSN: [id number]     Arrival date & time 12/27/15  1115 History   First MD Initiated Contact with Patient 12/27/15 1157     Chief Complaint  Patient presents with  . Dysuria     (Consider location/radiation/quality/duration/timing/severity/associated sxs/prior Treatment) The history is provided by the patient.   [name] is a 30 y.o. female with past medical history as outlined below presenting with persistent dysuria and now with pain radiating into her left flank.  She denies fevers, chills, vomiting but has had mild nausea. She was seen here 6 days ago and currently on day 6 of keflex with no improvement.  She has taken phenergan which has helped with nausea.  She denies history of kidney stones And her pain initially started in the suprapubic region with pressure and burning pain with urination.  She denies flank pain except for the past 2 days.  She has found no alleviators.    Past Medical History  Diagnosis Date  . Gastric reflux   . Anxiety   . Basedow's disease   . Pyelonephritis   . History of nephrolithiasis   . Anxiety disorder   . Bipolar disorder ([place])   . Interstitial cystitis   . Chronic back pain   . Fibroid   . Barrett esophagus   . ADD (attention deficit disorder)   . Back pain    Past Surgical History  Procedure Laterality Date  . Tonsillectomy    . Adenoidectomy    . Tear duct probing    . Cholecystectomy N/A 01/05/2014    Procedure: LAPAROSCOPIC CHOLECYSTECTOMY WITH INTRAOPERATIVE CHOLANGIOGRAM;  Surgeon: Edward Jolly, MD;  Location: [facility];  Service: General;  Laterality: N/A;  . Esophagogastroduodenoscopy (egd) with propofol N/A 06/18/2014    Procedure: ESOPHAGOGASTRODUODENOSCOPY (EGD) WITH PROPOFOL;  Surgeon: Wonda Horner, MD;  Location: [facility] ENDOSCOPY;  Service: Endoscopy;  Laterality: N/A;  . Wisdom tooth extraction     Family History  Problem Relation Age of Onset  . Diabetes    . Hypertension    . Lung cancer    . Bipolar  disorder Sister    Social History  Substance Use Topics  . Smoking status: Current Every Day Smoker -- 0.50 packs/day for 8 years    Types: Cigarettes  . Smokeless tobacco: Never Used  . Alcohol Use: No   OB History    Gravida Para Term Preterm AB TAB SAB Ectopic Multiple Living   1 1 1       1      Review of Systems  Constitutional: Negative for fever and chills.  HENT: Negative for congestion and sore throat.   Eyes: Negative.   Respiratory: Negative for chest tightness and shortness of breath.   Cardiovascular: Negative for chest pain.  Gastrointestinal: Positive for nausea. Negative for abdominal pain.  Genitourinary: Positive for dysuria, urgency and frequency. Negative for hematuria and vaginal discharge.  Musculoskeletal: Negative for joint swelling, arthralgias and neck pain.  Skin: Negative.  Negative for rash and wound.  Neurological: Negative for dizziness, weakness, light-headedness, numbness and headaches.  Psychiatric/Behavioral: Negative.       Allergies  Review of patient's allergies indicates no known allergies.  Home Medications   Prior to Admission medications   Medication Sig Start Date End Date Taking? Authorizing Provider  cephALEXin (KEFLEX) 500 MG capsule 2 caps po bid x 7 days 12/21/15  Yes Merrily Pew, MD  HYDROcodone-acetaminophen (NORCO) 10-325 MG tablet Take 1 tablet by mouth every 6 (six) hours  as needed for moderate pain or severe pain (pain). 12/21/15  Yes Merrily Pew, MD  Norgestimate-Ethinyl Estradiol Triphasic (TRI-PREVIFEM) 0.18/0.215/0.25 MG-35 MCG tablet Take 1 tablet by mouth daily.   Yes Historical Provider, MD  promethazine (PHENERGAN) 25 MG tablet Take 1 tablet (25 mg total) by mouth every 6 (six) hours as needed for nausea or vomiting. 12/21/15  Yes Merrily Pew, MD  sulfamethoxazole-trimethoprim (BACTRIM DS,SEPTRA DS) 800-160 MG tablet Take 1 tablet by mouth 2 (two) times daily. 12/27/15 01/03/16  Evalee Jefferson, PA-C   BP 122/75 mmHg   Pulse 100  Temp(Src) 98.1 F (36.7 C) (Oral)  Resp 18  Ht 5\' 6"  (1.676 m)  Wt 58.968 kg  BMI 20.99 kg/m2  LMP 12/14/2015 Physical Exam  Constitutional: She appears well-developed and well-nourished.  HENT:  Head: Normocephalic and atraumatic.  Eyes: Conjunctivae are normal.  Neck: Normal range of motion.  Cardiovascular: Normal rate, regular rhythm, normal heart sounds and intact distal pulses.   Pulmonary/Chest: Effort normal and breath sounds normal. She has no wheezes.  Abdominal: Soft. Bowel sounds are normal. There is no tenderness.  Tender along left lower lumbar back, no midline pain, no cva ttp.  Musculoskeletal: Normal range of motion.  Neurological: She is alert.  Skin: Skin is warm and dry.  Psychiatric: She has a normal mood and affect.  Nursing note and vitals reviewed.   ED Course  Procedures (including critical care time) Labs Review Labs Reviewed  URINALYSIS, ROUTINE W REFLEX MICROSCOPIC (NOT AT [facility]) - Abnormal; Notable for the following:    APPearance HAZY (*)    Hgb urine dipstick MODERATE (*)    Leukocytes, UA TRACE (*)    All other components within normal limits  URINE MICROSCOPIC-ADD ON - Abnormal; Notable for the following:    Squamous Epithelial / LPF 6-30 (*)    Bacteria, UA MANY (*)    All other components within normal limits  URINE CULTURE  PREGNANCY, URINE    Imaging Review No results found. I have personally reviewed and evaluated these images and lab results as part of my medical decision-making.   EKG Interpretation None      MDM   Final diagnoses:  UTI (lower urinary tract infection)    UTI currently on day 6 of Keflex with no significant improvement in symptoms.  Urine culture from her last visit was reviewed, unfortunately there was polymicrobial growth so culture was not helpful.  Repeat culture was ordered today.  She will be placed on Bactrim twice a day for 14 days as I suspect she is trying to develop pyelonephritis  although without classic symptoms of vomiting and fever.  She was advised that she needs to return here for development of the symptoms if she worsens.  Patient understands the plan.  Advised increase fluid intake.    Evalee Jefferson, PA-C 12/27/15 [place], DO 12/28/15 [phone]

## 2015-12-30 LAB — URINE CULTURE
Culture: 1000 — AB
Special Requests: NORMAL

## 2016-01-07 MED FILL — NORG-EE 0.18-0.215-0.25/0.0: 0.18/0.215/ | 28 days supply | Qty: 28 | Fill #12

## 2016-02-04 MED FILL — NORG-EE 0.18-0.215-0.25/0.0: 0.18/0.215/ | 28 days supply | Qty: 28 | Fill #13

## 2016-02-16 ENCOUNTER — Encounter (HOSPITAL_BASED_OUTPATIENT_CLINIC_OR_DEPARTMENT_OTHER): Payer: Self-pay | Admitting: Emergency Medicine

## 2016-02-16 ENCOUNTER — Emergency Department (HOSPITAL_BASED_OUTPATIENT_CLINIC_OR_DEPARTMENT_OTHER)
Admission: EM | Admit: 2016-02-16 | Discharge: 2016-02-16 | Disposition: A | Discharge status: Home / Self Care | Payer: Self-pay | Attending: Emergency Medicine | Admitting: Emergency Medicine

## 2016-02-16 DIAGNOSIS — G43909 Migraine, unspecified, not intractable, without status migrainosus: Secondary | ICD-10-CM | POA: Insufficient documentation

## 2016-02-16 DIAGNOSIS — F17210 Nicotine dependence, cigarettes, uncomplicated: Secondary | ICD-10-CM | POA: Insufficient documentation

## 2016-02-16 DIAGNOSIS — H53149 Visual discomfort, unspecified: Secondary | ICD-10-CM | POA: Insufficient documentation

## 2016-02-16 DIAGNOSIS — R110 Nausea: Secondary | ICD-10-CM | POA: Insufficient documentation

## 2016-02-16 DIAGNOSIS — F909 Attention-deficit hyperactivity disorder, unspecified type: Secondary | ICD-10-CM | POA: Insufficient documentation

## 2016-02-16 HISTORY — DX: Migraine, unspecified, not intractable, without status migrainosus: G43.909

## 2016-02-16 LAB — CBC WITH DIFFERENTIAL/PLATELET
BASOS ABS: 0 10*3/uL (ref 0.0–0.1)
BASOS PCT: 0 %
EOS ABS: 0.9 10*3/uL — AB (ref 0.0–0.7)
EOS PCT: 9 %
HCT: 39.8 % (ref 36.0–46.0)
Hemoglobin: 13.1 g/dL (ref 12.0–15.0)
Lymphocytes Relative: 21 %
Lymphs Abs: 2 10*3/uL (ref 0.7–4.0)
MCH: 28.6 pg (ref 26.0–34.0)
MCHC: 32.9 g/dL (ref 30.0–36.0)
MCV: 86.9 fL (ref 78.0–100.0)
Monocytes Absolute: 0.7 10*3/uL (ref 0.1–1.0)
Monocytes Relative: 8 %
Neutro Abs: 5.9 10*3/uL (ref 1.7–7.7)
Neutrophils Relative %: 62 %
PLATELETS: 191 10*3/uL (ref 150–400)
RBC: 4.58 MIL/uL (ref 3.87–5.11)
RDW: 13.9 % (ref 11.5–15.5)
WBC: 9.5 10*3/uL (ref 4.0–10.5)

## 2016-02-16 LAB — URINALYSIS, ROUTINE W REFLEX MICROSCOPIC
BILIRUBIN URINE: NEGATIVE
Glucose, UA: NEGATIVE mg/dL
Ketones, ur: NEGATIVE mg/dL
NITRITE: NEGATIVE
PROTEIN: NEGATIVE mg/dL
Specific Gravity, Urine: 1.017 (ref 1.005–1.030)
pH: 7 (ref 5.0–8.0)

## 2016-02-16 LAB — URINE MICROSCOPIC-ADD ON

## 2016-02-16 LAB — BASIC METABOLIC PANEL
Anion gap: 6 (ref 5–15)
BUN: 7 mg/dL (ref 6–20)
CALCIUM: 8.9 mg/dL (ref 8.9–10.3)
CO2: 27 mmol/L (ref 22–32)
CREATININE: 0.74 mg/dL (ref 0.44–1.00)
Chloride: 104 mmol/L (ref 101–111)
GFR calc Af Amer: >60 mL/min (ref >60)
Glucose, Bld: 100 mg/dL — ABNORMAL HIGH (ref 65–99)
Potassium: 3.9 mmol/L (ref 3.5–5.1)
SODIUM: 137 mmol/L (ref 135–145)

## 2016-02-16 LAB — PREGNANCY, URINE: PREG TEST UR: NEGATIVE

## 2016-02-16 MED ORDER — KETOROLAC TROMETHAMINE 30 MG/ML IJ SOLN
30.0000 mg | Freq: Once | INTRAMUSCULAR | Status: AC
  Administered 2016-02-16: 30 mg via INTRAVENOUS
  Filled 2016-02-16: qty 1

## 2016-02-16 MED ORDER — PROCHLORPERAZINE EDISYLATE 5 MG/ML IJ SOLN
10.0000 mg | Freq: Once | INTRAMUSCULAR | Status: AC
  Administered 2016-02-16: 10 mg via INTRAVENOUS
  Filled 2016-02-16: qty 2

## 2016-02-16 MED ORDER — METOCLOPRAMIDE HCL 5 MG/ML IJ SOLN
10.0000 mg | Freq: Once | INTRAMUSCULAR | Status: AC
  Administered 2016-02-16: 10 mg via INTRAVENOUS
  Filled 2016-02-16: qty 2

## 2016-02-16 MED ORDER — BUTALBITAL-APAP-CAFFEINE 50-325-40 MG PO TABS: 1.0000 | ORAL_TABLET | Freq: Four times a day (QID) | ORAL | 0 refills | Status: DC | PRN

## 2016-02-16 MED ORDER — SODIUM CHLORIDE 0.9 % IV BOLUS (SEPSIS)
1000.0000 mL | Freq: Once | INTRAVENOUS | Status: AC
  Administered 2016-02-16: 1000 mL via INTRAVENOUS

## 2016-02-16 MED ORDER — ONDANSETRON 4 MG PO TBDP: 4.0000 mg | ORAL_TABLET | Freq: Three times a day (TID) | ORAL | 0 refills | Status: DC | PRN

## 2016-02-16 MED ORDER — NAPROXEN 500 MG PO TABS: 500.0000 mg | ORAL_TABLET | Freq: Two times a day (BID) | ORAL | 0 refills | Status: DC

## 2016-02-16 NOTE — ED Triage Notes (Signed)
Patient reports migraine x 3 days.  Reports history of the same.  Reports nausea.  States she took excedrine migraine without relief yesterday.  Denies fevers.  Reports pain to occipital lobe and pain with movement of the neck.

## 2016-02-16 NOTE — ED Provider Notes (Signed)
[facility] DEPT Provider Note   CSN: [id number] Arrival date & time: 02/16/16  1047     History   Chief Complaint Chief Complaint  Patient presents with  . Migraine    HPI [name] is a 30 y.o. female.  HPI   [name] is a 30 y.o. female, with a history of migraine and anxiety, presenting to the ED with a headache intermittent for the past three days. Consistent with previous migraines. Pain is moderate to severe, frontal bilaterally, throbbing, radiating to the back of the head. Has tried imitrex and excedrin migraine without improvement. Endorses nausea and photophobia. Also endorses increased stress over the past week.  Denies fever/chills, vomiting, rashes, neck stiffness, trauma, or any other complaints.  LMP one week ago.  Past Medical History:  Diagnosis Date  . ADD (attention deficit disorder)   . Anxiety   . Anxiety disorder   . Back pain   . Barrett esophagus   . Basedow's disease   . Bipolar disorder ([place])   . Chronic back pain   . Fibroid   . Gastric reflux   . History of nephrolithiasis   . Interstitial cystitis   . Migraine   . Pyelonephritis     Patient Active Problem List   Diagnosis Date Noted  . Liver lesion 09/15/2015  . Dysuria 12/10/2014  . Cellulitis 10/02/2014  . Abdominal pain 06/17/2014  . Tobacco abuse 06/17/2014  . Vomiting 06/16/2014  . Back pain 05/21/2014  . Neck pain 05/21/2014  . Thoracic back pain 05/21/2014  . ADD (attention deficit disorder) 04/01/2014  . Eye pressure 03/14/2014  . Blurred vision, left eye 03/14/2014  . UTI (urinary tract infection) 02/28/2014  . Gallstones 01/05/2014  . Cholecystitis 01/05/2014  . Biliary dyskinesia 01/01/2014  . SVD (spontaneous vaginal delivery) 08/19/2013  . Indication for care in labor or delivery 08/18/2013  . Tick bite 02/28/2012  . Abscess 02/28/2012  . Boil 10/26/2011  . Bipolar 1 disorder ([place]) 10/15/2011  . Head injury 09/28/2010  . CRUSHING INJURY  OF HAND 02/10/2010  . EYE PAIN, LEFT 12/04/2009  . MOOD SWINGS 11/28/2009  . ARTHRITIS, BACK 10/02/2009  . ANXIETY STATE, UNSPECIFIED 09/23/2009  . KNEE PAIN, RIGHT 02/21/2009  . ANKLE PAIN, RIGHT 02/21/2009  . NAUSEA AND VOMITING 10/16/2008  . NEPHROLITHIASIS, HX OF 05/28/2008  . GASTROINTESTINAL XRAY, ABNORMAL 04/04/2008  . SYMPTOM, PAIN, ABDOMINAL, RIGHT UP QUADRANT 03/08/2007  . FLANK PAIN, RIGHT 03/08/2007  . BASEDOW'S DISEASE 03/06/2007  . PYELONEPHRITIS NOS 02/17/2007  . FATIGUE 02/17/2007  . OTITIS EXTERNA, ACUTE, LEFT 12/06/2006  . GERD 12/06/2006  . BACK PAIN 12/06/2006    Past Surgical History:  Procedure Laterality Date  . ADENOIDECTOMY    . CHOLECYSTECTOMY N/A 01/05/2014   Procedure: LAPAROSCOPIC CHOLECYSTECTOMY WITH INTRAOPERATIVE CHOLANGIOGRAM;  Surgeon: Edward Jolly, MD;  Location: [facility]S;  Service: General;  Laterality: N/A;  . ESOPHAGOGASTRODUODENOSCOPY (EGD) WITH PROPOFOL N/A 06/18/2014   Procedure: ESOPHAGOGASTRODUODENOSCOPY (EGD) WITH PROPOFOL;  Surgeon: Wonda Horner, MD;  Location: [facility] ENDOSCOPY;  Service: Endoscopy;  Laterality: N/A;  . TEAR DUCT PROBING    . TONSILLECTOMY    . WISDOM TOOTH EXTRACTION      OB History    Gravida Para Term Preterm AB Living   1 1 1     1    SAB TAB Ectopic Multiple Live Births           1       Home Medications  Prior to Admission medications   Medication Sig Start Date End Date Taking? Authorizing Provider  butalbital-acetaminophen-caffeine (FIORICET) 50-325-40 MG tablet Take 1-2 tablets by mouth every 6 (six) hours as needed for headache. 02/16/16 02/15/17  Zhaniya Swallows C Cas Tracz, PA-C  cephALEXin (KEFLEX) 500 MG capsule 2 caps po bid x 7 days 12/21/15   Merrily Pew, MD  HYDROcodone-acetaminophen (NORCO) 10-325 MG tablet Take 1 tablet by mouth every 6 (six) hours as needed for moderate pain or severe pain (pain). 12/21/15   Merrily Pew, MD  naproxen (NAPROSYN) 500 MG tablet Take 1 tablet (500 mg total) by mouth 2 (two) times  daily. 02/16/16   Aamirah Salmi C Samika Vetsch, PA-C  Norgestimate-Ethinyl Estradiol Triphasic (TRI-PREVIFEM) 0.18/0.215/0.25 MG-35 MCG tablet Take 1 tablet by mouth daily.    Historical Provider, MD  ondansetron (ZOFRAN ODT) 4 MG disintegrating tablet Take 1 tablet (4 mg total) by mouth every 8 (eight) hours as needed for nausea or vomiting. 02/16/16   Darroll Bredeson C Malena Timpone, PA-C  promethazine (PHENERGAN) 25 MG tablet Take 1 tablet (25 mg total) by mouth every 6 (six) hours as needed for nausea or vomiting. 12/21/15   Merrily Pew, MD    Family History Family History  Problem Relation Age of Onset  . Diabetes    . Hypertension    . Lung cancer    . Bipolar disorder Sister     Social History Social History  Substance Use Topics  . Smoking status: Current Every Day Smoker    Packs/day: 0.50    Years: 8.00    Types: Cigarettes  . Smokeless tobacco: Never Used  . Alcohol use No     Allergies   Review of patient's allergies indicates no known allergies.   Review of Systems Review of Systems  Constitutional: Negative for chills and fever.  Eyes: Positive for photophobia. Negative for visual disturbance.  Gastrointestinal: Positive for nausea. Negative for vomiting.  Skin: Negative for rash.  Neurological: Positive for headaches. Negative for dizziness, weakness and light-headedness.  All other systems reviewed and are negative.    Physical Exam Updated Vital Signs BP 107/82   Pulse 64   Temp 98 F (36.7 C) (Oral)   Resp 18   Ht 5\' 5"  (1.651 m)   Wt 63.5 kg   LMP 02/09/2016 (Approximate)   SpO2 100%   BMI 23.30 kg/m   Physical Exam  Constitutional: She is oriented to person, place, and time. She appears well-developed and well-nourished. No distress.  HENT:  Head: Normocephalic and atraumatic.  Eyes: Conjunctivae and EOM are normal. Pupils are equal, round, and reactive to light.  Neck: Normal range of motion. Neck supple.  Cardiovascular: Normal rate, regular rhythm, normal heart sounds and  intact distal pulses.   Pulmonary/Chest: Effort normal and breath sounds normal. No respiratory distress.  Abdominal: Soft. There is no tenderness. There is no guarding.  Musculoskeletal: She exhibits no edema or tenderness.  Full ROM in all extremities and spine. No midline spinal tenderness.   Lymphadenopathy:    She has no cervical adenopathy.  Neurological: She is alert and oriented to person, place, and time. She has normal reflexes.  No sensory deficits. Strength 5/5 in all extremities. No gait disturbance. Coordination intact. Cranial nerves III-XII grossly intact. No facial droop.   Skin: Skin is warm and dry. Capillary refill takes less than 2 seconds. She is not diaphoretic.  Psychiatric: She has a normal mood and affect. Her behavior is normal.  Nursing note and vitals reviewed.  ED Treatments / Results  Labs (all labs ordered are listed, but only abnormal results are displayed) Labs Reviewed  URINALYSIS, ROUTINE W REFLEX MICROSCOPIC (NOT AT [facility]) - Abnormal; Notable for the following:       Result Value   APPearance CLOUDY (*)    Hgb urine dipstick SMALL (*)    Leukocytes, UA MODERATE (*)    All other components within normal limits  BASIC METABOLIC PANEL - Abnormal; Notable for the following:    Glucose, Bld 100 (*)    All other components within normal limits  CBC WITH DIFFERENTIAL/PLATELET - Abnormal; Notable for the following:    Eosinophils Absolute 0.9 (*)    All other components within normal limits  URINE MICROSCOPIC-ADD ON - Abnormal; Notable for the following:    Squamous Epithelial / LPF 6-30 (*)    Bacteria, UA MANY (*)    All other components within normal limits  PREGNANCY, URINE    EKG  EKG Interpretation None       Radiology No results found.  Procedures Procedures (including critical care time)  Medications Ordered in ED Medications  ketorolac (TORADOL) 30 MG/ML injection 30 mg (30 mg Intravenous Given 02/16/16 1134)  sodium chloride  0.9 % bolus 1,000 mL (0 mLs Intravenous Stopped 02/16/16 1301)  metoCLOPramide (REGLAN) injection 10 mg (10 mg Intravenous Given 02/16/16 1134)  prochlorperazine (COMPAZINE) injection 10 mg (10 mg Intravenous Given 02/16/16 1134)     Initial Impression / Assessment and Plan / ED Course  I have reviewed the triage vital signs and the nursing notes.  Pertinent labs & imaging results that were available during my care of the patient were reviewed by me and considered in my medical decision making (see chart for details).  Clinical Course   Patient with migraine headache consistent with her previous migraines. No neuro or functional deficits. No red flag symptoms. Suspect dehydration and stress are contributing factors. Patient's headache resolved prior to discharge.   Vitals:   02/16/16 1054 02/16/16 1055 02/16/16 1305  BP: 107/82  103/59  Pulse: 64  66  Resp: 18  16  Temp: 98 F (36.7 C)    TempSrc: Oral    SpO2: 100%  100%  Weight:  63.5 kg   Height:  5\' 5"  (1.651 m)       Final Clinical Impressions(s) / ED Diagnoses   Final diagnoses:  Migraine without status migrainosus, not intractable, unspecified migraine type    New Prescriptions Discharge Medication List as of 02/16/2016 12:42 PM    START taking these medications   Details  butalbital-acetaminophen-caffeine (FIORICET) 50-325-40 MG tablet Take 1-2 tablets by mouth every 6 (six) hours as needed for headache., Starting Mon 02/16/2016, Until Tue 02/15/2017, Print    naproxen (NAPROSYN) 500 MG tablet Take 1 tablet (500 mg total) by mouth 2 (two) times daily., Starting Mon 02/16/2016, Print    ondansetron (ZOFRAN ODT) 4 MG disintegrating tablet Take 1 tablet (4 mg total) by mouth every 8 (eight) hours as needed for nausea or vomiting., Starting Mon 02/16/2016, Print         Lorayne Bender, PA-C 02/17/16 [place], PA-C 02/17/16 1042    Blanchie Dessert, MD 02/17/16 1539

## 2016-02-16 NOTE — Discharge Instructions (Signed)
There were no acute abnormalities on your labs. Continue to stay well-hydrated by drinking plenty of fluids. Use the naproxen for headaches, but if this does not work try the [employer]. Zofran for nausea. Follow-up with your PCP should symptoms continue. Return to the ED should symptoms worsen.

## 2016-02-17 MED FILL — NAPROXEN 500 MG TABLET: 500 | 15 days supply | Qty: 30 | Fill #0

## 2016-02-17 MED FILL — ONDANSETRON ODT 4 MG TABLET: 4 | 7 days supply | Qty: 20 | Fill #0

## 2016-02-17 MED FILL — BUTALB-ACETAMIN-CAFF 50-325: 50-325-40 | 2 days supply | Qty: 20 | Fill #0

## 2016-02-24 ENCOUNTER — Encounter (HOSPITAL_BASED_OUTPATIENT_CLINIC_OR_DEPARTMENT_OTHER): Payer: Self-pay

## 2016-02-24 NOTE — BH Assessment (Deleted)
Tele Assessment Note   [name] is an 30 y.o. female, Caucasian, Single who presents to [facility] as a walk-in for c/o SI and current cutting behaviors with plan to cut self with scissors. Patient mother was present with pt. during assessment. Per patient primary concern is current depression and SI with plan to cut self with scissors. Per mother, feels pt. Is unsafe at home and has already put away knives in house, but pt. found scissors to use. Patient sates has been sleeping not good but no specific amount pr night was mentioned. 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 Per mother, patient does not currently have or is prescribed pain medication. Patient resides with mother currently.  Patient acknowledges current SI with plan to cut self [patinet had current markings on wrist and stomach no apparent bleeding]. Patient denies hx. Of or current HI and AVH. Patient denies S.A. Patient denies having any inpatient psychiatric care before. Patient states she has just started seeing Dietrich Pates in [facility] as her therapist and that her primary care is with [facility].   Patient is dressed in normal attire and is alert and oriented x4. Patient speech was within normal limits and motor behavior appeared normal. Patient thought process is coherent. Patient  does not appear to be responding to internal stimuli. Patient was cooperative throughout the assessment and states that  she is agreeable to inpatient psychiatric treatment.   Diagnosis: Major Depression, Recurrent episode, Unspecified  Past Medical History:  Past Medical History:  Diagnosis Date  . ADD (attention deficit disorder)   . Anxiety   . Anxiety disorder   . Back pain   . Barrett esophagus   . Basedow's disease   . Bipolar disorder ([place])   . Chronic back pain   . Fibroid   . Gastric reflux   . History of nephrolithiasis   . Interstitial cystitis   . Migraine   . Pyelonephritis     Past  Surgical History:  Procedure Laterality Date  . ADENOIDECTOMY    . CHOLECYSTECTOMY N/A 01/05/2014   Procedure: LAPAROSCOPIC CHOLECYSTECTOMY WITH INTRAOPERATIVE CHOLANGIOGRAM;  Surgeon: Edward Jolly, MD;  Location: [facility] ORS;  Service: General;  Laterality: N/A;  . ESOPHAGOGASTRODUODENOSCOPY (EGD) WITH PROPOFOL N/A 06/18/2014   Procedure: ESOPHAGOGASTRODUODENOSCOPY (EGD) WITH PROPOFOL;  Surgeon: Wonda Horner, MD;  Location: [facility] ENDOSCOPY;  Service: Endoscopy;  Laterality: N/A;  . TEAR DUCT PROBING    . TONSILLECTOMY    . WISDOM TOOTH EXTRACTION      Family History:  Family History  Problem Relation Age of Onset  . Diabetes    . Hypertension    . Lung cancer    . Bipolar disorder Sister     Social History:  reports that she has been smoking Cigarettes.  She has a 4.00 pack-year smoking history. She has never used smokeless tobacco. She reports that she does not drink alcohol or use drugs.  Additional Social History:  Alcohol / Drug Use Pain Medications: SEE MAR Prescriptions: SEE MAR Over the Counter: SEE MAR History of alcohol / drug use?: No history of alcohol / drug abuse  CIWA: CIWA-Ar BP: 103/59 Pulse Rate: 66 COWS:    PATIENT STRENGTHS: (choose at least two) Active sense of humor Average or above average intelligence Capable of independent living Communication skills  Allergies: No Known Allergies  Home Medications:  (Not in a hospital admission)  OB/GYN Status:  Patient's last menstrual period was 02/09/2016 (approximate).  General Assessment Data Location of Assessment: [facility] Assessment Services TTS Assessment: In system Is this a Tele or Face-to-Face Assessment?: Face-to-Face Is this an Initial Assessment or a Re-assessment for this encounter?: Initial Assessment Marital status: Single Maiden name: n/a Is patient pregnant?: No Pregnancy Status: No Living Arrangements: Parent Can pt return to current living arrangement?: Yes Admission Status:  Voluntary Is patient capable of signing voluntary admission?: Yes Referral Source: Self/Family/Friend Insurance type: SP  Medical Screening Exam ([place]) Medical Exam completed: Yes  Crisis Care Plan Living Arrangements: Parent Name of Psychiatrist: Dietrich Pates Name of Therapist: Dietrich Pates  Education Status Is patient currently in school?: No Current Grade: n/a Highest grade of school patient has completed: GED Name of school: n/a Contact person: mother  Risk to self with the past 6 months Suicidal Ideation: Yes-Currently Present Has patient been a risk to self within the past 6 months prior to admission? : Yes Suicidal Intent: Yes-Currently Present Has patient had any suicidal intent within the past 6 months prior to admission? : Yes Is patient at risk for suicide?: Yes Suicidal Plan?: Yes-Currently Present Has patient had any suicidal plan within the past 6 months prior to admission? : No Specify Current Suicidal Plan: cut self with scissors Access to Means: Yes Specify Access to Suicidal Means: access to any sharp objects What has been your use of drugs/alcohol within the last 12 months?: none Previous Attempts/Gestures: Yes How many times?: 1 Other Self Harm Risks: cutting Triggers for Past Attempts: Unpredictable Intentional Self Injurious Behavior: Cutting Comment - Self Injurious Behavior: cutting current Family Suicide History: No Recent stressful life event(s): Turmoil (Comment) Persecutory voices/beliefs?: No Depression: Yes Depression Symptoms: Despondent, Insomnia, Tearfulness, Isolating, Fatigue, Guilt, Loss of interest in usual pleasures, Feeling worthless/self pity Substance abuse history and/or treatment for substance abuse?: No Suicide prevention information given to non-admitted patients: Yes  Risk to Others within the past 6 months Homicidal Ideation: No Does patient have any lifetime risk of violence toward others beyond the six months  prior to admission? : No Thoughts of Harm to Others: No Current Homicidal Intent: No Current Homicidal Plan: No Access to Homicidal Means: No Identified Victim: none History of harm to others?: No Assessment of Violence: None Noted Violent Behavior Description: none noted Does patient have access to weapons?: No Criminal Charges Pending?: No Does patient have a court date: No Is patient on probation?: No  Psychosis Hallucinations: None noted Delusions: None noted  Mental Status Report Appearance/Hygiene: Unremarkable Eye Contact: Fair Motor Activity: Unremarkable Speech: Logical/coherent Level of Consciousness: Alert Mood: Depressed, Anxious Affect: Angry, Anxious Anxiety Level: Moderate Thought Processes: Coherent, Relevant Judgement: Partial Orientation: Person, Place, Time, Situation, Appropriate for developmental age Obsessive Compulsive Thoughts/Behaviors: Minimal  Cognitive Functioning Concentration: Decreased Memory: Recent Intact, Remote Intact IQ: Average Insight: Poor Impulse Control: Poor Appetite: Poor Weight Loss: 0 Weight Gain: 0 Sleep: Decreased Total Hours of Sleep: 4 Vegetative Symptoms: None  ADLScreening [facility] Services) Patient's cognitive ability adequate to safely complete daily activities?: Yes Patient able to express need for assistance with ADLs?: Yes Independently performs ADLs?: Yes (appropriate for developmental age)  Prior Inpatient Therapy Prior Inpatient Therapy: No Prior Therapy Dates: n/a Prior Therapy Facilty/Provider(s): n/a Reason for Treatment: n/a  Prior Outpatient Therapy Prior Outpatient Therapy: Yes Prior Therapy Dates: current Prior Therapy Facilty/Provider(s): [place] Dietrich Pates  Reason for Treatment: depression, anxiety Does patient have an ACCT team?: No Does patient have Intensive In-House Services?  : No Does patient have  [facility] services? : No Does patient have P4CC services?: No  ADL  Screening (condition at time of admission) Patient's cognitive ability adequate to safely complete daily activities?: Yes Is the patient deaf or have difficulty hearing?: No Does the patient have difficulty seeing, even when wearing glasses/contacts?: No Does the patient have difficulty concentrating, remembering, or making decisions?: No Patient able to express need for assistance with ADLs?: Yes Does the patient have difficulty dressing or bathing?: No Independently performs ADLs?: Yes (appropriate for developmental age) Does the patient have difficulty walking or climbing stairs?: No (some dicciculty with Cloves Syndrome) Weakness of Legs: Both (occasional dues to Cloves Syndrome) Weakness of Arms/Hands: None  Home Assistive Devices/Equipment Home Assistive Devices/Equipment: None    Abuse/Neglect Assessment (Assessment to be complete while patient is alone) Physical Abuse: Denies Verbal Abuse: Denies Sexual Abuse: Denies Exploitation of patient/patient's resources: Denies Self-Neglect: Denies Values / Beliefs Cultural Requests During Hospitalization: None Spiritual Requests During Hospitalization: None   Advance Directives (For Healthcare) Does patient have an advance directive?: No (per mother has power of attorney for medical must locate documents) Would patient like information on creating an advanced directive?: No - patient declined information    Additional Information 1:1 In Past 12 Months?: No CIRT Risk: No Elopement Risk: No Does patient have medical clearance?: No (medica; screen yes, going [facility] for med clearance)     Disposition: Per Elmarie Shiley, NP patinet meets criteria for inpatient psychiatric care. Patient disposition pending medical clearance, pt to be transported to [facility] ED. Disposition Initial Assessment Completed for this Encounter: Yes Disposition of Patient: Inpatient treatment program Type of inpatient treatment program: Adult  Kristeen Mans 02/24/2016 5:43 PM

## 2016-03-05 MED FILL — NORG-EE 0.18-0.215-0.25/0.0: 0.18/0.215/ | 28 days supply | Qty: 28 | Fill #0

## 2016-04-01 MED FILL — NORG-EE 0.18-0.215-0.25/0.0: 0.18/0.215/ | 28 days supply | Qty: 28 | Fill #0

## 2016-04-26 ENCOUNTER — Emergency Department (HOSPITAL_BASED_OUTPATIENT_CLINIC_OR_DEPARTMENT_OTHER): Payer: Self-pay

## 2016-04-26 ENCOUNTER — Emergency Department (HOSPITAL_BASED_OUTPATIENT_CLINIC_OR_DEPARTMENT_OTHER)
Admission: EM | Admit: 2016-04-26 | Discharge: 2016-04-26 | Disposition: A | Discharge status: Home / Self Care | Payer: Self-pay | Attending: Emergency Medicine | Admitting: Emergency Medicine

## 2016-04-26 ENCOUNTER — Encounter (HOSPITAL_BASED_OUTPATIENT_CLINIC_OR_DEPARTMENT_OTHER): Payer: Self-pay | Admitting: Emergency Medicine

## 2016-04-26 DIAGNOSIS — R109 Unspecified abdominal pain: Secondary | ICD-10-CM | POA: Insufficient documentation

## 2016-04-26 DIAGNOSIS — F909 Attention-deficit hyperactivity disorder, unspecified type: Secondary | ICD-10-CM | POA: Insufficient documentation

## 2016-04-26 DIAGNOSIS — N12 Tubulo-interstitial nephritis, not specified as acute or chronic: Secondary | ICD-10-CM | POA: Insufficient documentation

## 2016-04-26 DIAGNOSIS — F17210 Nicotine dependence, cigarettes, uncomplicated: Secondary | ICD-10-CM | POA: Insufficient documentation

## 2016-04-26 LAB — CBC WITH DIFFERENTIAL/PLATELET
Basophils Absolute: 0.1 10*3/uL (ref 0.0–0.1)
Basophils Relative: 0 %
EOS ABS: 0.5 10*3/uL (ref 0.0–0.7)
Eosinophils Relative: 4 %
HEMATOCRIT: 42.7 % (ref 36.0–46.0)
HEMOGLOBIN: 14.1 g/dL (ref 12.0–15.0)
LYMPHS ABS: 2.6 10*3/uL (ref 0.7–4.0)
LYMPHS PCT: 23 %
MCH: 28 pg (ref 26.0–34.0)
MCHC: 33 g/dL (ref 30.0–36.0)
MCV: 84.9 fL (ref 78.0–100.0)
Monocytes Absolute: 0.8 10*3/uL (ref 0.1–1.0)
Monocytes Relative: 7 %
NEUTROS ABS: 7.3 10*3/uL (ref 1.7–7.7)
NEUTROS PCT: 66 %
Platelets: 249 10*3/uL (ref 150–400)
RBC: 5.03 MIL/uL (ref 3.87–5.11)
RDW: 13.9 % (ref 11.5–15.5)
WBC: 11.1 10*3/uL — AB (ref 4.0–10.5)

## 2016-04-26 LAB — URINALYSIS, ROUTINE W REFLEX MICROSCOPIC
GLUCOSE, UA: NEGATIVE mg/dL
KETONES UR: NEGATIVE mg/dL
NITRITE: NEGATIVE
PH: 5.5 (ref 5.0–8.0)
Protein, ur: NEGATIVE mg/dL
Specific Gravity, Urine: 1.023 (ref 1.005–1.030)

## 2016-04-26 LAB — BASIC METABOLIC PANEL
Anion gap: 6 (ref 5–15)
BUN: 6 mg/dL (ref 6–20)
CHLORIDE: 107 mmol/L (ref 101–111)
CO2: 25 mmol/L (ref 22–32)
Calcium: 9.2 mg/dL (ref 8.9–10.3)
Creatinine, Ser: 0.77 mg/dL (ref 0.44–1.00)
GFR calc Af Amer: >60 mL/min (ref >60)
GFR calc non Af Amer: >60 mL/min (ref >60)
Glucose, Bld: 96 mg/dL (ref 65–99)
POTASSIUM: 4.1 mmol/L (ref 3.5–5.1)
SODIUM: 138 mmol/L (ref 135–145)

## 2016-04-26 LAB — URINE MICROSCOPIC-ADD ON

## 2016-04-26 LAB — PREGNANCY, URINE: Preg Test, Ur: NEGATIVE

## 2016-04-26 MED ORDER — ONDANSETRON HCL 4 MG PO TABS: 4.0000 mg | ORAL_TABLET | Freq: Three times a day (TID) | ORAL | 0 refills | Status: DC | PRN

## 2016-04-26 MED ORDER — HYDROCODONE-ACETAMINOPHEN 5-325 MG PO TABS
1.0000 | ORAL_TABLET | Freq: Once | ORAL | Status: AC
  Administered 2016-04-26: 1 via ORAL
  Filled 2016-04-26: qty 1

## 2016-04-26 MED ORDER — DEXTROSE 5 % IV SOLN
1.0000 g | Freq: Once | INTRAVENOUS | Status: AC
  Administered 2016-04-26: 1 g via INTRAVENOUS
  Filled 2016-04-26: qty 10

## 2016-04-26 MED ORDER — NAPROXEN 375 MG PO TABS: 375.0000 mg | ORAL_TABLET | Freq: Two times a day (BID) | ORAL | 0 refills | Status: AC | PRN

## 2016-04-26 MED ORDER — ONDANSETRON HCL 4 MG/2ML IJ SOLN
4.0000 mg | Freq: Once | INTRAMUSCULAR | Status: AC
  Administered 2016-04-26: 4 mg via INTRAVENOUS
  Filled 2016-04-26: qty 2

## 2016-04-26 MED ORDER — SODIUM CHLORIDE 0.9 % IV BOLUS (SEPSIS)
1000.0000 mL | Freq: Once | INTRAVENOUS | Status: AC
  Administered 2016-04-26: 1000 mL via INTRAVENOUS

## 2016-04-26 MED ORDER — ONDANSETRON 4 MG PO TBDP
4.0000 mg | ORAL_TABLET | Freq: Once | ORAL | Status: AC
Start: 2016-04-26 — End: 2016-04-26
  Administered 2016-04-26: 4 mg via ORAL
  Filled 2016-04-26: qty 1

## 2016-04-26 MED ORDER — KETOROLAC TROMETHAMINE 15 MG/ML IJ SOLN
15.0000 mg | Freq: Once | INTRAMUSCULAR | Status: AC
  Administered 2016-04-26: 15 mg via INTRAVENOUS
  Filled 2016-04-26: qty 1

## 2016-04-26 MED ORDER — LEVOFLOXACIN 750 MG PO TABS: 750.0000 mg | ORAL_TABLET | Freq: Every day | ORAL | 0 refills | Status: AC

## 2016-04-26 MED ORDER — HYDROCODONE-ACETAMINOPHEN 5-325 MG PO TABS: 1.0000 | ORAL_TABLET | Freq: Four times a day (QID) | ORAL | 0 refills | Status: DC | PRN

## 2016-04-26 MED FILL — levoFLOXacin 750 MG TABS: 750 | 7 days supply | Qty: 7 | Fill #0

## 2016-04-26 MED FILL — ONDANSETRON HCL 4 MG TABLET: 4 | 6 days supply | Qty: 20 | Fill #0

## 2016-04-26 MED FILL — NAPROXEN 375 MG TABLET: 375 | 10 days supply | Qty: 20 | Fill #0

## 2016-04-26 MED FILL — HYDROCODON-APAP 5-325: 5-325 | 2 days supply | Qty: 10 | Fill #0

## 2016-04-26 NOTE — ED Provider Notes (Signed)
[facility] [facility] DEPT [facility] Provider Note   CSN: [id number] Arrival date & time: 04/26/16  [id number]     History   Chief Complaint Chief Complaint  Patient presents with  . Dysuria    HPI [name] is a 30 y.o. female.  HPI 30 yo F with PMHx as below here with right flank pain. Pt states that yesterday she developed acute onset of aching, gnawing, right flank pain. Pain is made worse with movement but relieved with temporary position changes. She has associated dysuria and urinary frequency. Pt has h/o recurrent UTI and pyelo with similar sx. No vomiting but has had some nausea. No known fevers. No diarrhea. She is tolerating PO. She took keflex last time but states levaquin "cleared it up." No h/o kidney stones, denies any hematurai.  Past Medical History:  Diagnosis Date  . ADD (attention deficit disorder)   . Anxiety   . Anxiety disorder   . Back pain   . Barrett esophagus   . Basedow's disease   . Bipolar disorder ([place])   . Chronic back pain   . Fibroid   . Gastric reflux   . History of nephrolithiasis   . Interstitial cystitis   . Migraine   . Pyelonephritis     Patient Active Problem List   Diagnosis Date Noted  . Liver lesion 09/15/2015  . Dysuria 12/10/2014  . Cellulitis 10/02/2014  . Abdominal pain 06/17/2014  . Tobacco abuse 06/17/2014  . Vomiting 06/16/2014  . Back pain 05/21/2014  . Neck pain 05/21/2014  . Thoracic back pain 05/21/2014  . ADD (attention deficit disorder) 04/01/2014  . Eye pressure 03/14/2014  . Blurred vision, left eye 03/14/2014  . UTI (urinary tract infection) 02/28/2014  . Gallstones 01/05/2014  . Cholecystitis 01/05/2014  . Biliary dyskinesia 01/01/2014  . SVD (spontaneous vaginal delivery) 08/19/2013  . Indication for care in labor or delivery 08/18/2013  . Tick bite 02/28/2012  . Abscess 02/28/2012  . Boil 10/26/2011  . Bipolar 1 disorder ([place]) 10/15/2011  . Head injury 09/28/2010  . CRUSHING INJURY OF HAND 02/10/2010   . EYE PAIN, LEFT 12/04/2009  . MOOD SWINGS 11/28/2009  . ARTHRITIS, BACK 10/02/2009  . ANXIETY STATE, UNSPECIFIED 09/23/2009  . KNEE PAIN, RIGHT 02/21/2009  . ANKLE PAIN, RIGHT 02/21/2009  . NAUSEA AND VOMITING 10/16/2008  . NEPHROLITHIASIS, HX OF 05/28/2008  . GASTROINTESTINAL XRAY, ABNORMAL 04/04/2008  . SYMPTOM, PAIN, ABDOMINAL, RIGHT UP QUADRANT 03/08/2007  . FLANK PAIN, RIGHT 03/08/2007  . BASEDOW'S DISEASE 03/06/2007  . PYELONEPHRITIS NOS 02/17/2007  . FATIGUE 02/17/2007  . OTITIS EXTERNA, ACUTE, LEFT 12/06/2006  . GERD 12/06/2006  . BACK PAIN 12/06/2006    Past Surgical History:  Procedure Laterality Date  . ADENOIDECTOMY    . CHOLECYSTECTOMY N/A 01/05/2014   Procedure: LAPAROSCOPIC CHOLECYSTECTOMY WITH INTRAOPERATIVE CHOLANGIOGRAM;  Surgeon: Edward Jolly, MD;  Location: [facility];  Service: General;  Laterality: N/A;  . ESOPHAGOGASTRODUODENOSCOPY (EGD) WITH PROPOFOL N/A 06/18/2014   Procedure: ESOPHAGOGASTRODUODENOSCOPY (EGD) WITH PROPOFOL;  Surgeon: Wonda Horner, MD;  Location: [facility] END[facility]COPY;  Service: Endoscopy;  Laterality: N/A;  . TEAR DUCT PROBING    . TONSILLECTOMY    . WISDOM TOOTH EXTRACTION      OB History    Gravida Para Term Preterm AB Living   1 1 1     1    SAB TAB Ectopic Multiple Live Births           1       Home  Medications    Prior to Admission medications   Medication Sig Start Date End Date Taking? Authorizing Provider  butalbital-acetaminophen-caffeine (FIORICET) 50-325-40 MG tablet Take 1-2 tablets by mouth every 6 (six) hours as needed for headache. 02/16/16 02/15/17  Shawn C Joy, PA-C  HYDROcodone-acetaminophen (NORCO) 10-325 MG tablet Take 1 tablet by mouth every 6 (six) hours as needed for moderate pain or severe pain (pain). 12/21/15   Merrily Pew, MD  HYDROcodone-acetaminophen (NORCO/VICODIN) 5-325 MG tablet Take 1-2 tablets by mouth every 6 (six) hours as needed for severe pain. 04/26/16   Duffy Bruce, MD  levofloxacin (LEVAQUIN) 750  MG tablet Take 1 tablet (750 mg total) by mouth daily. 04/26/16 05/03/16  Duffy Bruce, MD  naproxen (NAPROSYN) 375 MG tablet Take 1 tablet (375 mg total) by mouth 2 (two) times daily as needed for moderate pain. 04/26/16 05/03/16  Duffy Bruce, MD  Norgestimate-Ethinyl Estradiol Triphasic (TRI-PREVIFEM) 0.18/0.215/0.25 MG-35 MCG tablet Take 1 tablet by mouth daily.    Historical Provider, MD  ondansetron (ZOFRAN ODT) 4 MG disintegrating tablet Take 1 tablet (4 mg total) by mouth every 8 (eight) hours as needed for nausea or vomiting. 02/16/16   Shawn C Joy, PA-C  ondansetron (ZOFRAN) 4 MG tablet Take 1 tablet (4 mg total) by mouth every 8 (eight) hours as needed for nausea or vomiting. 04/26/16   Duffy Bruce, MD    Family History Family History  Problem Relation Age of Onset  . Diabetes    . Hypertension    . Lung cancer    . Bipolar disorder Sister     Social History Social History  Substance Use Topics  . Smoking status: Current Every Day Smoker    Packs/day: 0.50    Years: 8.00    Types: Cigarettes  . Smokeless tobacco: Never Used  . Alcohol use No     Allergies   Patient has no known allergies.   Review of Systems Review of Systems  Constitutional: Negative for chills, fatigue and fever.  HENT: Negative for congestion and rhinorrhea.   Eyes: Negative for visual disturbance.  Respiratory: Negative for cough, shortness of breath and wheezing.   Cardiovascular: Negative for chest pain and leg swelling.  Gastrointestinal: Negative for abdominal pain, diarrhea, nausea and vomiting.  Genitourinary: Positive for dysuria, flank pain and frequency. Negative for hematuria, vaginal bleeding, vaginal discharge and vaginal pain.  Musculoskeletal: Negative for neck pain and neck stiffness.  Skin: Negative for rash and wound.  Allergic/Immunologic: Negative for immunocompromised state.  Neurological: Negative for syncope, weakness and headaches.  All other systems reviewed and  are negative.    Physical Exam Updated Vital Signs BP 109/64 (BP Location: Left Arm)   Pulse 79   Temp 97.7 F (36.5 C) (Oral)   Resp 18   Ht 5\' 6"  (1.676 m)   Wt 135 lb (61.2 kg)   LMP 04/05/2016   SpO2 99%   BMI 21.79 kg/m   Physical Exam  Constitutional: She is oriented to person, place, and time. She appears well-developed and well-nourished. No distress.  HENT:  Head: Normocephalic and atraumatic.  Eyes: Conjunctivae are normal.  Neck: Neck supple.  Cardiovascular: Normal rate, regular rhythm and normal heart sounds.  Exam reveals no friction rub.   No murmur heard. Pulmonary/Chest: Effort normal and breath sounds normal. No respiratory distress. She has no wheezes. She has no rales.  Abdominal: Soft. Bowel sounds are normal. She exhibits no distension. There is tenderness. There is no rebound and no guarding.  +  CVAT on right with mild SP TTP  Musculoskeletal: She exhibits no edema.  Neurological: She is alert and oriented to person, place, and time. She exhibits normal muscle tone.  Skin: Skin is warm. Capillary refill takes less than 2 seconds.  Psychiatric: She has a normal mood and affect.  Nursing note and vitals reviewed.    ED Treatments / Results  Labs (all labs ordered are listed, but only abnormal results are displayed) Labs Reviewed  URINALYSIS, ROUTINE W REFLEX MICROSCOPIC (NOT AT [facility]) - Abnormal; Notable for the following:       Result Value   APPearance CLOUDY (*)    Hgb urine dipstick SMALL (*)    Bilirubin Urine SMALL (*)    Leukocytes, UA MODERATE (*)    All other components within normal limits  CBC WITH DIFFERENTIAL/PLATELET - Abnormal; Notable for the following:    WBC 11.1 (*)    All other components within normal limits  URINE MICROSCOPIC-ADD ON - Abnormal; Notable for the following:    Squamous Epithelial / LPF 6-30 (*)    Bacteria, UA MANY (*)    All other components within normal limits  URINE CULTURE  PREGNANCY, URINE  BASIC  METABOLIC PANEL    EKG  EKG Interpretation None       Radiology Ct Renal Stone Study  Result Date: 04/26/2016 CLINICAL DATA:  30 year old female with history of dysuria since yesterday evening. Bilateral flank pain. History of urinary tract calculi and urinary tract infections. EXAM: CT ABDOMEN AND PELVIS WITHOUT CONTRAST TECHNIQUE: Multidetector CT imaging of the abdomen and pelvis was performed following the standard protocol without IV contrast. COMPARISON:  CT the abdomen and pelvis 09/03/2015. FINDINGS: Lower chest: Unremarkable. Hepatobiliary: Status post cholecystectomy. No definite cystic or solid hepatic lesions are identified in the liver on today's noncontrast CT examination. Pancreas: No pancreatic mass or peripancreatic inflammatory changes are noted on today's noncontrast CT examination. Spleen: Unremarkable. Adrenals/Urinary Tract: There are no abnormal calcifications within the collecting system of either kidney, along the course of either ureter, or within the lumen of the urinary bladder. No hydroureteronephrosis or perinephric stranding to suggest urinary tract obstruction at this time. The unenhanced appearance of the kidneys is unremarkable bilaterally. Unenhanced appearance of the urinary bladder is normal. Bilateral adrenal glands are normal in appearance. Stomach/Bowel: Along the lesser curvature of the stomach immediately distal to the gastroesophageal junction there is an ovoid shaped 2.8 x 2.0 cm intermediate attenuation (34 HU) lesion which has been present in retrospect on prior studies dating back to at least 06/26/2014, and appears likely present on remote prior studies dating back to 2012, although it has slightly increased in size compared to the more remote prior examinations (although recently stable for over a year). Remainder of the stomach is otherwise unremarkable in appearance. No pathologic dilatation of small bowel or colon. Normal appendix. Vascular/Lymphatic:  No significant atherosclerotic disease or definite aneurysm identified in the abdominal or pelvic vasculature. No lymphadenopathy noted in the abdomen or pelvis on today's noncontrast CT examination. Reproductive: Uterus and ovaries are unremarkable in appearance. A tampon is present in the vagina. Other: No significant volume of ascites.  No pneumoperitoneum. Musculoskeletal: There are no aggressive appearing lytic or blastic lesions noted in the visualized portions of the skeleton. IMPRESSION: 1. No acute findings in the abdomen or pelvis. Specifically, no urinary tract calculi no findings to suggest urinary tract obstruction are noted at this time. 2. Indeterminate 2.8 x 2.0 cm intermediate attenuation lesion associated with  the lesser curvature of the stomach immediately distal to the gastroesophageal junction. This has been present on numerous prior examinations, and has been stable in size for greater than 1 year. 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 3. Additional incidental findings, as above. Electronically Signed   By: Vinnie Langton M.D.   On: 04/26/2016 09:40    Procedures Procedures (including critical care time)  Medications Ordered in ED Medications  sodium chloride 0.9 % bolus 1,000 mL (0 mLs Intravenous Stopped 04/26/16 1114)  ondansetron (ZOFRAN) injection 4 mg (4 mg Intravenous Given 04/26/16 0911)  cefTRIAXone (ROCEPHIN) 1 g in dextrose 5 % 50 mL IVPB (0 g Intravenous Stopped 04/26/16 0959)  ketorolac (TORADOL) 15 MG/ML injection 15 mg (15 mg Intravenous Given 04/26/16 0911)  HYDROcodone-acetaminophen (NORCO/VICODIN) 5-325 MG per tablet 1 tablet (1 tablet Oral Given 04/26/16 0959)  ondansetron (ZOFRAN-ODT) disintegrating tablet 4 mg (4 mg Oral Given 04/26/16 0959)     Initial Impression / Assessment and Plan / ED Course  I have reviewed the triage vital signs and the nursing  notes.  Pertinent labs & imaging results that were available during my care of the patient were reviewed by me and considered in my medical decision making (see chart for details).  Clinical Course     30 yo F with PMHx as above who p/w mild dysuria, right flank pain. On arrival, VSS and WNL. Pt non-toxic on exam. Exam c/w right CVAT, abdomen minimally tender, o/w w/o rebound or guarding. Labs, imaging is as above. UA with +bacteria, +LE c/w UTI, suspicion of early pyelo versus IC. No evidence of stone on CT stone study, obtained 2/2 acute onset of atypical pain. Pt denies any vaginal bleeding, discharge, adnexal pain, or evidence of TOA, PID, or torsion. Will treat with Levaquin given h/o good response (risks discussed, including tendon rupture), and d/c with outpt f/u. Return precautions given.  Final Clinical Impressions(s) / ED Diagnoses   Final diagnoses:  Pyelonephritis    New Prescriptions Discharge Medication List as of 04/26/2016 11:04 AM    START taking these medications   Details  HYDROcodone-acetaminophen (NORCO/VICODIN) 5-325 MG tablet Take 1-2 tablets by mouth every 6 (six) hours as needed for severe pain., Starting Mon 04/26/2016, Print    levofloxacin (LEVAQUIN) 750 MG tablet Take 1 tablet (750 mg total) by mouth daily., Starting Mon 04/26/2016, Until Mon 05/03/2016, Print    ondansetron (ZOFRAN) 4 MG tablet Take 1 tablet (4 mg total) by mouth every 8 (eight) hours as needed for nausea or vomiting., Starting Mon 04/26/2016, Print         Duffy Bruce, MD 04/26/16 2142

## 2016-04-26 NOTE — ED Notes (Signed)
Patient reports no relief from pain and nausea.  Requesting pain and nausea medication.

## 2016-04-26 NOTE — ED Triage Notes (Signed)
Pt having dysuria since last night.  No acute fever.  99.6 at home.  Some bilateral flank pain.

## 2016-04-26 NOTE — ED Notes (Signed)
ED Provider at bedside. 

## 2016-04-27 LAB — URINE CULTURE

## 2016-04-28 MED FILL — TRI-PREVIFEM TABLET: 0.18/0.215/ | 28 days supply | Qty: 28 | Fill #1

## 2016-05-11 ENCOUNTER — Encounter (HOSPITAL_BASED_OUTPATIENT_CLINIC_OR_DEPARTMENT_OTHER): Payer: Self-pay

## 2016-05-11 ENCOUNTER — Emergency Department (HOSPITAL_BASED_OUTPATIENT_CLINIC_OR_DEPARTMENT_OTHER)
Admission: EM | Admit: 2016-05-11 | Discharge: 2016-05-11 | Disposition: A | Discharge status: Home / Self Care | Payer: Self-pay | Attending: Emergency Medicine | Admitting: Emergency Medicine

## 2016-05-11 DIAGNOSIS — F909 Attention-deficit hyperactivity disorder, unspecified type: Secondary | ICD-10-CM | POA: Insufficient documentation

## 2016-05-11 DIAGNOSIS — B373 Candidiasis of vulva and vagina: Secondary | ICD-10-CM | POA: Insufficient documentation

## 2016-05-11 DIAGNOSIS — B3731 Acute candidiasis of vulva and vagina: Secondary | ICD-10-CM

## 2016-05-11 DIAGNOSIS — F17210 Nicotine dependence, cigarettes, uncomplicated: Secondary | ICD-10-CM | POA: Insufficient documentation

## 2016-05-11 DIAGNOSIS — R109 Unspecified abdominal pain: Secondary | ICD-10-CM

## 2016-05-11 LAB — HEPATIC FUNCTION PANEL
ALBUMIN: 3.4 g/dL — AB (ref 3.5–5.0)
ALT: 15 U/L (ref 14–54)
AST: 16 U/L (ref 15–41)
Alkaline Phosphatase: 32 U/L — ABNORMAL LOW (ref 38–126)
BILIRUBIN DIRECT: 0.1 mg/dL (ref 0.1–0.5)
Indirect Bilirubin: 0.5 mg/dL (ref 0.3–0.9)
TOTAL PROTEIN: 5.9 g/dL — AB (ref 6.5–8.1)
Total Bilirubin: 0.6 mg/dL (ref 0.3–1.2)

## 2016-05-11 LAB — CBC WITH DIFFERENTIAL/PLATELET
BASOS ABS: 0 10*3/uL (ref 0.0–0.1)
Basophils Relative: 0 %
EOS ABS: 0.2 10*3/uL (ref 0.0–0.7)
EOS PCT: 2 %
HCT: 39.3 % (ref 36.0–46.0)
Hemoglobin: 13 g/dL (ref 12.0–15.0)
LYMPHS PCT: 22 %
Lymphs Abs: 2.2 10*3/uL (ref 0.7–4.0)
MCH: 28.5 pg (ref 26.0–34.0)
MCHC: 33.1 g/dL (ref 30.0–36.0)
MCV: 86.2 fL (ref 78.0–100.0)
Monocytes Absolute: 0.6 10*3/uL (ref 0.1–1.0)
Monocytes Relative: 6 %
Neutro Abs: 6.8 10*3/uL (ref 1.7–7.7)
Neutrophils Relative %: 70 %
PLATELETS: 225 10*3/uL (ref 150–400)
RBC: 4.56 MIL/uL (ref 3.87–5.11)
RDW: 13.7 % (ref 11.5–15.5)
WBC: 9.9 10*3/uL (ref 4.0–10.5)

## 2016-05-11 LAB — URINE MICROSCOPIC-ADD ON

## 2016-05-11 LAB — BASIC METABOLIC PANEL
Anion gap: 6 (ref 5–15)
BUN: 10 mg/dL (ref 6–20)
CALCIUM: 8.9 mg/dL (ref 8.9–10.3)
CHLORIDE: 109 mmol/L (ref 101–111)
CO2: 25 mmol/L (ref 22–32)
CREATININE: 0.67 mg/dL (ref 0.44–1.00)
GFR calc Af Amer: >60 mL/min (ref >60)
GFR calc non Af Amer: >60 mL/min (ref >60)
GLUCOSE: 113 mg/dL — AB (ref 65–99)
Potassium: 3.7 mmol/L (ref 3.5–5.1)
Sodium: 140 mmol/L (ref 135–145)

## 2016-05-11 LAB — URINALYSIS, ROUTINE W REFLEX MICROSCOPIC
Glucose, UA: NEGATIVE mg/dL
KETONES UR: 40 mg/dL — AB
NITRITE: NEGATIVE
Protein, ur: NEGATIVE mg/dL
SPECIFIC GRAVITY, URINE: 1.027 (ref 1.005–1.030)
pH: 5.5 (ref 5.0–8.0)

## 2016-05-11 LAB — PREGNANCY, URINE: PREG TEST UR: NEGATIVE

## 2016-05-11 LAB — LIPASE, BLOOD: LIPASE: 16 U/L (ref 11–51)

## 2016-05-11 MED ORDER — SODIUM CHLORIDE 0.9 % IV BOLUS (SEPSIS)
1000.0000 mL | Freq: Once | INTRAVENOUS | Status: AC
  Administered 2016-05-11: 1000 mL via INTRAVENOUS

## 2016-05-11 MED ORDER — FLUCONAZOLE 100 MG PO TABS
150.0000 mg | ORAL_TABLET | Freq: Once | ORAL | Status: AC
  Administered 2016-05-11: 150 mg via ORAL
  Filled 2016-05-11: qty 1

## 2016-05-11 MED ORDER — KETOROLAC TROMETHAMINE 30 MG/ML IJ SOLN
30.0000 mg | Freq: Once | INTRAMUSCULAR | Status: AC
  Administered 2016-05-11: 30 mg via INTRAVENOUS
  Filled 2016-05-11: qty 1

## 2016-05-11 MED ORDER — ONDANSETRON HCL 4 MG/2ML IJ SOLN
4.0000 mg | Freq: Once | INTRAMUSCULAR | Status: AC
  Administered 2016-05-11: 4 mg via INTRAVENOUS
  Filled 2016-05-11: qty 2

## 2016-05-11 MED ORDER — HYDROCODONE-ACETAMINOPHEN 5-325 MG PO TABS
1.0000 | ORAL_TABLET | Freq: Once | ORAL | Status: AC
  Administered 2016-05-11: 1 via ORAL
  Filled 2016-05-11: qty 1

## 2016-05-11 MED ORDER — ALBUTEROL SULFATE HFA 108 (90 BASE) MCG/ACT IN AERS
1.0000 | INHALATION_SPRAY | Freq: Once | RESPIRATORY_TRACT | Status: DC
  Filled 2016-05-11: qty 6.7

## 2016-05-11 MED ORDER — METOCLOPRAMIDE HCL 5 MG/ML IJ SOLN
10.0000 mg | Freq: Once | INTRAMUSCULAR | Status: AC
  Administered 2016-05-11: 10 mg via INTRAVENOUS
  Filled 2016-05-11: qty 2

## 2016-05-11 MED ORDER — METOCLOPRAMIDE HCL 10 MG PO TABS: 10.0000 mg | ORAL_TABLET | Freq: Four times a day (QID) | ORAL | 0 refills | Status: DC

## 2016-05-11 MED ORDER — METHOCARBAMOL 500 MG PO TABS: 500.0000 mg | ORAL_TABLET | Freq: Two times a day (BID) | ORAL | 0 refills | Status: DC

## 2016-05-11 MED ORDER — NAPROXEN 500 MG PO TABS: 500.0000 mg | ORAL_TABLET | Freq: Two times a day (BID) | ORAL | 0 refills | Status: DC

## 2016-05-11 MED ORDER — HYDROMORPHONE HCL 1 MG/ML IJ SOLN
1.0000 mg | Freq: Once | INTRAMUSCULAR | Status: AC
  Administered 2016-05-11: 1 mg via INTRAVENOUS
  Filled 2016-05-11: qty 1

## 2016-05-11 MED ORDER — FLUCONAZOLE 150 MG PO TABS: ORAL_TABLET | ORAL | 0 refills | Status: DC

## 2016-05-11 NOTE — ED Provider Notes (Signed)
[place] DEPT [facility] Provider Note   CSN: [id number] Arrival date & time: 05/11/16  1206     History   Chief Complaint Chief Complaint  Patient presents with  . Flank Pain    HPI [name] is a 30 y.o. female.  HPI Patient with past medical history of chronic back pain, nephrolithiasis, interstitial cystitis, and recurrent pyelonephritis presents with right flank pain that began last night. She states this feels similar to her prior presentations of pyelonephritis. Associated symptoms include nausea and one episode of emesis as well as dysuria. No hematuria. No fever. No abdominal pain. No vaginal discharge or abnormal vaginal bleeding. Of note, patient was seen 04/26/2016 for similar presentation and discharged home with Levaquin after normal noncontrast CT stone study. She states she was feeling well while taking the antibiotic over the 7 days.   Past Medical History:  Diagnosis Date  . ADD (attention deficit disorder)   . Anxiety   . Anxiety disorder   . Back pain   . Barrett esophagus   . Basedow's disease   . Bipolar disorder ([place])   . Chronic back pain   . Fibroid   . Gastric reflux   . History of nephrolithiasis   . Interstitial cystitis   . Migraine   . Pyelonephritis     Patient Active Problem List   Diagnosis Date Noted  . Liver lesion 09/15/2015  . Dysuria 12/10/2014  . Cellulitis 10/02/2014  . Abdominal pain 06/17/2014  . Tobacco abuse 06/17/2014  . Vomiting 06/16/2014  . Back pain 05/21/2014  . Neck pain 05/21/2014  . Thoracic back pain 05/21/2014  . ADD (attention deficit disorder) 04/01/2014  . Eye pressure 03/14/2014  . Blurred vision, left eye 03/14/2014  . UTI (urinary tract infection) 02/28/2014  . Gallstones 01/05/2014  . Cholecystitis 01/05/2014  . Biliary dyskinesia 01/01/2014  . SVD (spontaneous vaginal delivery) 08/19/2013  . Indication for care in labor or delivery 08/18/2013  . Tick bite 02/28/2012  . Abscess  02/28/2012  . Boil 10/26/2011  . Bipolar 1 disorder ([place]) 10/15/2011  . Head injury 09/28/2010  . CRUSHING INJURY OF HAND 02/10/2010  . EYE PAIN, LEFT 12/04/2009  . MOOD SWINGS 11/28/2009  . ARTHRITIS, BACK 10/02/2009  . ANXIETY STATE, UNSPECIFIED 09/23/2009  . KNEE PAIN, RIGHT 02/21/2009  . ANKLE PAIN, RIGHT 02/21/2009  . NAUSEA AND VOMITING 10/16/2008  . NEPHROLITHIASIS, HX OF 05/28/2008  . GASTROINTESTINAL XRAY, ABNORMAL 04/04/2008  . SYMPTOM, PAIN, ABDOMINAL, RIGHT UP QUADRANT 03/08/2007  . FLANK PAIN, RIGHT 03/08/2007  . BASEDOW'S DISEASE 03/06/2007  . PYELONEPHRITIS NOS 02/17/2007  . FATIGUE 02/17/2007  . OTITIS EXTERNA, ACUTE, LEFT 12/06/2006  . GERD 12/06/2006  . BACK PAIN 12/06/2006    Past Surgical History:  Procedure Laterality Date  . ADENOIDECTOMY    . CHOLECYSTECTOMY N/A 01/05/2014   Procedure: LAPAROSCOPIC CHOLECYSTECTOMY WITH INTRAOPERATIVE CHOLANGIOGRAM;  Surgeon: Edward Jolly, MD;  Location: [facility];  Service: General;  Laterality: N/A;  . ESOPHAGOGASTRODUODENOSCOPY (EGD) WITH PROPOFOL N/A 06/18/2014   Procedure: ESOPHAGOGASTRODUODENOSCOPY (EGD) WITH PROPOFOL;  Surgeon: Wonda Horner, MD;  Location: [facility] ENDOSCOPY;  Service: Endoscopy;  Laterality: N/A;  . TEAR DUCT PROBING    . TONSILLECTOMY    . WISDOM TOOTH EXTRACTION      OB History    Gravida Para Term Preterm AB Living   1 1 1     1    SAB TAB Ectopic Multiple Live Births  1       Home Medications    Prior to Admission medications   Medication Sig Start Date End Date Taking? Authorizing Provider  butalbital-acetaminophen-caffeine (FIORICET) [id number] MG tablet Take 1-2 tablets by mouth every 6 (six) hours as needed for headache. 02/16/16 02/15/17  Shawn C Joy, PA-C  fluconazole (DIFLUCAN) 150 MG tablet Take one tablet in 72 hours if your symptoms persist. 05/11/16   Gloriann Loan, PA-C  methocarbamol (ROBAXIN) 500 MG tablet Take 1 tablet (500 mg total) by mouth 2 (two) times daily. 05/11/16    Gloriann Loan, PA-C  metoCLOPramide (REGLAN) 10 MG tablet Take 1 tablet (10 mg total) by mouth every 6 (six) hours. 05/11/16   Gloriann Loan, PA-C  naproxen (NAPROSYN) 500 MG tablet Take 1 tablet (500 mg total) by mouth 2 (two) times daily. 05/11/16   Gloriann Loan, PA-C  Norgestimate-Ethinyl Estradiol Triphasic (TRI-PREVIFEM) 0.18/0.215/0.25 MG-35 MCG tablet Take 1 tablet by mouth daily.    Historical Provider, MD  ondansetron (ZOFRAN ODT) 4 MG disintegrating tablet Take 1 tablet (4 mg total) by mouth every 8 (eight) hours as needed for nausea or vomiting. 02/16/16   Shawn C Joy, PA-C  ondansetron (ZOFRAN) 4 MG tablet Take 1 tablet (4 mg total) by mouth every 8 (eight) hours as needed for nausea or vomiting. 04/26/16   Duffy Bruce, MD    Family History Family History  Problem Relation Age of Onset  . Diabetes    . Hypertension    . Lung cancer    . Bipolar disorder Sister     Social History Social History  Substance Use Topics  . Smoking status: Current Every Day Smoker    Packs/day: 0.50    Years: 8.00    Types: Cigarettes  . Smokeless tobacco: Never Used  . Alcohol use No     Allergies   Patient has no known allergies.   Review of Systems Review of Systems All other systems negative unless otherwise stated in HPI   Physical Exam Updated Vital Signs BP 106/79   Pulse 81   Temp 97.7 F (36.5 C) (Oral)   Resp 18   Ht 5\' 6"  (1.676 m)   Wt 63.5 kg   LMP 05/03/2016   SpO2 100%   BMI 22.60 kg/m   Physical Exam  Constitutional: She is oriented to person, place, and time. She appears well-developed and well-nourished.  Non-toxic appearance. She does not have a sickly appearance. She does not appear ill.  HENT:  Head: Normocephalic and atraumatic.  Mouth/Throat: Oropharynx is clear and moist.  Eyes: Conjunctivae are normal. Pupils are equal, round, and reactive to light.  Neck: Normal range of motion. Neck supple.  Cardiovascular: Normal rate and regular rhythm.     Pulmonary/Chest: Effort normal and breath sounds normal. No accessory muscle usage or stridor. No respiratory distress. She has no wheezes. She has no rhonchi. She has no rales.  Abdominal: Soft. Bowel sounds are normal. She exhibits no distension. There is no tenderness. There is no rebound and no guarding.  Bilateral CVA tenderness, greater on the right.  Musculoskeletal: Normal range of motion.  Lymphadenopathy:    She has no cervical adenopathy.  Neurological: She is alert and oriented to person, place, and time.  Speech clear without dysarthria.  Skin: Skin is warm and dry.  Psychiatric: She has a normal mood and affect. Her behavior is normal.     ED Treatments / Results  Labs (all labs ordered are listed, but only abnormal  results are displayed) Labs Reviewed  URINALYSIS, ROUTINE W REFLEX MICROSCOPIC (NOT AT [facility]) - Abnormal; Notable for the following:       Result Value   Color, Urine AMBER (*)    APPearance CLOUDY (*)    Hgb urine dipstick TRACE (*)    Bilirubin Urine SMALL (*)    Ketones, ur 40 (*)    Leukocytes, UA SMALL (*)    All other components within normal limits  BASIC METABOLIC PANEL - Abnormal; Notable for the following:    Glucose, Bld 113 (*)    All other components within normal limits  URINE MICROSCOPIC-ADD ON - Abnormal; Notable for the following:    Squamous Epithelial / LPF 6-30 (*)    Bacteria, UA MANY (*)    All other components within normal limits  HEPATIC FUNCTION PANEL - Abnormal; Notable for the following:    Total Protein 5.9 (*)    Albumin 3.4 (*)    Alkaline Phosphatase 32 (*)    All other components within normal limits  PREGNANCY, URINE  CBC WITH DIFFERENTIAL/PLATELET  LIPASE, BLOOD    EKG  EKG Interpretation None       Radiology No results found.  Procedures Procedures (including critical care time)  Medications Ordered in ED Medications  fluconazole (DIFLUCAN) tablet 150 mg (not administered)  ondansetron (ZOFRAN)  injection 4 mg (4 mg Intravenous Given 05/11/16 1255)  HYDROmorphone (DILAUDID) injection 1 mg (1 mg Intravenous Given 05/11/16 1256)  ketorolac (TORADOL) 30 MG/ML injection 30 mg (30 mg Intravenous Given 05/11/16 1334)  metoCLOPramide (REGLAN) injection 10 mg (10 mg Intravenous Given 05/11/16 1334)  sodium chloride 0.9 % bolus 1,000 mL (0 mLs Intravenous Stopped 05/11/16 1609)  HYDROcodone-acetaminophen (NORCO/VICODIN) 5-325 MG per tablet 1 tablet (1 tablet Oral Given 05/11/16 1608)     Initial Impression / Assessment and Plan / ED Course  I have reviewed the triage vital signs and the nursing notes.  Pertinent labs & imaging results that were available during my care of the patient were reviewed by me and considered in my medical decision making (see chart for details).  Clinical Course    Patient with recurrent pyelonephritis presents with right flank pain.  Similar presentations as previous with the last being 11/13.  At that time, she had a normal CT. Today, Labs without acute abnormalities.  UA does not appear infectious but does show some yeast. Patient treated with by mouth Diflucan. No indication for repeat imaging today, I do not suspect nephrolithiasis or Rochele Raring syndrome. Patient continues to complain of pain after Dilaudid and Toradol, but sitting in bed in NAD.  I suspect possible drug seeking behavior, and will not be prescribing any narcotics.  Likely musculoskeletal. Patient has not had any episodes of emesis while in the ED and able to tolerate PO without difficulty.I suspect her symptoms are likely secondary to chronic pain and interstitial cystitis. Patient has been seen by [facility], encouraged follow-up with them. Flank pain could also be musculoskeletal. Plan to discharge home with NSAIDs, muscle relaxer, and antiemetic. Return precautions discussed including new or worsening symptoms. Patient stable for discharge. Final Clinical Impressions(s) / ED Diagnoses     Final diagnoses:  Right flank pain  Vaginal candidiasis    New Prescriptions New Prescriptions   FLUCONAZOLE (DIFLUCAN) 150 MG TABLET    Take one tablet in 72 hours if your symptoms persist.   METHOCARBAMOL (ROBAXIN) 500 MG TABLET    Take 1 tablet (500 mg total) by  mouth 2 (two) times daily.   METOCLOPRAMIDE (REGLAN) 10 MG TABLET    Take 1 tablet (10 mg total) by mouth every 6 (six) hours.   NAPROXEN (NAPROSYN) 500 MG TABLET    Take 1 tablet (500 mg total) by mouth 2 (two) times daily.     Gloriann Loan, PA-C 05/11/16 [place], MD 05/11/16 [phone]

## 2016-05-11 NOTE — ED Notes (Addendum)
Pt states the pain became much worse after voiding. EDP notified.

## 2016-05-11 NOTE — ED Notes (Signed)
Pt teaching provided on medications that may cause drowsiness. Pt instructed not to drive or operate heavy machinery while taking the prescribed medication. Pt verbalized understanding.   

## 2016-05-11 NOTE — ED Triage Notes (Signed)
C/o right flank pain x 2 days-NAD-steady gait

## 2016-05-11 NOTE — ED Notes (Signed)
EDP notified of pt's VS.  °

## 2016-05-25 MED FILL — NORG-EE 0.18-0.215-0.25/0.0: 0.18/0.215/ | 28 days supply | Qty: 28 | Fill #2

## 2016-06-08 ENCOUNTER — Emergency Department (HOSPITAL_BASED_OUTPATIENT_CLINIC_OR_DEPARTMENT_OTHER): Payer: Self-pay

## 2016-06-08 ENCOUNTER — Encounter (HOSPITAL_BASED_OUTPATIENT_CLINIC_OR_DEPARTMENT_OTHER): Payer: Self-pay | Admitting: Emergency Medicine

## 2016-06-08 ENCOUNTER — Emergency Department (HOSPITAL_BASED_OUTPATIENT_CLINIC_OR_DEPARTMENT_OTHER)
Admission: EM | Admit: 2016-06-08 | Discharge: 2016-06-08 | Disposition: A | Discharge status: Home / Self Care | Payer: Self-pay | Attending: Emergency Medicine | Admitting: Emergency Medicine

## 2016-06-08 DIAGNOSIS — R1031 Right lower quadrant pain: Secondary | ICD-10-CM

## 2016-06-08 DIAGNOSIS — R102 Pelvic and perineal pain: Secondary | ICD-10-CM

## 2016-06-08 DIAGNOSIS — K7689 Other specified diseases of liver: Secondary | ICD-10-CM | POA: Insufficient documentation

## 2016-06-08 DIAGNOSIS — K769 Liver disease, unspecified: Secondary | ICD-10-CM

## 2016-06-08 DIAGNOSIS — Z79899 Other long term (current) drug therapy: Secondary | ICD-10-CM | POA: Insufficient documentation

## 2016-06-08 DIAGNOSIS — B9689 Other specified bacterial agents as the cause of diseases classified elsewhere: Secondary | ICD-10-CM

## 2016-06-08 DIAGNOSIS — N940 Mittelschmerz: Secondary | ICD-10-CM

## 2016-06-08 DIAGNOSIS — K3189 Other diseases of stomach and duodenum: Secondary | ICD-10-CM

## 2016-06-08 DIAGNOSIS — N760 Acute vaginitis: Secondary | ICD-10-CM | POA: Insufficient documentation

## 2016-06-08 DIAGNOSIS — F17210 Nicotine dependence, cigarettes, uncomplicated: Secondary | ICD-10-CM | POA: Insufficient documentation

## 2016-06-08 LAB — CBC WITH DIFFERENTIAL/PLATELET
BASOS ABS: 0 10*3/uL (ref 0.0–0.1)
BASOS PCT: 0 %
EOS ABS: 0.1 10*3/uL (ref 0.0–0.7)
EOS PCT: 1 %
HCT: 41 % (ref 36.0–46.0)
Hemoglobin: 13.5 g/dL (ref 12.0–15.0)
LYMPHS PCT: 15 %
Lymphs Abs: 1.7 10*3/uL (ref 0.7–4.0)
MCH: 28.2 pg (ref 26.0–34.0)
MCHC: 32.9 g/dL (ref 30.0–36.0)
MCV: 85.6 fL (ref 78.0–100.0)
MONO ABS: 0.6 10*3/uL (ref 0.1–1.0)
Monocytes Relative: 5 %
Neutro Abs: 9.2 10*3/uL — ABNORMAL HIGH (ref 1.7–7.7)
Neutrophils Relative %: 79 %
PLATELETS: 248 10*3/uL (ref 150–400)
RBC: 4.79 MIL/uL (ref 3.87–5.11)
RDW: 13.6 % (ref 11.5–15.5)
WBC: 11.6 10*3/uL — AB (ref 4.0–10.5)

## 2016-06-08 LAB — URINALYSIS, ROUTINE W REFLEX MICROSCOPIC
Bilirubin Urine: NEGATIVE
GLUCOSE, UA: NEGATIVE mg/dL
Ketones, ur: NEGATIVE mg/dL
Nitrite: NEGATIVE
PROTEIN: NEGATIVE mg/dL
SPECIFIC GRAVITY, URINE: 1.018 (ref 1.005–1.030)
pH: 6 (ref 5.0–8.0)

## 2016-06-08 LAB — COMPREHENSIVE METABOLIC PANEL
ALBUMIN: 3.6 g/dL (ref 3.5–5.0)
ALT: 21 U/L (ref 14–54)
AST: 20 U/L (ref 15–41)
Alkaline Phosphatase: 39 U/L (ref 38–126)
Anion gap: 8 (ref 5–15)
BUN: 6 mg/dL (ref 6–20)
CHLORIDE: 105 mmol/L (ref 101–111)
CO2: 25 mmol/L (ref 22–32)
CREATININE: 0.64 mg/dL (ref 0.44–1.00)
Calcium: 8.9 mg/dL (ref 8.9–10.3)
GFR calc Af Amer: >60 mL/min (ref >60)
GLUCOSE: 94 mg/dL (ref 65–99)
POTASSIUM: 3.6 mmol/L (ref 3.5–5.1)
Sodium: 138 mmol/L (ref 135–145)
Total Bilirubin: 0.3 mg/dL (ref 0.3–1.2)
Total Protein: 6.4 g/dL — ABNORMAL LOW (ref 6.5–8.1)

## 2016-06-08 LAB — URINALYSIS, MICROSCOPIC (REFLEX)

## 2016-06-08 LAB — LIPASE, BLOOD: LIPASE: 26 U/L (ref 11–51)

## 2016-06-08 LAB — PREGNANCY, URINE: Preg Test, Ur: NEGATIVE

## 2016-06-08 LAB — WET PREP, GENITAL
SPERM: NONE SEEN
Trich, Wet Prep: NONE SEEN
YEAST WET PREP: NONE SEEN

## 2016-06-08 MED ORDER — HYDROMORPHONE HCL 1 MG/ML IJ SOLN
0.5000 mg | Freq: Once | INTRAMUSCULAR | Status: AC
  Administered 2016-06-08: 0.5 mg via INTRAVENOUS
  Filled 2016-06-08: qty 1

## 2016-06-08 MED ORDER — METRONIDAZOLE 500 MG PO TABS: 500.0000 mg | ORAL_TABLET | Freq: Two times a day (BID) | ORAL | 0 refills | Status: DC

## 2016-06-08 MED ORDER — HYDROCODONE-ACETAMINOPHEN 5-325 MG PO TABS: 2.0000 | ORAL_TABLET | ORAL | 0 refills | Status: DC | PRN

## 2016-06-08 MED ORDER — SODIUM CHLORIDE 0.9 % IV BOLUS (SEPSIS)
1000.0000 mL | Freq: Once | INTRAVENOUS | Status: AC
  Administered 2016-06-08: 1000 mL via INTRAVENOUS

## 2016-06-08 MED ORDER — ONDANSETRON HCL 4 MG PO TABS: 4.0000 mg | ORAL_TABLET | Freq: Three times a day (TID) | ORAL | 0 refills | Status: DC | PRN

## 2016-06-08 MED ORDER — ONDANSETRON HCL 4 MG/2ML IJ SOLN
4.0000 mg | Freq: Once | INTRAMUSCULAR | Status: AC
  Administered 2016-06-08: 4 mg via INTRAVENOUS
  Filled 2016-06-08: qty 2

## 2016-06-08 MED ORDER — IOPAMIDOL (ISOVUE-300) INJECTION 61%
100.0000 mL | Freq: Once | INTRAVENOUS | Status: AC | PRN
  Administered 2016-06-08: 100 mL via INTRAVENOUS

## 2016-06-08 MED FILL — ONDANSETRON HCL 4 MG TABLET: 4 | 4 days supply | Qty: 10 | Fill #0

## 2016-06-08 MED FILL — metroNIDAZOLE 500 MG TABS: 500 | 7 days supply | Qty: 14 | Fill #0

## 2016-06-08 MED FILL — HYDROCODON-APAP 5-325: 5-325 | 1 days supply | Qty: 10 | Fill #0

## 2016-06-08 NOTE — Discharge Instructions (Signed)

## 2016-06-08 NOTE — ED Notes (Signed)
Pt requesting something else for pain and nausea.

## 2016-06-08 NOTE — ED Notes (Signed)
Patient transported to Ultrasound 

## 2016-06-08 NOTE — ED Provider Notes (Signed)
[place] DEPT [facility] Provider Note   CSN: [id number] Arrival date & time: 06/08/16  1036     History   Chief Complaint Chief Complaint  Patient presents with  . Abdominal Pain    HPI [name] is a 30 y.o. female WITH PMH  Of graves, recurrent pyelo  Who presents with cc of RLW abdominal pain . Patient states that we had sudden onset of pain in the left lower quadrant that has been progressively worsening since onset at about 8:30 AM. She has associated nausea and states that it is worsened with any movement, coughing, laughing. She feels better lying very still. She has not vomitted and denies fever. LMP was 06/01/2016. She denies urinary or vaginal sxs. Her pain and nausea are severe. She denies a hx of kidney stones or current flank pain . PSHx of chloecystectomy. No hx of ruptured ovarian cyst.  HPI  Past Medical History:  Diagnosis Date  . ADD (attention deficit disorder)   . Anxiety   . Anxiety disorder   . Back pain   . Barrett esophagus   . Basedow's disease   . Bipolar disorder ([place])   . Chronic back pain   . Fibroid   . Gastric reflux   . History of nephrolithiasis   . Interstitial cystitis   . Migraine   . Pyelonephritis     Patient Active Problem List   Diagnosis Date Noted  . Liver lesion 09/15/2015  . Dysuria 12/10/2014  . Cellulitis 10/02/2014  . Abdominal pain 06/17/2014  . Tobacco abuse 06/17/2014  . Vomiting 06/16/2014  . Back pain 05/21/2014  . Neck pain 05/21/2014  . Thoracic back pain 05/21/2014  . ADD (attention deficit disorder) 04/01/2014  . Eye pressure 03/14/2014  . Blurred vision, left eye 03/14/2014  . UTI (urinary tract infection) 02/28/2014  . Gallstones 01/05/2014  . Cholecystitis 01/05/2014  . Biliary dyskinesia 01/01/2014  . SVD (spontaneous vaginal delivery) 08/19/2013  . Indication for care in labor or delivery 08/18/2013  . Tick bite 02/28/2012  . Abscess 02/28/2012  . Boil 10/26/2011  . Bipolar 1 disorder  ([place]) 10/15/2011  . Head injury 09/28/2010  . CRUSHING INJURY OF HAND 02/10/2010  . EYE PAIN, LEFT 12/04/2009  . MOOD SWINGS 11/28/2009  . ARTHRITIS, BACK 10/02/2009  . ANXIETY STATE, UNSPECIFIED 09/23/2009  . KNEE PAIN, RIGHT 02/21/2009  . ANKLE PAIN, RIGHT 02/21/2009  . NAUSEA AND VOMITING 10/16/2008  . NEPHROLITHIASIS, HX OF 05/28/2008  . GASTROINTESTINAL XRAY, ABNORMAL 04/04/2008  . SYMPTOM, PAIN, ABDOMINAL, RIGHT UP QUADRANT 03/08/2007  . FLANK PAIN, RIGHT 03/08/2007  . BASEDOW'S DISEASE 03/06/2007  . PYELONEPHRITIS NOS 02/17/2007  . FATIGUE 02/17/2007  . OTITIS EXTERNA, ACUTE, LEFT 12/06/2006  . GERD 12/06/2006  . BACK PAIN 12/06/2006    Past Surgical History:  Procedure Laterality Date  . ADENOIDECTOMY    . CHOLECYSTECTOMY N/A 01/05/2014   Procedure: LAPAROSCOPIC CHOLECYSTECTOMY WITH INTRAOPERATIVE CHOLANGIOGRAM;  Surgeon: Edward Jolly, MD;  Location: [facility];  Service: General;  Laterality: N/A;  . ESOPHAGOGASTRODUODENOSCOPY (EGD) WITH PROPOFOL N/A 06/18/2014   Procedure: ESOPHAGOGASTRODUODENOSCOPY (EGD) WITH PROPOFOL;  Surgeon: Wonda Horner, MD;  Location: [facility] ENDOSCOPY;  Service: Endoscopy;  Laterality: N/A;  . TEAR DUCT PROBING    . TONSILLECTOMY    . WISDOM TOOTH EXTRACTION      OB History    Gravida Para Term Preterm AB Living   1 1 1     1    SAB TAB Ectopic Multiple Live Births  1       Home Medications    Prior to Admission medications   Medication Sig Start Date End Date Taking? Authorizing Provider  butalbital-acetaminophen-caffeine (FIORICET) [id number] MG tablet Take 1-2 tablets by mouth every 6 (six) hours as needed for headache. 02/16/16 02/15/17  Shawn C Joy, PA-C  fluconazole (DIFLUCAN) 150 MG tablet Take one tablet in 72 hours if your symptoms persist. 05/11/16   Gloriann Loan, PA-C  methocarbamol (ROBAXIN) 500 MG tablet Take 1 tablet (500 mg total) by mouth 2 (two) times daily. 05/11/16   Gloriann Loan, PA-C  metoCLOPramide (REGLAN) 10 MG  tablet Take 1 tablet (10 mg total) by mouth every 6 (six) hours. 05/11/16   Gloriann Loan, PA-C  naproxen (NAPROSYN) 500 MG tablet Take 1 tablet (500 mg total) by mouth 2 (two) times daily. 05/11/16   Gloriann Loan, PA-C  Norgestimate-Ethinyl Estradiol Triphasic (TRI-PREVIFEM) 0.18/0.215/0.25 MG-35 MCG tablet Take 1 tablet by mouth daily.    Historical Provider, MD  ondansetron (ZOFRAN ODT) 4 MG disintegrating tablet Take 1 tablet (4 mg total) by mouth every 8 (eight) hours as needed for nausea or vomiting. 02/16/16   Shawn C Joy, PA-C  ondansetron (ZOFRAN) 4 MG tablet Take 1 tablet (4 mg total) by mouth every 8 (eight) hours as needed for nausea or vomiting. 04/26/16   Duffy Bruce, MD    Family History Family History  Problem Relation Age of Onset  . Diabetes    . Hypertension    . Lung cancer    . Bipolar disorder Sister     Social History Social History  Substance Use Topics  . Smoking status: Current Every Day Smoker    Packs/day: 0.50    Years: 8.00    Types: Cigarettes  . Smokeless tobacco: Never Used  . Alcohol use No     Allergies   Patient has no known allergies.   Review of Systems Review of Systems  Ten systems reviewed and are negative for acute change, except as noted in the HPI.   Physical Exam Updated Vital Signs BP 143/91 (BP Location: Left Arm)   Pulse 89   Temp 98 F (36.7 C) (Oral)   Resp 18   Ht 5\' 6"  (1.676 m)   Wt 63.5 kg   LMP 06/01/2016   SpO2 100%   BMI 22.60 kg/m   Physical Exam  Constitutional: She is oriented to person, place, and time. She appears well-developed and well-nourished. No distress.  HENT:  Head: Normocephalic and atraumatic.  Eyes: Conjunctivae are normal. No scleral icterus.  Neck: Normal range of motion.  Cardiovascular: Normal rate, regular rhythm and normal heart sounds.  Exam reveals no gallop and no friction rub.   No murmur heard. Pulmonary/Chest: Effort normal and breath sounds normal. No respiratory distress.    Abdominal: Soft. Bowel sounds are normal. There is tenderness.  Quiet abdomen. Lying in fetal position on the R . She is guarding the abdomen. + RLQ tenderness with Rebound and +Rovsings No CVA tenderenss  Genitourinary:  Genitourinary Comments: Normal external female genitalia, vagina, cervix. Moderate white vaginal discharge. No cervical motion tenderness, normal nontender, adnexae without mass  Neurological: She is alert and oriented to person, place, and time.  Skin: Skin is warm and dry. She is not diaphoretic.  Nursing note and vitals reviewed.    ED Treatments / Results  Labs (all labs ordered are listed, but only abnormal results are displayed) Labs Reviewed  CBC WITH DIFFERENTIAL/PLATELET - Abnormal; Notable for the  following:       Result Value   WBC 11.6 (*)    Neutro Abs 9.2 (*)    All other components within normal limits  URINALYSIS, ROUTINE W REFLEX MICROSCOPIC - Abnormal; Notable for the following:    APPearance CLOUDY (*)    Hgb urine dipstick SMALL (*)    Leukocytes, UA SMALL (*)    All other components within normal limits  URINALYSIS, MICROSCOPIC (REFLEX) - Abnormal; Notable for the following:    Bacteria, UA FEW (*)    Squamous Epithelial / LPF 6-30 (*)    All other components within normal limits  PREGNANCY, URINE  COMPREHENSIVE METABOLIC PANEL  LIPASE, BLOOD    EKG  EKG Interpretation None       Radiology No results found.  Procedures Procedures (including critical care time)  Medications Ordered in ED Medications  HYDROmorphone (DILAUDID) injection 0.5 mg (0.5 mg Intravenous Given 06/08/16 1206)  ondansetron (ZOFRAN) injection 4 mg (4 mg Intravenous Given 06/08/16 1205)  sodium chloride 0.9 % bolus 1,000 mL (0 mLs Intravenous Stopped 06/08/16 1259)  iopamidol (ISOVUE-300) 61 % injection 100 mL (100 mLs Intravenous Contrast Given 06/08/16 1231)  HYDROmorphone (DILAUDID) injection 0.5 mg (0.5 mg Intravenous Given 06/08/16 1434)      Initial Impression / Assessment and Plan / ED Course  I have reviewed the triage vital signs and the nursing notes.  Pertinent labs & imaging results that were available during my care of the patient were reviewed by me and considered in my medical decision making (see chart for details).  Clinical Course as of Jun 08 1209  Tue Jun 08, 2016  1207 Pt with RLQ tenderness.  DDX includes appendicitis, terminal ileitis/ Crohns, mesenteric adenitis, urolithiasis, Ovarian torsion, TOA. Urine preg negative So I doubt ruptured ectopic. Given her peritoneal signs I favor appendicitis.  UA appears contaminated. Pain meds, fluids, antiemetics started and I will obtain CT.   [AH]    Clinical Course User Index [AH] Margarita Mail, PA-C   Patient with a CT scan that has abnormality, but none of which are contributory to today's symptoms. Her pelvic ultrasound was negative. 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 She still having moderate to severe pain. Her pelvic ultrasound does not show any ovarian torsion. I discussed the abnormal findings on the patient's CT including fatty liver, liver lesion which needs follow-up and masses #her stomach. The patient is aware of these findings and how to follow-up. I also gave her a printout of her CT scan results, so she can see directly to the [facility] and recommendations. Had no vomiting in [facility]. She is ambulatory. We'll treat her bacterial vaginosis with Flagyl. Discussed that she should not have any alcohol with this medication. Patient appears safe for discharge at this time.    Final Clinical Impressions(s) / ED Diagnoses   Final diagnoses:  RLQ abdominal pain   New Prescriptions New Prescriptions   No medications on file     Margarita Mail, PA-C 06/08/16 [place], MD 06/09/16 [phone]

## 2016-06-08 NOTE — ED Triage Notes (Signed)
Pt c/o RLQ pain and nausea; onset today

## 2016-06-09 LAB — GC/CHLAMYDIA PROBE AMP (~~LOC~~) NOT AT ARMC
Chlamydia: NEGATIVE
NEISSERIA GONORRHEA: NEGATIVE

## 2016-06-17 ENCOUNTER — Emergency Department (HOSPITAL_COMMUNITY)
Admission: EM | Admit: 2016-06-17 | Discharge: 2016-06-17 | Disposition: A | Discharge status: Home / Self Care | Payer: Self-pay | Attending: Emergency Medicine | Admitting: Emergency Medicine

## 2016-06-17 ENCOUNTER — Emergency Department (HOSPITAL_COMMUNITY): Payer: Self-pay

## 2016-06-17 ENCOUNTER — Encounter (HOSPITAL_COMMUNITY): Payer: Self-pay | Admitting: Emergency Medicine

## 2016-06-17 DIAGNOSIS — Y999 Unspecified external cause status: Secondary | ICD-10-CM | POA: Insufficient documentation

## 2016-06-17 DIAGNOSIS — W010XXA Fall on same level from slipping, tripping and stumbling without subsequent striking against object, initial encounter: Secondary | ICD-10-CM | POA: Insufficient documentation

## 2016-06-17 DIAGNOSIS — S8991XA Unspecified injury of right lower leg, initial encounter: Secondary | ICD-10-CM | POA: Insufficient documentation

## 2016-06-17 DIAGNOSIS — Y929 Unspecified place or not applicable: Secondary | ICD-10-CM | POA: Insufficient documentation

## 2016-06-17 DIAGNOSIS — F17210 Nicotine dependence, cigarettes, uncomplicated: Secondary | ICD-10-CM | POA: Insufficient documentation

## 2016-06-17 DIAGNOSIS — Y9351 Activity, roller skating (inline) and skateboarding: Secondary | ICD-10-CM | POA: Insufficient documentation

## 2016-06-17 DIAGNOSIS — F909 Attention-deficit hyperactivity disorder, unspecified type: Secondary | ICD-10-CM | POA: Insufficient documentation

## 2016-06-17 MED ORDER — IBUPROFEN 800 MG PO TABS
800.0000 mg | ORAL_TABLET | Freq: Once | ORAL | Status: AC
  Administered 2016-06-17: 800 mg via ORAL
  Filled 2016-06-17: qty 1

## 2016-06-17 MED ORDER — IBUPROFEN 800 MG PO TABS: 800.0000 mg | ORAL_TABLET | Freq: Three times a day (TID) | ORAL | 0 refills | Status: DC | PRN

## 2016-06-17 NOTE — ED Provider Notes (Signed)
[place] DEPT Provider Note   CSN: [id number] Arrival date & time: 06/17/16  1044  By signing my name below, I, [name], attest that this documentation has been prepared under the direction and in the presence of [facility], PA-C. Electronically Signed: Higinio Massey, Scribe. 06/17/2016. 11:56 AM.  History   Chief Complaint Chief Complaint  Patient presents with  . Knee Injury   The history is provided by the patient. No language interpreter was used.   HPI Comments: [name] is a 31 y.o. female who presents to the Emergency Department complaining of gradually worsening, right knee pain s/p a fall that occurred 3 days ago. Pt reports she was roller skating with her son 3 days ago when she suddenly slipped, causing her to fall and "twist" her right knee. She notes she heard her knee "pop" after her fall and has experienced "grinding" in her knee which is a new sensation for her. She states her pain is exacerbated when bending her right knee, bearing weight, and ambulating. Pt also reports hx of [place] in her right knee and states she was told her pain "would go away when she turned 18;" however, she states she still experiences chronic pain in this knee. She denies any head injury or loss of consciousness after her fall and numbness or weakness in her extremities. Denies any other injury.  Past Medical History:  Diagnosis Date  . ADD (attention deficit disorder)   . Anxiety   . Anxiety disorder   . Back pain   . Barrett esophagus   . Basedow's disease   . Bipolar disorder ([place])   . Chronic back pain   . Fibroid   . Gastric reflux   . History of nephrolithiasis   . Interstitial cystitis   . Migraine   . Pyelonephritis     Patient Active Problem List   Diagnosis Date Noted  . Liver lesion 09/15/2015  . Dysuria 12/10/2014  . Cellulitis 10/02/2014  . Abdominal pain 06/17/2014  . Tobacco abuse 06/17/2014  . Vomiting 06/16/2014  . Back pain 05/21/2014  .  Neck pain 05/21/2014  . Thoracic back pain 05/21/2014  . ADD (attention deficit disorder) 04/01/2014  . Eye pressure 03/14/2014  . Blurred vision, left eye 03/14/2014  . UTI (urinary tract infection) 02/28/2014  . Gallstones 01/05/2014  . Cholecystitis 01/05/2014  . Biliary dyskinesia 01/01/2014  . SVD (spontaneous vaginal delivery) 08/19/2013  . Indication for care in labor or delivery 08/18/2013  . Tick bite 02/28/2012  . Abscess 02/28/2012  . Boil 10/26/2011  . Bipolar 1 disorder ([place]) 10/15/2011  . Head injury 09/28/2010  . CRUSHING INJURY OF HAND 02/10/2010  . EYE PAIN, LEFT 12/04/2009  . MOOD SWINGS 11/28/2009  . ARTHRITIS, BACK 10/02/2009  . ANXIETY STATE, UNSPECIFIED 09/23/2009  . KNEE PAIN, RIGHT 02/21/2009  . ANKLE PAIN, RIGHT 02/21/2009  . NAUSEA AND VOMITING 10/16/2008  . NEPHROLITHIASIS, HX OF 05/28/2008  . GASTROINTESTINAL XRAY, ABNORMAL 04/04/2008  . SYMPTOM, PAIN, ABDOMINAL, RIGHT UP QUADRANT 03/08/2007  . FLANK PAIN, RIGHT 03/08/2007  . BASEDOW'S DISEASE 03/06/2007  . PYELONEPHRITIS NOS 02/17/2007  . FATIGUE 02/17/2007  . OTITIS EXTERNA, ACUTE, LEFT 12/06/2006  . GERD 12/06/2006  . BACK PAIN 12/06/2006    Past Surgical History:  Procedure Laterality Date  . ADENOIDECTOMY    . CHOLECYSTECTOMY N/A 01/05/2014   Procedure: LAPAROSCOPIC CHOLECYSTECTOMY WITH INTRAOPERATIVE CHOLANGIOGRAM;  Surgeon: Edward Jolly, MD;  Location: [facility];  Service: General;  Laterality: N/A;  .  ESOPHAGOGASTRODUODENOSCOPY (EGD) WITH PROPOFOL N/A 06/18/2014   Procedure: ESOPHAGOGASTRODUODENOSCOPY (EGD) WITH PROPOFOL;  Surgeon: Wonda Horner, MD;  Location: [facility] ENDOSCOPY;  Service: Endoscopy;  Laterality: N/A;  . TEAR DUCT PROBING    . TONSILLECTOMY    . WISDOM TOOTH EXTRACTION      OB History    Gravida Para Term Preterm AB Living   1 1 1     1    SAB TAB Ectopic Multiple Live Births           1     Home Medications    Prior to Admission medications   Medication Sig  Start Date End Date Taking? Authorizing Provider  butalbital-acetaminophen-caffeine (FIORICET) [id number] MG tablet Take 1-2 tablets by mouth every 6 (six) hours as needed for headache. 02/16/16 02/15/17  Shawn C Joy, PA-C  fluconazole (DIFLUCAN) 150 MG tablet Take one tablet in 72 hours if your symptoms persist. 05/11/16   Gloriann Loan, PA-C  HYDROcodone-acetaminophen (NORCO) 5-325 MG tablet Take 2 tablets by mouth every 4 (four) hours as needed. 06/08/16   Margarita Mail, PA-C  ibuprofen (ADVIL,MOTRIN) 800 MG tablet Take 1 tablet (800 mg total) by mouth every 8 (eight) hours as needed for mild pain or moderate pain. 06/17/16   Clayton Bibles, PA-C  methocarbamol (ROBAXIN) 500 MG tablet Take 1 tablet (500 mg total) by mouth 2 (two) times daily. 05/11/16   Gloriann Loan, PA-C  metoCLOPramide (REGLAN) 10 MG tablet Take 1 tablet (10 mg total) by mouth every 6 (six) hours. 05/11/16   Gloriann Loan, PA-C  metroNIDAZOLE (FLAGYL) 500 MG tablet Take 1 tablet (500 mg total) by mouth 2 (two) times daily. One po bid x 7 days 06/08/16   Margarita Mail, PA-C  naproxen (NAPROSYN) 500 MG tablet Take 1 tablet (500 mg total) by mouth 2 (two) times daily. 05/11/16   Gloriann Loan, PA-C  Norgestimate-Ethinyl Estradiol Triphasic (TRI-PREVIFEM) 0.18/0.215/0.25 MG-35 MCG tablet Take 1 tablet by mouth daily.    Historical Provider, MD  ondansetron (ZOFRAN ODT) 4 MG disintegrating tablet Take 1 tablet (4 mg total) by mouth every 8 (eight) hours as needed for nausea or vomiting. 02/16/16   Shawn C Joy, PA-C  ondansetron (ZOFRAN) 4 MG tablet Take 1 tablet (4 mg total) by mouth every 8 (eight) hours as needed for nausea or vomiting. 06/08/16   Margarita Mail, PA-C    Family History Family History  Problem Relation Age of Onset  . Diabetes    . Hypertension    . Lung cancer    . Bipolar disorder Sister     Social History Social History  Substance Use Topics  . Smoking status: Current Every Day Smoker    Packs/day: 0.50    Years: 8.00     Types: Cigarettes  . Smokeless tobacco: Never Used  . Alcohol use No     Allergies   Patient has no known allergies.   Review of Systems Review of Systems  Musculoskeletal: Positive for arthralgias.  Neurological: Negative for syncope, weakness and numbness.   Physical Exam Updated Vital Signs BP 114/79   Pulse 93   Temp 98.1 F (36.7 C) (Oral)   Resp 16   LMP 06/01/2016   SpO2 100%   Physical Exam  Constitutional: She appears well-developed and well-nourished. No distress.  HENT:  Head: Normocephalic and atraumatic.  Neck: Neck supple.  Pulmonary/Chest: Effort normal.  Musculoskeletal:  Right knee mild tenderns over lateral aspect, pain with anterior drawer, no laxity of joint. No pain  with stress in any other direction. No ertyhenma, edema or warmth. distral pulses and sensation intact. Calf nontender. Compartments soft. She bears weight on leg limping.   Neurological: She is alert.  Skin: She is not diaphoretic.  Nursing note and vitals reviewed.  ED Treatments / Results  Labs (all labs ordered are listed, but only abnormal results are displayed) Labs Reviewed - No data to display  EKG  EKG Interpretation None       Radiology Dg Knee Complete 4 Views Right  Result Date: 06/17/2016 CLINICAL DATA:  Pain following fall EXAM: RIGHT KNEE - COMPLETE 4+ VIEW COMPARISON:  Oct 20, 2014 FINDINGS: Frontal, lateral, and bilateral oblique views were obtained. There is no fracture or dislocation. There is a small joint effusion. The joint spaces appear normal. No erosive change. There is a stable unfused apophysis along the anterior proximal tibia. IMPRESSION: Small joint effusion evident. No fracture or dislocation. No appreciable arthropathy. Electronically Signed   By: Lowella Grip III M.D.   On: 06/17/2016 11:45   Procedures Procedures (including critical care time)  Medications Ordered in ED Medications  ibuprofen (ADVIL,MOTRIN) tablet 800 mg (800 mg Oral Given  06/17/16 1158)    DIAGNOSTIC STUDIES:  Oxygen Saturation is 100% on RA, normal by my interpretation.    COORDINATION OF CARE:  11:29 AM Discussed treatment Massey with pt at bedside and pt agreed to Massey.  Initial Impression / Assessment and Massey / ED Course  I have reviewed the triage vital signs and the nursing notes.  Pertinent labs & imaging results that were available during my care of the patient were reviewed by me and considered in my medical decision making (see chart for details).  Clinical Course     Afebrile, nontoxic patient with injury to her right knee while attempting to stop while skating, twisting her knee then fell.   Xray demonstrates small effusion, no fracture.  Placed in knee immobilizer, crutches.  Pt concerned that without insurance she will not be able to see [employer] (her orthopedist).  Will give PCP and sports medicine options for follow up but encouraged ortho follow up.   D/C home.  Discussed result, findings, treatment, and follow up  with patient.  Pt given return precautions.  Pt verbalizes understanding and agrees with Massey.      I personally performed the services described in this documentation, which was scribed in my presence. The recorded information has been reviewed and is accurate.   Final Clinical Impressions(s) / ED Diagnoses   Final diagnoses:  Injury of right knee, initial encounter    New Prescriptions Discharge Medication List as of 06/17/2016 12:25 PM    START taking these medications   Details  ibuprofen (ADVIL,MOTRIN) 800 MG tablet Take 1 tablet (800 mg total) by mouth every 8 (eight) hours as needed for mild pain or moderate pain., Starting Thu 06/17/2016, Print         [place], PA-C 06/17/16 1306    Gareth Morgan, MD 06/19/16 1149

## 2016-06-17 NOTE — ED Triage Notes (Signed)
Pt reports R knee pain after she fell on her knees while skating a few days ago. Pt ambulatory. Pt also complains of ongoing pain from [facility] (?) bump on R knee. Pt was told it would go away after she turned 18, but still has symptoms now.

## 2016-06-17 NOTE — Discharge Instructions (Signed)
Read the information below.  Use the prescribed medication as directed.  Please discuss all new medications with your pharmacist.  You may return to the Emergency Department at any time for worsening condition or any new symptoms that concern you.  If you develop uncontrolled pain, weakness or numbness of the extremity, severe discoloration of the skin, or you are unable to walk or move your leg, return to the ER for a recheck.

## 2016-06-17 NOTE — ED Notes (Signed)
Bed: WTR6 Expected date:  Expected time:  Means of arrival:  Comments: 

## 2016-06-24 MED FILL — NORG-EE 0.18-0.215-0.25/0.0: 0.18/0.215/ | 28 days supply | Qty: 28 | Fill #3

## 2016-06-25 ENCOUNTER — Encounter (HOSPITAL_COMMUNITY): Payer: Self-pay | Admitting: Emergency Medicine

## 2016-06-25 ENCOUNTER — Emergency Department (HOSPITAL_COMMUNITY)
Admission: EM | Admit: 2016-06-25 | Discharge: 2016-06-25 | Disposition: A | Discharge status: Home / Self Care | Payer: Self-pay | Attending: Emergency Medicine | Admitting: Emergency Medicine

## 2016-06-25 DIAGNOSIS — R110 Nausea: Secondary | ICD-10-CM

## 2016-06-25 DIAGNOSIS — R1031 Right lower quadrant pain: Secondary | ICD-10-CM

## 2016-06-25 DIAGNOSIS — Z79899 Other long term (current) drug therapy: Secondary | ICD-10-CM | POA: Insufficient documentation

## 2016-06-25 DIAGNOSIS — F909 Attention-deficit hyperactivity disorder, unspecified type: Secondary | ICD-10-CM | POA: Insufficient documentation

## 2016-06-25 DIAGNOSIS — F17210 Nicotine dependence, cigarettes, uncomplicated: Secondary | ICD-10-CM | POA: Insufficient documentation

## 2016-06-25 LAB — COMPREHENSIVE METABOLIC PANEL
ALBUMIN: 3.6 g/dL (ref 3.5–5.0)
ALT: 19 U/L (ref 14–54)
AST: 19 U/L (ref 15–41)
Alkaline Phosphatase: 41 U/L (ref 38–126)
Anion gap: 5 (ref 5–15)
BILIRUBIN TOTAL: 0.3 mg/dL (ref 0.3–1.2)
BUN: 10 mg/dL (ref 6–20)
CHLORIDE: 106 mmol/L (ref 101–111)
CO2: 27 mmol/L (ref 22–32)
CREATININE: 0.68 mg/dL (ref 0.44–1.00)
Calcium: 8.8 mg/dL — ABNORMAL LOW (ref 8.9–10.3)
GFR calc Af Amer: >60 mL/min (ref >60)
GFR calc non Af Amer: >60 mL/min (ref >60)
GLUCOSE: 108 mg/dL — AB (ref 65–99)
POTASSIUM: 4.3 mmol/L (ref 3.5–5.1)
Sodium: 138 mmol/L (ref 135–145)
Total Protein: 6.1 g/dL — ABNORMAL LOW (ref 6.5–8.1)

## 2016-06-25 LAB — URINALYSIS, ROUTINE W REFLEX MICROSCOPIC
Bilirubin Urine: NEGATIVE
Glucose, UA: NEGATIVE mg/dL
Ketones, ur: NEGATIVE mg/dL
Leukocytes, UA: NEGATIVE
Nitrite: NEGATIVE
Protein, ur: NEGATIVE mg/dL
SPECIFIC GRAVITY, URINE: 1.013 (ref 1.005–1.030)
pH: 6 (ref 5.0–8.0)

## 2016-06-25 LAB — CBC
HEMATOCRIT: 40.9 % (ref 36.0–46.0)
Hemoglobin: 13.1 g/dL (ref 12.0–15.0)
MCH: 28.2 pg (ref 26.0–34.0)
MCHC: 32 g/dL (ref 30.0–36.0)
MCV: 88 fL (ref 78.0–100.0)
PLATELETS: 262 10*3/uL (ref 150–400)
RBC: 4.65 MIL/uL (ref 3.87–5.11)
RDW: 13.6 % (ref 11.5–15.5)
WBC: 17.2 10*3/uL — ABNORMAL HIGH (ref 4.0–10.5)

## 2016-06-25 LAB — I-STAT BETA HCG BLOOD, ED (MC, WL, AP ONLY)

## 2016-06-25 LAB — LIPASE, BLOOD: LIPASE: 29 U/L (ref 11–51)

## 2016-06-25 MED ORDER — MORPHINE SULFATE (PF) 4 MG/ML IV SOLN
4.0000 mg | Freq: Once | INTRAVENOUS | Status: AC
  Administered 2016-06-25: 4 mg via INTRAVENOUS
  Filled 2016-06-25: qty 1

## 2016-06-25 MED ORDER — SODIUM CHLORIDE 0.9 % IV BOLUS (SEPSIS)
1000.0000 mL | Freq: Once | INTRAVENOUS | Status: AC
  Administered 2016-06-25: 1000 mL via INTRAVENOUS

## 2016-06-25 MED ORDER — ONDANSETRON HCL 4 MG/2ML IJ SOLN
4.0000 mg | Freq: Once | INTRAMUSCULAR | Status: AC
  Administered 2016-06-25: 4 mg via INTRAVENOUS
  Filled 2016-06-25: qty 2

## 2016-06-25 MED ORDER — METOCLOPRAMIDE HCL 5 MG/ML IJ SOLN
10.0000 mg | Freq: Once | INTRAMUSCULAR | Status: AC
  Administered 2016-06-25: 10 mg via INTRAVENOUS
  Filled 2016-06-25: qty 2

## 2016-06-25 MED ORDER — KETOROLAC TROMETHAMINE 30 MG/ML IJ SOLN
30.0000 mg | Freq: Once | INTRAMUSCULAR | Status: AC
  Administered 2016-06-25: 30 mg via INTRAVENOUS
  Filled 2016-06-25: qty 1

## 2016-06-25 NOTE — Discharge Instructions (Signed)
Please continue to take tylenol and ibuprofen for pain control  Return without fail for worsening symptoms, including fever, intractable vomiting, worsening pain, or any other symptoms concerning to you.

## 2016-06-25 NOTE — ED Triage Notes (Signed)
Pt complaint of right abdominal pain with associated nausea onset this morning with awakening. Pt denies emesis, diarrhea, or GU symptoms.

## 2016-06-25 NOTE — ED Provider Notes (Signed)
[facility] DEPT Provider Note   CSN: [id number] Arrival date & time: 06/25/16  0843     History   Chief Complaint Chief Complaint  Patient presents with  . Abdominal Pain  . Nausea    HPI [name] is a 31 y.o. female.  HPI 31 year old female who presents with abdominal pain and nausea. States onset of sharp constant right lower quadrant abdominal pain since this morning associated with nausea. Denies any associating vomiting, diarrhea, dysuria, urinary frequency, hematuria, flank pain, abnormal vaginal bleeding or discharge. Currently on her menses which started 5 days ago. States that she was here 2 weeks ago for evaluation of the exact same symptoms. She had a CT abdomen and pelvis that was unremarkable and ultrasound of pelvis with Doppler that was unremarkable. She has a history of cholecystitis, nephrolithiasis, chronic back pain, interstitial cystitis.    Past Medical History:  Diagnosis Date  . ADD (attention deficit disorder)   . Anxiety   . Anxiety disorder   . Back pain   . Barrett esophagus   . Basedow's disease   . Bipolar disorder ([place])   . Chronic back pain   . Fibroid   . Gastric reflux   . History of nephrolithiasis   . Interstitial cystitis   . Migraine   . Pyelonephritis     Patient Active Problem List   Diagnosis Date Noted  . Liver lesion 09/15/2015  . Dysuria 12/10/2014  . Cellulitis 10/02/2014  . Abdominal pain 06/17/2014  . Tobacco abuse 06/17/2014  . Vomiting 06/16/2014  . Back pain 05/21/2014  . Neck pain 05/21/2014  . Thoracic back pain 05/21/2014  . ADD (attention deficit disorder) 04/01/2014  . Eye pressure 03/14/2014  . Blurred vision, left eye 03/14/2014  . UTI (urinary tract infection) 02/28/2014  . Gallstones 01/05/2014  . Cholecystitis 01/05/2014  . Biliary dyskinesia 01/01/2014  . SVD (spontaneous vaginal delivery) 08/19/2013  . Indication for care in labor or delivery 08/18/2013  . Tick bite 02/28/2012  .  Abscess 02/28/2012  . Boil 10/26/2011  . Bipolar 1 disorder ([place]) 10/15/2011  . Head injury 09/28/2010  . CRUSHING INJURY OF HAND 02/10/2010  . EYE PAIN, LEFT 12/04/2009  . MOOD SWINGS 11/28/2009  . ARTHRITIS, BACK 10/02/2009  . ANXIETY STATE, UNSPECIFIED 09/23/2009  . KNEE PAIN, RIGHT 02/21/2009  . ANKLE PAIN, RIGHT 02/21/2009  . NAUSEA AND VOMITING 10/16/2008  . NEPHROLITHIASIS, HX OF 05/28/2008  . GASTROINTESTINAL XRAY, ABNORMAL 04/04/2008  . SYMPTOM, PAIN, ABDOMINAL, RIGHT UP QUADRANT 03/08/2007  . FLANK PAIN, RIGHT 03/08/2007  . BASEDOW'S DISEASE 03/06/2007  . PYELONEPHRITIS NOS 02/17/2007  . FATIGUE 02/17/2007  . OTITIS EXTERNA, ACUTE, LEFT 12/06/2006  . GERD 12/06/2006  . BACK PAIN 12/06/2006    Past Surgical History:  Procedure Laterality Date  . ADENOIDECTOMY    . CHOLECYSTECTOMY N/A 01/05/2014   Procedure: LAPAROSCOPIC CHOLECYSTECTOMY WITH INTRAOPERATIVE CHOLANGIOGRAM;  Surgeon: Edward Jolly, MD;  Location: [facility];  Service: General;  Laterality: N/A;  . ESOPHAGOGASTRODUODENOSCOPY (EGD) WITH PROPOFOL N/A 06/18/2014   Procedure: ESOPHAGOGASTRODUODENOSCOPY (EGD) WITH PROPOFOL;  Surgeon: Wonda Horner, MD;  Location: [facility] ENDOSCOPY;  Service: Endoscopy;  Laterality: N/A;  . TEAR DUCT PROBING    . TONSILLECTOMY    . WISDOM TOOTH EXTRACTION      OB History    Gravida Para Term Preterm AB Living   1 1 1     1    SAB TAB Ectopic Multiple Live Births  1       Home Medications    Prior to Admission medications   Medication Sig Start Date End Date Taking? Authorizing Provider  ibuprofen (ADVIL,MOTRIN) 800 MG tablet Take 1 tablet (800 mg total) by mouth every 8 (eight) hours as needed for mild pain or moderate pain. 06/17/16  Yes Clayton Bibles, PA-C  meloxicam (MOBIC) 7.5 MG tablet take 1 tablet by mouth twice a day as needed for pain 05/31/16  Yes Historical Provider, MD  Norgestimate-Ethinyl Estradiol Triphasic (TRI-PREVIFEM) 0.18/0.215/0.25 MG-35 MCG tablet  Take 1 tablet by mouth daily.   Yes Historical Provider, MD  butalbital-acetaminophen-caffeine (FIORICET) [phone] MG tablet Take 1-2 tablets by mouth every 6 (six) hours as needed for headache. 02/16/16 02/15/17  Shawn C Joy, PA-C  fluconazole (DIFLUCAN) 150 MG tablet Take one tablet in 72 hours if your symptoms persist. Patient not taking: Reported on 06/25/2016 05/11/16   Gloriann Loan, PA-C  HYDROcodone-acetaminophen [facility]) 10-325 MG tablet take 1 tablet by mouth twice a day if needed for pain 05/31/16   Historical Provider, MD  HYDROcodone-acetaminophen (NORCO) 5-325 MG tablet Take 2 tablets by mouth every 4 (four) hours as needed. Patient not taking: Reported on 06/25/2016 06/08/16   Margarita Mail, PA-C  methocarbamol (ROBAXIN) 500 MG tablet Take 1 tablet (500 mg total) by mouth 2 (two) times daily. Patient not taking: Reported on 06/25/2016 05/11/16   Gloriann Loan, PA-C  metoCLOPramide (REGLAN) 10 MG tablet Take 1 tablet (10 mg total) by mouth every 6 (six) hours. 05/11/16   Gloriann Loan, PA-C  metroNIDAZOLE (FLAGYL) 500 MG tablet Take 1 tablet (500 mg total) by mouth 2 (two) times daily. One po bid x 7 days Patient not taking: Reported on 06/25/2016 06/08/16   Margarita Mail, PA-C  naproxen (NAPROSYN) 500 MG tablet Take 1 tablet (500 mg total) by mouth 2 (two) times daily. Patient not taking: Reported on 06/25/2016 05/11/16   Gloriann Loan, PA-C  ondansetron (ZOFRAN ODT) 4 MG disintegrating tablet Take 1 tablet (4 mg total) by mouth every 8 (eight) hours as needed for nausea or vomiting. 02/16/16   Shawn C Joy, PA-C  ondansetron (ZOFRAN) 4 MG tablet Take 1 tablet (4 mg total) by mouth every 8 (eight) hours as needed for nausea or vomiting. Patient not taking: Reported on 06/25/2016 06/08/16   Margarita Mail, PA-C    Family History Family History  Problem Relation Age of Onset  . Diabetes    . Hypertension    . Lung cancer    . Bipolar disorder Sister     Social History Social History  Substance  Use Topics  . Smoking status: Current Every Day Smoker    Packs/day: 0.50    Years: 8.00    Types: Cigarettes  . Smokeless tobacco: Never Used  . Alcohol use No     Allergies   Patient has no known allergies.   Review of Systems Review of Systems 10/14 systems reviewed and are negative other than those stated in the HPI   Physical Exam Updated Vital Signs BP 109/70   Pulse 86   Temp 98 F (36.7 C) (Oral)   Resp 16   Ht 5\' 6"  (1.676 m)   Wt 140 lb (63.5 kg)   LMP 06/19/2016   SpO2 97%   BMI 22.60 kg/m   Physical Exam Physical Exam  Nursing note and vitals reviewed. Constitutional: Well developed, well nourished, non-toxic, and in no acute distress Head: Normocephalic and atraumatic.  Mouth/Throat: Oropharynx is clear and  moist.  Neck: Normal range of motion. Neck supple.  Cardiovascular: Normal rate and regular rhythm.   Pulmonary/Chest: Effort normal and breath sounds normal.  Abdominal: Soft. There is RLQ tenderness. No adnexal tenderness. There is no rebound and no guarding.  Musculoskeletal: Normal range of motion.  Neurological: Alert, no facial droop, fluent speech, moves all extremities symmetrically Skin: Skin is warm and dry.  Psychiatric: Cooperative   ED Treatments / Results  Labs (all labs ordered are listed, but only abnormal results are displayed) Labs Reviewed  COMPREHENSIVE METABOLIC PANEL - Abnormal; Notable for the following:       Result Value   Glucose, Bld 108 (*)    Calcium 8.8 (*)    Total Protein 6.1 (*)    All other components within normal limits  CBC - Abnormal; Notable for the following:    WBC 17.2 (*)    All other components within normal limits  URINALYSIS, ROUTINE W REFLEX MICROSCOPIC - Abnormal; Notable for the following:    Hgb urine dipstick SMALL (*)    Bacteria, UA RARE (*)    Squamous Epithelial / LPF 0-5 (*)    All other components within normal limits  LIPASE, BLOOD  I-STAT BETA HCG BLOOD, ED ([facility], [facility], [facility] ONLY)      EKG  EKG Interpretation None       Radiology No results found.  Procedures Procedures (including critical care time)  Medications Ordered in ED Medications  ondansetron (ZOFRAN) injection 4 mg (4 mg Intravenous Given 06/25/16 1246)  morphine 4 MG/ML injection 4 mg (4 mg Intravenous Given 06/25/16 1246)  sodium chloride 0.9 % bolus 1,000 mL (0 mLs Intravenous Stopped 06/25/16 1434)  ondansetron (ZOFRAN) injection 4 mg (4 mg Intravenous Given 06/25/16 1356)  sodium chloride 0.9 % bolus 1,000 mL (0 mLs Intravenous Stopped 06/25/16 1711)  metoCLOPramide (REGLAN) injection 10 mg (10 mg Intravenous Given 06/25/16 1555)  ketorolac (TORADOL) 30 MG/ML injection 30 mg (30 mg Intravenous Given 06/25/16 1555)     Initial Impression / Assessment and Plan / ED Course  I have reviewed the triage vital signs and the nursing notes.  Pertinent labs & imaging results that were available during my care of the patient were reviewed by me and considered in my medical decision making (see chart for details).  Clinical Course     Old records are reviewed. She was seen 06/08/2017 for right lower quadrant abdominal pain. Head CT abdomen and pelvis and pelvic ultrasound that were overall unremarkable without clear etiology of pain. Over the past year she has had 4-5 CT abdomen pelvis for evaluation of right lower quadrant abdominal pain and right flank pain. They have all been negative for acute intra-abdominal or retroperitoneal etiology.  Her abdomen is soft and non-peritoneal today. With focal RLQ tenderness to presentation. She states this is the same pain she has felt before 2 weeks ago and prior. I feel like this makes appendicitis, ovarian torsion, or other serious intraabdominal processes less likely given her frequent imaging. UA w/o infection.   She has low BP 80-90s briefly, but on chair review, she has had low BPs in 80-90s in the past. Given IVF, analgesics, and antiemetics. She feels improved.  Tolerating PO. Will continue supportive care at home. Discussed strict return instructions. Given GI referral for additional work-up but states she is still working on her insurance coverage before outpatient follow-up.  Final Clinical Impressions(s) / ED Diagnoses   Final diagnoses:  Right lower quadrant abdominal pain  Nausea    New Prescriptions New Prescriptions   No medications on file     Forde Dandy, MD 06/25/16 1755

## 2016-06-25 NOTE — ED Notes (Signed)
Pt took a sip of sprite. Has not thrown up, but still feels nauseated

## 2016-07-18 ENCOUNTER — Encounter (HOSPITAL_BASED_OUTPATIENT_CLINIC_OR_DEPARTMENT_OTHER): Payer: Self-pay | Admitting: Emergency Medicine

## 2016-07-18 ENCOUNTER — Emergency Department (HOSPITAL_BASED_OUTPATIENT_CLINIC_OR_DEPARTMENT_OTHER)
Admission: EM | Admit: 2016-07-18 | Discharge: 2016-07-18 | Disposition: A | Discharge status: Home / Self Care | Payer: Self-pay | Attending: Emergency Medicine | Admitting: Emergency Medicine

## 2016-07-18 DIAGNOSIS — J069 Acute upper respiratory infection, unspecified: Secondary | ICD-10-CM | POA: Insufficient documentation

## 2016-07-18 DIAGNOSIS — F17210 Nicotine dependence, cigarettes, uncomplicated: Secondary | ICD-10-CM | POA: Insufficient documentation

## 2016-07-18 DIAGNOSIS — B9789 Other viral agents as the cause of diseases classified elsewhere: Secondary | ICD-10-CM

## 2016-07-18 DIAGNOSIS — Z79899 Other long term (current) drug therapy: Secondary | ICD-10-CM | POA: Insufficient documentation

## 2016-07-18 DIAGNOSIS — R300 Dysuria: Secondary | ICD-10-CM | POA: Insufficient documentation

## 2016-07-18 LAB — URINALYSIS, ROUTINE W REFLEX MICROSCOPIC
BILIRUBIN URINE: NEGATIVE
Glucose, UA: NEGATIVE mg/dL
Ketones, ur: NEGATIVE mg/dL
Leukocytes, UA: NEGATIVE
NITRITE: NEGATIVE
Protein, ur: NEGATIVE mg/dL
SPECIFIC GRAVITY, URINE: 1.007 (ref 1.005–1.030)
pH: 7 (ref 5.0–8.0)

## 2016-07-18 LAB — URINALYSIS, MICROSCOPIC (REFLEX)

## 2016-07-18 LAB — PREGNANCY, URINE: PREG TEST UR: NEGATIVE

## 2016-07-18 NOTE — Discharge Instructions (Signed)
Tylenol 1000 mg rotated with ibuprofen 600 mg every 4 hours as needed for pain or fever.  Over-the-counter cough and cold medications as needed for symptom relief.  Drink fluids and get plenty of rest.  Return to the emergency department if your symptoms significantly worsen or change.

## 2016-07-18 NOTE — ED Triage Notes (Signed)
Cough, sore throat, dysuria since Friday.

## 2016-07-18 NOTE — ED Provider Notes (Signed)
[place] DEPT [facility] Provider Note   CSN: [id number] Arrival date & time: 07/18/16  1019     History   Chief Complaint Chief Complaint  Patient presents with  . Cough  . Dysuria    HPI [name] is a 31 y.o. female.  Patient is a 31 year old female with history of UTIs, anxiety, GERD. She presents for evaluation of URI symptoms for the past several days. She reports decreased appetite and thirst and has been taking in less fluids. She is not having burning with urination and pain in her back which are consistent with her prior UTI symptoms. She denies any fevers or chills. She denies any difficulty breathing or abdominal pain.   The history is provided by the patient.  Cough  This is a new problem. The current episode started yesterday. The problem occurs constantly. The problem has been gradually worsening. The cough is non-productive. There has been no fever. Pertinent negatives include no chills and no shortness of breath. She has tried nothing for the symptoms.  Dysuria   Pertinent negatives include no chills.    Past Medical History:  Diagnosis Date  . ADD (attention deficit disorder)   . Anxiety   . Anxiety disorder   . Back pain   . Barrett esophagus   . Basedow's disease   . Bipolar disorder ([place])   . Chronic back pain   . Fibroid   . Gastric reflux   . History of nephrolithiasis   . Interstitial cystitis   . Migraine   . Pyelonephritis     Patient Active Problem List   Diagnosis Date Noted  . Liver lesion 09/15/2015  . Dysuria 12/10/2014  . Cellulitis 10/02/2014  . Abdominal pain 06/17/2014  . Tobacco abuse 06/17/2014  . Vomiting 06/16/2014  . Back pain 05/21/2014  . Neck pain 05/21/2014  . Thoracic back pain 05/21/2014  . ADD (attention deficit disorder) 04/01/2014  . Eye pressure 03/14/2014  . Blurred vision, left eye 03/14/2014  . UTI (urinary tract infection) 02/28/2014  . Gallstones 01/05/2014  . Cholecystitis 01/05/2014  .  Biliary dyskinesia 01/01/2014  . SVD (spontaneous vaginal delivery) 08/19/2013  . Indication for care in labor or delivery 08/18/2013  . Tick bite 02/28/2012  . Abscess 02/28/2012  . Boil 10/26/2011  . Bipolar 1 disorder ([place]) 10/15/2011  . Head injury 09/28/2010  . CRUSHING INJURY OF HAND 02/10/2010  . EYE PAIN, LEFT 12/04/2009  . MOOD SWINGS 11/28/2009  . ARTHRITIS, BACK 10/02/2009  . ANXIETY STATE, UNSPECIFIED 09/23/2009  . KNEE PAIN, RIGHT 02/21/2009  . ANKLE PAIN, RIGHT 02/21/2009  . NAUSEA AND VOMITING 10/16/2008  . NEPHROLITHIASIS, HX OF 05/28/2008  . GASTROINTESTINAL XRAY, ABNORMAL 04/04/2008  . SYMPTOM, PAIN, ABDOMINAL, RIGHT UP QUADRANT 03/08/2007  . FLANK PAIN, RIGHT 03/08/2007  . BASEDOW'S DISEASE 03/06/2007  . PYELONEPHRITIS NOS 02/17/2007  . FATIGUE 02/17/2007  . OTITIS EXTERNA, ACUTE, LEFT 12/06/2006  . GERD 12/06/2006  . BACK PAIN 12/06/2006    Past Surgical History:  Procedure Laterality Date  . ADENOIDECTOMY    . CHOLECYSTECTOMY N/A 01/05/2014   Procedure: LAPAROSCOPIC CHOLECYSTECTOMY WITH INTRAOPERATIVE CHOLANGIOGRAM;  Surgeon: Edward Jolly, MD;  Location: [facility];  Service: General;  Laterality: N/A;  . ESOPHAGOGASTRODUODENOSCOPY (EGD) WITH PROPOFOL N/A 06/18/2014   Procedure: ESOPHAGOGASTRODUODENOSCOPY (EGD) WITH PROPOFOL;  Surgeon: Wonda Horner, MD;  Location: [facility] ENDOSCOPY;  Service: Endoscopy;  Laterality: N/A;  . TEAR DUCT PROBING    . TONSILLECTOMY    . WISDOM TOOTH EXTRACTION  OB History    Gravida Para Term Preterm AB Living   1 1 1     1    SAB TAB Ectopic Multiple Live Births           1       Home Medications    Prior to Admission medications   Medication Sig Start Date End Date Taking? Authorizing Provider  butalbital-acetaminophen-caffeine (FIORICET) 50-325-40 MG tablet Take 1-2 tablets by mouth every 6 (six) hours as needed for headache. 02/16/16 02/15/17  Shawn C Joy, PA-C  fluconazole (DIFLUCAN) 150 MG tablet Take one tablet  in 72 hours if your symptoms persist. Patient not taking: Reported on 06/25/2016 05/11/16   Gloriann Loan, PA-C  HYDROcodone-acetaminophen [facility]) 10-325 MG tablet take 1 tablet by mouth twice a day if needed for pain 05/31/16   Historical Provider, MD  HYDROcodone-acetaminophen (NORCO) 5-325 MG tablet Take 2 tablets by mouth every 4 (four) hours as needed. Patient not taking: Reported on 06/25/2016 06/08/16   Margarita Mail, PA-C  ibuprofen (ADVIL,MOTRIN) 800 MG tablet Take 1 tablet (800 mg total) by mouth every 8 (eight) hours as needed for mild pain or moderate pain. 06/17/16   Clayton Bibles, PA-C  meloxicam (MOBIC) 7.5 MG tablet take 1 tablet by mouth twice a day as needed for pain 05/31/16   Historical Provider, MD  methocarbamol (ROBAXIN) 500 MG tablet Take 1 tablet (500 mg total) by mouth 2 (two) times daily. Patient not taking: Reported on 06/25/2016 05/11/16   Gloriann Loan, PA-C  metoCLOPramide (REGLAN) 10 MG tablet Take 1 tablet (10 mg total) by mouth every 6 (six) hours. 05/11/16   Gloriann Loan, PA-C  metroNIDAZOLE (FLAGYL) 500 MG tablet Take 1 tablet (500 mg total) by mouth 2 (two) times daily. One po bid x 7 days Patient not taking: Reported on 06/25/2016 06/08/16   Margarita Mail, PA-C  naproxen (NAPROSYN) 500 MG tablet Take 1 tablet (500 mg total) by mouth 2 (two) times daily. Patient not taking: Reported on 06/25/2016 05/11/16   Gloriann Loan, PA-C  Norgestimate-Ethinyl Estradiol Triphasic (TRI-PREVIFEM) 0.18/0.215/0.25 MG-35 MCG tablet Take 1 tablet by mouth daily.    Historical Provider, MD  ondansetron (ZOFRAN ODT) 4 MG disintegrating tablet Take 1 tablet (4 mg total) by mouth every 8 (eight) hours as needed for nausea or vomiting. 02/16/16   Shawn C Joy, PA-C  ondansetron (ZOFRAN) 4 MG tablet Take 1 tablet (4 mg total) by mouth every 8 (eight) hours as needed for nausea or vomiting. Patient not taking: Reported on 06/25/2016 06/08/16   Margarita Mail, PA-C    Family History Family History  Problem  Relation Age of Onset  . Diabetes    . Hypertension    . Lung cancer    . Bipolar disorder Sister     Social History Social History  Substance Use Topics  . Smoking status: Current Every Day Smoker    Packs/day: 0.50    Years: 8.00    Types: Cigarettes  . Smokeless tobacco: Never Used  . Alcohol use No     Allergies   Patient has no known allergies.   Review of Systems Review of Systems  Constitutional: Negative for chills.  Respiratory: Positive for cough. Negative for shortness of breath.   Genitourinary: Positive for dysuria.  All other systems reviewed and are negative.    Physical Exam Updated Vital Signs BP 117/82 (BP Location: Right Arm)   Pulse 86   Temp 97.7 F (36.5 C) (Oral)   Resp 18  LMP 07/16/2016   SpO2 100%   Physical Exam  Constitutional: She is oriented to person, place, and time. She appears well-developed and well-nourished. No distress.  HENT:  Head: Normocephalic and atraumatic.  Mouth/Throat: Oropharynx is clear and moist.  Neck: Normal range of motion. Neck supple.  Cardiovascular: Normal rate and regular rhythm.  Exam reveals no gallop and no friction rub.   No murmur heard. Pulmonary/Chest: Effort normal and breath sounds normal. No respiratory distress. She has no wheezes.  Abdominal: Soft. Bowel sounds are normal. She exhibits no distension. There is no tenderness.  Musculoskeletal: Normal range of motion.  Neurological: She is alert and oriented to person, place, and time.  Skin: Skin is warm and dry. She is not diaphoretic.  Nursing note and vitals reviewed.    ED Treatments / Results  Labs (all labs ordered are listed, but only abnormal results are displayed) Labs Reviewed  URINALYSIS, ROUTINE W REFLEX MICROSCOPIC  PREGNANCY, URINE    EKG  EKG Interpretation None       Radiology No results found.  Procedures Procedures (including critical care time)  Medications Ordered in ED Medications - No data to  display   Initial Impression / Assessment and Plan / ED Course  I have reviewed the triage vital signs and the nursing notes.  Pertinent labs & imaging results that were available during my care of the patient were reviewed by me and considered in my medical decision making (see chart for details).  Urinalysis is clear. Patient's URI symptoms are likely viral in nature. I will recommend plenty of fluids, Tylenol, Motrin, when necessary return for any problems.  Final Clinical Impressions(s) / ED Diagnoses   Final diagnoses:  None    New Prescriptions New Prescriptions   No medications on file     Veryl Speak, MD 07/18/16 1103

## 2016-07-22 MED FILL — NORG-EE 0.18-0.215-0.25/0.0: 0.18/0.215/ | 28 days supply | Qty: 28 | Fill #4

## 2016-08-19 MED FILL — NORG-EE 0.18-0.215-0.25/0.0: 0.18/0.215/ | 28 days supply | Qty: 28 | Fill #5

## 2016-09-02 ENCOUNTER — Encounter (HOSPITAL_BASED_OUTPATIENT_CLINIC_OR_DEPARTMENT_OTHER): Payer: Self-pay | Admitting: *Deleted

## 2016-09-02 ENCOUNTER — Emergency Department (HOSPITAL_BASED_OUTPATIENT_CLINIC_OR_DEPARTMENT_OTHER)
Admission: EM | Admit: 2016-09-02 | Discharge: 2016-09-02 | Disposition: A | Discharge status: Home / Self Care | Payer: Self-pay | Attending: Emergency Medicine | Admitting: Emergency Medicine

## 2016-09-02 DIAGNOSIS — Z79899 Other long term (current) drug therapy: Secondary | ICD-10-CM | POA: Insufficient documentation

## 2016-09-02 DIAGNOSIS — R300 Dysuria: Secondary | ICD-10-CM | POA: Insufficient documentation

## 2016-09-02 DIAGNOSIS — F17210 Nicotine dependence, cigarettes, uncomplicated: Secondary | ICD-10-CM | POA: Insufficient documentation

## 2016-09-02 LAB — URINALYSIS, ROUTINE W REFLEX MICROSCOPIC
Bilirubin Urine: NEGATIVE
Glucose, UA: NEGATIVE mg/dL
Hgb urine dipstick: NEGATIVE
KETONES UR: NEGATIVE mg/dL
Leukocytes, UA: NEGATIVE
NITRITE: NEGATIVE
PH: 5 (ref 5.0–8.0)
PROTEIN: NEGATIVE mg/dL
Specific Gravity, Urine: 1.023 (ref 1.005–1.030)

## 2016-09-02 LAB — PREGNANCY, URINE: Preg Test, Ur: NEGATIVE

## 2016-09-02 NOTE — ED Triage Notes (Signed)
Right flank pain for a week. She feels like she has an UTI.

## 2016-09-02 NOTE — ED Provider Notes (Signed)
[facility] Provider Note   CSN: [id number] Arrival date & time: 09/02/16  1758   By signing my name below, I, Charolotte Eke, attest that this documentation has been prepared under the direction and in the presence of Charlesetta Shanks, MD. Electronically Signed: Charolotte Eke, Scribe. 09/02/16. 8:36 PM.    History   Chief Complaint Chief Complaint  Patient presents with  . Flank Pain    HPI [name] is a 31 y.o. female with h/o interstitial cystitis who presents to the Emergency Department complaining of gradual onset, mild, persistent flank pain that began 7 days ago. Pt has associated frequent urination, emesis x1.She reports she does have interstitial cystitis. She reports she wanted to make sure that she wasn't getting a urinary tract infection. Pt states that she does not have insurance. Pt states that she normally sees Dr. Vikki Ports. Pt is a smoker .5 ppd.    HPI  Past Medical History:  Diagnosis Date  . ADD (attention deficit disorder)   . Anxiety   . Anxiety disorder   . Back pain   . Barrett esophagus   . Basedow's disease   . Bipolar disorder ([place])   . Chronic back pain   . Fibroid   . Gastric reflux   . History of nephrolithiasis   . Interstitial cystitis   . Migraine   . Pyelonephritis     Patient Active Problem List   Diagnosis Date Noted  . Liver lesion 09/15/2015  . Dysuria 12/10/2014  . Cellulitis 10/02/2014  . Abdominal pain 06/17/2014  . Tobacco abuse 06/17/2014  . Vomiting 06/16/2014  . Back pain 05/21/2014  . Neck pain 05/21/2014  . Thoracic back pain 05/21/2014  . ADD (attention deficit disorder) 04/01/2014  . Eye pressure 03/14/2014  . Blurred vision, left eye 03/14/2014  . UTI (urinary tract infection) 02/28/2014  . Gallstones 01/05/2014  . Cholecystitis 01/05/2014  . Biliary dyskinesia 01/01/2014  . SVD (spontaneous vaginal delivery) 08/19/2013  . Indication for care in labor or delivery 08/18/2013  . Tick bite  02/28/2012  . Abscess 02/28/2012  . Boil 10/26/2011  . Bipolar 1 disorder ([place]) 10/15/2011  . Head injury 09/28/2010  . CRUSHING INJURY OF HAND 02/10/2010  . EYE PAIN, LEFT 12/04/2009  . MOOD SWINGS 11/28/2009  . ARTHRITIS, BACK 10/02/2009  . ANXIETY STATE, UNSPECIFIED 09/23/2009  . KNEE PAIN, RIGHT 02/21/2009  . ANKLE PAIN, RIGHT 02/21/2009  . NAUSEA AND VOMITING 10/16/2008  . NEPHROLITHIASIS, HX OF 05/28/2008  . GASTROINTESTINAL XRAY, ABNORMAL 04/04/2008  . SYMPTOM, PAIN, ABDOMINAL, RIGHT UP QUADRANT 03/08/2007  . FLANK PAIN, RIGHT 03/08/2007  . BASEDOW'S DISEASE 03/06/2007  . PYELONEPHRITIS NOS 02/17/2007  . FATIGUE 02/17/2007  . OTITIS EXTERNA, ACUTE, LEFT 12/06/2006  . GERD 12/06/2006  . BACK PAIN 12/06/2006    Past Surgical History:  Procedure Laterality Date  . ADENOIDECTOMY    . CHOLECYSTECTOMY N/A 01/05/2014   Procedure: LAPAROSCOPIC CHOLECYSTECTOMY WITH INTRAOPERATIVE CHOLANGIOGRAM;  Surgeon: Edward Jolly, MD;  Location: [facility];  Service: General;  Laterality: N/A;  . ESOPHAGOGASTRODUODENOSCOPY (EGD) WITH PROPOFOL N/A 06/18/2014   Procedure: ESOPHAGOGASTRODUODENOSCOPY (EGD) WITH PROPOFOL;  Surgeon: Wonda Horner, MD;  Location: [facility] ENDOSCOPY;  Service: Endoscopy;  Laterality: N/A;  . TEAR DUCT PROBING    . TONSILLECTOMY    . WISDOM TOOTH EXTRACTION      OB History    Gravida Para Term Preterm AB Living   1 1 1     1    SAB TAB  Ectopic Multiple Live Births           1       Home Medications    Prior to Admission medications   Medication Sig Start Date End Date Taking? Authorizing Provider  HYDROcodone-acetaminophen (NORCO) 10-325 MG tablet take 1 tablet by mouth twice a day if needed for pain 05/31/16  Yes Historical Provider, MD  meloxicam (MOBIC) 7.5 MG tablet take 1 tablet by mouth twice a day as needed for pain 05/31/16  Yes Historical Provider, MD  Norgestimate-Ethinyl Estradiol Triphasic (TRI-PREVIFEM) 0.18/0.215/0.25 MG-35 MCG tablet Take 1 tablet  by mouth daily.   Yes Historical Provider, MD  butalbital-acetaminophen-caffeine (FIORICET) 50-325-40 MG tablet Take 1-2 tablets by mouth every 6 (six) hours as needed for headache. 02/16/16 02/15/17  Shawn C Joy, PA-C  fluconazole (DIFLUCAN) 150 MG tablet Take one tablet in 72 hours if your symptoms persist. Patient not taking: Reported on 06/25/2016 05/11/16   Gloriann Loan, PA-C  HYDROcodone-acetaminophen (NORCO) 5-325 MG tablet Take 2 tablets by mouth every 4 (four) hours as needed. Patient not taking: Reported on 06/25/2016 06/08/16   Margarita Mail, PA-C  ibuprofen (ADVIL,MOTRIN) 800 MG tablet Take 1 tablet (800 mg total) by mouth every 8 (eight) hours as needed for mild pain or moderate pain. 06/17/16   Clayton Bibles, PA-C  methocarbamol (ROBAXIN) 500 MG tablet Take 1 tablet (500 mg total) by mouth 2 (two) times daily. Patient not taking: Reported on 06/25/2016 05/11/16   Gloriann Loan, PA-C  metoCLOPramide (REGLAN) 10 MG tablet Take 1 tablet (10 mg total) by mouth every 6 (six) hours. 05/11/16   Gloriann Loan, PA-C  metroNIDAZOLE (FLAGYL) 500 MG tablet Take 1 tablet (500 mg total) by mouth 2 (two) times daily. One po bid x 7 days Patient not taking: Reported on 06/25/2016 06/08/16   Margarita Mail, PA-C  naproxen (NAPROSYN) 500 MG tablet Take 1 tablet (500 mg total) by mouth 2 (two) times daily. Patient not taking: Reported on 06/25/2016 05/11/16   Gloriann Loan, PA-C  ondansetron (ZOFRAN ODT) 4 MG disintegrating tablet Take 1 tablet (4 mg total) by mouth every 8 (eight) hours as needed for nausea or vomiting. 02/16/16   Shawn C Joy, PA-C  ondansetron (ZOFRAN) 4 MG tablet Take 1 tablet (4 mg total) by mouth every 8 (eight) hours as needed for nausea or vomiting. Patient not taking: Reported on 06/25/2016 06/08/16   Margarita Mail, PA-C    Family History Family History  Problem Relation Age of Onset  . Diabetes    . Hypertension    . Lung cancer    . Bipolar disorder Sister     Social History Social History    Substance Use Topics  . Smoking status: Current Every Day Smoker    Packs/day: 0.50    Years: 8.00    Types: Cigarettes  . Smokeless tobacco: Never Used  . Alcohol use No     Allergies   Patient has no known allergies.   Review of Systems Review of Systems  A complete 10 system review of systems was obtained and all systems are negative except as noted in the HPI and PMH.   Physical Exam Updated Vital Signs BP 119/81 (BP Location: Right Arm)   Pulse 76   Temp 98.2 F (36.8 C)   Resp 18   Ht 5\' 6"  (1.676 m)   Wt 145 lb (65.8 kg)   LMP 08/19/2016   SpO2 100%   BMI 23.40 kg/m   Physical Exam  Constitutional: She is oriented to person, place, and time. She appears well-developed and well-nourished.  HENT:  Head: Normocephalic and atraumatic.  Cardiovascular: Normal rate, regular rhythm, normal heart sounds and intact distal pulses.  Exam reveals no gallop and no friction rub.   No murmur heard. Pulmonary/Chest: Effort normal and breath sounds normal. She has no wheezes. She has no rales.  Abdominal: Soft. There is no tenderness.  No CVA tenderness. Upper abdomen soft, non-tender.  Musculoskeletal:  Lower extremity exam normal.  Neurological: She is alert and oriented to person, place, and time.  Skin: Skin is warm and dry.  Psychiatric: She has a normal mood and affect.  Nursing note and vitals reviewed.    ED Treatments / Results   DIAGNOSTIC STUDIES: Oxygen Saturation is 99% on room air, normal by my interpretation.    COORDINATION OF CARE: 8:35 PM Discussed treatment plan with pt at bedside and pt agreed to plan, which includes note for work.    Labs (all labs ordered are listed, but only abnormal results are displayed) Labs Reviewed  URINALYSIS, ROUTINE W REFLEX MICROSCOPIC - Abnormal; Notable for the following:       Result Value   Color, Urine AMBER (*)    All other components within normal limits  PREGNANCY, URINE    EKG  EKG  Interpretation None       Radiology No results found.  Procedures Procedures (including critical care time)  Medications Ordered in ED Medications - No data to display   Initial Impression / Assessment and Plan / ED Course  I have reviewed the triage vital signs and the nursing notes.  Pertinent labs & imaging results that were available during my care of the patient were reviewed by me and considered in my medical decision making (see chart for details).     Final Clinical Impressions(s) / ED Diagnoses   Final diagnoses:  Dysuria  Patient is clinically well appearance. Abdomen is nontender and she does not have CVA tenderness. Patient reports main concern was making sure she does not have UTI. Urinalysis is negative. Patient is given resources for outpatient follow-up.  New Prescriptions New Prescriptions   No medications on file       Charlesetta Shanks, MD 09/02/16 2045

## 2016-09-02 NOTE — ED Notes (Signed)
ED Provider at bedside. 

## 2016-09-02 NOTE — ED Notes (Signed)
Pt verbalizes understanding of d/c instructions and denies any further needs at this time. 

## 2016-09-12 ENCOUNTER — Emergency Department (HOSPITAL_BASED_OUTPATIENT_CLINIC_OR_DEPARTMENT_OTHER)
Admission: EM | Admit: 2016-09-12 | Discharge: 2016-09-12 | Disposition: A | Discharge status: Home / Self Care | Payer: Self-pay | Attending: Emergency Medicine | Admitting: Emergency Medicine

## 2016-09-12 ENCOUNTER — Encounter (HOSPITAL_BASED_OUTPATIENT_CLINIC_OR_DEPARTMENT_OTHER): Payer: Self-pay | Admitting: Emergency Medicine

## 2016-09-12 DIAGNOSIS — R110 Nausea: Secondary | ICD-10-CM | POA: Insufficient documentation

## 2016-09-12 DIAGNOSIS — R519 Headache, unspecified: Secondary | ICD-10-CM

## 2016-09-12 DIAGNOSIS — Z79899 Other long term (current) drug therapy: Secondary | ICD-10-CM | POA: Insufficient documentation

## 2016-09-12 DIAGNOSIS — R51 Headache: Secondary | ICD-10-CM | POA: Insufficient documentation

## 2016-09-12 DIAGNOSIS — F17210 Nicotine dependence, cigarettes, uncomplicated: Secondary | ICD-10-CM | POA: Insufficient documentation

## 2016-09-12 DIAGNOSIS — H53149 Visual discomfort, unspecified: Secondary | ICD-10-CM | POA: Insufficient documentation

## 2016-09-12 MED ORDER — KETOROLAC TROMETHAMINE 60 MG/2ML IM SOLN
60.0000 mg | Freq: Once | INTRAMUSCULAR | Status: AC
  Administered 2016-09-12: 60 mg via INTRAMUSCULAR
  Filled 2016-09-12: qty 2

## 2016-09-12 MED ORDER — PROCHLORPERAZINE EDISYLATE 5 MG/ML IJ SOLN
10.0000 mg | Freq: Once | INTRAMUSCULAR | Status: AC
  Administered 2016-09-12: 10 mg via INTRAMUSCULAR
  Filled 2016-09-12: qty 2

## 2016-09-12 MED ORDER — BUTALBITAL-APAP-CAFFEINE 50-325-40 MG PO TABS: 1.0000 | ORAL_TABLET | Freq: Four times a day (QID) | ORAL | 0 refills | Status: DC | PRN

## 2016-09-12 NOTE — ED Triage Notes (Signed)
Headache x 3 days, taking ibuprofen and excedrin without relief. Endorses nausea.

## 2016-09-12 NOTE — ED Provider Notes (Signed)
[facility] Provider Note   CSN: [id number] Arrival date & time: 09/12/16  1811  By signing my name below, I, Julien Nordmann, attest that this documentation has been prepared under the direction and in the presence of Quincy Carnes, PA-C.  Electronically Signed: Julien Nordmann, ED Scribe. 09/12/16. 7:35 PM.    History   Chief Complaint Chief Complaint  Patient presents with  . Headache   The history is provided by the patient. No language interpreter was used.   HPI Comments: [name] is a 31 y.o. female who has a PMhx of migraines presents to the Emergency Department complaining of a moderate, intermittent, headache that has been occurring "for awhile". Pt expresses that her pain has been constant the past 3-4 days. She describes the pain as a "pounding" sensation. She says the pain starts at the base of her neck and radiates up into the frontal part of her head. She reports associated left sided facial pain, nausea, and photophobia. Pt has a PMhx of migraines. She reports that she has been taking Excedrin Migraine and Ibuprofen to alleviate her pain without relief. She notes that she has been having difficulty seeing a neurologist due to lack of insurance. She has a f/u appt next week with new PCP. She denies vomiting.  No fever, chills, or neck pain.    Past Medical History:  Diagnosis Date  . ADD (attention deficit disorder)   . Anxiety   . Anxiety disorder   . Back pain   . Barrett esophagus   . Basedow's disease   . Bipolar disorder ([place])   . Chronic back pain   . Fibroid   . Gastric reflux   . History of nephrolithiasis   . Interstitial cystitis   . Migraine   . Pyelonephritis     Patient Active Problem List   Diagnosis Date Noted  . Liver lesion 09/15/2015  . Dysuria 12/10/2014  . Cellulitis 10/02/2014  . Abdominal pain 06/17/2014  . Tobacco abuse 06/17/2014  . Vomiting 06/16/2014  . Back pain 05/21/2014  . Neck pain 05/21/2014  . Thoracic  back pain 05/21/2014  . ADD (attention deficit disorder) 04/01/2014  . Eye pressure 03/14/2014  . Blurred vision, left eye 03/14/2014  . UTI (urinary tract infection) 02/28/2014  . Gallstones 01/05/2014  . Cholecystitis 01/05/2014  . Biliary dyskinesia 01/01/2014  . SVD (spontaneous vaginal delivery) 08/19/2013  . Indication for care in labor or delivery 08/18/2013  . Tick bite 02/28/2012  . Abscess 02/28/2012  . Boil 10/26/2011  . Bipolar 1 disorder ([place]) 10/15/2011  . Head injury 09/28/2010  . CRUSHING INJURY OF HAND 02/10/2010  . EYE PAIN, LEFT 12/04/2009  . MOOD SWINGS 11/28/2009  . ARTHRITIS, BACK 10/02/2009  . ANXIETY STATE, UNSPECIFIED 09/23/2009  . KNEE PAIN, RIGHT 02/21/2009  . ANKLE PAIN, RIGHT 02/21/2009  . NAUSEA AND VOMITING 10/16/2008  . NEPHROLITHIASIS, HX OF 05/28/2008  . GASTROINTESTINAL XRAY, ABNORMAL 04/04/2008  . SYMPTOM, PAIN, ABDOMINAL, RIGHT UP QUADRANT 03/08/2007  . FLANK PAIN, RIGHT 03/08/2007  . BASEDOW'S DISEASE 03/06/2007  . PYELONEPHRITIS NOS 02/17/2007  . FATIGUE 02/17/2007  . OTITIS EXTERNA, ACUTE, LEFT 12/06/2006  . GERD 12/06/2006  . BACK PAIN 12/06/2006    Past Surgical History:  Procedure Laterality Date  . ADENOIDECTOMY    . CHOLECYSTECTOMY N/A 01/05/2014   Procedure: LAPAROSCOPIC CHOLECYSTECTOMY WITH INTRAOPERATIVE CHOLANGIOGRAM;  Surgeon: Edward Jolly, MD;  Location: [facility] OR[facility];  Service: General;  Laterality: N/A;  . ESOPHAGOGASTRODUODENOSCOPY (EGD) WITH PROPOFOL  N/A 06/18/2014   Procedure: ESOPHAGOGASTRODUODENOSCOPY (EGD) WITH PROPOFOL;  Surgeon: Wonda Horner, MD;  Location: [facility] ENDOSCOPY;  Service: Endoscopy;  Laterality: N/A;  . TEAR DUCT PROBING    . TONSILLECTOMY    . WISDOM TOOTH EXTRACTION      OB History    Gravida Para Term Preterm AB Living   1 1 1     1    SAB TAB Ectopic Multiple Live Births           1       Home Medications    Prior to Admission medications   Medication Sig Start Date End Date Taking?  Authorizing Provider  HYDROcodone-acetaminophen (NORCO) 10-325 MG tablet take 1 tablet by mouth twice a day if needed for pain 05/31/16  Yes Historical Provider, MD  Norgestimate-Ethinyl Estradiol Triphasic (TRI-PREVIFEM) 0.18/0.215/0.25 MG-35 MCG tablet Take 1 tablet by mouth daily.   Yes Historical Provider, MD  butalbital-acetaminophen-caffeine (FIORICET) 50-325-40 MG tablet Take 1-2 tablets by mouth every 6 (six) hours as needed for headache. 02/16/16 02/15/17  Shawn C Joy, PA-C  fluconazole (DIFLUCAN) 150 MG tablet Take one tablet in 72 hours if your symptoms persist. Patient not taking: Reported on 06/25/2016 05/11/16   Gloriann Loan, PA-C  HYDROcodone-acetaminophen (NORCO) 5-325 MG tablet Take 2 tablets by mouth every 4 (four) hours as needed. Patient not taking: Reported on 06/25/2016 06/08/16   Margarita Mail, PA-C  ibuprofen (ADVIL,MOTRIN) 800 MG tablet Take 1 tablet (800 mg total) by mouth every 8 (eight) hours as needed for mild pain or moderate pain. 06/17/16   Clayton Bibles, PA-C  meloxicam (MOBIC) 7.5 MG tablet take 1 tablet by mouth twice a day as needed for pain 05/31/16   Historical Provider, MD  methocarbamol (ROBAXIN) 500 MG tablet Take 1 tablet (500 mg total) by mouth 2 (two) times daily. Patient not taking: Reported on 06/25/2016 05/11/16   Gloriann Loan, PA-C  metoCLOPramide (REGLAN) 10 MG tablet Take 1 tablet (10 mg total) by mouth every 6 (six) hours. 05/11/16   Gloriann Loan, PA-C  metroNIDAZOLE (FLAGYL) 500 MG tablet Take 1 tablet (500 mg total) by mouth 2 (two) times daily. One po bid x 7 days Patient not taking: Reported on 06/25/2016 06/08/16   Margarita Mail, PA-C  naproxen (NAPROSYN) 500 MG tablet Take 1 tablet (500 mg total) by mouth 2 (two) times daily. Patient not taking: Reported on 06/25/2016 05/11/16   Gloriann Loan, PA-C  ondansetron (ZOFRAN ODT) 4 MG disintegrating tablet Take 1 tablet (4 mg total) by mouth every 8 (eight) hours as needed for nausea or vomiting. 02/16/16   Shawn C Joy,  PA-C  ondansetron (ZOFRAN) 4 MG tablet Take 1 tablet (4 mg total) by mouth every 8 (eight) hours as needed for nausea or vomiting. Patient not taking: Reported on 06/25/2016 06/08/16   Margarita Mail, PA-C    Family History Family History  Problem Relation Age of Onset  . Diabetes    . Hypertension    . Lung cancer    . Bipolar disorder Sister     Social History Social History  Substance Use Topics  . Smoking status: Current Every Day Smoker    Packs/day: 0.50    Years: 8.00    Types: Cigarettes  . Smokeless tobacco: Never Used  . Alcohol use No     Allergies   Patient has no known allergies.   Review of Systems Review of Systems  Eyes: Positive for photophobia.  Gastrointestinal: Positive for nausea. Negative for  vomiting.  Neurological: Positive for headaches.  All other systems reviewed and are negative.    Physical Exam Updated Vital Signs BP 132/89 (BP Location: Left Arm)   Pulse 86   Temp 99.4 F (37.4 C) (Oral)   Resp 18   Ht 5\' 6"  (1.676 m)   Wt 145 lb (65.8 kg)   LMP 08/19/2016   SpO2 99%   BMI 23.40 kg/m   Physical Exam  Constitutional: She is oriented to person, place, and time. She appears well-developed and well-nourished. No distress.  HENT:  Head: Normocephalic and atraumatic.  Right Ear: External ear normal.  Left Ear: External ear normal.  Mouth/Throat: Oropharynx is clear and moist.  Eyes: Conjunctivae and EOM are normal. Pupils are equal, round, and reactive to light.  Neck: Normal range of motion and full passive range of motion without pain. Neck supple. No neck rigidity.  No rigidity, no meningismus  Cardiovascular: Normal rate, regular rhythm and normal heart sounds.   No murmur heard. Pulmonary/Chest: Effort normal and breath sounds normal. No respiratory distress. She has no wheezes. She has no rhonchi.  Abdominal: Soft. Bowel sounds are normal. She exhibits no distension. There is no tenderness. There is no guarding.    Musculoskeletal: Normal range of motion. She exhibits no edema.  Neurological: She is alert and oriented to person, place, and time. She has normal strength. She displays no tremor. No cranial nerve deficit or sensory deficit. She displays no seizure activity.  AAOx3, answering questions and following commands appropriately; equal strength UE and LE bilaterally; CN grossly intact; moves all extremities appropriately without ataxia; no focal neuro deficits or facial asymmetry appreciated  Skin: Skin is warm and dry. No rash noted. She is not diaphoretic.  Psychiatric: She has a normal mood and affect. Her behavior is normal. Thought content normal.  Nursing note and vitals reviewed.    ED Treatments / Results  DIAGNOSTIC STUDIES: Oxygen Saturation is 99% on RA, normal by my interpretation.  COORDINATION OF CARE:  7:25 PM Discussed treatment plan with pt at bedside and pt agreed to plan.  Labs (all labs ordered are listed, but only abnormal results are displayed) Labs Reviewed - No data to display  EKG  EKG Interpretation None       Radiology No results found.  Procedures Procedures (including critical care time)  Medications Ordered in ED Medications - No data to display   Initial Impression / Assessment and Plan / ED Course  I have reviewed the triage vital signs and the nursing notes.  Pertinent labs & imaging results that were available during my care of the patient were reviewed by me and considered in my medical decision making (see chart for details).  31 year old female here with headaches. Has history of migraines, seen here multiple times in the past for same. She is afebrile and nontoxic. Neurologic exam is nonfocal. No signs or symptoms concerning for meningitis. Patient treated here with Toradol and Compazine with some improvement of her headache but not complete resolution.  Feels she is able to rest comfortably at home. VSS.  Remains neurologically intact.   Feel she is stable for discharge.  She reports she has responded well to Fioricet in the past, will prescribe a few of these to last until she sees her new PCP next week.  Discussed plan with patient, she acknowledged understanding and agreed with plan of care.  Return precautions given for new or worsening symptoms.  Final Clinical Impressions(s) / ED Diagnoses  Final diagnoses:  Acute nonintractable headache, unspecified headache type    New Prescriptions Discharge Medication List as of 09/12/2016 10:35 PM     I personally performed the services described in this documentation, which was scribed in my presence. The recorded information has been reviewed and is accurate.   Larene Pickett, PA-C 09/12/16 2311    Leo Grosser, MD 09/13/16 [phone]

## 2016-09-12 NOTE — ED Notes (Signed)
Alert, NAD, calm, interactive, resps e/u, speaking in clear complete sentences, no dyspnea noted, skin W&D, rates HA the same, "unchanged", "neck and whole head, L>R, different from usual HAs", (denies: sob, nausea, dizziness or visual changes.

## 2016-09-12 NOTE — ED Notes (Signed)
Pt given d/c instructions as per chart. Rx x 1. Verbalizes understanding. No questions. 

## 2016-09-12 NOTE — Discharge Instructions (Signed)
Take the prescribed medication as directed as needed for severe headache. Follow-up with your primary care doctor next week as scheduled. Return to the ED for new or worsening symptoms.

## 2016-09-15 ENCOUNTER — Emergency Department (HOSPITAL_BASED_OUTPATIENT_CLINIC_OR_DEPARTMENT_OTHER)
Admission: EM | Admit: 2016-09-15 | Discharge: 2016-09-15 | Disposition: A | Discharge status: Home / Self Care | Payer: Self-pay | Attending: Emergency Medicine | Admitting: Emergency Medicine

## 2016-09-15 ENCOUNTER — Encounter (HOSPITAL_BASED_OUTPATIENT_CLINIC_OR_DEPARTMENT_OTHER): Payer: Self-pay

## 2016-09-15 DIAGNOSIS — F17210 Nicotine dependence, cigarettes, uncomplicated: Secondary | ICD-10-CM | POA: Insufficient documentation

## 2016-09-15 DIAGNOSIS — Z79899 Other long term (current) drug therapy: Secondary | ICD-10-CM | POA: Insufficient documentation

## 2016-09-15 DIAGNOSIS — M542 Cervicalgia: Secondary | ICD-10-CM | POA: Insufficient documentation

## 2016-09-15 MED FILL — NORG-EE 0.18-0.215-0.25/0.0: 0.18/0.215/ | 28 days supply | Qty: 28 | Fill #6

## 2016-09-15 NOTE — ED Provider Notes (Signed)
[facility] DEPT [facility] Provider Note   CSN: [id number] Arrival date & time: 09/15/16  1858     History   Chief Complaint Chief Complaint  Patient presents with  . Neck Pain    HPI [name] is a 31 y.o. female.  Patient presents with neck pain that has been going on for years. States the pain is 6/10 at the moment. Does not radiate and denies headache, fever, rash, numbness/weakness. She was seen in ED few days ago for a headache. She was given a work note until today but she feels as though she is still not able to do her work due to the pain. She has an appointment scheduled with ortho at the beginning of next week.       Past Medical History:  Diagnosis Date  . ADD (attention deficit disorder)   . Anxiety   . Anxiety disorder   . Back pain   . Barrett esophagus   . Basedow's disease   . Bipolar disorder ([place])   . Chronic back pain   . Fibroid   . Gastric reflux   . History of nephrolithiasis   . Interstitial cystitis   . Migraine   . Pyelonephritis     Patient Active Problem List   Diagnosis Date Noted  . Liver lesion 09/15/2015  . Dysuria 12/10/2014  . Cellulitis 10/02/2014  . Abdominal pain 06/17/2014  . Tobacco abuse 06/17/2014  . Vomiting 06/16/2014  . Back pain 05/21/2014  . Neck pain 05/21/2014  . Thoracic back pain 05/21/2014  . ADD (attention deficit disorder) 04/01/2014  . Eye pressure 03/14/2014  . Blurred vision, left eye 03/14/2014  . UTI (urinary tract infection) 02/28/2014  . Gallstones 01/05/2014  . Cholecystitis 01/05/2014  . Biliary dyskinesia 01/01/2014  . SVD (spontaneous vaginal delivery) 08/19/2013  . Indication for care in labor or delivery 08/18/2013  . Tick bite 02/28/2012  . Abscess 02/28/2012  . Boil 10/26/2011  . Bipolar 1 disorder ([place]) 10/15/2011  . Head injury 09/28/2010  . CRUSHING INJURY OF HAND 02/10/2010  . EYE PAIN, LEFT 12/04/2009  . MOOD SWINGS 11/28/2009  . ARTHRITIS, BACK 10/02/2009  . ANXIETY  STATE, UNSPECIFIED 09/23/2009  . KNEE PAIN, RIGHT 02/21/2009  . ANKLE PAIN, RIGHT 02/21/2009  . NAUSEA AND VOMITING 10/16/2008  . NEPHROLITHIASIS, HX OF 05/28/2008  . GASTROINTESTINAL XRAY, ABNORMAL 04/04/2008  . SYMPTOM, PAIN, ABDOMINAL, RIGHT UP QUADRANT 03/08/2007  . FLANK PAIN, RIGHT 03/08/2007  . BASEDOW'S DISEASE 03/06/2007  . PYELONEPHRITIS NOS 02/17/2007  . FATIGUE 02/17/2007  . OTITIS EXTERNA, ACUTE, LEFT 12/06/2006  . GERD 12/06/2006  . BACK PAIN 12/06/2006    Past Surgical History:  Procedure Laterality Date  . ADENOIDECTOMY    . CHOLECYSTECTOMY N/A 01/05/2014   Procedure: LAPAROSCOPIC CHOLECYSTECTOMY WITH INTRAOPERATIVE CHOLANGIOGRAM;  Surgeon: Edward Jolly, MD;  Location: [facility];  Service: General;  Laterality: N/A;  . ESOPHAGOGASTRODUODENOSCOPY (EGD) WITH PROPOFOL N/A 06/18/2014   Procedure: ESOPHAGOGASTRODUODENOSCOPY (EGD) WITH PROPOFOL;  Surgeon: Wonda Horner, MD;  Location: [facility] ENDOSCOPY;  Service: Endoscopy;  Laterality: N/A;  . TEAR DUCT PROBING    . TONSILLECTOMY    . WISDOM TOOTH EXTRACTION      OB History    Gravida Para Term Preterm AB Living   1 1 1     1    SAB TAB Ectopic Multiple Live Births           1       Home Medications    Prior  to Admission medications   Medication Sig Start Date End Date Taking? Authorizing Provider  HYDROcodone-acetaminophen (NORCO) 10-325 MG tablet take 1 tablet by mouth twice a day if needed for pain 05/31/16   Historical Provider, MD  meloxicam (MOBIC) 7.5 MG tablet take 1 tablet by mouth twice a day as needed for pain 05/31/16   Historical Provider, MD  Norgestimate-Ethinyl Estradiol Triphasic (TRI-PREVIFEM) 0.18/0.215/0.25 MG-35 MCG tablet Take 1 tablet by mouth daily.    Historical Provider, MD    Family History Family History  Problem Relation Age of Onset  . Diabetes    . Hypertension    . Lung cancer    . Bipolar disorder Sister     Social History Social History  Substance Use Topics  . Smoking  status: Current Every Day Smoker    Packs/day: 0.50    Years: 8.00    Types: Cigarettes  . Smokeless tobacco: Never Used  . Alcohol use No     Allergies   Patient has no known allergies.   Review of Systems Review of Systems  Constitutional: Negative for chills and fever.  HENT: Negative for rhinorrhea, sneezing and sore throat.   Eyes: Negative for photophobia and visual disturbance.  Respiratory: Negative for cough, chest tightness and shortness of breath.   Cardiovascular: Negative for chest pain and palpitations.  Gastrointestinal: Negative for abdominal pain, nausea and vomiting.  Musculoskeletal: Positive for neck pain. Negative for myalgias.  Skin: Negative for rash.  Neurological: Negative for dizziness, weakness, light-headedness, numbness and headaches.  All other systems reviewed and are negative.    Physical Exam Updated Vital Signs BP (!) 133/93 (BP Location: Left Arm)   Pulse 94   Temp 97.9 F (36.6 C) (Oral)   Resp 18   Ht 5\' 5"  (1.651 m)   Wt 65.8 kg   LMP 09/13/2016   SpO2 100%   BMI 24.13 kg/m   Physical Exam  Constitutional: She is oriented to person, place, and time. She appears well-developed and well-nourished. No distress.  HENT:  Head: Normocephalic and atraumatic.  Nose: Nose normal.  Eyes: Conjunctivae and EOM are normal. Pupils are equal, round, and reactive to light. Right eye exhibits no discharge. Left eye exhibits no discharge. No scleral icterus.  Neck: Normal range of motion. Neck supple.  Cardiovascular: Normal rate, regular rhythm, normal heart sounds and intact distal pulses.  Exam reveals no gallop and no friction rub.   No murmur heard. Pulmonary/Chest: Effort normal and breath sounds normal. No respiratory distress.  Abdominal: Soft. Bowel sounds are normal. She exhibits no distension. There is no tenderness. There is no guarding.  Musculoskeletal: Normal range of motion. She exhibits no edema, tenderness or deformity.    Lymphadenopathy:    She has no cervical adenopathy.  Neurological: She is alert and oriented to person, place, and time. No sensory deficit. She exhibits normal muscle tone. Coordination normal.  Skin: Skin is warm and dry. No rash noted. She is not diaphoretic.  Psychiatric: She has a normal mood and affect.  Nursing note and vitals reviewed.    ED Treatments / Results  Labs (all labs ordered are listed, but only abnormal results are displayed) Labs Reviewed - No data to display  EKG  EKG Interpretation None       Radiology No results found.  Procedures Procedures (including critical care time)  Medications Ordered in ED Medications - No data to display   Initial Impression / Assessment and Plan / ED Course  I  have reviewed the triage vital signs and the nursing notes.  Pertinent labs & imaging results that were available during my care of the patient were reviewed by me and considered in my medical decision making (see chart for details).     Patient's history and symptoms concerning for chronic cervical muscle strain. She reports history of this pain for several years. She endorses no new symptoms or red flags including headache, fever, rash, limited ROM, stiffness. She states the pain is hindering her from being able to do her job properly. She has an appointment scheduled with ortho in 4 days. Should return to work in 3 days if possible. Return precautions given.   Final Clinical Impressions(s) / ED Diagnoses   Final diagnoses:  Neck pain    New Prescriptions Discharge Medication List as of 09/15/2016  7:56 PM       Kimmy Totten Shelly Coss, PA-C 09/16/16 0207    Julianne Rice, MD 09/16/16 2257

## 2016-09-15 NOTE — ED Triage Notes (Signed)
c/o pain to posterior neck that runs up the back of head x "years"-pt seen for same and was given RTW for today she is still having pain and unable to return to [facility] gait

## 2016-09-15 NOTE — Discharge Instructions (Signed)
Follow up with orthopedist at scheduled appointment. Return to ED for fever, worsening of symptoms, numbness/weakness or trouble walking.

## 2016-10-04 ENCOUNTER — Encounter (HOSPITAL_BASED_OUTPATIENT_CLINIC_OR_DEPARTMENT_OTHER): Payer: Self-pay | Admitting: Emergency Medicine

## 2016-10-04 ENCOUNTER — Emergency Department (HOSPITAL_BASED_OUTPATIENT_CLINIC_OR_DEPARTMENT_OTHER): Payer: Self-pay

## 2016-10-04 ENCOUNTER — Emergency Department (HOSPITAL_BASED_OUTPATIENT_CLINIC_OR_DEPARTMENT_OTHER)
Admission: EM | Admit: 2016-10-04 | Discharge: 2016-10-04 | Disposition: A | Discharge status: Home / Self Care | Payer: Self-pay | Attending: Emergency Medicine | Admitting: Emergency Medicine

## 2016-10-04 DIAGNOSIS — M542 Cervicalgia: Secondary | ICD-10-CM | POA: Insufficient documentation

## 2016-10-04 DIAGNOSIS — G8929 Other chronic pain: Secondary | ICD-10-CM | POA: Insufficient documentation

## 2016-10-04 DIAGNOSIS — M25511 Pain in right shoulder: Secondary | ICD-10-CM | POA: Insufficient documentation

## 2016-10-04 DIAGNOSIS — F17210 Nicotine dependence, cigarettes, uncomplicated: Secondary | ICD-10-CM | POA: Insufficient documentation

## 2016-10-04 MED ORDER — ACETAMINOPHEN 325 MG PO TABS
650.0000 mg | ORAL_TABLET | Freq: Once | ORAL | Status: AC
  Administered 2016-10-04: 650 mg via ORAL
  Filled 2016-10-04: qty 2

## 2016-10-04 NOTE — ED Notes (Signed)
Reports that she see's Dr. Norville Haggard for pain clinic,  receives hydrocodone but they have not worked.

## 2016-10-04 NOTE — ED Provider Notes (Signed)
[name] Provider Note   CSN: [id number] Arrival date & time: 10/04/16  4098     History   Chief Complaint Chief Complaint  Patient presents with  . Neck Pain    HPI [name] is a 31 y.o. female. complains of right-sided paracervical neck pain for the past 3 years which has gotten worse over the past 3 weeks. Pain radiates to her occiput worse with rotating her neck improved with remaining still. No other associated symptoms no focal numbness or weakness no fever. No other associated symptoms. She is treated herself with hydrocodone, without relief.  HPI  Past Medical History:  Diagnosis Date  . ADD (attention deficit disorder)   . Anxiety   . Anxiety disorder   . Back pain   . Barrett esophagus   . Basedow's disease   . Bipolar disorder ([place])   . Chronic back pain   . Fibroid   . Gastric reflux   . History of nephrolithiasis   . Interstitial cystitis   . Migraine   . Pyelonephritis     Patient Active Problem List   Diagnosis Date Noted  . Liver lesion 09/15/2015  . Dysuria 12/10/2014  . Cellulitis 10/02/2014  . Abdominal pain 06/17/2014  . Tobacco abuse 06/17/2014  . Vomiting 06/16/2014  . Back pain 05/21/2014  . Neck pain 05/21/2014  . Thoracic back pain 05/21/2014  . ADD (attention deficit disorder) 04/01/2014  . Eye pressure 03/14/2014  . Blurred vision, left eye 03/14/2014  . UTI (urinary tract infection) 02/28/2014  . Gallstones 01/05/2014  . Cholecystitis 01/05/2014  . Biliary dyskinesia 01/01/2014  . SVD (spontaneous vaginal delivery) 08/19/2013  . Indication for care in labor or delivery 08/18/2013  . Tick bite 02/28/2012  . Abscess 02/28/2012  . Boil 10/26/2011  . Bipolar 1 disorder ([place]) 10/15/2011  . Head injury 09/28/2010  . CRUSHING INJURY OF HAND 02/10/2010  . EYE PAIN, LEFT 12/04/2009  . MOOD SWINGS 11/28/2009  . ARTHRITIS, BACK 10/02/2009  . ANXIETY STATE, UNSPECIFIED 09/23/2009  . KNEE PAIN, RIGHT  02/21/2009  . ANKLE PAIN, RIGHT 02/21/2009  . NAUSEA AND VOMITING 10/16/2008  . NEPHROLITHIASIS, HX OF 05/28/2008  . GASTROINTESTINAL XRAY, ABNORMAL 04/04/2008  . SYMPTOM, PAIN, ABDOMINAL, RIGHT UP QUADRANT 03/08/2007  . FLANK PAIN, RIGHT 03/08/2007  . BASEDOW'S DISEASE 03/06/2007  . PYELONEPHRITIS NOS 02/17/2007  . FATIGUE 02/17/2007  . OTITIS EXTERNA, ACUTE, LEFT 12/06/2006  . GERD 12/06/2006  . BACK PAIN 12/06/2006    Past Surgical History:  Procedure Laterality Date  . ADENOIDECTOMY    . CHOLECYSTECTOMY N/A 01/05/2014   Procedure: LAPAROSCOPIC CHOLECYSTECTOMY WITH INTRAOPERATIVE CHOLANGIOGRAM;  Surgeon: Edward Jolly, MD;  Location: [facility];  Service: General;  Laterality: N/A;  . ESOPHAGOGASTRODUODENOSCOPY (EGD) WITH PROPOFOL N/A 06/18/2014   Procedure: ESOPHAGOGASTRODUODENOSCOPY (EGD) WITH PROPOFOL;  Surgeon: Wonda Horner, MD;  Location: [facility] ENDOSCOPY;  Service: Endoscopy;  Laterality: N/A;  . TEAR DUCT PROBING    . TONSILLECTOMY    . WISDOM TOOTH EXTRACTION      OB History    Gravida Para Term Preterm AB Living   1 1 1     1    SAB TAB Ectopic Multiple Live Births           1       Home Medications    Prior to Admission medications   Medication Sig Start Date End Date Taking? Authorizing Provider  HYDROcodone-acetaminophen (NORCO) 10-325 MG tablet take 1 tablet by mouth twice  a day if needed for pain 05/31/16   Historical Provider, MD  meloxicam (MOBIC) 7.5 MG tablet take 1 tablet by mouth twice a day as needed for pain 05/31/16   Historical Provider, MD  Norgestimate-Ethinyl Estradiol Triphasic (TRI-PREVIFEM) 0.18/0.215/0.25 MG-35 MCG tablet Take 1 tablet by mouth daily.    Historical Provider, MD    Family History Family History  Problem Relation Age of Onset  . Diabetes    . Hypertension    . Lung cancer    . Bipolar disorder Sister     Social History Social History  Substance Use Topics  . Smoking status: Current Every Day Smoker    Packs/day:  0.50    Years: 8.00    Types: Cigarettes  . Smokeless tobacco: Never Used  . Alcohol use No     Allergies   Patient has no known allergies.   Review of Systems Review of Systems  Constitutional: Negative.   HENT: Negative.   Respiratory: Negative.   Cardiovascular: Negative.   Gastrointestinal: Negative.   Musculoskeletal: Positive for back pain and neck pain.       Chronic neck and back pain will followed in pain clinic  Skin: Negative.   Neurological: Negative.   Psychiatric/Behavioral: Negative.   All other systems reviewed and are negative.    Physical Exam Updated Vital Signs BP 104/72 (BP Location: Left Arm)   Pulse 74   Temp 98.7 F (37.1 C) (Oral)   Resp 18   Ht 5\' 6"  (1.676 m)   Wt 145 lb (65.8 kg)   LMP 09/13/2016   SpO2 100%   BMI 23.40 kg/m   Physical Exam  Constitutional: She appears well-developed and well-nourished.  HENT:  Head: Normocephalic and atraumatic.  Eyes: Conjunctivae are normal. Pupils are equal, round, and reactive to light.  Neck: Neck supple. No tracheal deviation present. No thyromegaly present.  No tenderness along cervical spine. She has mild tenderness at paracervical area along right trapezius muscle  Cardiovascular: Normal rate and regular rhythm.   No murmur heard. Pulmonary/Chest: Effort normal and breath sounds normal.  Abdominal: Soft. Bowel sounds are normal. She exhibits no distension. There is no tenderness.  Musculoskeletal: Normal range of motion. She exhibits no edema or tenderness.  Neurological: She is alert. Coordination normal.  Skin: Skin is warm and dry. No rash noted.  Psychiatric: She has a normal mood and affect.  Nursing note and vitals reviewed.    ED Treatments / Results  Labs (all labs ordered are listed, but only abnormal results are displayed) Labs Reviewed - No data to display  EKG  EKG Interpretation None       Radiology No results found.  Procedures Procedures (including  critical care time)  Medications Ordered in ED Medications - No data to display  12:25 PM patient resting comfortably after treatment with Tylenol. X-rays viewed by me Results for orders placed or performed during the hospital encounter of 09/02/16  Urinalysis, Routine w reflex microscopic  Result Value Ref Range   Color, Urine AMBER (A) YELLOW   APPearance CLEAR CLEAR   Specific Gravity, Urine 1.023 1.005 - 1.030   pH 5.0 5.0 - 8.0   Glucose, UA NEGATIVE NEGATIVE mg/dL   Hgb urine dipstick NEGATIVE NEGATIVE   Bilirubin Urine NEGATIVE NEGATIVE   Ketones, ur NEGATIVE NEGATIVE mg/dL   Protein, ur NEGATIVE NEGATIVE mg/dL   Nitrite NEGATIVE NEGATIVE   Leukocytes, UA NEGATIVE NEGATIVE  Pregnancy, urine  Result Value Ref Range   Preg  Test, Ur NEGATIVE NEGATIVE   Dg Cervical Spine Complete  Result Date: 10/04/2016 CLINICAL DATA:  Right neck pain for 2 weeks. EXAM: CERVICAL SPINE - COMPLETE 4+ VIEW COMPARISON:  05/16/2014 FINDINGS: There is no evidence of cervical spine fracture or prevertebral soft tissue swelling. Alignment is normal. No other significant bone abnormalities are identified. IMPRESSION: Negative cervical spine radiographs. Electronically Signed   By: Rolm Baptise M.D.   On: 10/04/2016 11:50   Initial Impression / Assessment and Plan / ED Course  I have reviewed the triage vital signs and the nursing notes.  Pertinent labs & imaging results that were available during my care of the patient were reviewed by me and considered in my medical decision making (see chart for details).    Plan follow-up with Dr. Etter Sjogren or pain management physician   Final Clinical Impressions(s) / ED Diagnoses  Diagnosis chronic neck pain Final diagnoses:  None    New Prescriptions New Prescriptions   No medications on file     Orlie Dakin, MD 10/04/16 1229

## 2016-10-04 NOTE — Discharge Instructions (Signed)
Follow-up with Dr.Lowne for your pain management physician if pain is not well controlled with the medication that you are prescribed

## 2016-10-04 NOTE — ED Notes (Signed)
ED Provider at bedside. 

## 2016-10-04 NOTE — ED Triage Notes (Addendum)
Patient states that she was here on the 4th of April for right sided neck pain with radiation up into the back of her head. The patient reports that it continues and reports that it is worse now than before. Denies any injury at the time of first visit. The patient reports that she has a 31 year old and she is lifting them frequently. Denies any numbness or tingling. Reports that she is nauseated

## 2016-10-05 ENCOUNTER — Ambulatory Visit (INDEPENDENT_AMBULATORY_CARE_PROVIDER_SITE_OTHER): Payer: Self-pay | Admitting: Medical

## 2016-10-05 ENCOUNTER — Encounter: Payer: Self-pay | Admitting: Medical

## 2016-10-05 VITALS — BP 97/69 | HR 78 | Temp 98.2°F | Resp 16 | Ht 66.0 in | Wt 147.0 lb

## 2016-10-05 DIAGNOSIS — R519 Headache, unspecified: Secondary | ICD-10-CM

## 2016-10-05 DIAGNOSIS — M542 Cervicalgia: Secondary | ICD-10-CM

## 2016-10-05 DIAGNOSIS — R51 Headache: Secondary | ICD-10-CM

## 2016-10-05 DIAGNOSIS — S46811A Strain of other muscles, fascia and tendons at shoulder and upper arm level, right arm, initial encounter: Secondary | ICD-10-CM

## 2016-10-05 MED ORDER — CYCLOBENZAPRINE HCL 5 MG PO TABS: 5.0000 mg | ORAL_TABLET | Freq: Three times a day (TID) | ORAL | 0 refills | Status: DC | PRN

## 2016-10-05 MED FILL — CYCLOBENZAPRINE 5 MG TABLET: 5 | 10 days supply | Qty: 30 | Fill #0

## 2016-10-05 NOTE — Progress Notes (Signed)
Pre visit review using our clinic review tool, if applicable. No additional management support is needed unless otherwise documented below in the visit note. 

## 2016-10-05 NOTE — Progress Notes (Signed)
Subjective:    Patient ID: [name], female    DOB: [birth year], 31 y.o.   MRN: [id number]  HPI  Pt in with some pain in her rt side of neck. Pain in past in this area her son was born 3 years ago. Pain was faint mild on and off for past 3 years. But recently she has rt side neck pain on and off moderate to severe for 1 month. Pt states pain in neck willl often lead to head aches daily on rt side of head. Pt went to ED yesterday and no medication given. They diagnosed with chronic neck pain. Pt has some low back pain arthritis. Occasional she will hydrocodone in attempt to treat neck pain and ha but after she uses hydrocodone head ache will be worse.  Pt denies hx of migraines. Sometimes she will get faint light sensitive with ha after neck pain.  Pt had a negative cervical spine xray yesterday.  LMP- 2 weeks ago.  Pt is on meloxicam. Last time she took was yesterday.   Review of Systems  Constitutional: Negative for chills, fatigue and fever.  HENT: Negative for sinus pain and sinus pressure.   Respiratory: Negative for cough, chest tightness and wheezing.   Cardiovascular: Negative for chest pain and palpitations.  Gastrointestinal: Positive for nausea. Negative for abdominal pain, anal bleeding and vomiting.       Mild when get ha or neck pain.  Genitourinary: Negative for dyspareunia, flank pain, frequency and urgency.  Musculoskeletal: Positive for neck pain. Negative for arthralgias, back pain, joint swelling, myalgias and neck stiffness.  Skin: Negative for rash.  Neurological: Positive for headaches. Negative for dizziness, seizures, speech difficulty, weakness, light-headedness and numbness.       Moderate  Hematological: Negative for adenopathy. Does not bruise/bleed easily.    Past Medical History:  Diagnosis Date  . ADD (attention deficit disorder)   . Anxiety   . Anxiety disorder   . Back pain   . Barrett esophagus   . Basedow's disease   . Bipolar  disorder ([place])   . Chronic back pain   . Fibroid   . Gastric reflux   . History of nephrolithiasis   . Interstitial cystitis   . Migraine   . Pyelonephritis      Social History   Social History  . Marital status: Single    Spouse name: N/A  . Number of children: 1  . Years of education: 82   Occupational History  . driver     [employer]   Social History Main Topics  . Smoking status: Current Every Day Smoker    Packs/day: 0.50    Years: 8.00    Types: Cigarettes  . Smokeless tobacco: Never Used  . Alcohol use No  . Drug use: No  . Sexual activity: Not on file   Other Topics Concern  . Not on file   Social History Narrative   Lives at home with mother, sister, and son (age 9)   Caffeine use: 2 soda's per day       Past Surgical History:  Procedure Laterality Date  . ADENOIDECTOMY    . CHOLECYSTECTOMY N/A 01/05/2014   Procedure: LAPAROSCOPIC CHOLECYSTECTOMY WITH INTRAOPERATIVE CHOLANGIOGRAM;  Surgeon:  Jolly, MD;  Location: [facility];  Service: General;  Laterality: N/A;  . ESOPHAGOGASTRODUODENOSCOPY (EGD) WITH PROPOFOL N/A 06/18/2014   Procedure: ESOPHAGOGASTRODUODENOSCOPY (EGD) WITH PROPOFOL;  Surgeon: Wonda Horner, MD;  Location: [facility] ENDOSCOPY;  Service: Endoscopy;  Laterality: N/A;  . TEAR DUCT PROBING    . TONSILLECTOMY    . WISDOM TOOTH EXTRACTION      Family History  Problem Relation Age of Onset  . Diabetes    . Hypertension    . Lung cancer    . Bipolar disorder Sister     No Known Allergies  Current Outpatient Prescriptions on File Prior to Visit  Medication Sig Dispense Refill  . HYDROcodone-acetaminophen (NORCO) 10-325 MG tablet take 1 tablet by mouth twice a day if needed for pain  0  . meloxicam (MOBIC) 7.5 MG tablet take 1 tablet by mouth twice a day as needed for pain  0  . Norgestimate-Ethinyl Estradiol Triphasic (TRI-PREVIFEM) 0.18/0.215/0.25 MG-35 MCG tablet Take 1 tablet by mouth daily.    . [DISCONTINUED] dicyclomine  (BENTYL) 20 MG tablet Take 1 tablet (20 mg total) by mouth 2 (two) times daily. 20 tablet 0   Current Facility-Administered Medications on File Prior to Visit  Medication Dose Route Frequency Provider Last Rate Last Dose  . gadopentetate dimeglumine (MAGNEVIST) injection 14 mL  14 mL Intravenous Once PRN Melvenia Beam, MD        BP 97/69 (BP Location: Right Arm, Patient Position: Sitting, Cuff Size: Normal)   Pulse 78   Temp 98.2 F (36.8 C) (Oral)   Resp 16   Ht 5\' 6"  (1.676 m)   Wt 147 lb (66.7 kg)   LMP 09/13/2016   SpO2 99%   BMI 23.73 kg/m       Objective:   Physical Exam  General Mental Status- Alert. General Appearance- Not in acute distress.   Skin General: Color- Normal Color. Moisture- Normal Moisture.  Neck Carotid Arteries- Normal color. Moisture- Normal Moisture. No carotid bruits. No JVD. Rt side neck/ paracervical muscle tender to palpation. Trapezius tenderness. No nuccal rigidity.  Chest and Lung Exam Auscultation: Breath Sounds:-Normal.  Cardiovascular Auscultation:Rythm- Regular. Murmurs & Other Heart Sounds:Auscultation of the heart reveals- No Murmurs.   Neurologic Cranial Nerve exam:- CN III-XII intact(No nystagmus), symmetric smile. Drift Test:- No drift. Romberg Exam:- Negative.  Heal to Toe Gait exam:-Normal. Finger to Nose:- Normal/Intact Strength:- 5/5 equal and symmetric strength both upper and lower extremities.      Assessment & Plan:  Your neck pain appears to be from trapezius muscle strain. This may be contributing to tension type/like ha.  Will advise meloxicam increase to 15 mg a day. Prescribe flexeril 5 mg tabs to use tid if not working and no driving. But if working or driving then just use at night.  Advise thermacare heat patch or can use salon pas patch.  Attend Dr. Nelva Bush ortho appointment. If that falls through let me know. I could refer to sports med.  If you get severe ha with stiff neck then advise  re-evaluation at ED.  Follow up in 10 days or as needed  Pt declined toradol injection today.  Nobuko Gsell, Percell Miller, PA-C

## 2016-10-05 NOTE — Patient Instructions (Addendum)
Your neck pain appears to be from trapezius muscle strain. This may be contributing to tension type/like ha.  Will advise meloxicam increase to 15 mg a day. Prescribe flexeril 5 mg tabs to use tid if not working and no driving. But if working or driving then just use at night.  Advise thermacare heat patch or can use salon pas patch.  Attend Dr. Nelva Bush ortho appointment. If that falls through let me know. I could refer to sports med.  If you get severe ha with stiff neck then advise re-evaluation at ED.  Follow up in 10 days or as needed

## 2016-10-11 MED FILL — NORG-EE 0.18-0.215-0.25/0.0: 0.18/0.215/ | 28 days supply | Qty: 28 | Fill #7

## 2016-11-01 ENCOUNTER — Emergency Department (HOSPITAL_BASED_OUTPATIENT_CLINIC_OR_DEPARTMENT_OTHER): Payer: Self-pay

## 2016-11-01 ENCOUNTER — Emergency Department (HOSPITAL_BASED_OUTPATIENT_CLINIC_OR_DEPARTMENT_OTHER)
Admission: EM | Admit: 2016-11-01 | Discharge: 2016-11-01 | Disposition: A | Discharge status: Home / Self Care | Payer: Self-pay | Attending: Emergency Medicine | Admitting: Emergency Medicine

## 2016-11-01 ENCOUNTER — Encounter (HOSPITAL_BASED_OUTPATIENT_CLINIC_OR_DEPARTMENT_OTHER): Payer: Self-pay | Admitting: Emergency Medicine

## 2016-11-01 DIAGNOSIS — F17210 Nicotine dependence, cigarettes, uncomplicated: Secondary | ICD-10-CM | POA: Insufficient documentation

## 2016-11-01 DIAGNOSIS — N12 Tubulo-interstitial nephritis, not specified as acute or chronic: Secondary | ICD-10-CM | POA: Insufficient documentation

## 2016-11-01 LAB — COMPREHENSIVE METABOLIC PANEL
ALBUMIN: 3.2 g/dL — AB (ref 3.5–5.0)
ALT: 9 U/L — AB (ref 14–54)
AST: 20 U/L (ref 15–41)
Alkaline Phosphatase: 28 U/L — ABNORMAL LOW (ref 38–126)
Anion gap: 6 (ref 5–15)
BUN: 9 mg/dL (ref 6–20)
CHLORIDE: 107 mmol/L (ref 101–111)
CO2: 25 mmol/L (ref 22–32)
CREATININE: 0.69 mg/dL (ref 0.44–1.00)
Calcium: 8.1 mg/dL — ABNORMAL LOW (ref 8.9–10.3)
GFR calc Af Amer: >60 mL/min (ref >60)
GLUCOSE: 93 mg/dL (ref 65–99)
POTASSIUM: 3.9 mmol/L (ref 3.5–5.1)
SODIUM: 138 mmol/L (ref 135–145)
Total Bilirubin: 0.1 mg/dL — ABNORMAL LOW (ref 0.3–1.2)
Total Protein: 5.3 g/dL — ABNORMAL LOW (ref 6.5–8.1)

## 2016-11-01 LAB — LIPASE, BLOOD: Lipase: 27 U/L (ref 11–51)

## 2016-11-01 LAB — URINALYSIS, MICROSCOPIC (REFLEX)

## 2016-11-01 LAB — URINALYSIS, ROUTINE W REFLEX MICROSCOPIC
Bilirubin Urine: NEGATIVE
Glucose, UA: NEGATIVE mg/dL
HGB URINE DIPSTICK: NEGATIVE
Ketones, ur: NEGATIVE mg/dL
Nitrite: POSITIVE — AB
PROTEIN: NEGATIVE mg/dL
SPECIFIC GRAVITY, URINE: 1.024 (ref 1.005–1.030)
pH: 7 (ref 5.0–8.0)

## 2016-11-01 LAB — CBC
HEMATOCRIT: 38.2 % (ref 36.0–46.0)
Hemoglobin: 12.6 g/dL (ref 12.0–15.0)
MCH: 28.4 pg (ref 26.0–34.0)
MCHC: 33 g/dL (ref 30.0–36.0)
MCV: 86 fL (ref 78.0–100.0)
PLATELETS: 251 10*3/uL (ref 150–400)
RBC: 4.44 MIL/uL (ref 3.87–5.11)
RDW: 13.6 % (ref 11.5–15.5)
WBC: 14.6 10*3/uL — AB (ref 4.0–10.5)

## 2016-11-01 LAB — PREGNANCY, URINE: PREG TEST UR: NEGATIVE

## 2016-11-01 MED ORDER — HYDROCODONE-ACETAMINOPHEN 5-325 MG PO TABS
1.0000 | ORAL_TABLET | Freq: Once | ORAL | Status: AC
  Administered 2016-11-01: 1 via ORAL
  Filled 2016-11-01: qty 1

## 2016-11-01 MED ORDER — ONDANSETRON HCL 4 MG/2ML IJ SOLN
4.0000 mg | Freq: Once | INTRAMUSCULAR | Status: AC
  Administered 2016-11-01: 4 mg via INTRAVENOUS
  Filled 2016-11-01: qty 2

## 2016-11-01 MED ORDER — LEVOFLOXACIN 250 MG PO TABS: 750.0000 mg | ORAL_TABLET | Freq: Every day | ORAL | 0 refills | Status: AC

## 2016-11-01 MED ORDER — DEXTROSE 5 % IV SOLN
1.0000 g | Freq: Once | INTRAVENOUS | Status: AC
  Administered 2016-11-01: 1 g via INTRAVENOUS
  Filled 2016-11-01: qty 10

## 2016-11-01 MED ORDER — LEVOFLOXACIN 250 MG PO TABS: 750.0000 mg | ORAL_TABLET | Freq: Every day | ORAL | 0 refills | Status: DC

## 2016-11-01 MED ORDER — MORPHINE SULFATE (PF) 4 MG/ML IV SOLN
4.0000 mg | Freq: Once | INTRAVENOUS | Status: AC
  Administered 2016-11-01: 4 mg via INTRAVENOUS
  Filled 2016-11-01: qty 1

## 2016-11-01 NOTE — ED Notes (Signed)
Pilar Plate, PA-C in room with pt now.

## 2016-11-01 NOTE — ED Triage Notes (Signed)
Pt reports right flank/ower back pain for past week, much worse since yesterday.  Describes pain as sharp and "comes in waves"  Pt also reports urinary frequency since yesterday and some nausea.

## 2016-11-01 NOTE — Discharge Instructions (Signed)
Please take levofloxacin daily for 5 days. Please follow-up with your primary care provider regarding today's visit. Please drink plenty of fluids throughout the day.  Get help right away if: You are unable to take your antibiotics or fluids. You have shaking chills. You vomit. You have severe flank or back pain. You have extreme weakness or fainting.

## 2016-11-01 NOTE — ED Provider Notes (Signed)
[facility]PT [facility] Provider Note   CSN: [id number] Arrival date & time: 11/01/16  0109     History   Chief Complaint No chief complaint on file.   HPI [name] is a 31 y.o. female with PMHx of interstitial cysytitis, anxiety presents today with complaints of right flank pain pain for 1week. She reports her symptoms have been worsening, intermittent, sharp, 10/10. She states it is hard to get into any position. She also reports "feeling of a UTI" that she reports gets often started yesterday. She reports associated nausea. She reports trying AZO with no relief, which usually helps her UTI symptoms. She reports difficulty urinating, dysuria, frequency, urgency. She denies any association with activity or food with her symptoms. She denies fevers, chills, vomiting, diarrhea, abdominal pain. She denies blood in stools. She denies hematuria. She states she has a hx of IC which often mimics a UTI but today is different and usually doesn't cause her back pain. LMP 10/18/16. She denies hx of kidney stone. She denies vaginal pain, vaginal discharge, vaginal bleeding.   The history is provided by the patient. No language interpreter was used.    Past Medical History:  Diagnosis Date  . ADD (attention deficit disorder)   . Anxiety   . Anxiety disorder   . Back pain   . Barrett esophagus   . Basedow's disease   . Bipolar disorder ([place])   . Chronic back pain   . Fibroid   . Gastric reflux   . History of nephrolithiasis   . Interstitial cystitis   . Migraine   . Pyelonephritis     Patient Active Problem List   Diagnosis Date Noted  . Liver lesion 09/15/2015  . Dysuria 12/10/2014  . Cellulitis 10/02/2014  . Abdominal pain 06/17/2014  . Tobacco abuse 06/17/2014  . Vomiting 06/16/2014  . Back pain 05/21/2014  . Neck pain 05/21/2014  . Thoracic back pain 05/21/2014  . ADD (attention deficit disorder) 04/01/2014  . Eye pressure 03/14/2014  . Blurred vision, left eye  03/14/2014  . UTI (urinary tract infection) 02/28/2014  . Gallstones 01/05/2014  . Cholecystitis 01/05/2014  . Biliary dyskinesia 01/01/2014  . SVD (spontaneous vaginal delivery) 08/19/2013  . Indication for care in labor or delivery 08/18/2013  . Tick bite 02/28/2012  . Abscess 02/28/2012  . Boil 10/26/2011  . Bipolar 1 disorder ([place]) 10/15/2011  . Head injury 09/28/2010  . CRUSHING INJURY OF HAND 02/10/2010  . EYE PAIN, LEFT 12/04/2009  . MOOD SWINGS 11/28/2009  . ARTHRITIS, BACK 10/02/2009  . ANXIETY STATE, UNSPECIFIED 09/23/2009  . KNEE PAIN, RIGHT 02/21/2009  . ANKLE PAIN, RIGHT 02/21/2009  . NAUSEA AND VOMITING 10/16/2008  . NEPHROLITHIASIS, HX OF 05/28/2008  . GASTROINTESTINAL XRAY, ABNORMAL 04/04/2008  . SYMPTOM, PAIN, ABDOMINAL, RIGHT UP QUADRANT 03/08/2007  . FLANK PAIN, RIGHT 03/08/2007  . BASEDOW'S DISEASE 03/06/2007  . PYELONEPHRITIS NOS 02/17/2007  . FATIGUE 02/17/2007  . OTITIS EXTERNA, ACUTE, LEFT 12/06/2006  . GERD 12/06/2006  . BACK PAIN 12/06/2006    Past Surgical History:  Procedure Laterality Date  . ADENOIDECTOMY    . CHOLECYSTECTOMY N/A 01/05/2014   Procedure: LAPAROSCOPIC CHOLECYSTECTOMY WITH INTRAOPERATIVE CHOLANGIOGRAM;  Surgeon: Edward Jolly, MD;  Location: [facility];  Service: General;  Laterality: N/A;  . ESOPHAGOGASTRODUODENOSCOPY (EGD) WITH PROPOFOL N/A 06/18/2014   Procedure: ESOPHAGOGASTRODUODENOSCOPY (EGD) WITH PROPOFOL;  Surgeon: Wonda Horner, MD;  Location: [facility] ENDOSCOPY;  Service: Endoscopy;  Laterality: N/A;  . TEAR DUCT PROBING    .  TONSILLECTOMY    . WISDOM TOOTH EXTRACTION      OB History    Gravida Para Term Preterm AB Living   1 1 1     1    SAB TAB Ectopic Multiple Live Births           1       Home Medications    Prior to Admission medications   Medication Sig Start Date End Date Taking? Authorizing Provider  Norgestimate-Ethinyl Estradiol Triphasic (TRI-PREVIFEM) 0.18/0.215/0.25 MG-35 MCG tablet Take 1 tablet by  mouth daily.   Yes [provider]  cyclobenzaprine (FLEXERIL) 5 MG tablet Take 1 tablet (5 mg total) by mouth 3 (three) times daily as needed for muscle spasms. 10/05/16   Saguier, Percell Miller, PA-C  HYDROcodone-acetaminophen [facility]) 10-325 MG tablet take 1 tablet by mouth twice a day if needed for pain 05/31/16   [provider]  levofloxacin (LEVAQUIN) 250 MG tablet Take 3 tablets (750 mg total) by mouth daily. 11/01/16 11/06/16  Bettey Costa, PA  meloxicam (MOBIC) 7.5 MG tablet take 1 tablet by mouth twice a day as needed for pain 05/31/16   [provider]    Family History Family History  Problem Relation Age of Onset  . Diabetes Unknown   . Hypertension Unknown   . Lung cancer Unknown   . Bipolar disorder Sister     Social History Social History  Substance Use Topics  . Smoking status: Current Every Day Smoker    Packs/day: 0.50    Years: 8.00    Types: Cigarettes  . Smokeless tobacco: Never Used  . Alcohol use No     Allergies   Patient has no known allergies.   Review of Systems Review of Systems  Constitutional: Negative for chills and fever.  Respiratory: Negative for shortness of breath.   Cardiovascular: Negative for chest pain.  Gastrointestinal: Positive for nausea. Negative for abdominal pain, diarrhea and vomiting.  Genitourinary: Positive for flank pain. Negative for vaginal bleeding, vaginal discharge and vaginal pain.  Musculoskeletal: Positive for back pain.  Skin: Negative for wound.  All other systems reviewed and are negative.    Physical Exam Updated Vital Signs BP (!) 96/58   Pulse 60   Temp 98.4 F (36.9 C) (Oral)   Resp 18   Ht 5\' 6"  (1.676 m)   Wt 65.8 kg (145 lb)   LMP 10/18/2016 (Approximate)   SpO2 99%   BMI 23.40 kg/m   Physical Exam  Constitutional: She appears well-developed and well-nourished.  Well appearing  HENT:  Head: Normocephalic and atraumatic.  Nose: Nose normal.  Mouth/Throat:  Oropharynx is clear and moist.  Eyes: Conjunctivae and EOM are normal.  Neck: Normal range of motion.  Cardiovascular: Normal rate, normal heart sounds and intact distal pulses.   No murmur heard. Pulmonary/Chest: Effort normal. No respiratory distress. She has no wheezes. She has no rales.  Normal work of breathing. No respiratory distress noted.   Abdominal: Soft. Bowel sounds are normal. There is no tenderness. There is no rebound and no guarding.  Soft and nontender. No focal tenderness at McBurney point. Negative Murphy sign. Positive CVA tenderness to right side. Negative CVA tenderness to the left.  Musculoskeletal: Normal range of motion.  Neurological: She is alert.  Skin: Skin is warm.  Psychiatric: She has a normal mood and affect. Her behavior is normal.  Nursing note and vitals reviewed.    ED Treatments / Results  Labs (all labs ordered are listed,  but only abnormal results are displayed) Labs Reviewed  URINALYSIS, ROUTINE W REFLEX MICROSCOPIC - Abnormal; Notable for the following:       Result Value   Color, Urine ORANGE (*)    APPearance CLOUDY (*)    Nitrite POSITIVE (*)    Leukocytes, UA LARGE (*)    All other components within normal limits  COMPREHENSIVE METABOLIC PANEL - Abnormal; Notable for the following:    Calcium 8.1 (*)    Total Protein 5.3 (*)    Albumin 3.2 (*)    ALT 9 (*)    Alkaline Phosphatase 28 (*)    Total Bilirubin 0.1 (*)    All other components within normal limits  CBC - Abnormal; Notable for the following:    WBC 14.6 (*)    All other components within normal limits  URINALYSIS, MICROSCOPIC (REFLEX) - Abnormal; Notable for the following:    Bacteria, UA FEW (*)    Squamous Epithelial / LPF 6-30 (*)    All other components within normal limits  URINE CULTURE  PREGNANCY, URINE  LIPASE, BLOOD    EKG  EKG Interpretation None       Radiology US Abdomen Complete  Result Date: 11/01/2016 CLINICAL DATA:  32 year old female  with right flank pain with painful urination for 2-3 days. Prior cholecystectomy. Chronic back pain. Interstitial cystitis. Pyelonephritis. Initial encounter. EXAM: ABDOMEN ULTRASOUND COMPLETE COMPARISON:  06/08/2016 CT. FINDINGS: Gallbladder: Postcholecystectomy. Common bile duct: Diameter: 8.8 mm previously measuring 8.5 mm and size may be related to post cholecystectomy state. No common bile duct stone identified. Liver: On prior CT, 1.6 cm enhancing lesion within right lobe liver was noted. This is not appreciated on present examination which may be explained by patient's habitus and bowel gas rather than lack of lesion. IVC: Suboptimally evaluated secondary to habitus and bowel gas. Portions visualized unremarkable. Pancreas: Suboptimally evaluated secondary to habitus and bowel gas. Portions visualized unremarkable. Spleen: Size and appearance within normal limits. Right Kidney: Length: 9.9 cm. Echogenicity within normal limits. No mass or hydronephrosis visualized. Left Kidney: Length: 10.5 cm. Echogenicity within normal limits. No mass or hydronephrosis visualized. Abdominal aorta: Suboptimally evaluated secondary to habitus and bowel gas. Portions visualized unremarkable. Other findings: None. IMPRESSION: Post cholecystectomy. Prominence of the common bile duct without significant change from prior CT and may be related to post cholecystectomy state. No common bile duct stone noted. 06/08/2016 CT detected 1.6 cm enhancing lesion within the right lobe liver is not delineated on present examination possibly secondary to bowel gas/ habitus rather than lack of lesion. This lesion would be best assessed by dedicated pre and postcontrast liver MR or CT. Slightly suboptimal evaluation of portions of the inferior vena cava, pancreas and aorta secondary to bowel gas. Portions visualized appear unremarkable. Electronically Signed   By: Genia Del M.D.   On: 11/01/2016 11:44    Procedures Procedures (including  critical care time)  Medications Ordered in ED Medications  ondansetron (ZOFRAN) injection 4 mg (4 mg Intravenous Given 11/01/16 0943)  morphine 4 MG/ML injection 4 mg (4 mg Intravenous Given 11/01/16 0943)  cefTRIAXone (ROCEPHIN) 1 g in dextrose 5 % 50 mL IVPB (0 g Intravenous Stopped 11/01/16 1429)  HYDROcodone-acetaminophen (NORCO/VICODIN) 5-325 MG per tablet 1 tablet (1 tablet Oral Given 11/01/16 1340)     Initial Impression / Assessment and Plan / ED Course  I have reviewed the triage vital signs and the nursing notes.  Pertinent labs & imaging results that were available during my  care of the patient were reviewed by me and considered in my medical decision making (see chart for details).     31 yo F with PMHx IC and pyelonephritis with right flank pain, dysuria, frequency, urgency. On arrival, hemodynamically stable and WNL. Pt non-toxic on exam. Exam c/w right CVAT,  No rebound tenderness or guarding. Labs, imaging is as above. UA with +bacteria, +LE c/w UTI, suspicion of early pyelo versus IC. No evidence of stone on Ultrasound study. Pt denies any vaginal bleeding, discharge, adnexal pain, or evidence of TOA, PID, or torsion. Will treat with Levaquin given h/o good response (risks discussed, including tendon rupture), and d/c with outpt f/u. Return precautions given.   Final Clinical Impressions(s) / ED Diagnoses   Final diagnoses:  Pyelonephritis    New Prescriptions Current Discharge Medication List    START taking these medications   Details  levofloxacin (LEVAQUIN) 250 MG tablet Take 3 tablets (750 mg total) by mouth daily. Qty: 15 tablet, Refills: 0         Bettey Costa, [place] 11/01/16 1442    Dorie Rank, MD 11/02/16 [phone]

## 2016-11-02 LAB — URINE CULTURE: Culture: NO GROWTH

## 2016-11-09 ENCOUNTER — Encounter: Payer: Self-pay | Admitting: Family Medicine

## 2016-11-09 ENCOUNTER — Ambulatory Visit (INDEPENDENT_AMBULATORY_CARE_PROVIDER_SITE_OTHER): Payer: Self-pay | Admitting: Family Medicine

## 2016-11-09 VITALS — BP 100/76 | HR 82 | Temp 99.4°F | Resp 16 | Ht 66.0 in | Wt 150.2 lb

## 2016-11-09 DIAGNOSIS — R300 Dysuria: Secondary | ICD-10-CM

## 2016-11-09 DIAGNOSIS — Z87898 Personal history of other specified conditions: Secondary | ICD-10-CM

## 2016-11-09 DIAGNOSIS — R319 Hematuria, unspecified: Secondary | ICD-10-CM

## 2016-11-09 DIAGNOSIS — B354 Tinea corporis: Secondary | ICD-10-CM

## 2016-11-09 LAB — POC URINALSYSI DIPSTICK (AUTOMATED)
Bilirubin, UA: NEGATIVE
Glucose, UA: NEGATIVE
Ketones, UA: NEGATIVE
LEUKOCYTES UA: NEGATIVE
NITRITE UA: NEGATIVE
PH UA: 7.5 (ref 5.0–8.0)
PROTEIN UA: NEGATIVE
Spec Grav, UA: 1.015 (ref 1.010–1.025)
Urobilinogen, UA: 0.2 E.U./dL

## 2016-11-09 MED ORDER — NAFTIFINE HCL 1 % EX CREA: TOPICAL_CREAM | Freq: Every day | CUTANEOUS | 0 refills | Status: AC

## 2016-11-09 MED ORDER — TRAMADOL HCL 50 MG PO TABS: 50.0000 mg | ORAL_TABLET | Freq: Four times a day (QID) | ORAL | 0 refills | Status: AC | PRN

## 2016-11-09 MED FILL — levoFLOXacin 750 MG TABS: 750 | 5 days supply | Qty: 5 | Fill #0

## 2016-11-09 MED FILL — traMADol HCL 50 MG TABS: 50 | 5 days supply | Qty: 20 | Fill #0

## 2016-11-09 NOTE — Patient Instructions (Signed)
Body Ringworm Body ringworm is an infection of the skin that often causes a ring-shaped rash. Body ringworm can affect any part of your skin. It can spread easily to others. Body ringworm is also called tinea corporis. What are the causes? This condition is caused by funguses called dermatophytes. The condition develops when these funguses grow out of control on the skin. You can get this condition if you touch a person or animal that has it. You can also get it if you share clothing, bedding, towels, or any other object with an infected person or pet. What increases the risk? This condition is more likely to develop in:  Athletes who often make skin-to-skin contact with other athletes, such as wrestlers.  People who share equipment and mats.  People with a weakened immune system. What are the signs or symptoms? Symptoms of this condition include:  Itchy, raised red spots and bumps.  Red scaly patches.  A ring-shaped rash. The rash may have:  A clear center.  Scales or red bumps at its center.  Redness near its borders.  Dry and scaly skin on or around it. How is this diagnosed? This condition can usually be diagnosed with a skin exam. A skin scraping may be taken from the affected area and examined under a microscope to see if the fungus is present. How is this treated? This condition may be treated with:  An antifungal cream or ointment.  An antifungal shampoo.  Antifungal medicines. These may be prescribed if your ringworm is severe, keeps coming back, or lasts a long time. Follow these instructions at home:  Take over-the-counter and prescription medicines only as told by your health care provider.  If you were given an antifungal cream or ointment:  Use it as told by your health care provider.  Wash the infected area and dry it completely before applying the cream or ointment.  If you were given an antifungal shampoo:  Use it as told by your health care  provider.  Leave the shampoo on your body for 3-5 minutes before rinsing.  While you have a rash:  Wear loose clothing to stop clothes from rubbing and irritating it.  Wash or change your bed sheets every night.  If your pet has the same infection, take your pet to see a Animal nutritionist. How is this prevented?  Practice good hygiene.  Wear sandals or shoes in public places and showers.  Do not share personal items with others.  Avoid touching red patches of skin on other people.  Avoid touching pets that have bald spots.  If you touch an animal that has a bald spot, wash your hands. Contact a health care provider if:  Your rash continues to spread after 7 days of treatment.  Your rash is not gone in 4 weeks.  The area around your rash gets red, warm, tender, and swollen. This information is not intended to replace advice given to you by your health care provider. Make sure you discuss any questions you have with your health care provider. Document Released: 05/28/2000 Document Revised: 11/06/2015 Document Reviewed: 03/27/2015 Elsevier Interactive Patient Education  2017 [employer].

## 2016-11-09 NOTE — Progress Notes (Signed)
Patient ID: [name], female   DOB: Oct 11, 1985, 31 y.o.   MRN: [id number]     Subjective:  I acted as a Education administrator for Dr. Carollee Herter.  Guerry Bruin, [place]   Patient ID: [name], female    DOB: 1985-06-17, 31 y.o.   MRN: [id number]  Chief Complaint  Patient presents with  . Tinea    right upper leg  . Dysuria    flank pain    HPI  Patient is in today for ring worm.  Patient picked up a stray cat out of the parking lot and took it home and then to the vet and turned out to have ring worm.  She also has dysuria.  This is a recurrent thing.  She went to ED on 11/01/16 for urinary symptoms and was treated with Levaquin and Rocephin.  She has not gotten better. Urine culture showed no growth. Pt also c/o c/o back pain and is asking for pain med like tramadol to help.  Pt has no insurance and has been to ER several times for same/similar complaints.    Patient Care Team: Carollee Herter, Alferd Apa, DO as PCP - Wanda Plump, MD as Consulting Physician (General Surgery)   Past Medical History:  Diagnosis Date  . ADD (attention deficit disorder)   . Anxiety   . Anxiety disorder   . Back pain   . Barrett esophagus   . Basedow's disease   . Bipolar disorder ([place])   . Chronic back pain   . Fibroid   . Gastric reflux   . History of nephrolithiasis   . Interstitial cystitis   . Migraine   . Pyelonephritis     Past Surgical History:  Procedure Laterality Date  . ADENOIDECTOMY    . CHOLECYSTECTOMY N/A 01/05/2014   Procedure: LAPAROSCOPIC CHOLECYSTECTOMY WITH INTRAOPERATIVE CHOLANGIOGRAM;  Surgeon: Edward Jolly, MD;  Location: [facility] ORS;  Service: General;  Laterality: N/A;  . ESOPHAGOGASTRODUODENOSCOPY (EGD) WITH PROPOFOL N/A 06/18/2014   Procedure: ESOPHAGOGASTRODUODENOSCOPY (EGD) WITH PROPOFOL;  Surgeon: Wonda Horner, MD;  Location: [facility] ENDOSCOPY;  Service: Endoscopy;  Laterality: N/A;  . TEAR DUCT PROBING    . TONSILLECTOMY    . WISDOM TOOTH EXTRACTION      Family  History  Problem Relation Age of Onset  . Diabetes Unknown   . Hypertension Unknown   . Lung cancer Unknown   . Bipolar disorder Sister     Social History   Social History  . Marital status: Single    Spouse name: N/A  . Number of children: 1  . Years of education: 34   Occupational History  . driver     [employer]   Social History Main Topics  . Smoking status: Current Every Day Smoker    Packs/day: 0.50    Years: 8.00    Types: Cigarettes  . Smokeless tobacco: Never Used  . Alcohol use No  . Drug use: No  . Sexual activity: Not on file   Other Topics Concern  . Not on file   Social History Narrative   Lives at home with mother, sister, and son (age 57)   Caffeine use: 2 soda's per day       Outpatient Medications Prior to Visit  Medication Sig Dispense Refill  . Norgestimate-Ethinyl Estradiol Triphasic (TRI-PREVIFEM) 0.18/0.215/0.25 MG-35 MCG tablet Take 1 tablet by mouth daily.    . cyclobenzaprine (FLEXERIL) 5 MG tablet Take 1 tablet (5 mg total) by mouth 3 (three) times  daily as needed for muscle spasms. 30 tablet 0  . HYDROcodone-acetaminophen (NORCO) 10-325 MG tablet take 1 tablet by mouth twice a day if needed for pain  0  . meloxicam (MOBIC) 7.5 MG tablet take 1 tablet by mouth twice a day as needed for pain  0   Facility-Administered Medications Prior to Visit  Medication Dose Route Frequency Provider Last Rate Last Dose  . gadopentetate dimeglumine (MAGNEVIST) injection 14 mL  14 mL Intravenous Once PRN Melvenia Beam, MD        No Known Allergies  Review of Systems  Constitutional: Negative for fever and malaise/fatigue.  HENT: Negative for congestion.   Eyes: Negative for blurred vision.  Respiratory: Negative for cough and shortness of breath.   Cardiovascular: Negative for chest pain, palpitations and leg swelling.  Gastrointestinal: Negative for vomiting.  Genitourinary: Positive for dysuria and flank pain.  Musculoskeletal: Negative for  back pain.  Skin: Positive for rash.       Right upper leg  Neurological: Negative for loss of consciousness and headaches.       Objective:    Physical Exam  Constitutional: She is oriented to person, place, and time. She appears well-developed and well-nourished. No distress.  HENT:  Head: Normocephalic and atraumatic.  Eyes: Conjunctivae are normal.  Neck: Normal range of motion. No thyromegaly present.  Cardiovascular: Normal rate and regular rhythm.   Pulmonary/Chest: Effort normal and breath sounds normal. She has no wheezes.  Abdominal: Soft. Bowel sounds are normal. There is no tenderness.  Musculoskeletal: Normal range of motion. She exhibits no edema or deformity.  Neurological: She is alert and oriented to person, place, and time.  Skin: Skin is warm and dry. Rash noted. Rash is macular. She is not diaphoretic.     Psychiatric: She has a normal mood and affect.  Nursing note and vitals reviewed.   BP 100/76 (BP Location: Right Arm, Cuff Size: Normal)   Pulse 82   Temp 99.4 F (37.4 C) (Oral)   Resp 16   Ht 5\' 6"  (1.676 m)   Wt 150 lb 3.2 oz (68.1 kg)   LMP 10/18/2016 (Approximate)   SpO2 98%   BMI 24.24 kg/m  Wt Readings from Last 3 Encounters:  11/09/16 150 lb 3.2 oz (68.1 kg)  11/01/16 145 lb (65.8 kg)  10/05/16 147 lb (66.7 kg)   BP Readings from Last 3 Encounters:  11/09/16 100/76  11/01/16 94/60  10/05/16 97/69     Immunization History  Administered Date(s) Administered  . Influenza Whole 05/02/2007  . Influenza,inj,Quad PF,36+ Mos 05/02/2014  . Pneumococcal Polysaccharide-23 08/20/2013  . Td 06/14/2004    Health Maintenance  Topic Date Due  . TETANUS/TDAP  06/14/2014  . PAP SMEAR  06/15/2015  . INFLUENZA VACCINE  01/12/2017  . HIV Screening  Completed    Lab Results  Component Value Date   WBC 14.6 (H) 11/01/2016   HGB 12.6 11/01/2016   HCT 38.2 11/01/2016   PLT 251 11/01/2016   GLUCOSE 93 11/01/2016   ALT 9 (L) 11/01/2016   AST  20 11/01/2016   NA 138 11/01/2016   K 3.9 11/01/2016   CL 107 11/01/2016   CREATININE 0.69 11/01/2016   BUN 9 11/01/2016   CO2 25 11/01/2016   TSH 0.20 (L) 12/04/2009   INR 1.11 06/17/2014    Lab Results  Component Value Date   TSH 0.20 (L) 12/04/2009   Lab Results  Component Value Date   WBC 14.6 (H)  11/01/2016   HGB 12.6 11/01/2016   HCT 38.2 11/01/2016   MCV 86.0 11/01/2016   PLT 251 11/01/2016   Lab Results  Component Value Date   NA 138 11/01/2016   K 3.9 11/01/2016   CO2 25 11/01/2016   GLUCOSE 93 11/01/2016   BUN 9 11/01/2016   CREATININE 0.69 11/01/2016   BILITOT 0.1 (L) 11/01/2016   ALKPHOS 28 (L) 11/01/2016   AST 20 11/01/2016   ALT 9 (L) 11/01/2016   PROT 5.3 (L) 11/01/2016   ALBUMIN 3.2 (L) 11/01/2016   CALCIUM 8.1 (L) 11/01/2016   ANIONGAP 6 11/01/2016   GFR 105.09 03/20/2015   No results found for: CHOL No results found for: HDL No results found for: LDLCALC No results found for: TRIG No results found for: CHOLHDL No results found for: HGBA1C       Assessment & Plan:   Problem List Items Addressed This Visit      Unprioritized   Dysuria - Primary   Relevant Orders   POCT Urinalysis Dipstick (Automated) (Completed)   Urine culture   Ambulatory referral to Urology   Tinea corporis    If naftin not afforable ok to get lotrimen ultra otc      Relevant Medications   naftifine (NAFTIN) 1 % cream    Other Visit Diagnoses    Hematuria, unspecified type       Relevant Orders   Ambulatory referral to Urology   Hx of right flank pain       Relevant Medications   traMADol (ULTRAM) 50 MG tablet      I have discontinued Ms. Scinto's meloxicam, HYDROcodone-acetaminophen, and cyclobenzaprine. I am also having her start on naftifine and traMADol. Additionally, I am having her maintain her Norgestimate-Ethinyl Estradiol Triphasic.  Meds ordered this encounter  Medications  . naftifine (NAFTIN) 1 % cream    Sig: Apply topically daily.     Dispense:  30 g    Refill:  0  . traMADol (ULTRAM) 50 MG tablet    Sig: Take 1 tablet (50 mg total) by mouth every 6 (six) hours as needed.    Dispense:  20 tablet    Refill:  0    CMA served as scribe during this visit. History, Physical and Plan performed by medical provider. Documentation and orders reviewed and attested to.  Ann Held, DO

## 2016-11-09 NOTE — Assessment & Plan Note (Signed)
If naftin not afforable ok to get lotrimen ultra otc

## 2016-11-10 MED FILL — NORG-EE 0.18-0.215-0.25/0.0: 0.18/0.215/ | 28 days supply | Qty: 28 | Fill #8

## 2016-11-11 ENCOUNTER — Other Ambulatory Visit: Payer: Self-pay | Admitting: Family Medicine

## 2016-11-11 DIAGNOSIS — R109 Unspecified abdominal pain: Secondary | ICD-10-CM

## 2016-11-11 LAB — URINE CULTURE

## 2016-12-08 MED FILL — NORG-EE 0.18-0.215-0.25/0.0: 0.18/0.215/ | 28 days supply | Qty: 28 | Fill #9

## 2017-01-06 MED FILL — NORG-EE 0.18-0.215-0.25/0.0: 0.18/0.215/ | 28 days supply | Qty: 28 | Fill #10

## 2017-02-02 MED FILL — NORG-EE 0.18-0.215-0.25/0.0: 0.18/0.215/ | 28 days supply | Qty: 28 | Fill #11

## 2017-03-03 MED FILL — NORG-EE 0.18-0.215-0.25/0.0: 0.18/0.215/ | 28 days supply | Qty: 28 | Fill #12

## 2017-03-27 IMAGING — CT CT ABD-PELV W/ CM
2 of 4 series · 15 of 46 positions shown, 17 images · IV contrast (OMNIPAQUE 300)
Comparison: [DATE] [DATE], [DATE] ; [DATE] [DATE], [DATE]

CLINICAL DATA: Generalized abdominal pain with nausea. Low-grade
fever. Leukocytosis. History of interstitial cystitis

EXAM:
CT ABDOMEN AND PELVIS WITH CONTRAST
TECHNIQUE: Multidetector CT imaging of the abdomen and pelvis was performed
using the standard protocol following bolus administration of
intravenous contrast. Oral contrast was also administered.
CONTRAST:  100mL OMNIPAQUE IOHEXOL 300 MG/ML  SOLN

[Series 2: abd/pel with · axial · 0.74mm/px · z∈[-601,-176]mm · 12 of 97 slices shown, 14 images]
[im 8/97  soft-tissue]
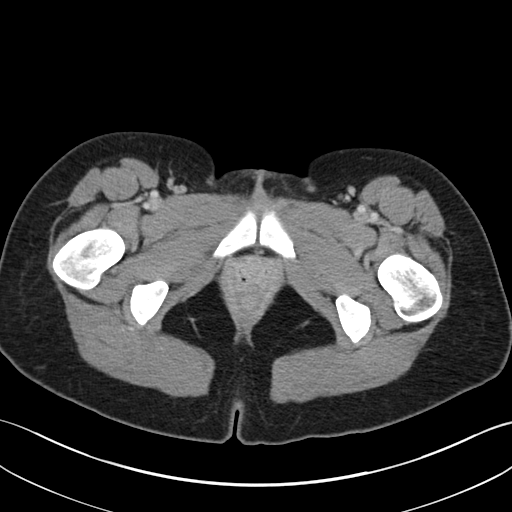
[im 8/97  bone]
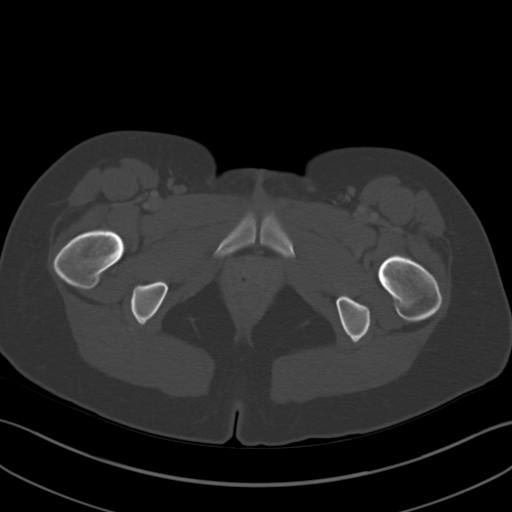
[im 16/97  soft-tissue]
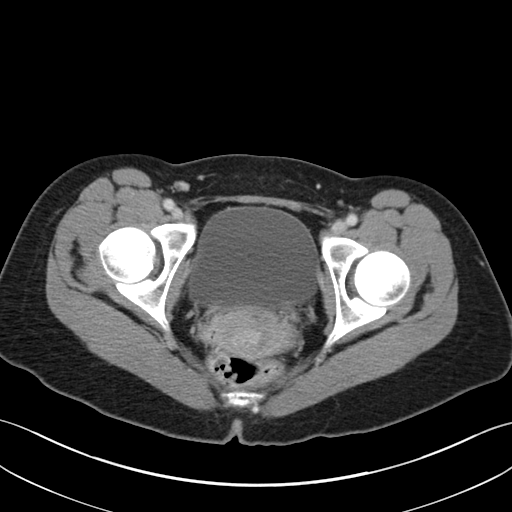
[im 24/97  soft-tissue]
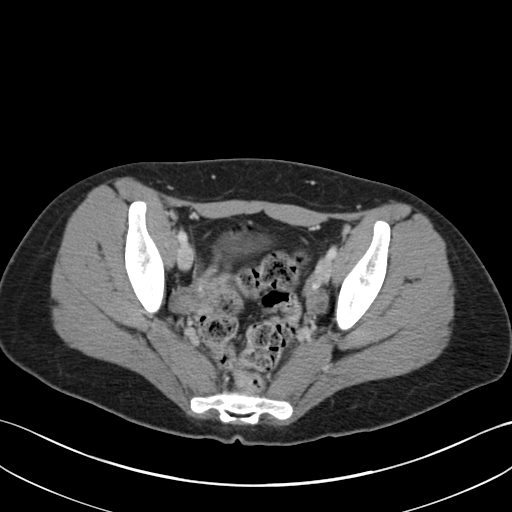
[im 31/97  soft-tissue]
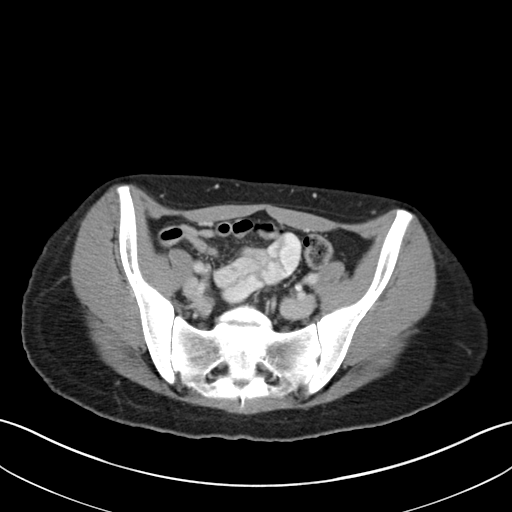
[im 39/97  soft-tissue]
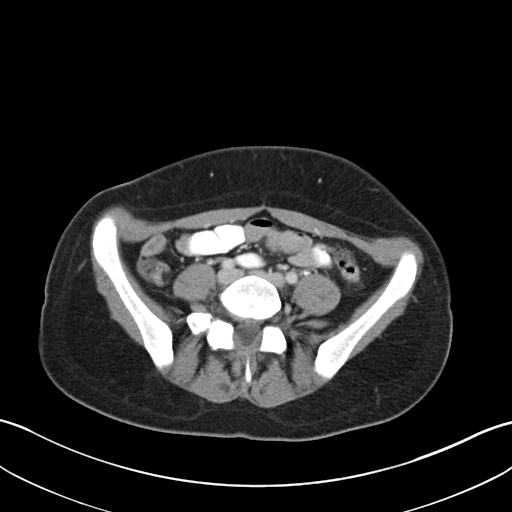
[im 47/97  soft-tissue]
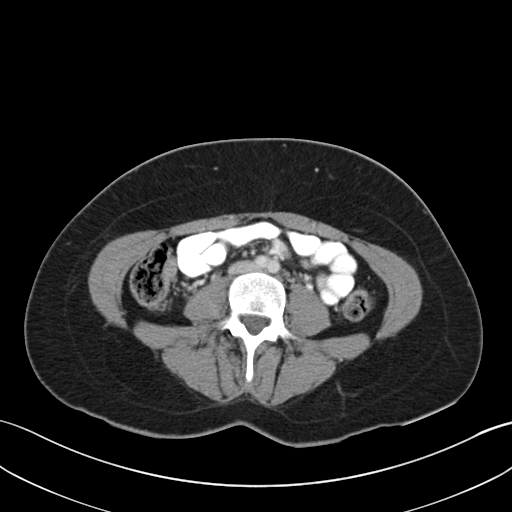
[im 54/97  soft-tissue]
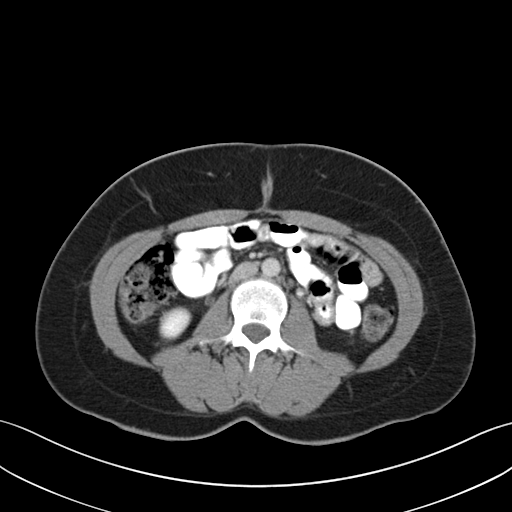
[im 62/97  soft-tissue]
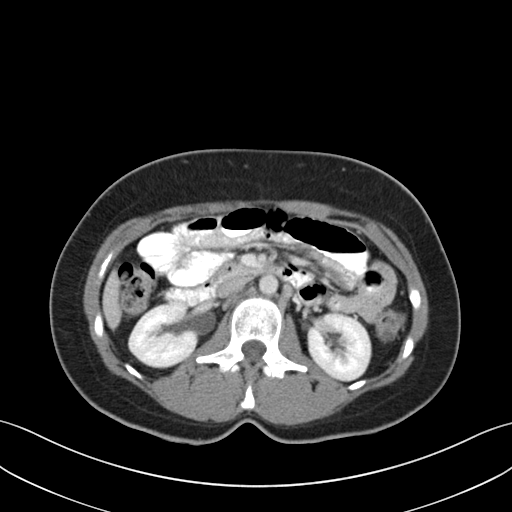
[im 70/97  soft-tissue]
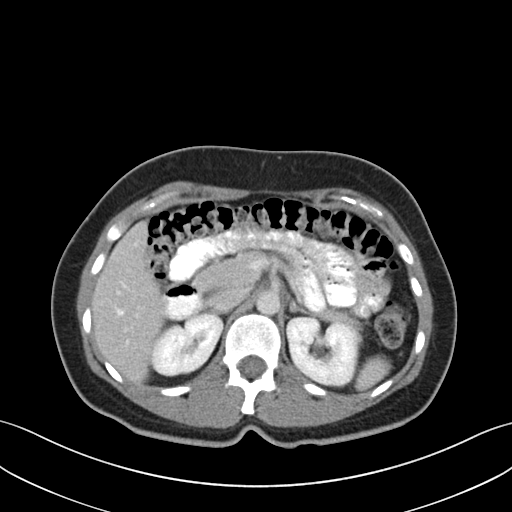
[im 70/97  bone]
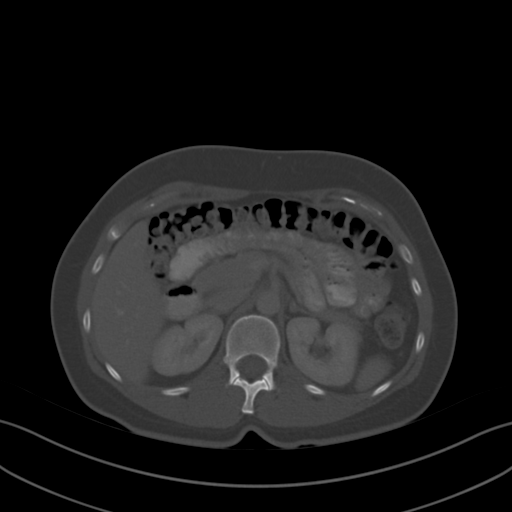
[im 77/97  soft-tissue]
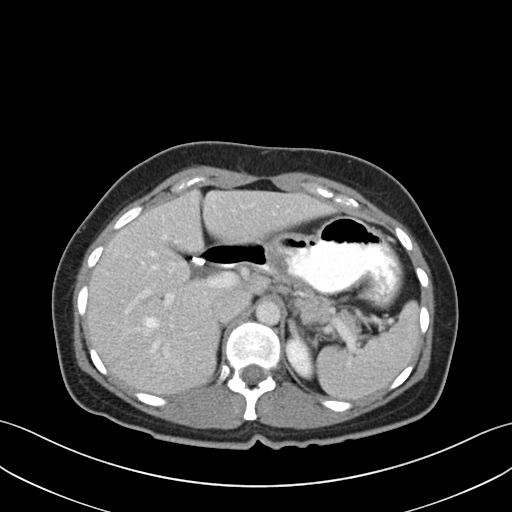
[im 85/97  soft-tissue]
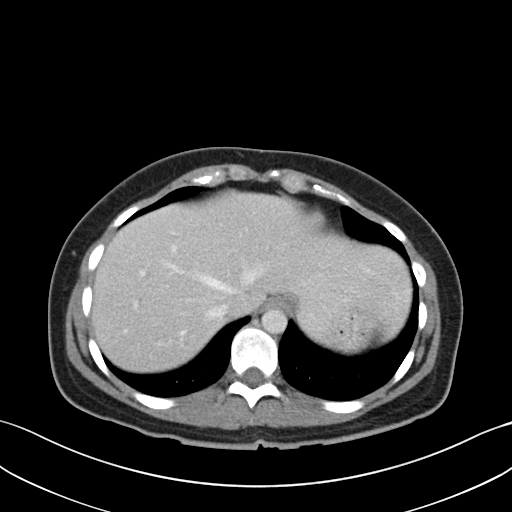
[im 93/97  soft-tissue]
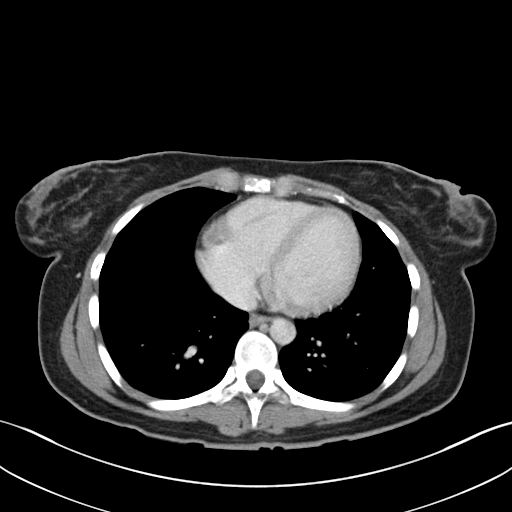

[Series 3: coronal a/|p · coronal · 0.74mm/px · 3 of 70 slices shown]
[im 24/70  soft-tissue]
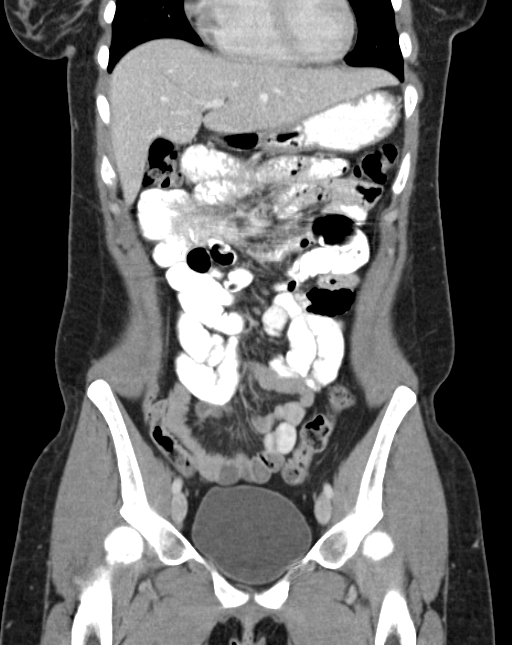
[im 31/70  soft-tissue]
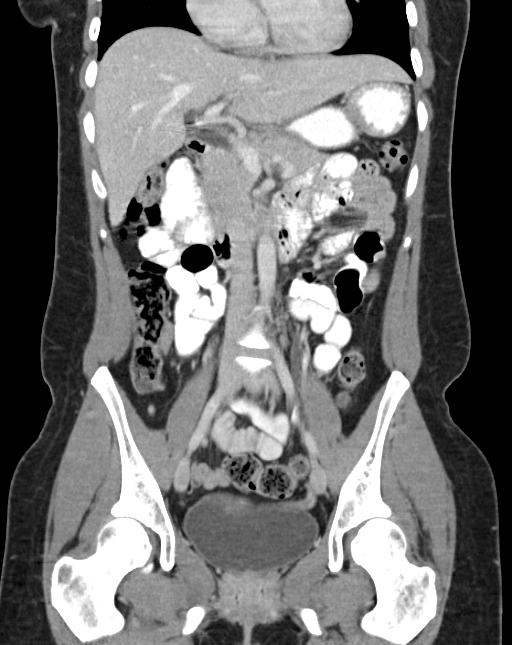
[im 39/70  soft-tissue]
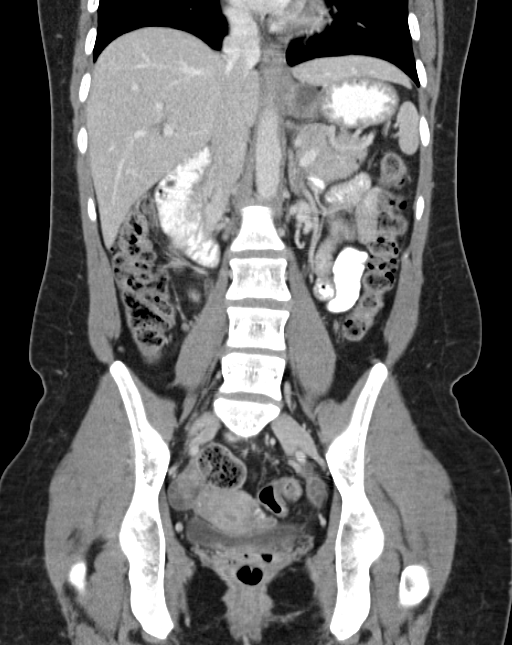

[15 of 46 positions shown; findings below may reference images not displayed]

FINDINGS: Lower chest:  Lung bases are clear.

Hepatobiliary: No focal liver lesions are identified. The
gallbladder is surgically absent. There is borderline prominence of
the intrahepatic biliary ducts, stable. Common bile duct measures 10
mm proximally with tapering to 7 mm distally. No mass or calculus is
appreciable in the biliary ductal system.

Pancreas: No mass or inflammatory focus.

Spleen: No appreciable splenic lesion.

Adrenals/Urinary Tract: Adrenals appear unremarkable bilaterally.
Kidneys bilaterally show no appreciable mass or hydronephrosis.
There is no renal or ureteral calculus either side. Urinary bladder
is midline with wall thickness within normal limits.

Stomach/Bowel: There is no bowel wall or mesenteric thickening. No
bowel obstruction. No free air or portal venous air.

Vascular/Lymphatic: There is no demonstrable abdominal aortic
aneurysm. No vascular lesions are appreciable. There is no
appreciable adenopathy in the abdomen or pelvis.

Reproductive: Uterus is anteverted. No pelvic mass or pelvic fluid
collection.

Other: Appendix appears normal. No ascites or abscess in the abdomen
or pelvis.

Musculoskeletal: There are no blastic or lytic bone lesions. No
intramuscular or abdominal wall lesion.
IMPRESSION: Gallbladder is absent. Mild biliary ductal prominence is felt to be
due to post cholecystectomy state. No biliary duct mass or calculus
appreciable.

The urinary bladder is midline with normal wall thickness. No renal
or ureteral calculi. There is no appreciable hydronephrosis.
Collecting systems appear symmetric bilaterally. There is normal and
symmetric renal enhancement bilaterally.

No bowel obstruction. No bowel wall thickening. Appendix appears
normal. No abscess.

## 2017-03-30 MED FILL — NORG-EE 0.18-0.215-0.25/0.0: 0.18/0.215/ | 28 days supply | Qty: 28 | Fill #0

## 2017-04-25 MED FILL — NORG-EE 0.18-0.215-0.25/0.0: 0.18/0.215/ | 28 days supply | Qty: 28 | Fill #0

## 2017-05-16 MED FILL — NORG-EE 0.18-0.215-0.25/0.0: 0.18/0.215/ | 84 days supply | Qty: 84 | Fill #0
